# Patient Record
Sex: Male | Born: 1950
Health system: Southern US, Community
[De-identification: ages and names within clinical notes are randomized; demographics above are authoritative.]

## PROBLEM LIST (undated history)

## (undated) DIAGNOSIS — K5903 Drug induced constipation: Secondary | ICD-10-CM

## (undated) DIAGNOSIS — T402X5A Adverse effect of other opioids, initial encounter: Secondary | ICD-10-CM

## (undated) DIAGNOSIS — G4733 Obstructive sleep apnea (adult) (pediatric): Secondary | ICD-10-CM

## (undated) DIAGNOSIS — K219 Gastro-esophageal reflux disease without esophagitis: Secondary | ICD-10-CM

## (undated) DIAGNOSIS — I10 Essential (primary) hypertension: Secondary | ICD-10-CM

## (undated) DIAGNOSIS — M71129 Other infective bursitis, unspecified elbow: Secondary | ICD-10-CM

## (undated) DIAGNOSIS — R269 Unspecified abnormalities of gait and mobility: Secondary | ICD-10-CM

## (undated) DIAGNOSIS — I4891 Unspecified atrial fibrillation: Secondary | ICD-10-CM

## (undated) DIAGNOSIS — M1711 Unilateral primary osteoarthritis, right knee: Secondary | ICD-10-CM

## (undated) DIAGNOSIS — E7800 Pure hypercholesterolemia, unspecified: Secondary | ICD-10-CM

## (undated) DIAGNOSIS — J439 Emphysema, unspecified: Secondary | ICD-10-CM

## (undated) HISTORY — PX: MULTIPLE TOOTH EXTRACTIONS: SHX2053

## (undated) HISTORY — PX: KNEE SURGERY: SHX244

## (undated) HISTORY — PX: EYE SURGERY: SHX253

## (undated) HISTORY — PX: SHOULDER ARTHROSCOPY: SHX128

## (undated) HISTORY — DX: Gastro-esophageal reflux disease without esophagitis: K21.9

## (undated) HISTORY — DX: Unspecified abnormalities of gait and mobility: R26.9

## (undated) HISTORY — DX: Emphysema, unspecified: J43.9

## (undated) HISTORY — PX: COLONOSCOPY W/ BIOPSIES AND POLYPECTOMY: SHX1376

## (undated) HISTORY — DX: Obstructive sleep apnea (adult) (pediatric): G47.33

## (undated) HISTORY — DX: Pure hypercholesterolemia, unspecified: E78.00

## (undated) HISTORY — DX: Essential (primary) hypertension: I10

---

## 1998-01-22 ENCOUNTER — Ambulatory Visit: Admission: RE | Admit: 1998-01-22 | Discharge: 1998-01-22 | Payer: Self-pay | Admitting: Family Medicine

## 1999-06-05 ENCOUNTER — Emergency Department (HOSPITAL_COMMUNITY): Admission: EM | Admit: 1999-06-05 | Discharge: 1999-06-05 | Payer: Self-pay | Admitting: Internal Medicine

## 2003-01-14 ENCOUNTER — Ambulatory Visit (HOSPITAL_COMMUNITY): Admission: RE | Admit: 2003-01-14 | Discharge: 2003-01-14 | Payer: Self-pay | Admitting: Orthopedic Surgery

## 2003-01-14 ENCOUNTER — Encounter (INDEPENDENT_AMBULATORY_CARE_PROVIDER_SITE_OTHER): Payer: Self-pay | Admitting: Specialist

## 2003-01-14 ENCOUNTER — Encounter: Payer: Self-pay | Admitting: Orthopedic Surgery

## 2003-09-07 ENCOUNTER — Ambulatory Visit (HOSPITAL_COMMUNITY): Admission: RE | Admit: 2003-09-07 | Discharge: 2003-09-07 | Payer: Self-pay | Admitting: Gastroenterology

## 2003-09-07 ENCOUNTER — Encounter (INDEPENDENT_AMBULATORY_CARE_PROVIDER_SITE_OTHER): Payer: Self-pay | Admitting: Specialist

## 2006-04-03 ENCOUNTER — Ambulatory Visit: Payer: Self-pay | Admitting: Internal Medicine

## 2006-04-05 ENCOUNTER — Ambulatory Visit: Payer: Self-pay

## 2006-04-05 ENCOUNTER — Encounter: Payer: Self-pay | Admitting: Cardiology

## 2006-05-11 ENCOUNTER — Ambulatory Visit: Payer: Self-pay | Admitting: Cardiology

## 2006-05-22 ENCOUNTER — Ambulatory Visit: Payer: Self-pay | Admitting: Internal Medicine

## 2006-05-24 ENCOUNTER — Ambulatory Visit (HOSPITAL_COMMUNITY): Admission: RE | Admit: 2006-05-24 | Discharge: 2006-05-24 | Payer: Self-pay | Admitting: Internal Medicine

## 2006-07-14 ENCOUNTER — Ambulatory Visit: Payer: Self-pay | Admitting: Internal Medicine

## 2006-07-14 ENCOUNTER — Observation Stay (HOSPITAL_COMMUNITY): Admission: EM | Admit: 2006-07-14 | Discharge: 2006-07-15 | Payer: Self-pay | Admitting: Emergency Medicine

## 2010-04-13 ENCOUNTER — Encounter: Admission: RE | Admit: 2010-04-13 | Discharge: 2010-04-13 | Payer: Self-pay | Admitting: Family Medicine

## 2010-06-03 ENCOUNTER — Encounter: Admission: RE | Admit: 2010-06-03 | Discharge: 2010-06-03 | Payer: Self-pay | Admitting: Orthopedic Surgery

## 2010-07-23 HISTORY — PX: HERNIA REPAIR: SHX51

## 2010-08-11 ENCOUNTER — Inpatient Hospital Stay (HOSPITAL_COMMUNITY): Admission: EM | Admit: 2010-08-11 | Discharge: 2010-08-12 | Payer: Self-pay | Admitting: Emergency Medicine

## 2010-08-23 HISTORY — PX: TOTAL HIP ARTHROPLASTY: SHX124

## 2010-08-29 ENCOUNTER — Inpatient Hospital Stay (HOSPITAL_COMMUNITY): Admission: RE | Admit: 2010-08-29 | Discharge: 2010-08-31 | Payer: Self-pay | Admitting: Orthopedic Surgery

## 2011-01-03 LAB — COMPREHENSIVE METABOLIC PANEL
ALT: 34 U/L (ref 0–53)
AST: 21 U/L (ref 0–37)
Albumin: 3.7 g/dL (ref 3.5–5.2)
Alkaline Phosphatase: 142 U/L — ABNORMAL HIGH (ref 39–117)
BUN: 14 mg/dL (ref 6–23)
CO2: 29 mEq/L (ref 19–32)
Calcium: 9.2 mg/dL (ref 8.4–10.5)
Chloride: 102 mEq/L (ref 96–112)
Creatinine, Ser: 0.83 mg/dL (ref 0.4–1.5)
GFR calc Af Amer: >60 mL/min (ref >60)
GFR calc non Af Amer: >60 mL/min (ref >60)
Glucose, Bld: 112 mg/dL — ABNORMAL HIGH (ref 70–99)
Potassium: 4.2 mEq/L (ref 3.5–5.1)
Sodium: 138 mEq/L (ref 135–145)
Total Bilirubin: 0.7 mg/dL (ref 0.3–1.2)
Total Protein: 6.6 g/dL (ref 6.0–8.3)

## 2011-01-03 LAB — URINE MICROSCOPIC-ADD ON

## 2011-01-03 LAB — CBC
HCT: 32.2 % — ABNORMAL LOW (ref 39.0–52.0)
HCT: 36.1 % — ABNORMAL LOW (ref 39.0–52.0)
HCT: 39.6 % (ref 39.0–52.0)
Hemoglobin: 10.5 g/dL — ABNORMAL LOW (ref 13.0–17.0)
Hemoglobin: 11.7 g/dL — ABNORMAL LOW (ref 13.0–17.0)
Hemoglobin: 13.4 g/dL (ref 13.0–17.0)
MCH: 31 pg (ref 26.0–34.0)
MCH: 31 pg (ref 26.0–34.0)
MCH: 32.1 pg (ref 26.0–34.0)
MCHC: 32.4 g/dL (ref 30.0–36.0)
MCHC: 32.6 g/dL (ref 30.0–36.0)
MCHC: 33.8 g/dL (ref 30.0–36.0)
MCV: 94.7 fL (ref 78.0–100.0)
MCV: 95 fL (ref 78.0–100.0)
MCV: 95.8 fL (ref 78.0–100.0)
Platelets: 276 10*3/uL (ref 150–400)
Platelets: 299 10*3/uL (ref 150–400)
Platelets: 346 10*3/uL (ref 150–400)
RBC: 3.39 MIL/uL — ABNORMAL LOW (ref 4.22–5.81)
RBC: 3.77 MIL/uL — ABNORMAL LOW (ref 4.22–5.81)
RBC: 4.18 MIL/uL — ABNORMAL LOW (ref 4.22–5.81)
RDW: 13.1 % (ref 11.5–15.5)
RDW: 13.4 % (ref 11.5–15.5)
RDW: 13.5 % (ref 11.5–15.5)
WBC: 10.7 10*3/uL — ABNORMAL HIGH (ref 4.0–10.5)
WBC: 13.9 10*3/uL — ABNORMAL HIGH (ref 4.0–10.5)
WBC: 14.6 10*3/uL — ABNORMAL HIGH (ref 4.0–10.5)

## 2011-01-03 LAB — URINE CULTURE
Colony Count: NO GROWTH
Colony Count: NO GROWTH
Culture  Setup Time: 201111012156
Culture  Setup Time: 201111081650
Culture: NO GROWTH
Culture: NO GROWTH

## 2011-01-03 LAB — DIFFERENTIAL
Basophils Absolute: 0 10*3/uL (ref 0.0–0.1)
Basophils Absolute: 0.1 10*3/uL (ref 0.0–0.1)
Basophils Relative: 0 % (ref 0–1)
Basophils Relative: 1 % (ref 0–1)
Eosinophils Absolute: 0 10*3/uL (ref 0.0–0.7)
Eosinophils Absolute: 0.2 10*3/uL (ref 0.0–0.7)
Eosinophils Relative: 0 % (ref 0–5)
Eosinophils Relative: 2 % (ref 0–5)
Lymphocytes Relative: 11 % — ABNORMAL LOW (ref 12–46)
Lymphocytes Relative: 25 % (ref 12–46)
Lymphs Abs: 1.5 10*3/uL (ref 0.7–4.0)
Lymphs Abs: 2.7 10*3/uL (ref 0.7–4.0)
Monocytes Absolute: 0.7 10*3/uL (ref 0.1–1.0)
Monocytes Absolute: 1.7 10*3/uL — ABNORMAL HIGH (ref 0.1–1.0)
Monocytes Relative: 12 % (ref 3–12)
Monocytes Relative: 7 % (ref 3–12)
Neutro Abs: 10.7 10*3/uL — ABNORMAL HIGH (ref 1.7–7.7)
Neutro Abs: 7 10*3/uL (ref 1.7–7.7)
Neutrophils Relative %: 66 % (ref 43–77)
Neutrophils Relative %: 77 % (ref 43–77)

## 2011-01-03 LAB — BASIC METABOLIC PANEL
BUN: 10 mg/dL (ref 6–23)
BUN: 18 mg/dL (ref 6–23)
CO2: 27 mEq/L (ref 19–32)
CO2: 29 mEq/L (ref 19–32)
Calcium: 8.4 mg/dL (ref 8.4–10.5)
Calcium: 8.6 mg/dL (ref 8.4–10.5)
Chloride: 101 mEq/L (ref 96–112)
Chloride: 103 mEq/L (ref 96–112)
Creatinine, Ser: 0.77 mg/dL (ref 0.4–1.5)
Creatinine, Ser: 1.22 mg/dL (ref 0.4–1.5)
GFR calc Af Amer: >60 mL/min (ref >60)
GFR calc Af Amer: >60 mL/min (ref >60)
GFR calc non Af Amer: >60 mL/min (ref >60)
GFR calc non Af Amer: >60 mL/min (ref >60)
Glucose, Bld: 139 mg/dL — ABNORMAL HIGH (ref 70–99)
Glucose, Bld: 142 mg/dL — ABNORMAL HIGH (ref 70–99)
Potassium: 3.9 mEq/L (ref 3.5–5.1)
Potassium: 4 mEq/L (ref 3.5–5.1)
Sodium: 138 mEq/L (ref 135–145)
Sodium: 138 mEq/L (ref 135–145)

## 2011-01-03 LAB — URINALYSIS, ROUTINE W REFLEX MICROSCOPIC
Glucose, UA: NEGATIVE mg/dL
Glucose, UA: NEGATIVE mg/dL
Hgb urine dipstick: NEGATIVE
Hgb urine dipstick: NEGATIVE
Ketones, ur: NEGATIVE mg/dL
Ketones, ur: NEGATIVE mg/dL
Leukocytes, UA: NEGATIVE
Nitrite: NEGATIVE
Nitrite: NEGATIVE
Protein, ur: 30 mg/dL — AB
Protein, ur: NEGATIVE mg/dL
Specific Gravity, Urine: 1.026 (ref 1.005–1.030)
Specific Gravity, Urine: 1.027 (ref 1.005–1.030)
Urobilinogen, UA: 1 mg/dL (ref 0.0–1.0)
Urobilinogen, UA: 1 mg/dL (ref 0.0–1.0)
pH: 5.5 (ref 5.0–8.0)
pH: 6 (ref 5.0–8.0)

## 2011-01-03 LAB — SURGICAL PCR SCREEN
MRSA, PCR: NEGATIVE
Staphylococcus aureus: NEGATIVE

## 2011-01-03 LAB — TYPE AND SCREEN
ABO/RH(D): A POS
Antibody Screen: NEGATIVE

## 2011-01-03 LAB — PROTIME-INR
INR: 0.96 (ref 0.00–1.49)
Prothrombin Time: 13 seconds (ref 11.6–15.2)

## 2011-01-03 LAB — ABO/RH: ABO/RH(D): A POS

## 2011-01-03 LAB — APTT: aPTT: 34 seconds (ref 24–37)

## 2011-01-04 LAB — DIFFERENTIAL
Basophils Absolute: 0.1 10*3/uL (ref 0.0–0.1)
Basophils Relative: 1 % (ref 0–1)
Eosinophils Absolute: 0.1 10*3/uL (ref 0.0–0.7)
Eosinophils Relative: 1 % (ref 0–5)
Lymphocytes Relative: 19 % (ref 12–46)
Lymphs Abs: 1.8 10*3/uL (ref 0.7–4.0)
Monocytes Absolute: 0.6 10*3/uL (ref 0.1–1.0)
Monocytes Relative: 7 % (ref 3–12)
Neutro Abs: 6.8 10*3/uL (ref 1.7–7.7)
Neutrophils Relative %: 72 % (ref 43–77)

## 2011-01-04 LAB — COMPREHENSIVE METABOLIC PANEL
ALT: 51 U/L (ref 0–53)
AST: 22 U/L (ref 0–37)
Albumin: 3.6 g/dL (ref 3.5–5.2)
Alkaline Phosphatase: 110 U/L (ref 39–117)
BUN: 19 mg/dL (ref 6–23)
CO2: 28 mEq/L (ref 19–32)
Calcium: 9.2 mg/dL (ref 8.4–10.5)
Chloride: 106 mEq/L (ref 96–112)
Creatinine, Ser: 0.75 mg/dL (ref 0.4–1.5)
GFR calc Af Amer: >60 mL/min (ref >60)
GFR calc non Af Amer: >60 mL/min (ref >60)
Glucose, Bld: 102 mg/dL — ABNORMAL HIGH (ref 70–99)
Potassium: 3.9 mEq/L (ref 3.5–5.1)
Sodium: 141 mEq/L (ref 135–145)
Total Bilirubin: 0.8 mg/dL (ref 0.3–1.2)
Total Protein: 7 g/dL (ref 6.0–8.3)

## 2011-01-04 LAB — BASIC METABOLIC PANEL
BUN: 20 mg/dL (ref 6–23)
CO2: 29 mEq/L (ref 19–32)
Calcium: 8.4 mg/dL (ref 8.4–10.5)
Chloride: 105 mEq/L (ref 96–112)
Creatinine, Ser: 0.88 mg/dL (ref 0.4–1.5)
GFR calc Af Amer: >60 mL/min (ref >60)
GFR calc non Af Amer: >60 mL/min (ref >60)
Glucose, Bld: 98 mg/dL (ref 70–99)
Potassium: 3.7 mEq/L (ref 3.5–5.1)
Sodium: 138 mEq/L (ref 135–145)

## 2011-01-04 LAB — CBC
HCT: 34.8 % — ABNORMAL LOW (ref 39.0–52.0)
HCT: 39.5 % (ref 39.0–52.0)
Hemoglobin: 12 g/dL — ABNORMAL LOW (ref 13.0–17.0)
Hemoglobin: 13.9 g/dL (ref 13.0–17.0)
MCH: 32.5 pg (ref 26.0–34.0)
MCH: 32.7 pg (ref 26.0–34.0)
MCHC: 34.6 g/dL (ref 30.0–36.0)
MCHC: 35.2 g/dL (ref 30.0–36.0)
MCV: 92.9 fL (ref 78.0–100.0)
MCV: 94 fL (ref 78.0–100.0)
Platelets: 222 10*3/uL (ref 150–400)
Platelets: 260 10*3/uL (ref 150–400)
RBC: 3.7 MIL/uL — ABNORMAL LOW (ref 4.22–5.81)
RBC: 4.25 MIL/uL (ref 4.22–5.81)
RDW: 13 % (ref 11.5–15.5)
RDW: 13.1 % (ref 11.5–15.5)
WBC: 8.9 10*3/uL (ref 4.0–10.5)
WBC: 9.3 10*3/uL (ref 4.0–10.5)

## 2011-03-10 NOTE — Assessment & Plan Note (Signed)
[facility]                              CARDIOLOGY OFFICE NOTE   Richard, Lynch                      MRN:          [id number]  DATE:05/11/2006                            DOB:          01-13-1951    SUBJECTIVE:  [name] is a very pleasant 60 year old male patient  initially seen by Dr. Gala Lynch on April 03, 2006, with complaints of  palpitation.  The patient has had an extensive workup since he saw Dr.  Gala Lynch June 12.  He had an echocardiogram done June 14 that was normal.  He had an EF of 55%.  No significant valvular abnormalities.  He also had  stress Myoview study.  He walked for 11-1/2 minutes, and there was no  ischemia on his nuclear images.  He also had an event monitor that had no  significant arrhythmias noted.  He recently had another episode of  palpitations this past weekend, and he saw Dr. Foy Lynch on Monday.  Serum  catecholamines were drawn, and these were normal.   The patient describes episode of palpitations this past weekend that lasted  about an hour when he was pretty much at rest.  He felt that his heart was  going fast, but he did not check his pulse.  He denies any syncope.  He was  somewhat lightheaded.  His blood pressure was checked and it was elevated in  the 150's systolically.  He denies any chest pain or shortness of breath.  He noted another episode later that day after eating.  When he saw Dr.  Foy Lynch, he was placed on Toprol 25 mg daily.  He has not had any further  episodes of palpitations, but has noted several episodes of lightheadedness  that he describes as near syncope.  These are symptoms that last only  briefly, and he feels a tingling sensation across his upper chest and arms.  He notes that eating usually makes the symptoms worse.  He denies any  fevers, chills, melena, hematochezia, hematuria, nausea, vomiting, diarrhea,  rashes, myalgias, arthralgias.   MEDICATIONS:  1.  Aspirin 81 mg  daily.  2.  Lipitor 20 mg daily.  3.  Multivitamin daily.  4.  Vitamin E daily.  5.  Toprol XL 50 mg half tablet daily.   ALLERGIES:  No known drug allergies.   PHYSICAL EXAMINATION:  GENERAL APPEARANCE:  Well-nourished, well-developed  male in no acute distress.  VITAL SIGNS:  Blood pressure lying is 133/80, pulse 78 sitting, 134/80 with  pulse of 70 standing, 139/80 with pulse of 78 every 2 minutes; 132/81, pulse  79, every 5 minutes; 130/82, pulse 81.  HEENT:  Unremarkable.  NECK:  Without JVD.  CARDIAC:  Normal S1, S2.  Regular rate and rhythm without murmurs, clicks,  rubs or gallops.  LUNGS:  Clear to auscultation without wheezes, rales or rhonchi.  ABDOMEN:  Soft, nontender with normoactive bowel sounds.  No organomegaly.  No rebound or guarding.  EXTREMITIES:  Without edema.  Calves are soft, nontender, without palpable  cords.  NEUROLOGICAL:  Nonfocal.   STUDIES:  Electrocardiogram revealed  sinus rhythm with a heart rate of 78,  normal axis.  No acute changes.   IMPRESSION:  1.  Palpitation and presyncope.      1.  Normal LV function.      2.  Nonischemic stress Myoview June 2007.      3.  No significant arrhythmia by recent event monitor.  2.  Hypertension.  3.  Treated dyslipidemia.  4.  Tobacco abuse.   FAMILY HISTORY:  Coronary disease.   PLAN:  The patient presents to the office today again with episodes of  palpitations.  Some of these symptoms are somewhat vague.  He did have two  episodes of palpitations that seem to come on with eating this past weekend.  It does not sound as though he has hyperglycemic episodes as eating makes  his symptoms worse.  He does have a lot of belching with some of the  symptoms that he describes, but no indigestion, water brash symptoms or  belching throughout the rest of the day.  He had recent serum catecholamines  drawn and these were normal.  He also had a TSH done and this was normal.  To this point, his workup has been  fairly unremarkable.  His exam is benign.  I think, at this point in time, we should increase his Toprol to 50 mg daily  to see if he can tolerate that and have less symptoms.  If he continues to  have symptoms, we can certainly try to put him on a pro time pump inhibitor  as he does describe some belching.  We are going to hold off on the proton  pump inhibitor at this point in time.  The patient sees Dr. Gala Lynch in  about one week's time, and she should keep that appointment.  He knows to go  to the emergency room or come to our office directly if he should have  another episode of these palpitations that he has described.                                  Tereso Newcomer, PA-C    SW/MedQ  DD:  05/11/2006  DT:  05/11/2006  Job #:  [id number]   cc:   Richard Maduro L. Richard Guadalajara, MD

## 2011-03-10 NOTE — Op Note (Signed)
   NAME:  Richard Lynch, Richard Lynch                         ACCOUNT NO.:  [id number]   MEDICAL RECORD NO.:  [id number]                   PATIENT TYPE:  AMB   LOCATION:  DAY                                  FACILITY:  [facility]   PHYSICIAN:  Marlowe Kays, M.D.               DATE OF BIRTH:  [birth year]   DATE OF PROCEDURE:  01/14/2003  DATE OF DISCHARGE:                                 OPERATIVE REPORT   PREOPERATIVE DIAGNOSIS:  Chronic olecranon bursitis, right elbow.   POSTOPERATIVE DIAGNOSIS:  Chronic olecranon bursitis, right elbow.   PROCEDURE:  Excision of olecranon bursa, right elbow.   SURGEON:  Marlowe Kays, M.D.   ASSISTANT:  Nurse.   ANESTHESIA:  General.   PATHOLOGY AND JUSTIFICATION FOR PROCEDURE:  Roughly a year ago he struck the  right olecranon on a door jamb and since that time has had chronic pain and  swelling and has noted nodularity in the right olecranon bursa.  He has had  one aspiration and steroid injection with no improvement.  Accordingly, he  is here today for surgery.   DESCRIPTION OF PROCEDURE:  After satisfactory general anesthesia, a neck  tourniquet applied.  The right elbow was prepped with Duraprep from wrist to  tourniquet and draped in a sterile field.  The arm was placed across his  chest.  A curved incision was made around the olecranon on the radial side.  On opening the joint, a large amount of fluid was present plus a large  amount of ropy, fibrotic material, some of which was intimately attached to  the undersurface of the skin.  I excised all his olecranon bursa with a  combination of knife and scissors dissection, and the nodules which were  adherent to the underneath surface of the skin I dissected off with a 15  knife blade.  The specimen was sent to pathology.  I then released the  tourniquet and the bleeders were coagulated.  The wound was dry on closure.  I did not feel he needed a drain.  I irrigated with sterile saline, and the  skin  and subcutaneous tissue were only closed with interrupted 4-0 nylon  interrupted stitches.  Betadine and Adaptic compressive dressing were  applied.  He tolerated the procedure well and was taken to the recovery room  in satisfactory condition with no known complications.                                                Marlowe Kays, M.D.    JA/MEDQ  D:  01/14/2003  T:  01/15/2003  Job:  [id number]

## 2011-03-10 NOTE — H&P (Signed)
NAME:  Richard Lynch, Richard Lynch               ACCOUNT NO.:  [id number]   MEDICAL RECORD NO.:  [id number]          PATIENT TYPE:  OBV   LOCATION:  3733                         FACILITY:  [facility]   PHYSICIAN:  Doylene Canning. Ladona Ridgel, MD    DATE OF BIRTH:  20-Apr-1951   DATE OF ADMISSION:  07/14/2006  DATE OF DISCHARGE:  07/15/2006                                HISTORY & PHYSICAL   ADMITTING DIAGNOSES:  1. Palpitations.  2. Chest pain.   HISTORY OF PRESENT ILLNESS:  The patient is a 60 year old man who has been  followed by Dr. Nicholes Lynch.  He has had palpitations which have been  present since June or July and has undergone extensive workup.  He wore a 30-  day cardiac monitor, by his report, which was unremarkable.  He has had  extensive test including stress testing, MRI scanning and most recently,  ultrasound of the abdominal and by his report, all of these have been  negative.  The patient was in his usual state of health until this morning,  when he was awakened from sleep by the sensation of rapid heart racing  associated with chest pressure and shortness of breath.  He felt warm and  hot throughout his chest..  He took a beta blocker and came to the emergency  room.  In the ambulance, he was in sinus rhythm, based on an EKG with no  significant ST-T abnormalities.  His subsequent EKGs have been all  unremarkable.  He is subsequently referred for additional evaluation and  treatment.  The patient has never had syncope.  He denies cough or  hemoptysis.  He does feel generalized fatigue, particularly after one of his  episodes of palpitations.  His chest pain does not radiate to the arm or  neck.   SOCIAL HISTORY:  The patient has history as heavy tobacco use, but now  smokes less than a half a pack of cigarettes daily, prior to this, 1 to 1-  1/2 packs per day.  He drinks 2-4 alcoholic beverage daily.  He is married.   FAMILY HISTORY:  Notable for both parents being alive, both with heart  problems in their mid 66s.   REVIEW OF SYSTEMS:  As noted in the HPI.  The rest of the review of systems  was negative.   MEDICATIONS:  Include Toprol and Lipitor.   PHYSICAL EXAMINATION:  GENERAL:  He is a pleasant, well-appearing, middle-  aged man in no acute distress.  VITAL SIGNS:  The blood pressure today was 117/80.  The pulse was 78 and  regular.  The respirations were 18.  HEENT:  Normocephalic and atraumatic.  Pupils are equal and round.  The oropharynx is moist.  Sclerae were  anicteric.  NECK:  No jugular distention.  There was no thyromegaly.  Trachea was  midline.  The carotids were 2+ symmetric.  LUNGS:  Clear bilaterally to  auscultation.  There were no wheezes, rales or rhonchi.  There was no  increased work of breathing.  CARDIOVASCULAR:  Exam reveals a regular rate and rhythm with normal S1 and  S2.  There were no murmurs, rubs or gallops.  The PMI was not enlarged, nor  was it laterally displaced.  ABDOMEN:  Soft and nontender.  There was no organomegaly.  The bowel sounds  were present.  There was no rebound or guarding.  EXTREMITIES:  Demonstrated no cyanosis, clubbing or edema.  The pulses were  2+ and symmetric.Marland Kitchen  SKIN:  Exam was normal.  JOINTS:  Exam was normal.  NEUROLOGIC:  Alert and oriented x3 with nerves intact.  His strength was 5/5  and symmetric.   LABORATORY AND ACCESSORY CLINICAL DATA:  His EKG demonstrates sinus rhythm  with normal axis and left atrial enlargement.   IMPRESSION:  1. Palpitations.  2. Atypical chest pain.  3. Dyslipidemia.   DISCUSSION:  The etiology of Richard Lynch symptoms are still unclear to me.  I plan to admit him to the hospital and obtain serial cardiac enzymes  secondary to his chest pain, though this is atypical.  He has some evidence  of anxiety; however, I think that his symptoms that have awakened him from  sleep goes against this purely being anxiety.  Most likely he either has SVT  or atrial fibrillation as  a mechanism of his underlying palpitations.  The  patient has worn a 30-day cardiac monitor in the past, but his symptoms have  increased in frequency and severity since then and we may have him repeat  this or have him alternatively wear a 48-hour Holter if nothing shows up on  telemetry while he is in the hospital.           ______________________________  Doylene Canning. Ladona Ridgel, MD     GWT/MEDQ  D:  07/14/2006  T:  07/17/2006  Job:  [id number]   cc:   Bevelyn Buckles. Bensimhon, MD

## 2011-03-10 NOTE — Op Note (Signed)
   NAME:  Richard Lynch, Richard Lynch                         ACCOUNT NO.:  [id number]   MEDICAL RECORD NO.:  [id number]                   PATIENT TYPE:  AMB   LOCATION:  ENDO                                 FACILITY:  [facility]   PHYSICIAN:  Petra Kuba, M.D.                 DATE OF BIRTH:  02/27/1951   DATE OF PROCEDURE:  09/07/2003  DATE OF DISCHARGE:                                 OPERATIVE REPORT   PROCEDURE PERFORMED:  Colonoscopy with polypectomy.   ENDOSCOPIST:  Petra Kuba, M.D.   INDICATIONS FOR PROCEDURE:  Patient with family history of colon cancer due  for screening.  Consent was signed after the risks, benefits, methods and  options were thoroughly discussed in the office.   MEDICINES USED:  Demerol 50 mg, Versed 5 mg.   DESCRIPTION OF PROCEDURE:  Rectal inspection was pertinent for external  hemorrhoids, small.  Digital exam was negative.  A video pediatric  adjustable colonoscope was inserted and easily advanced around the colon to  the cecum.  This did not require any abdominal pressure or any position  changes.  No abnormality seen on insertion.  The cecum was identified by the  appendiceal orifice and the ileocecal valve.  The prep was adequate.  There  was some liquid stool that required washing and suctioning.  On slow  withdrawal through the colon, two tiny polyps were seen, one in the more  proximal transverse, one in the distal sigmoid, both were hot biopsied and  put in the same container.  No other abnormalities were seen as we slowly  withdrew back to the rectum.  Anorectal pullthrough and retroflexion  confirmed some small hemorrhoids.  Scope was straightened and readvanced a  short ways up the left side of the colon.  Air was suctioned, scope removed.  The patient tolerated the procedure well.  There was no immediate obvious  complication.   ENDOSCOPIC DIAGNOSIS:  1. Internal and external hemorrhoids.  2. Two tiny distal sigmoid and midtransverse polyps hot  biopsied.  3. Otherwise within normal limits to the cecum.   PLAN:  Await pathology to determine future colonic screening.  Happy to see  back p.r.n.  Otherwise return care to Dr. Foy Guadalajara for the customary health  care maintenance to include yearly rectals and guaiacs.  Probably recheck  colon screening in five years.                                               Petra Kuba, M.D.   MEM/MEDQ  D:  09/07/2003  T:  09/07/2003  Job:  [id number]   cc:   Molly Maduro L. Foy Guadalajara, M.D.  [address] [place]  [place] [address]  Fax: [phone]

## 2011-03-10 NOTE — Assessment & Plan Note (Signed)
[facility]                              CARDIOLOGY OFFICE NOTE   SHAHIR, KAREN                      MRN:          [id number]  DATE:05/22/2006                            DOB:          1951/06/28    PRIMARY CARE PHYSICIAN:  Molly Maduro L. Foy Guadalajara, M.D., [facility].   PATIENT IDENTIFICATION:  Richard Lynch is a very pleasant 60 year old male, who  returns for routine followup.   PROBLEM LIST:  1.  Palpitations and presyncope.      1.  Echocardiogram, stress test and loop monitor are all negative.      2.  Urine catecholamines negative.  2.  Tobacco use, ongoing.  3.  Heavy caffeine use.  4.  Hyperlipidemia.  5.  Hypertension.   CURRENT MEDICATIONS:  1.  Aspirin 81 mg a day.  2.  Lipitor 20 a day.  3.  Multivitamin, vitamin E and Toprol XL 50 mg a day.   INTERVAL HISTORY:  Richard Lynch returns for routine followup.  He was seen by  Tereso Newcomer in our clinic several weeks ago for recurrent severe  palpitations.  His Toprol was increased to 50 mg a day.  Since that time, he  has gone on a family vacation in [place].  He says the palpitations have been  much decreased.  He has only had two very minor episodes of light-headedness  over the past few weeks.  He continues to drink caffeine.  He is somewhat  worried that possibly the symptoms could be coming from blockages in his  neck.   PHYSICAL EXAMINATION:  GENERAL:  He is well-appearing, in no acute distress,  ambulates around the clinic without any difficulty.  Respirations are  unlabored.  VITAL SIGNS:  Blood pressure is 124/92, heart rate 76, his weight is 187.  HEENT:  Sclerae anicteric. EOMI.  There are no xanthelasmas.  Mucous  membranes are moist.  NECK:  The neck is supple, no JVD.  Carotids are 2+ bilaterally without any  bruits.  There is no lymphadenopathy or thyromegaly.  CARDIAC:  Regular rate and rhythm, no murmurs, rubs or gallops.  LUNGS:  Clear.  ABDOMEN:  Soft,  nontender, nondistended, no hepatosplenomegaly, no bruits,  no masses.  EXTREMITIES:  Warm with no clubbing, cyanosis or edema.  NEUROLOGIC:  He is alert and oriented times three.  Cranial nerves II  through XII are intact.  He moves all four extremities without difficulty.   The EKG shows normal sinus rhythm at a rate of 76.  No STT-wave changes,  normal axis, no evidence of pre-excitation.   ASSESSMENT AND PLAN:  1.  Palpitations and presyncope.  Symptoms have much improved with increase      in his beta blocker.  I suspect he has an SVT and probably an AVNRT,      although we have not been able to capture an episode on that monitor.  I      have instructed him to continue with his beta blocker and he can even      increase to 100 mg a  day as needed.  I also advised him to avoid all      caffeine.  Should he continue to have significant symptoms, we can      either follow up with another event monitor or consider a referral for      EP for an implantable loop recorder.  Given his concern about possible      intracranial stenosis, we will go ahead and order MRI/MRA to clearly      evaluate.  2.  Hyperlipidemia, as per Dr. Foy Guadalajara.  3.  Hypertension.  Now blood pressure is well-controlled.   DISPOSITION:  Return to clinic in six months or sooner, as needed.                                Bevelyn Buckles. Bensimhon, MD    DRB/MedQ  DD:  05/22/2006  DT:  05/22/2006  Job #:  [id number]

## 2011-03-10 NOTE — Discharge Summary (Signed)
NAME:  Richard Lynch, Richard Lynch               ACCOUNT NO.:  [id number]   MEDICAL RECORD NO.:  [id number]          PATIENT TYPE:  OBV   LOCATION:  3733                         FACILITY:  [facility]   PHYSICIAN:  Doylene Canning. Ladona Ridgel, MD    DATE OF BIRTH:  1950-10-27   DATE OF ADMISSION:  07/14/2006  DATE OF DISCHARGE:  07/15/2006                                 DISCHARGE SUMMARY   PRIMARY CARDIOLOGIST:  Dr. Arvilla Meres.   PRIMARY CARE PHYSICIAN:  Dr. Marinda Elk.   PRINCIPAL DIAGNOSIS:  Nonsustained SVT/atrial tachycardia.   SECONDARY DIAGNOSES:  1. History of palpitations and presyncope.  2. Hyperlipidemia.  3. Tobacco abuse.  4. History of atypical chest pain.  5. History of heavy caffeine use.  6. Hyperlipidemia.  7. Hypertension.   ALLERGIES:  NO KNOWN DRUG ALLERGIES.   PROCEDURES:  None.   HISTORY OF PRESENT ILLNESS:  A 60 year old male with prior history of  palpitations, presyncope and atypical chest pain who presented to the ED on  July 14, 2006, with complaints of being awakened from sleep with  palpitations and minor chest pain.  In the ED ,exam and EKG were normal and  cardiac markers were negative.  He is admitted for the rule out.   HOSPITAL COURSE:  [name] ruled out for MI by cardiac markers x4.  He was  noted on telemetry to have nonsustained atrial tachycardia, and he was seen  by Dr. Ladona Ridgel, and this was felt to be the cause of his palpitations.  He  has been advised to take his Toprol XL 100 mg daily as well as to avoid  caffeine and discontinue smoking.  He will be discharged home today in  satisfactory condition, and will follow with Dr. Gala Romney.  Dr. Ladona Ridgel will  have discussion with Dr. Gala Romney regarding use antiarrhythmic in this  patient in the future.   DISCHARGE LABORATORIES:  Hemoglobin 14.1, hematocrit 40.9, WBC 6.2,  platelets 266.  Sodium 140, potassium 3.8, chloride 105, CO2 28, BUN 12,  creatinine 0.8, glucose 100, total bilirubin 0.7,  alkaline phosphatase 50,  AST 18, ALT 26, total protein 6.0, albumin 3.5, calcium 8.8, CK 96, MB 1.1,  troponin I 0.01.   DISPOSITION:  The patient is being discharged home today in good condition.   FOLLOW-UP PLANS AND APPOINTMENTS:  we will arrange for follow-up with Dr.  Gala Romney in approximately 2 weeks.   DISCHARGE MEDICATIONS:  1. Aspirin 81 mg daily.  2. Lipitor 20 mg daily.  3. Toprol XL 100 mg daily  4. Vitamin D daily.   OUTSTANDING LABORATORIES TO DATE:  None.   DURATION DISCHARGE ENCOUNTER:  Thirty-five minutes including physician time.     ______________________________  Nicolasa Ducking, ANP    ______________________________  Doylene Canning. Ladona Ridgel, MD    CB/MEDQ  D:  07/15/2006  T:  07/17/2006  Job:  [id number]   cc:   Molly Maduro L. Foy Guadalajara, M.D.

## 2012-05-23 ENCOUNTER — Encounter: Payer: Self-pay | Admitting: Cardiology

## 2012-08-20 ENCOUNTER — Other Ambulatory Visit: Payer: Self-pay | Admitting: Family Medicine

## 2012-12-24 DIAGNOSIS — B0052 Herpesviral keratitis: Secondary | ICD-10-CM | POA: Insufficient documentation

## 2012-12-24 DIAGNOSIS — H25819 Combined forms of age-related cataract, unspecified eye: Secondary | ICD-10-CM | POA: Insufficient documentation

## 2012-12-24 DIAGNOSIS — L98499 Non-pressure chronic ulcer of skin of other sites with unspecified severity: Secondary | ICD-10-CM | POA: Insufficient documentation

## 2012-12-24 DIAGNOSIS — H5210 Myopia, unspecified eye: Secondary | ICD-10-CM | POA: Insufficient documentation

## 2014-06-30 DIAGNOSIS — R918 Other nonspecific abnormal finding of lung field: Secondary | ICD-10-CM | POA: Insufficient documentation

## 2014-08-14 ENCOUNTER — Other Ambulatory Visit (HOSPITAL_COMMUNITY): Payer: Self-pay

## 2014-08-24 ENCOUNTER — Encounter (HOSPITAL_COMMUNITY): Admission: RE | Payer: Self-pay | Source: Ambulatory Visit

## 2014-08-24 ENCOUNTER — Inpatient Hospital Stay (HOSPITAL_COMMUNITY): Admission: RE | Admit: 2014-08-24 | Payer: 59 | Source: Ambulatory Visit | Admitting: Orthopedic Surgery

## 2014-08-24 SURGERY — ARTHROPLASTY, KNEE, TOTAL
Anesthesia: General | Site: Knee | Laterality: Right

## 2014-10-27 ENCOUNTER — Other Ambulatory Visit: Payer: Self-pay | Admitting: Ophthalmology

## 2014-11-23 ENCOUNTER — Other Ambulatory Visit: Payer: Self-pay | Admitting: Ophthalmology

## 2014-11-23 DIAGNOSIS — H547 Unspecified visual loss: Secondary | ICD-10-CM

## 2014-12-09 ENCOUNTER — Ambulatory Visit
Admission: RE | Admit: 2014-12-09 | Discharge: 2014-12-09 | Disposition: A | Discharge status: Home / Self Care | Payer: 59 | Source: Ambulatory Visit | Attending: Ophthalmology | Admitting: Ophthalmology

## 2014-12-09 DIAGNOSIS — H547 Unspecified visual loss: Secondary | ICD-10-CM

## 2014-12-09 MED ORDER — GADOBENATE DIMEGLUMINE 529 MG/ML IV SOLN
20.0000 mL | Freq: Once | INTRAVENOUS | Status: AC | PRN
  Administered 2014-12-09: 20 mL via INTRAVENOUS

## 2014-12-29 DIAGNOSIS — D2310 Other benign neoplasm of skin of unspecified eyelid, including canthus: Secondary | ICD-10-CM | POA: Insufficient documentation

## 2014-12-29 DIAGNOSIS — H0230 Blepharochalasis unspecified eye, unspecified eyelid: Secondary | ICD-10-CM | POA: Insufficient documentation

## 2014-12-29 DIAGNOSIS — H02423 Myogenic ptosis of bilateral eyelids: Secondary | ICD-10-CM | POA: Insufficient documentation

## 2015-06-11 ENCOUNTER — Telehealth: Payer: Self-pay | Admitting: Acute Care

## 2015-06-11 ENCOUNTER — Other Ambulatory Visit: Payer: Self-pay | Admitting: Acute Care

## 2015-06-11 DIAGNOSIS — F17210 Nicotine dependence, cigarettes, uncomplicated: Principal | ICD-10-CM

## 2015-06-11 NOTE — Telephone Encounter (Signed)
I called and scheduled Richard Lynch for a lung cancer screening. He is scheduled for Sept. 8, 2016 at 10 am, and will have the CT at 11 am. He verbalized understanding of time and location of both appointments and has my contact information in the event he needs to get in touch with me with questions.

## 2015-06-24 DIAGNOSIS — M71129 Other infective bursitis, unspecified elbow: Secondary | ICD-10-CM

## 2015-06-24 HISTORY — DX: Other infective bursitis, unspecified elbow: M71.129

## 2015-07-01 ENCOUNTER — Ambulatory Visit (INDEPENDENT_AMBULATORY_CARE_PROVIDER_SITE_OTHER): Payer: 59 | Admitting: Acute Care

## 2015-07-01 ENCOUNTER — Encounter: Payer: Self-pay | Admitting: Acute Care

## 2015-07-01 ENCOUNTER — Ambulatory Visit (INDEPENDENT_AMBULATORY_CARE_PROVIDER_SITE_OTHER)
Admission: RE | Admit: 2015-07-01 | Discharge: 2015-07-01 | Disposition: A | Discharge status: Home / Self Care | Payer: 59 | Source: Ambulatory Visit | Attending: Acute Care | Admitting: Acute Care

## 2015-07-01 DIAGNOSIS — F17210 Nicotine dependence, cigarettes, uncomplicated: Secondary | ICD-10-CM | POA: Diagnosis not present

## 2015-07-01 NOTE — Progress Notes (Signed)
Shared Decision Making Visit Lung Cancer Screening Program [phone])   Eligibility:  Age 63 y.o.  Pack Years Smoking History Calculation 60 pack years (# packs/per year x # years smoked)  Recent History of coughing up blood  no  Unexplained weight loss? no ( >Than 15 pounds within the last 6 months )  Prior History Lung / other cancer no (Diagnosis within the last 5 years already requiring surveillance chest CT Scans).  Smoking Status Current Smoker  Former Smokers: Years since quit:NA  Quit Date: NA  Visit Components:  Discussion included one or more decision making aids. yes  Discussion included risk/benefits of screening. yes  Discussion included potential follow up diagnostic testing for abnormal scans. yes  Discussion included meaning and risk of over diagnosis. yes  Discussion included meaning and risk of False Positives. yes  Discussion included meaning of total radiation exposure. yes  Counseling Included:  Importance of adherence to annual lung cancer LDCT screening. yes  Impact of comorbidities on ability to participate in the program. yes  Ability and willingness to under diagnostic treatment. yes  Smoking Cessation Counseling:  Current Smokers:   Discussed importance of smoking cessation. yes  Information about tobacco cessation classes and interventions provided to patient. yes  Patient provided with "ticket" for LDCT Scan. yes  Symptomatic Patient. no  Counseling:NA  Diagnosis Code: Tobacco Use Z72.0  Asymptomatic Patient yes  Counseling (Intermediate counseling: > three minutes counseling) W4315  Former Smokers:   Discussed the importance of maintaining cigarette abstinence. NA  Diagnosis Code: Personal History of Nicotine Dependence. Q00.867  Information about tobacco cessation classes and interventions provided to patient.NA  Patient provided with "ticket" for LDCT Scan. NA  Written Order for Lung Cancer Screening with LDCT  placed in Epic. Yes (CT Chest Lung Cancer Screening Low Dose W/O CM) [id number] Z12.2-Screening of respiratory organs Z87.891-Personal history of nicotine dependence  I spent 15 minutes of face to face time with Mr. Neita Garnet discussing the risks and benefits of the Lung Cancer Screening Program. We viewed a power point together that detailed the above noted topics. We paused at intervals to allow for questions to be asked and answered to ensure understanding of all elements. We discussed that the single most powerful action that he could take to decrease his risk of lung cancer was to quit smoking. He verbalized understanding, but is not ready to quit. I have told him to call me when he is ready, and that I will help to make sure he has the tools to successfully reach his goal. I gave him the " Be stronger than your excuses" card with resources in the community and free nicotine replacement through [employer]. Additionally, I gave him a copy of the power point we viewed together and my card and contact information to refer to as needed in the future. He confirmed time and location of the scan appointment, and had no further questions upon leaving the office. He verbalized understanding of all of the above.  Magdalen Spatz, NP

## 2015-07-02 ENCOUNTER — Telehealth: Payer: Self-pay | Admitting: Acute Care

## 2015-07-02 NOTE — Telephone Encounter (Signed)
I called [name] to give him the results of his screening scan. I explained that the scan was read as a Lung RADS 3, probably benign findings, nodules with a low likelihood of becoming a clinically active cancer, short term follow up recommended. The recommendation was for a follow up scan in 6 months. I also explained that the radiologist noted that this nodule appeared to be post infectious or inflammatory, and that we would repeat the scan in 6 months to make sure that it is either unchanged or has shrunk. He verbalized understanding. I told him I would call him in 6 months to schedule the follow up exam. He verbalized understanding and had no further questions.He has my contact information in the event that he has any questions in the future.

## 2015-08-06 ENCOUNTER — Other Ambulatory Visit: Payer: Self-pay | Admitting: Gastroenterology

## 2015-08-24 HISTORY — PX: ELBOW SURGERY: SHX618

## 2015-11-02 ENCOUNTER — Ambulatory Visit (INDEPENDENT_AMBULATORY_CARE_PROVIDER_SITE_OTHER): Payer: 59 | Admitting: Pulmonary Disease

## 2015-11-02 ENCOUNTER — Other Ambulatory Visit: Payer: 59

## 2015-11-02 ENCOUNTER — Encounter: Payer: Self-pay | Admitting: Pulmonary Disease

## 2015-11-02 VITALS — BP 122/64 | HR 87 | Ht 71.0 in | Wt 211.2 lb

## 2015-11-02 DIAGNOSIS — G4733 Obstructive sleep apnea (adult) (pediatric): Secondary | ICD-10-CM

## 2015-11-02 DIAGNOSIS — J439 Emphysema, unspecified: Secondary | ICD-10-CM

## 2015-11-02 DIAGNOSIS — Z720 Tobacco use: Secondary | ICD-10-CM | POA: Diagnosis not present

## 2015-11-02 DIAGNOSIS — J438 Other emphysema: Secondary | ICD-10-CM

## 2015-11-02 DIAGNOSIS — E785 Hyperlipidemia, unspecified: Secondary | ICD-10-CM

## 2015-11-02 DIAGNOSIS — R918 Other nonspecific abnormal finding of lung field: Secondary | ICD-10-CM

## 2015-11-02 DIAGNOSIS — M1990 Unspecified osteoarthritis, unspecified site: Secondary | ICD-10-CM | POA: Insufficient documentation

## 2015-11-02 DIAGNOSIS — Z9989 Dependence on other enabling machines and devices: Secondary | ICD-10-CM

## 2015-11-02 DIAGNOSIS — I10 Essential (primary) hypertension: Secondary | ICD-10-CM | POA: Insufficient documentation

## 2015-11-02 DIAGNOSIS — K219 Gastro-esophageal reflux disease without esophagitis: Secondary | ICD-10-CM | POA: Insufficient documentation

## 2015-11-02 NOTE — Progress Notes (Signed)
Subjective:    Patient ID: Richard Lynch, male    DOB: Mar 20, 1951, 65 y.o.   MRN: [id number]  HPI The patient is planned for knee replacement in February.  He reports he has undergone general anesthesia multiple times with his most recent surgery was in November 2016 without any complications.  He denies any childhood history of asthma or bronchitis. He has smoked since 65 y.o. He denies any dyspnea. He does cough on a daily and it is productive of clear mucus but can be brown-green at times. He reports he gets bronchitis a couple of times a year and uses a Combivent inhaler intermittently. He is treated with antibiotics & steroids. Had pneumonia as a child. Denies any hemoptysis. Denies any wheezing. No chest pain or pressure. No fever, chills, or sweats. No emesis or diarrhea. Has nausea every morning at 10am that he attributes to taking medications on an empty stomach. No rashes but does bruise easily. No sinus congestion or pressure. Reports he is using his CPAP nightly religiously. He reports using it 6 hours a night. He feels his quality of sleep is better than off the machine but reports it is not as good as it previously was. He reports his machine is 74.63 years old. He reports he has gained 10-12 pounds in that time.    Review of Systems No adenopathy in his neck, groin, or axilla. No dysuria or hematuria. A pertinent 14 point review of systems is negative except as per the history of presenting illness.  No Known Allergies  Outpatient Encounter Prescriptions as of 11/02/2015  Medication Sig Note  . amphetamine-dextroamphetamine (ADDERALL) 20 MG tablet Once daily 11/02/2015: Received from: External Pharmacy Received Sig:   . atenolol (TENORMIN) 25 MG tablet Once daily 11/02/2015: Received from: External Pharmacy Received Sig:   . candesartan (ATACAND) 32 MG tablet Once daily 11/02/2015: Received from: External Pharmacy Received Sig:   . chlorthalidone (HYGROTON) 25 MG tablet Once daily  11/02/2015: Received from: External Pharmacy Received Sig:   . COMBIVENT RESPIMAT 20-100 MCG/ACT AERS respimat As needed 11/02/2015: Received from: External Pharmacy Received Sig:   . DEXILANT 60 MG capsule Once daily 11/02/2015: Received from: External Pharmacy Received Sig:   . fenofibrate 160 MG tablet Once daily 11/02/2015: Received from: External Pharmacy Received Sig:   . LIVALO 2 MG TABS Once daily 11/02/2015: Received from: External Pharmacy Received Sig:   . meloxicam (MOBIC) 15 MG tablet Once daily 11/02/2015: Received from: External Pharmacy Received Sig:   . valACYclovir (VALTREX) 500 MG tablet Once daily 11/02/2015: Received from: External Pharmacy Received Sig:    No facility-administered encounter medications on file as of 11/02/2015.    Past Medical History  Diagnosis Date  . Hypertension   . High cholesterol   . Emphysema lung ([place])   . OSA (obstructive sleep apnea)     on CPAP  . GERD (gastroesophageal reflux disease)     Past Surgical History  Procedure Laterality Date  . Elbow surgery  08/2015    bilateral  . Knee surgery      3 times on both knee's each  . Total hip arthroplasty  08/2010  . Hernia repair  07/2010    Family History  Problem Relation Age of Onset  . Emphysema Father   . Heart disease Father   . Heart disease Mother   . Colon cancer Mother   . Breast cancer Mother     Social History   Social History  . Marital  Status: Married    Spouse Name: N/A  . Number of Children: Y  . Years of Education: N/A   Occupational History  . owner of floor store    Social History Main Topics  . Smoking status: Current Every Day Smoker -- 1.50 packs/day    Types: Cigarettes    Start date: 01/24/1973  . Smokeless tobacco: None     Comment: Peak rate of 3ppd  . Alcohol Use: 0.0 oz/week    0 Standard drinks or equivalent per week     Comment: 6 drinks per week  . Drug Use: No  . Sexual Activity: Not Asked   Other Topics Concern  . None   Social  History Narrative   Originally from [place]. He has lived in [place] as well. He moved to [facility] in [place]. 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 He has bachelors degree in Business. Has a dog currently. No bird or mold exposure.       Objective:   Physical Exam BP 122/64 mmHg  Pulse 87  Ht 5\' 11"  (1.803 m)  Wt 211 lb 3.2 oz (95.8 kg)  BMI 29.47 kg/m2  SpO2 94% General:  Awake. Alert. No acute distress.   Integument:  Warm & dry. No rash on exposed skin. No bruising. Lymphatics:  No appreciated cervical or supraclavicular lymphadenoapthy. HEENT:  Moist mucus membranes. No oral ulcers. No scleral injection or icterus. PERRL. Cardiovascular:  Regular rate. No edema. No appreciable JVD.  Pulmonary:  Good aeration & clear to auscultation bilaterally. Symmetric chest wall expansion. No accessory muscle use. Abdomen: Soft. Normal bowel sounds. Protuberant. Grossly nontender. Musculoskeletal:  Normal bulk and tone. Hand grip strength 5/5 bilaterally. No joint deformity or effusion appreciated. Neurological:  CN 2-12 grossly in tact. No meningismus. Moving all 4 extremities equally. Symmetric brachioradialis deep tendon reflexes. Psychiatric:  Mood and affect congruent. Speech normal rhythm, rate & tone.   IMAGING LD CT CHEST W/O 07/01/15 (personally reviewed by me): Apical predominant emphysematous changes with subpleural blebs. 7 mm nodule with associated linear opacity consistent with scar in left lower lobe. Subcentimeter nodules noted otherwise. No pleural effusion or thickening. No pathologic mediastinal adenopathy. No pericardial effusion.    Assessment & Plan:  65 year old male with bullous emphysema as well as bilateral lung nodules noted on screening low-dose chest CT scan. Suspect patient's yearly episodes of bronchitis and ongoing cough are indicators of probable underlying COPD given his chronic tobacco use. Spent a significant  amount of time today discussing the pathophysiology of COPD and the potential negative consequences of continued tobacco use. The patient's perioperative risk of pulmonary complications is low to moderate given his ongoing tobacco use and underlying sleep apnea with the proposed surgery. This should not preclude his ability to undergo the operation. I instructed the patient to contact my office if he had any further questions or concerns.  1. Proposed knee replacement surgery: Patient is at low to moderate risk of perioperative pulmonary complications given his underlying OSA, emphysema, and probable COPD. I would recommend cautious use of sedating medications around the time of extubation as this could worsen sleep apnea and CO2 retention as well as altered mentation. I would also recommend incentive spirometry postoperatively and Duonebs every 6 hours as needed for any wheezing. The patient should resume his CPAP therapy immediately postoperatively for treatment of his underlying OSA. 2. Bullous emphysema: Suspect patient has underlying COPD as well. Checking  full pulmonary function testing at next appointment. Also screening for alpha-1 antitrypsin deficiency. 3. Bilateral lung nodules: Noted on low-dose chest CT imaging. Patient is planned for follow-up CT imaging 6 months from his initial CT scan in September 2016. Likely due to prior exposure while living in [place] or [place]. Certainly malignancy is a risk given his ongoing tobacco use and family history. 4. OSA: Currently on CPAP therapy but reporting changes and his sleep quality. Currently being addressed by his outside physician. 5. Follow-up: Patient to return to clinic in 4-6 weeks or sooner if needed.

## 2015-11-02 NOTE — Patient Instructions (Signed)
1. Try to stop using cigarettes using nicotine patches and nicotine gum. Try to step down to a lower dose of the patch every month and use the gum intermittently when you have cravings. 2. I will contact you if your blood test for the genetic form of COPD & emphysema is positive. 3. We will perform lung tests such her next appointment to screen you for COPD. 4. I will see you back in 4-6 weeks but please call my office if you have any further questions or concerns.  Tests: 1. Full PFT at next appointment 2. Alpha-1 Antitrypsin Phenotype

## 2015-11-06 LAB — ALPHA-1 ANTITRYPSIN PHENOTYPE: A-1 Antitrypsin: 193 mg/dL (ref 83–199)

## 2015-12-07 ENCOUNTER — Encounter: Payer: Self-pay | Admitting: Pulmonary Disease

## 2015-12-07 ENCOUNTER — Ambulatory Visit (INDEPENDENT_AMBULATORY_CARE_PROVIDER_SITE_OTHER): Payer: 59 | Admitting: Pulmonary Disease

## 2015-12-07 VITALS — BP 128/68 | HR 88 | Ht 71.0 in | Wt 215.0 lb

## 2015-12-07 DIAGNOSIS — Z23 Encounter for immunization: Secondary | ICD-10-CM | POA: Diagnosis not present

## 2015-12-07 DIAGNOSIS — J439 Emphysema, unspecified: Secondary | ICD-10-CM

## 2015-12-07 DIAGNOSIS — R918 Other nonspecific abnormal finding of lung field: Secondary | ICD-10-CM | POA: Diagnosis not present

## 2015-12-07 DIAGNOSIS — G4733 Obstructive sleep apnea (adult) (pediatric): Secondary | ICD-10-CM

## 2015-12-07 DIAGNOSIS — F17200 Nicotine dependence, unspecified, uncomplicated: Secondary | ICD-10-CM | POA: Diagnosis not present

## 2015-12-07 DIAGNOSIS — Z9989 Dependence on other enabling machines and devices: Secondary | ICD-10-CM

## 2015-12-07 LAB — PULMONARY FUNCTION TEST
DL/VA % pred: 70 %
DL/VA: 3.28 ml/min/mmHg/L
DLCO cor % pred: 66 %
DLCO cor: 22.57 ml/min/mmHg
DLCO unc % pred: 66 %
DLCO unc: 22.31 ml/min/mmHg
FEF 25-75 Post: 2.08 L/sec
FEF 25-75 Pre: 2.2 L/sec
FEF2575-%Change-Post: -5 %
FEF2575-%Pred-Post: 70 %
FEF2575-%Pred-Pre: 75 %
FEV1-%Change-Post: 0 %
FEV1-%Pred-Post: 91 %
FEV1-%Pred-Pre: 91 %
FEV1-Post: 3.34 L
FEV1-Pre: 3.33 L
FEV1FVC-%Change-Post: 7 %
FEV1FVC-%Pred-Pre: 95 %
FEV6-%Change-Post: -5 %
FEV6-%Pred-Post: 94 %
FEV6-%Pred-Pre: 99 %
FEV6-Post: 4.34 L
FEV6-Pre: 4.6 L
FEV6FVC-%Change-Post: 0 %
FEV6FVC-%Pred-Post: 104 %
FEV6FVC-%Pred-Pre: 103 %
FVC-%Change-Post: -6 %
FVC-%Pred-Post: 89 %
FVC-%Pred-Pre: 96 %
FVC-Post: 4.36 L
FVC-Pre: 4.67 L
Post FEV1/FVC ratio: 77 %
Post FEV6/FVC ratio: 99 %
Pre FEV1/FVC ratio: 71 %
Pre FEV6/FVC Ratio: 99 %
RV % pred: 99 %
RV: 2.35 L
TLC % pred: 96 %
TLC: 6.99 L

## 2015-12-07 NOTE — Patient Instructions (Signed)
1. Continue using your Combivent on as-needed basis. 2. Call me if you have any new breathing problems before your next appointment. 3. Continue to work on trying to quit smoking totally. 4. Our office will call you with the results of your CT scan. 5. I will see you back in 6 months with a breathing test at that time to monitor your lung function.  TESTS ORDERED: 1. Spirometry with DLCO at next appointment. 2. CT chest without contrast March 2017

## 2015-12-07 NOTE — Progress Notes (Signed)
PFT done today. 

## 2015-12-07 NOTE — Progress Notes (Signed)
Subjective:    Patient ID: Richard Lynch, male    DOB: September 07, 1951, 65 y.o.   MRN: [id number]  C.C.:  Follow-up for Bullous Emphysema, Bilateral Lung Nodules, OSA, & Ongoing Tobacco Use.  HPI Bullous emphysema:  No evidence for alpha-1 antitrypsin deficiency or obstructive lung disease. He reports his cough has improved. Productive clear sputum mostly, occasionally brown tinged. Denies dyspnea or wheezing. He is exercising using a stationary bike with minimal dyspnea.   Bilateral lung nodules: Initially noted on low dose chest CT imaging September 2016. Quite probably secondary to exposure while living in [place] and [place]. Plan for repeat CT imaging March 2017. Denies hemoptysis.  OSA: Currently on CPAP therapy. Followed by outside physician. He is still getting poor quality sleep.  Ongoing Tobacco Use:  He has cut back to 12 cigarettes daily and has been using a patch.   Review of Systems No chest pain or pressure. No fever or chills but does have occasional night sweats.  No Known Allergies  Outpatient Encounter Prescriptions as of 12/07/2015  Medication Sig Note  . amphetamine-dextroamphetamine (ADDERALL) 20 MG tablet Take 20 mg by mouth daily as needed.  12/07/2015: For drowsy days  . Ascorbic Acid (VITAMIN C) 100 MG tablet Take 100 mg by mouth daily.   Marland Kitchen aspirin EC 81 MG tablet Take 81 mg by mouth daily.   Marland Kitchen atenolol (TENORMIN) 25 MG tablet Take 25 mg by mouth daily.  11/02/2015: Received from: External Pharmacy Received Sig:   . Calcium Carbonate (CALCIUM 600 PO) Take 1 tablet by mouth daily.   . candesartan (ATACAND) 32 MG tablet Take 32 mg by mouth daily.  11/02/2015: Received from: External Pharmacy Received Sig:   . chlorthalidone (HYGROTON) 25 MG tablet Take 25 mg by mouth daily.  11/02/2015: Received from: External Pharmacy Received Sig:   . Cholecalciferol (VITAMIN D) 2000 units tablet Take 4,000 Units by mouth daily.   . COMBIVENT RESPIMAT 20-100 MCG/ACT AERS respimat  Inhale 1 puff into the lungs every 6 (six) hours as needed for wheezing. As needed 12/07/2015: Rarely uses  . cyanocobalamin 1000 MCG tablet Take 100 mcg by mouth daily.   Marland Kitchen DEXILANT 60 MG capsule Take 60 mg by mouth daily. Once daily 11/02/2015: Received from: External Pharmacy Received Sig:   . fenofibrate 160 MG tablet Take 160 mg by mouth daily.  11/02/2015: Received from: External Pharmacy Received Sig:   . LIVALO 2 MG TABS Take 2 mg by mouth daily.  11/02/2015: Received from: External Pharmacy Received Sig:   . meloxicam (MOBIC) 15 MG tablet Take 15 mg by mouth daily.  11/02/2015: Received from: External Pharmacy Received Sig:   . nicotine (NICODERM CQ - DOSED IN MG/24 HOURS) 21 mg/24hr patch Place 21 mg onto the skin daily. 12/07/2015: Start date:12/06/15  . valACYclovir (VALTREX) 500 MG tablet Take 500 mg by mouth daily.  11/02/2015: Received from: External Pharmacy Received Sig:   . vitamin E 400 UNIT capsule Take 400 Units by mouth daily.    No facility-administered encounter medications on file as of 12/07/2015.    Past Medical History  Diagnosis Date  . Hypertension   . High cholesterol   . Emphysema lung ([place])   . OSA (obstructive sleep apnea)     on CPAP  . GERD (gastroesophageal reflux disease)     Past Surgical History  Procedure Laterality Date  . Elbow surgery  08/2015    bilateral  . Knee surgery      3  times on both knee's each  . Total hip arthroplasty  08/2010  . Hernia repair  07/2010    Family History  Problem Relation Age of Onset  . Emphysema Father   . Heart disease Father   . Heart disease Mother   . Colon cancer Mother   . Breast cancer Mother     Social History   Social History  . Marital Status: Married    Spouse Name: N/A  . Number of Children: Y  . Years of Education: N/A   Occupational History  . owner of floor store    Social History Main Topics  . Smoking status: Current Some Day Smoker -- 0.25 packs/day    Types: Cigarettes    Start  date: 01/24/1973  . Smokeless tobacco: None     Comment: Peak rate of 3ppd  . Alcohol Use: 0.0 oz/week    0 Standard drinks or equivalent per week     Comment: 6 drinks per week  . Drug Use: No  . Sexual Activity: Not Asked   Other Topics Concern  . None   Social History Narrative   Originally from [place]. He has lived in [place] as well. He moved to [facility] in 1964. 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 He has bachelors degree in Business. Has a dog currently. No bird or mold exposure.       Objective:   Physical Exam BP 128/68 mmHg  Pulse 88  Ht 5\' 11"  (1.803 m)  Wt 215 lb (97.523 kg)  BMI 30.00 kg/m2  SpO2 92% General:  Awake. Alert. Well-dressed. Integument:  Warm & dry. No rash on exposed skin.  Lymphatics:  No appreciated cervical or supraclavicular lymphadenoapthy. HEENT:  Moist mucus membranes. No oral ulcers. No nasal turbinate swelling. Cardiovascular:  Regular rate. No edema. No appreciable JVD.  Pulmonary:  Clear bilaterally to auscultation. Symmetric chest wall expansion. No accessory muscle use. Abdomen: Soft. Normal bowel sounds. Protuberant. Grossly nontender.  PFT 12/07/15:  FVC 4.67 L (96%) FEV1 3.33 L (91%) FEV1/FVC 0.71 FEF 25-75 2.20 L (75%) no bronchodilator response TLC 6.99 L (96%) RV 99% ERV 52% DLCO uncorrected 66%  IMAGING LD CT CHEST W/O 07/01/15 (previously reviewed by me): Apical predominant emphysematous changes with subpleural blebs. 7 mm nodule with associated linear opacity consistent with scar in left lower lobe. Subcentimeter nodules noted otherwise. No pleural effusion or thickening. No pathologic mediastinal adenopathy. No pericardial effusion.  LABS 11/02/15 Alpha-1 Antitrypsin:  MM (193)    Assessment & Plan:  65 year old male with bullous emphysema as well as bilateral lung nodules noted on screening low-dose chest CT scan. Pulmonary function testing today shows no evidence  for airways obstruction with normal lung volumes and a decreased carbon dioxide diffusion capacity. I reviewed his serum lab tests from last appointment with him as well. We are planning to repeat his CT scan next month to follow his lung nodules given his ongoing tobacco use. I instructed the patient contacted my office for any new breathing problems before his next appointment.  1. Bullous emphysema: Likely secondary to tobacco use. No evidence for alpha-1 antitrypsin deficiency or COPD. Repeat spirometry with DLCO at next appointment to ensure stability of lung function. 2. OSA: Currently on CPAP therapy. Outside physician managing this and believes patient may have restless leg syndrome. 3. Bilateral lung nodules: Noted on low-dose chest CT imaging September 2016. Laying for repeat CT scan chest  without contrast March 2017. 4. Tobacco use: Spent over 3 minutes again today counseling the patient on the need to complete the quit smoking. He is continuing to taper off cigarette use utilizing nicotine patches. 5. Health Maintenance: Administering Prevnar vaccine today. Received influenza vaccine September 2016 & Pneumovax January 2014. 6. Follow-up: Return to clinic in 6 months or sooner if needed.

## 2015-12-08 ENCOUNTER — Encounter (HOSPITAL_COMMUNITY): Payer: Self-pay | Admitting: Physician Assistant

## 2015-12-08 DIAGNOSIS — M1711 Unilateral primary osteoarthritis, right knee: Secondary | ICD-10-CM

## 2015-12-08 HISTORY — DX: Unilateral primary osteoarthritis, right knee: M17.11

## 2015-12-08 NOTE — H&P (Signed)
TOTAL KNEE ADMISSION H&P  Patient is being admitted for right total knee arthroplasty.  Subjective:  Chief Complaint:right knee pain.  HPI: Richard Lynch, 65 y.o. male, has a history of pain and functional disability in the right knee due to arthritis and has failed non-surgical conservative treatments for greater than 12 weeks to includeNSAID's and/or analgesics, corticosteriod injections, viscosupplementation injections, flexibility and strengthening excercises, supervised PT with diminished ADL's post treatment, use of assistive devices and activity modification.  Onset of symptoms was gradual, starting 10 years ago with gradually worsening course since that time. The patient noted prior procedures on the knee to include  arthroscopy and menisectomy on the right knee(s).  Patient currently rates pain in the right knee(s) at 10 out of 10 with activity. Patient has night pain, worsening of pain with activity and weight bearing, pain that interferes with activities of daily living, crepitus and joint swelling.  Patient has evidence of subchondral sclerosis, periarticular osteophytes and joint space narrowing by imaging studies. There is no active infection.  Patient Active Problem List   Diagnosis Date Noted  . Primary localized osteoarthritis of right knee 12/08/2015  . Bullous emphysema ([place]) 11/02/2015  . Essential hypertension 11/02/2015  . Arthritis 11/02/2015  . Hyperlipidemia 11/02/2015  . OSA on CPAP 11/02/2015  . GERD (gastroesophageal reflux disease) 11/02/2015  . Septic olecranon bursitis 06/24/2015  . Multiple lung nodules on CT 06/30/2014   Past Medical History  Diagnosis Date  . Hypertension   . High cholesterol   . Emphysema lung ([place])   . OSA (obstructive sleep apnea)     on CPAP  . GERD (gastroesophageal reflux disease)   . Primary localized osteoarthritis of right knee 12/08/2015  . Septic olecranon bursitis 06/2015    MSSA    Past Surgical History  Procedure  Laterality Date  . Elbow surgery  08/2015    bilateral  . Knee surgery      3 times on both knee's each  . Total hip arthroplasty  08/2010  . Hernia repair  07/2010    No current facility-administered medications for this encounter.  Current outpatient prescriptions:  .  amphetamine-dextroamphetamine (ADDERALL) 20 MG tablet, Take 20 mg by mouth daily as needed. , Disp: , Rfl:  .  Ascorbic Acid (VITAMIN C) 100 MG tablet, Take 100 mg by mouth daily., Disp: , Rfl:  .  aspirin EC 81 MG tablet, Take 81 mg by mouth daily., Disp: , Rfl:  .  atenolol (TENORMIN) 25 MG tablet, Take 25 mg by mouth daily. , Disp: , Rfl:  .  Calcium Carbonate (CALCIUM 600 PO), Take 1 tablet by mouth daily., Disp: , Rfl:  .  candesartan (ATACAND) 32 MG tablet, Take 32 mg by mouth daily. , Disp: , Rfl:  .  chlorthalidone (HYGROTON) 25 MG tablet, Take 25 mg by mouth daily. , Disp: , Rfl:  .  Cholecalciferol (VITAMIN D) 2000 units tablet, Take 4,000 Units by mouth daily., Disp: , Rfl:  .  COMBIVENT RESPIMAT 20-100 MCG/ACT AERS respimat, Inhale 1 puff into the lungs every 6 (six) hours as needed for wheezing. As needed, Disp: , Rfl:  .  cyanocobalamin 1000 MCG tablet, Take 100 mcg by mouth daily., Disp: , Rfl:  .  DEXILANT 60 MG capsule, Take 60 mg by mouth daily. Once daily, Disp: , Rfl:  .  fenofibrate 160 MG tablet, Take 160 mg by mouth daily. , Disp: , Rfl:  .  LIVALO 2 MG TABS, Take 2 mg  by mouth daily. , Disp: , Rfl:  .  meloxicam (MOBIC) 15 MG tablet, Take 15 mg by mouth daily. , Disp: , Rfl:  .  nicotine (NICODERM CQ - DOSED IN MG/24 HOURS) 21 mg/24hr patch, Place 21 mg onto the skin daily., Disp: , Rfl:  .  valACYclovir (VALTREX) 500 MG tablet, Take 500 mg by mouth daily. , Disp: , Rfl:  .  vitamin E 400 UNIT capsule, Take 400 Units by mouth daily., Disp: , Rfl:      No Known Allergies     Social History  Substance Use Topics  . Smoking status: Current Some Day Smoker -- 0.25 packs/day    Types:  Cigarettes    Start date: 01/24/1973  . Smokeless tobacco: Not on file     Comment: Peak rate of 3ppd  . Alcohol Use: 0.0 oz/week    0 Standard drinks or equivalent per week     Comment: 6 drinks per week    Family History  Problem Relation Age of Onset  . Emphysema Father   . Heart disease Father   . Heart disease Mother   . Colon cancer Mother   . Breast cancer Mother      Review of Systems  Constitutional: Negative.   HENT: Negative.   Eyes: Negative.   Respiratory: Positive for shortness of breath. Negative for cough, hemoptysis, sputum production and wheezing.   Cardiovascular: Positive for leg swelling.  Gastrointestinal: Negative.   Genitourinary: Negative.   Musculoskeletal: Positive for joint pain.       Bilateral knee right worse than left  Skin: Negative.   Neurological: Negative.   Endo/Heme/Allergies: Negative.   Psychiatric/Behavioral: Negative.     Objective:  Physical Exam  Constitutional: He is oriented to person, place, and time. He appears well-developed and well-nourished.  HENT:  Head: Normocephalic and atraumatic.  Mouth/Throat: Oropharynx is clear and moist.  Eyes: Conjunctivae and EOM are normal. Pupils are equal, round, and reactive to light.  Neck: Neck supple.  Cardiovascular: Normal rate and regular rhythm.   Respiratory: Effort normal and breath sounds normal.  GI: Soft. Bowel sounds are normal.  Genitourinary:  Not pertinent to current symptomatology therefore not examined.  Musculoskeletal:  Examination of both knees reveal significant pain bilaterally, right worse than left.  Range of motion -5 to 125 degrees.  2+ crepitus.  2+ synovitis.  Knees are stable with diffuse pain and normal patella tracking.  Vascular exam: Pulses are 2+ and symmetric.     Neurological: He is alert and oriented to person, place, and time.  Skin: Skin is warm and dry.  Psychiatric: He has a normal mood and affect. His behavior is normal.    Vital signs in  last 24 hours: Pulse Rate:  [88] 88 (02/14 1445) BP: (128)/(68) 128/68 mmHg (02/14 1445) SpO2:  [92 %] 92 % (02/14 1445) Weight:  [97.523 kg (215 lb)] 97.523 kg (215 lb) (02/14 1445)  Labs:   Estimated body mass index is 29.47 kg/(m^2) as calculated from the following:   Height as of 11/02/15: 5\' 11"  (1.803 m).   Weight as of 11/02/15: 95.8 kg (211 lb 3.2 oz).   Imaging Review Plain radiographs demonstrate severe degenerative joint disease of the right knee(s). The overall alignment issignificant varus. The bone quality appears to be good for age and reported activity level.  Assessment/Plan:  End stage arthritis, right knee  Principal Problem:   Primary localized osteoarthritis of right knee Active Problems:   Bullous emphysema (  [place])   Multiple lung nodules on CT   Essential hypertension   OSA on CPAP   GERD (gastroesophageal reflux disease)   Septic olecranon bursitis   The patient history, physical examination, clinical judgment of the provider and imaging studies are consistent with end stage degenerative joint disease of the right knee(s) and total knee arthroplasty is deemed medically necessary. The treatment options including medical management, injection therapy arthroscopy and arthroplasty were discussed at length. The risks and benefits of total knee arthroplasty were presented and reviewed. The risks due to aseptic loosening, infection, stiffness, patella tracking problems, thromboembolic complications and other imponderables were discussed. The patient acknowledged the explanation, agreed to proceed with the plan and consent was signed. Patient is being admitted for inpatient treatment for surgery, pain control, PT, OT, prophylactic antibiotics, VTE prophylaxis, progressive ambulation and ADL's and discharge planning. The patient is planning to be discharged home with home health services

## 2015-12-09 ENCOUNTER — Encounter (HOSPITAL_COMMUNITY): Payer: Self-pay

## 2015-12-09 ENCOUNTER — Encounter (HOSPITAL_COMMUNITY)
Admission: RE | Admit: 2015-12-09 | Discharge: 2015-12-09 | Disposition: A | Discharge status: Home / Self Care | Payer: 59 | Source: Ambulatory Visit | Attending: Orthopedic Surgery | Admitting: Orthopedic Surgery

## 2015-12-09 DIAGNOSIS — K219 Gastro-esophageal reflux disease without esophagitis: Secondary | ICD-10-CM | POA: Diagnosis not present

## 2015-12-09 DIAGNOSIS — Z01818 Encounter for other preprocedural examination: Secondary | ICD-10-CM | POA: Diagnosis not present

## 2015-12-09 DIAGNOSIS — E7800 Pure hypercholesterolemia, unspecified: Secondary | ICD-10-CM | POA: Insufficient documentation

## 2015-12-09 DIAGNOSIS — Z96642 Presence of left artificial hip joint: Secondary | ICD-10-CM | POA: Diagnosis not present

## 2015-12-09 DIAGNOSIS — Z79899 Other long term (current) drug therapy: Secondary | ICD-10-CM | POA: Insufficient documentation

## 2015-12-09 DIAGNOSIS — M1711 Unilateral primary osteoarthritis, right knee: Secondary | ICD-10-CM | POA: Diagnosis not present

## 2015-12-09 DIAGNOSIS — G4733 Obstructive sleep apnea (adult) (pediatric): Secondary | ICD-10-CM | POA: Diagnosis not present

## 2015-12-09 DIAGNOSIS — Z0183 Encounter for blood typing: Secondary | ICD-10-CM | POA: Diagnosis not present

## 2015-12-09 DIAGNOSIS — Z7982 Long term (current) use of aspirin: Secondary | ICD-10-CM | POA: Diagnosis not present

## 2015-12-09 DIAGNOSIS — J439 Emphysema, unspecified: Secondary | ICD-10-CM | POA: Diagnosis not present

## 2015-12-09 DIAGNOSIS — Z01812 Encounter for preprocedural laboratory examination: Secondary | ICD-10-CM | POA: Insufficient documentation

## 2015-12-09 DIAGNOSIS — I10 Essential (primary) hypertension: Secondary | ICD-10-CM | POA: Diagnosis not present

## 2015-12-09 LAB — CBC WITH DIFFERENTIAL/PLATELET
Basophils Absolute: 0.1 10*3/uL (ref 0.0–0.1)
Basophils Relative: 1 %
Eosinophils Absolute: 0.3 10*3/uL (ref 0.0–0.7)
Eosinophils Relative: 3 %
HCT: 41.2 % (ref 39.0–52.0)
Hemoglobin: 14 g/dL (ref 13.0–17.0)
Lymphocytes Relative: 26 %
Lymphs Abs: 2.5 10*3/uL (ref 0.7–4.0)
MCH: 32.4 pg (ref 26.0–34.0)
MCHC: 34 g/dL (ref 30.0–36.0)
MCV: 95.4 fL (ref 78.0–100.0)
Monocytes Absolute: 0.7 10*3/uL (ref 0.1–1.0)
Monocytes Relative: 7 %
Neutro Abs: 5.9 10*3/uL (ref 1.7–7.7)
Neutrophils Relative %: 63 %
Platelets: 315 10*3/uL (ref 150–400)
RBC: 4.32 MIL/uL (ref 4.22–5.81)
RDW: 12.5 % (ref 11.5–15.5)
WBC: 9.4 10*3/uL (ref 4.0–10.5)

## 2015-12-09 LAB — COMPREHENSIVE METABOLIC PANEL
ALT: 30 U/L (ref 17–63)
AST: 23 U/L (ref 15–41)
Albumin: 4.2 g/dL (ref 3.5–5.0)
Alkaline Phosphatase: 46 U/L (ref 38–126)
Anion gap: 13 (ref 5–15)
BUN: 26 mg/dL — ABNORMAL HIGH (ref 6–20)
CO2: 25 mmol/L (ref 22–32)
Calcium: 9.7 mg/dL (ref 8.9–10.3)
Chloride: 101 mmol/L (ref 101–111)
Creatinine, Ser: 1.33 mg/dL — ABNORMAL HIGH (ref 0.61–1.24)
GFR calc Af Amer: >60 mL/min (ref >60)
GFR calc non Af Amer: 56 mL/min — ABNORMAL LOW (ref >60)
Glucose, Bld: 77 mg/dL (ref 65–99)
Potassium: 3.7 mmol/L (ref 3.5–5.1)
Sodium: 139 mmol/L (ref 135–145)
Total Bilirubin: 0.5 mg/dL (ref 0.3–1.2)
Total Protein: 7.1 g/dL (ref 6.5–8.1)

## 2015-12-09 LAB — TYPE AND SCREEN
ABO/RH(D): A POS
Antibody Screen: NEGATIVE

## 2015-12-09 LAB — SURGICAL PCR SCREEN
MRSA, PCR: NEGATIVE
Staphylococcus aureus: NEGATIVE

## 2015-12-09 LAB — PROTIME-INR
INR: 1 (ref 0.00–1.49)
Prothrombin Time: 13.4 seconds (ref 11.6–15.2)

## 2015-12-09 LAB — APTT: aPTT: 36 seconds (ref 24–37)

## 2015-12-09 NOTE — Progress Notes (Signed)
Patient saw cardiology around 2007 for 'flushing' and 'high blood pressure'.  States he had some cardiac testing done but not very clear what he had done.  Myra Gianotti PA aware.  States testing was normal and he has not had to follow up with cardiology since then.    Sees Dr. Ashok Cordia for pulmonology and Sadie [facility]er for PCP (Dr. Maceo Pro who has retired).   He is also positive for sleep apnea and wears a Bipap at night.

## 2015-12-09 NOTE — Progress Notes (Addendum)
Anesthesia Chart Review: Patient is a 65 year old male scheduled for right TKA on 12/20/15 by Dr. Noemi Chapel.   History includes smoking, bullous emphysema ("No evidence for alpha-1 antitrypsin deficiency or obstructive lung disease"), OSA (on CPAP), GERD, HTN, hypercholesterolemia, left THA '11, left IHR '11.   PCP is Dr. Maceo Pro, who reportedly recently retired. Pulmonologist is Dr. Blanchard Mane, last visit 12/07/15.  Meds include Adderall, ASA, atenolol, candesartan, chlorthalidone, Combivent Respimat, Dexilant, Livalo, Nicoderm CQ, Valtrex.  12/09/15 EKG: NSR, probable LAE, non-specific T wave abnormality.  He reported a normal cardiac work-up in 2007 including a cardiac cath. He thought the cath was done "across the street" from [facility], possible by Dr. Rollene Fare who is now retired. There is no copy of a cardiac cath report in Epic, with records dating back to at least 2004. There are 04/2006 cardiology notes in Epic from Dr. Haroldine Laws that state:  "1. Palpitations and presyncope.  1. Echocardiogram, stress test and loop monitor are all negative.  2. Urine catecholamines negative." Symptoms improved with b-blocker therapy.   04/15/06 Echo: SUMMARY - Overall left ventricular systolic function was normal. Left    ventricular ejection fraction was estimated to be 55 %. There    were no left ventricular regional wall motion abnormalities. - The left atrium was mildly dilated.  07/01/15 Chest CT (lung cancer screen): IMPRESSION: 1. Lung-Rads category 3, probably benign findings. Short-term follow-up in 6 months is recommended with repeat low-dose chest CT without contrast (please use the following order, "CT CHEST LUNG CA SCREEN LOW DOSE W/O CM"). 2. The 7.7 mm nodule in the left upper lobe is likely post inflammatory or infectious. 3. Emphysema 4. Aortic atherosclerosis.  12/07/15 PFTs: FVC 4.67 L (96%) FEV1 3.33 L (91%) FEV1/FVC 0.71 FEF 25-75 2.20 L (75%)  no bronchodilator response TLC 6.99 L (96%) RV 99% ERV 52% DLCO uncorrected 66%.  Preoperative labs noted. Urine culture is pending.  Patient reported clearances obtained for surgery. I've asked Dr. Archie Endo office to fax. Chart will be left for follow-up.  [facility] Short Stay [facility]/Anesthesiology Phone [phone] 12/09/2015 4:12 PM  Addendum: Pulmonary clearance note received from Dr. Milinda Hirschfeld, "low to moderate" perioperative pulmonary risk. Urine culture showed insignificant growth. Previous normal cardiac testing. EKG stable since 2011. If no acute changes then I anticipate that he can proceed as planned.  [facility] Short Stay Center/Anesthesiology Phone [phone] 12/10/2015 5:03 PM

## 2015-12-10 LAB — URINE CULTURE: Culture: 4000

## 2015-12-19 MED ORDER — CEFAZOLIN SODIUM-DEXTROSE 2-3 GM-% IV SOLR
2.0000 g | INTRAVENOUS | Status: AC
  Administered 2015-12-20: 2 g via INTRAVENOUS
  Filled 2015-12-19: qty 50

## 2015-12-20 ENCOUNTER — Encounter (HOSPITAL_COMMUNITY): Payer: Self-pay | Admitting: *Deleted

## 2015-12-20 ENCOUNTER — Inpatient Hospital Stay (HOSPITAL_COMMUNITY): Payer: 59 | Admitting: Vascular Surgery

## 2015-12-20 ENCOUNTER — Encounter (HOSPITAL_COMMUNITY)
Admission: AD | Disposition: A | Discharge status: Home Health | Payer: Self-pay | Source: Ambulatory Visit | Attending: Orthopedic Surgery

## 2015-12-20 ENCOUNTER — Inpatient Hospital Stay (HOSPITAL_COMMUNITY): Payer: 59 | Admitting: Anesthesiology

## 2015-12-20 ENCOUNTER — Inpatient Hospital Stay (HOSPITAL_COMMUNITY)
Admission: AD | Admit: 2015-12-20 | Discharge: 2015-12-23 | DRG: 470 | Disposition: A | Discharge status: Home Health | Payer: 59 | Source: Ambulatory Visit | Attending: Orthopedic Surgery | Admitting: Orthopedic Surgery

## 2015-12-20 DIAGNOSIS — F17210 Nicotine dependence, cigarettes, uncomplicated: Secondary | ICD-10-CM | POA: Diagnosis present

## 2015-12-20 DIAGNOSIS — J438 Other emphysema: Secondary | ICD-10-CM | POA: Diagnosis present

## 2015-12-20 DIAGNOSIS — R918 Other nonspecific abnormal finding of lung field: Secondary | ICD-10-CM | POA: Diagnosis present

## 2015-12-20 DIAGNOSIS — G4733 Obstructive sleep apnea (adult) (pediatric): Secondary | ICD-10-CM | POA: Diagnosis present

## 2015-12-20 DIAGNOSIS — I10 Essential (primary) hypertension: Secondary | ICD-10-CM | POA: Diagnosis present

## 2015-12-20 DIAGNOSIS — J439 Emphysema, unspecified: Secondary | ICD-10-CM | POA: Diagnosis present

## 2015-12-20 DIAGNOSIS — Z7982 Long term (current) use of aspirin: Secondary | ICD-10-CM

## 2015-12-20 DIAGNOSIS — Z79899 Other long term (current) drug therapy: Secondary | ICD-10-CM

## 2015-12-20 DIAGNOSIS — Z96649 Presence of unspecified artificial hip joint: Secondary | ICD-10-CM | POA: Diagnosis present

## 2015-12-20 DIAGNOSIS — T402X5A Adverse effect of other opioids, initial encounter: Secondary | ICD-10-CM | POA: Diagnosis not present

## 2015-12-20 DIAGNOSIS — Z791 Long term (current) use of non-steroidal anti-inflammatories (NSAID): Secondary | ICD-10-CM | POA: Diagnosis not present

## 2015-12-20 DIAGNOSIS — K5903 Drug induced constipation: Secondary | ICD-10-CM | POA: Diagnosis not present

## 2015-12-20 DIAGNOSIS — M1711 Unilateral primary osteoarthritis, right knee: Secondary | ICD-10-CM | POA: Diagnosis present

## 2015-12-20 DIAGNOSIS — Z9989 Dependence on other enabling machines and devices: Secondary | ICD-10-CM

## 2015-12-20 DIAGNOSIS — M71129 Other infective bursitis, unspecified elbow: Secondary | ICD-10-CM | POA: Diagnosis present

## 2015-12-20 DIAGNOSIS — M25561 Pain in right knee: Secondary | ICD-10-CM | POA: Diagnosis present

## 2015-12-20 DIAGNOSIS — K219 Gastro-esophageal reflux disease without esophagitis: Secondary | ICD-10-CM | POA: Diagnosis present

## 2015-12-20 DIAGNOSIS — E785 Hyperlipidemia, unspecified: Secondary | ICD-10-CM | POA: Diagnosis present

## 2015-12-20 HISTORY — DX: Drug induced constipation: K59.03

## 2015-12-20 HISTORY — DX: Other infective bursitis, unspecified elbow: M71.129

## 2015-12-20 HISTORY — PX: TOTAL KNEE ARTHROPLASTY: SHX125

## 2015-12-20 HISTORY — DX: Unilateral primary osteoarthritis, right knee: M17.11

## 2015-12-20 HISTORY — DX: Adverse effect of other opioids, initial encounter: T40.2X5A

## 2015-12-20 SURGERY — ARTHROPLASTY, KNEE, TOTAL
Anesthesia: Regional | Site: Knee | Laterality: Right

## 2015-12-20 MED ORDER — HYDROMORPHONE HCL 1 MG/ML IJ SOLN
1.0000 mg | INTRAMUSCULAR | Status: DC | PRN
  Administered 2015-12-20 – 2015-12-21 (×3): 1 mg via INTRAVENOUS
  Filled 2015-12-20 (×4): qty 1

## 2015-12-20 MED ORDER — CHLORHEXIDINE GLUCONATE 4 % EX LIQD
60.0000 mL | Freq: Once | CUTANEOUS | Status: DC
Start: 2015-12-20 — End: 2015-12-20

## 2015-12-20 MED ORDER — METOCLOPRAMIDE HCL 5 MG/ML IJ SOLN: 5.0000 mg | Freq: Three times a day (TID) | INTRAMUSCULAR | Status: DC | PRN

## 2015-12-20 MED ORDER — PRAVASTATIN SODIUM 40 MG PO TABS
40.0000 mg | ORAL_TABLET | Freq: Every day | ORAL | Status: DC
  Administered 2015-12-20 – 2015-12-22 (×3): 40 mg via ORAL
  Filled 2015-12-20 (×3): qty 1

## 2015-12-20 MED ORDER — 0.9 % SODIUM CHLORIDE (POUR BTL) OPTIME
TOPICAL | Status: DC | PRN
  Administered 2015-12-20: 1000 mL

## 2015-12-20 MED ORDER — MENTHOL 3 MG MT LOZG: 1.0000 | LOZENGE | OROMUCOSAL | Status: DC | PRN

## 2015-12-20 MED ORDER — EPHEDRINE SULFATE 50 MG/ML IJ SOLN
INTRAMUSCULAR | Status: AC
  Filled 2015-12-20: qty 1

## 2015-12-20 MED ORDER — VITAMIN D 1000 UNITS PO TABS
1000.0000 [IU] | ORAL_TABLET | Freq: Every day | ORAL | Status: DC
  Administered 2015-12-20 – 2015-12-23 (×4): 1000 [IU] via ORAL
  Filled 2015-12-20 (×4): qty 1

## 2015-12-20 MED ORDER — DOCUSATE SODIUM 100 MG PO CAPS
100.0000 mg | ORAL_CAPSULE | Freq: Two times a day (BID) | ORAL | Status: DC
  Administered 2015-12-20 – 2015-12-23 (×7): 100 mg via ORAL
  Filled 2015-12-20 (×7): qty 1

## 2015-12-20 MED ORDER — BUPIVACAINE-EPINEPHRINE 0.25% -1:200000 IJ SOLN
INTRAMUSCULAR | Status: DC | PRN
  Administered 2015-12-20: 30 mL

## 2015-12-20 MED ORDER — POVIDONE-IODINE 7.5 % EX SOLN
Freq: Once | CUTANEOUS | Status: DC
  Filled 2015-12-20: qty 118

## 2015-12-20 MED ORDER — SODIUM CHLORIDE 0.9 % IJ SOLN
INTRAMUSCULAR | Status: AC
  Filled 2015-12-20: qty 10

## 2015-12-20 MED ORDER — ONDANSETRON HCL 4 MG/2ML IJ SOLN: 4.0000 mg | Freq: Four times a day (QID) | INTRAMUSCULAR | Status: DC | PRN

## 2015-12-20 MED ORDER — ONDANSETRON HCL 4 MG/2ML IJ SOLN
INTRAMUSCULAR | Status: AC
  Filled 2015-12-20: qty 2

## 2015-12-20 MED ORDER — CELECOXIB 200 MG PO CAPS
200.0000 mg | ORAL_CAPSULE | Freq: Two times a day (BID) | ORAL | Status: DC
  Administered 2015-12-20 – 2015-12-23 (×7): 200 mg via ORAL
  Filled 2015-12-20 (×7): qty 1

## 2015-12-20 MED ORDER — POLYETHYLENE GLYCOL 3350 17 G PO PACK
17.0000 g | PACK | Freq: Two times a day (BID) | ORAL | Status: DC
  Administered 2015-12-22 – 2015-12-23 (×3): 17 g via ORAL
  Filled 2015-12-20 (×6): qty 1

## 2015-12-20 MED ORDER — BUPIVACAINE-EPINEPHRINE (PF) 0.5% -1:200000 IJ SOLN
INTRAMUSCULAR | Status: DC | PRN
  Administered 2015-12-20: 30 mL via PERINEURAL

## 2015-12-20 MED ORDER — SODIUM CHLORIDE 0.9 % IR SOLN
Status: DC | PRN
Start: 2015-12-20 — End: 2015-12-20
  Administered 2015-12-20 (×3): 1000 mL

## 2015-12-20 MED ORDER — LACTATED RINGERS IV SOLN
INTRAVENOUS | Status: DC | PRN
  Administered 2015-12-20 (×2): via INTRAVENOUS

## 2015-12-20 MED ORDER — BUPIVACAINE IN DEXTROSE 0.75-8.25 % IT SOLN
INTRATHECAL | Status: DC | PRN
  Administered 2015-12-20: 15 mg via INTRATHECAL

## 2015-12-20 MED ORDER — LIDOCAINE HCL (CARDIAC) 20 MG/ML IV SOLN
INTRAVENOUS | Status: AC
  Filled 2015-12-20: qty 5

## 2015-12-20 MED ORDER — ATENOLOL 50 MG PO TABS
25.0000 mg | ORAL_TABLET | Freq: Every day | ORAL | Status: DC
  Administered 2015-12-21 – 2015-12-23 (×3): 25 mg via ORAL
  Filled 2015-12-20 (×3): qty 1

## 2015-12-20 MED ORDER — CALCIUM CARBONATE 1500 (600 CA) MG PO TABS
1500.0000 mg | ORAL_TABLET | Freq: Every day | ORAL | Status: DC
  Administered 2015-12-21 – 2015-12-23 (×3): 1500 mg via ORAL
  Filled 2015-12-20 (×4): qty 1

## 2015-12-20 MED ORDER — OXYCODONE HCL 5 MG PO TABS
5.0000 mg | ORAL_TABLET | ORAL | Status: DC | PRN
  Administered 2015-12-20 (×2): 10 mg via ORAL
  Filled 2015-12-20 (×3): qty 2

## 2015-12-20 MED ORDER — IRBESARTAN 150 MG PO TABS
150.0000 mg | ORAL_TABLET | Freq: Every day | ORAL | Status: DC
  Administered 2015-12-22 – 2015-12-23 (×2): 150 mg via ORAL
  Filled 2015-12-20 (×2): qty 1

## 2015-12-20 MED ORDER — DEXAMETHASONE SODIUM PHOSPHATE 10 MG/ML IJ SOLN
10.0000 mg | Freq: Three times a day (TID) | INTRAMUSCULAR | Status: AC
Start: 2015-12-20 — End: 2015-12-21
  Administered 2015-12-20 – 2015-12-21 (×4): 10 mg via INTRAVENOUS
  Filled 2015-12-20 (×4): qty 1

## 2015-12-20 MED ORDER — POTASSIUM CHLORIDE IN NACL 20-0.9 MEQ/L-% IV SOLN
INTRAVENOUS | Status: DC
  Administered 2015-12-20 – 2015-12-21 (×2): via INTRAVENOUS
  Filled 2015-12-20 (×2): qty 1000

## 2015-12-20 MED ORDER — DIPHENHYDRAMINE HCL 12.5 MG/5ML PO ELIX: 12.5000 mg | ORAL_SOLUTION | ORAL | Status: DC | PRN

## 2015-12-20 MED ORDER — IPRATROPIUM-ALBUTEROL 0.5-2.5 (3) MG/3ML IN SOLN: 3.0000 mL | Freq: Four times a day (QID) | RESPIRATORY_TRACT | Status: DC

## 2015-12-20 MED ORDER — VITAMIN C 500 MG PO TABS
1000.0000 mg | ORAL_TABLET | Freq: Every day | ORAL | Status: DC
  Administered 2015-12-20 – 2015-12-23 (×4): 1000 mg via ORAL
  Filled 2015-12-20 (×4): qty 2

## 2015-12-20 MED ORDER — EPHEDRINE SULFATE 50 MG/ML IJ SOLN
INTRAMUSCULAR | Status: DC | PRN
  Administered 2015-12-20 (×2): 5 mg via INTRAVENOUS

## 2015-12-20 MED ORDER — PHENYLEPHRINE 40 MCG/ML (10ML) SYRINGE FOR IV PUSH (FOR BLOOD PRESSURE SUPPORT)
PREFILLED_SYRINGE | INTRAVENOUS | Status: AC
  Filled 2015-12-20: qty 10

## 2015-12-20 MED ORDER — ONDANSETRON HCL 4 MG PO TABS: 4.0000 mg | ORAL_TABLET | Freq: Four times a day (QID) | ORAL | Status: DC | PRN

## 2015-12-20 MED ORDER — METOCLOPRAMIDE HCL 5 MG PO TABS: 5.0000 mg | ORAL_TABLET | Freq: Three times a day (TID) | ORAL | Status: DC | PRN

## 2015-12-20 MED ORDER — HYDROMORPHONE HCL 1 MG/ML IJ SOLN: 0.2500 mg | INTRAMUSCULAR | Status: DC | PRN

## 2015-12-20 MED ORDER — PROPOFOL 500 MG/50ML IV EMUL
INTRAVENOUS | Status: DC | PRN
Start: 2015-12-20 — End: 2015-12-20
  Administered 2015-12-20: 100 ug/kg/min via INTRAVENOUS

## 2015-12-20 MED ORDER — APIXABAN 2.5 MG PO TABS
2.5000 mg | ORAL_TABLET | Freq: Two times a day (BID) | ORAL | Status: DC
  Administered 2015-12-21 – 2015-12-23 (×5): 2.5 mg via ORAL
  Filled 2015-12-20 (×5): qty 1

## 2015-12-20 MED ORDER — LORAZEPAM 1 MG PO TABS
1.0000 mg | ORAL_TABLET | Freq: Every day | ORAL | Status: DC
  Administered 2015-12-21 – 2015-12-22 (×2): 1 mg via ORAL
  Filled 2015-12-20 (×2): qty 1

## 2015-12-20 MED ORDER — CEFAZOLIN SODIUM-DEXTROSE 2-3 GM-% IV SOLR
2.0000 g | Freq: Four times a day (QID) | INTRAVENOUS | Status: AC
  Administered 2015-12-20 (×2): 2 g via INTRAVENOUS
  Filled 2015-12-20 (×2): qty 50

## 2015-12-20 MED ORDER — PHENOL 1.4 % MT LIQD: 1.0000 | OROMUCOSAL | Status: DC | PRN

## 2015-12-20 MED ORDER — VITAMIN D 50 MCG (2000 UT) PO TABS: 4000.0000 [IU] | ORAL_TABLET | Freq: Every day | ORAL | Status: DC

## 2015-12-20 MED ORDER — IPRATROPIUM-ALBUTEROL 20-100 MCG/ACT IN AERS: 1.0000 | INHALATION_SPRAY | Freq: Four times a day (QID) | RESPIRATORY_TRACT | Status: DC

## 2015-12-20 MED ORDER — VALACYCLOVIR HCL 500 MG PO TABS
500.0000 mg | ORAL_TABLET | Freq: Every day | ORAL | Status: DC
  Administered 2015-12-21 – 2015-12-23 (×3): 500 mg via ORAL
  Filled 2015-12-20 (×3): qty 1

## 2015-12-20 MED ORDER — BUPIVACAINE-EPINEPHRINE (PF) 0.25% -1:200000 IJ SOLN
INTRAMUSCULAR | Status: AC
  Filled 2015-12-20: qty 30

## 2015-12-20 MED ORDER — FENTANYL CITRATE (PF) 250 MCG/5ML IJ SOLN
INTRAMUSCULAR | Status: AC
  Filled 2015-12-20: qty 5

## 2015-12-20 MED ORDER — LACTATED RINGERS IV SOLN
INTRAVENOUS | Status: DC
Start: 2015-12-20 — End: 2015-12-20

## 2015-12-20 MED ORDER — MIDAZOLAM HCL 5 MG/5ML IJ SOLN
INTRAMUSCULAR | Status: DC | PRN
  Administered 2015-12-20: 2 mg via INTRAVENOUS

## 2015-12-20 MED ORDER — ALUM & MAG HYDROXIDE-SIMETH 200-200-20 MG/5ML PO SUSP: 30.0000 mL | ORAL | Status: DC | PRN

## 2015-12-20 MED ORDER — SUCCINYLCHOLINE CHLORIDE 20 MG/ML IJ SOLN
INTRAMUSCULAR | Status: AC
  Filled 2015-12-20: qty 1

## 2015-12-20 MED ORDER — IPRATROPIUM-ALBUTEROL 0.5-2.5 (3) MG/3ML IN SOLN: 3.0000 mL | RESPIRATORY_TRACT | Status: DC | PRN

## 2015-12-20 MED ORDER — ACETAMINOPHEN 325 MG PO TABS
650.0000 mg | ORAL_TABLET | Freq: Four times a day (QID) | ORAL | Status: DC | PRN
Start: 2015-12-20 — End: 2015-12-23
  Administered 2015-12-20 (×2): 650 mg via ORAL
  Filled 2015-12-20 (×2): qty 2

## 2015-12-20 MED ORDER — VITAMIN C 500 MG PO TABS: 1000.0000 mg | ORAL_TABLET | Freq: Every day | ORAL | Status: DC

## 2015-12-20 MED ORDER — ACETAMINOPHEN 650 MG RE SUPP: 650.0000 mg | Freq: Four times a day (QID) | RECTAL | Status: DC | PRN

## 2015-12-20 MED ORDER — DEXAMETHASONE SODIUM PHOSPHATE 10 MG/ML IJ SOLN
INTRAMUSCULAR | Status: DC | PRN
Start: 2015-12-20 — End: 2015-12-20
  Administered 2015-12-20: 10 mg via INTRAVENOUS

## 2015-12-20 MED ORDER — ROCURONIUM BROMIDE 50 MG/5ML IV SOLN
INTRAVENOUS | Status: AC
  Filled 2015-12-20: qty 1

## 2015-12-20 MED ORDER — PHENYLEPHRINE HCL 10 MG/ML IJ SOLN
INTRAMUSCULAR | Status: DC | PRN
  Administered 2015-12-20: 80 ug via INTRAVENOUS
  Administered 2015-12-20: 40 ug via INTRAVENOUS

## 2015-12-20 MED ORDER — PANTOPRAZOLE SODIUM 40 MG PO TBEC
40.0000 mg | DELAYED_RELEASE_TABLET | Freq: Every day | ORAL | Status: DC
  Administered 2015-12-21 – 2015-12-23 (×3): 40 mg via ORAL
  Filled 2015-12-20 (×3): qty 1

## 2015-12-20 MED ORDER — CHLORTHALIDONE 25 MG PO TABS
25.0000 mg | ORAL_TABLET | Freq: Every day | ORAL | Status: DC
  Administered 2015-12-20 – 2015-12-23 (×4): 25 mg via ORAL
  Filled 2015-12-20 (×5): qty 1

## 2015-12-20 MED ORDER — FENTANYL CITRATE (PF) 100 MCG/2ML IJ SOLN
INTRAMUSCULAR | Status: DC | PRN
  Administered 2015-12-20: 50 ug via INTRAVENOUS
  Administered 2015-12-20: 25 ug via INTRAVENOUS

## 2015-12-20 MED ORDER — MIDAZOLAM HCL 2 MG/2ML IJ SOLN
INTRAMUSCULAR | Status: AC
  Filled 2015-12-20: qty 2

## 2015-12-20 MED ORDER — PROPOFOL 10 MG/ML IV BOLUS
INTRAVENOUS | Status: AC
  Filled 2015-12-20: qty 20

## 2015-12-20 MED ORDER — BUPIVACAINE-EPINEPHRINE (PF) 0.5% -1:200000 IJ SOLN: INTRAMUSCULAR | Status: DC | PRN

## 2015-12-20 MED ORDER — FENOFIBRATE 160 MG PO TABS
160.0000 mg | ORAL_TABLET | Freq: Every day | ORAL | Status: DC
  Administered 2015-12-20 – 2015-12-23 (×4): 160 mg via ORAL
  Filled 2015-12-20 (×4): qty 1

## 2015-12-20 SURGICAL SUPPLY — 66 items
BANDAGE ESMARK 6X9 LF (GAUZE/BANDAGES/DRESSINGS) ×1 IMPLANT
BENZOIN TINCTURE PRP APPL 2/3 (GAUZE/BANDAGES/DRESSINGS) ×2 IMPLANT
BLADE SAGITTAL 25.0X1.19X90 (BLADE) ×2 IMPLANT
BLADE SAW RECIP 87.9 MT (BLADE) ×2 IMPLANT
BLADE SAW SAG 90X13X1.27 (BLADE) ×2 IMPLANT
BLADE SURG 10 STRL SS (BLADE) ×6 IMPLANT
BNDG ELASTIC 6X15 VLCR STRL LF (GAUZE/BANDAGES/DRESSINGS) ×2 IMPLANT
BNDG ESMARK 6X9 LF (GAUZE/BANDAGES/DRESSINGS) ×2
BOWL SMART MIX CTS (DISPOSABLE) ×2 IMPLANT
CAPT KNEE TOTAL 3 ATTUNE ×2 IMPLANT
CEMENT HV SMART SET (Cement) ×4 IMPLANT
CLSR STERI-STRIP ANTIMIC 1/2X4 (GAUZE/BANDAGES/DRESSINGS) ×2 IMPLANT
COVER SURGICAL LIGHT HANDLE (MISCELLANEOUS) ×2 IMPLANT
CUFF TOURNIQUET SINGLE 34IN LL (TOURNIQUET CUFF) ×2 IMPLANT
CUFF TOURNIQUET SINGLE 44IN (TOURNIQUET CUFF) IMPLANT
DECANTER SPIKE VIAL GLASS SM (MISCELLANEOUS) ×2 IMPLANT
DRAPE EXTREMITY T 121X128X90 (DRAPE) ×2 IMPLANT
DRAPE INCISE IOBAN 66X45 STRL (DRAPES) ×2 IMPLANT
DRAPE PROXIMA HALF (DRAPES) ×2 IMPLANT
DRAPE U-SHAPE 47X51 STRL (DRAPES) ×2 IMPLANT
DRSG AQUACEL AG ADV 3.5X10 (GAUZE/BANDAGES/DRESSINGS) IMPLANT
DRSG AQUACEL AG ADV 3.5X14 (GAUZE/BANDAGES/DRESSINGS) ×2 IMPLANT
DURAPREP 26ML APPLICATOR (WOUND CARE) ×4 IMPLANT
ELECT CAUTERY BLADE 6.4 (BLADE) ×2 IMPLANT
ELECT REM PT RETURN 9FT ADLT (ELECTROSURGICAL) ×2
ELECTRODE REM PT RTRN 9FT ADLT (ELECTROSURGICAL) ×1 IMPLANT
FACESHIELD WRAPAROUND (MASK) ×2 IMPLANT
GLOVE BIO SURGEON STRL SZ7 (GLOVE) ×2 IMPLANT
GLOVE BIOGEL PI IND STRL 7.0 (GLOVE) ×1 IMPLANT
GLOVE BIOGEL PI IND STRL 7.5 (GLOVE) ×1 IMPLANT
GLOVE BIOGEL PI INDICATOR 7.0 (GLOVE) ×1
GLOVE BIOGEL PI INDICATOR 7.5 (GLOVE) ×1
GLOVE SS BIOGEL STRL SZ 7.5 (GLOVE) ×2 IMPLANT
GLOVE SUPERSENSE BIOGEL SZ 7.5 (GLOVE) ×2
GOWN STRL REUS W/ TWL LRG LVL3 (GOWN DISPOSABLE) ×1 IMPLANT
GOWN STRL REUS W/ TWL XL LVL3 (GOWN DISPOSABLE) ×2 IMPLANT
GOWN STRL REUS W/TWL LRG LVL3 (GOWN DISPOSABLE) ×1
GOWN STRL REUS W/TWL XL LVL3 (GOWN DISPOSABLE) ×2
HANDPIECE INTERPULSE COAX TIP (DISPOSABLE) ×1
HOOD PEEL AWAY FACE SHEILD DIS (HOOD) ×4 IMPLANT
IMMOBILIZER KNEE 22 UNIV (SOFTGOODS) ×2 IMPLANT
KIT BASIN OR (CUSTOM PROCEDURE TRAY) ×2 IMPLANT
KIT ROOM TURNOVER OR (KITS) ×2 IMPLANT
MANIFOLD NEPTUNE II (INSTRUMENTS) ×2 IMPLANT
MARKER SKIN DUAL TIP RULER LAB (MISCELLANEOUS) ×2 IMPLANT
NEEDLE 18GX1X1/2 (RX/OR ONLY) (NEEDLE) ×2 IMPLANT
NS IRRIG 1000ML POUR BTL (IV SOLUTION) ×2 IMPLANT
PACK TOTAL JOINT (CUSTOM PROCEDURE TRAY) ×2 IMPLANT
PAD ARMBOARD 7.5X6 YLW CONV (MISCELLANEOUS) ×4 IMPLANT
SET HNDPC FAN SPRY TIP SCT (DISPOSABLE) ×1 IMPLANT
STRIP CLOSURE SKIN 1/2X4 (GAUZE/BANDAGES/DRESSINGS) IMPLANT
SUCTION FRAZIER HANDLE 10FR (MISCELLANEOUS) ×1
SUCTION TUBE FRAZIER 10FR DISP (MISCELLANEOUS) ×1 IMPLANT
SUT MNCRL AB 3-0 PS2 18 (SUTURE) ×2 IMPLANT
SUT VIC AB 0 CT1 27 (SUTURE) ×3
SUT VIC AB 0 CT1 27XBRD ANBCTR (SUTURE) ×3 IMPLANT
SUT VIC AB 1 CT1 27 (SUTURE) ×1
SUT VIC AB 1 CT1 27XBRD ANBCTR (SUTURE) ×1 IMPLANT
SUT VIC AB 2-0 CT1 27 (SUTURE) ×2
SUT VIC AB 2-0 CT1 TAPERPNT 27 (SUTURE) ×2 IMPLANT
SYR 30ML LL (SYRINGE) ×2 IMPLANT
TOWEL OR 17X24 6PK STRL BLUE (TOWEL DISPOSABLE) ×2 IMPLANT
TOWEL OR 17X26 10 PK STRL BLUE (TOWEL DISPOSABLE) ×2 IMPLANT
TRAY FOLEY CATH 16FR SILVER (SET/KITS/TRAYS/PACK) ×2 IMPLANT
TUBE CONNECTING 12X1/4 (SUCTIONS) ×2 IMPLANT
YANKAUER SUCT BULB TIP NO VENT (SUCTIONS) ×2 IMPLANT

## 2015-12-20 NOTE — Op Note (Signed)
MRN:     [id number] DOB/AGE:    [birth year] / 65 y.o.       OPERATIVE REPORT    DATE OF PROCEDURE:  12/20/2015       PREOPERATIVE DIAGNOSIS:   Primary localized OA right knee      Estimated body mass index is 29.58 kg/(m^2) as calculated from the following:   Height as of this encounter: 5\' 11"  (1.803 m).   Weight as of this encounter: 96.163 kg (212 lb).                                                        POSTOPERATIVE DIAGNOSIS:   same                                                                     PROCEDURE:  Procedure(s): TOTAL KNEE ARTHROPLASTY Using Depuy Attune RP implants #8 Femur, #9Tibia, 84mm  RP bearing, 38 Patella     SURGEON: Cobin Cadavid A    ASSISTANT:  Kirstin Shepperson PA-C   (Present and scrubbed throughout the case, critical for assistance with exposure, retraction, instrumentation, and closure.)         ANESTHESIA: Spinal with Adductor Nerve Block     TOURNIQUET TIME: [id number]   COMPLICATIONS:  None     SPECIMENS: None   INDICATIONS FOR PROCEDURE: The patient has  djd right knee, varus deformities, XR shows bone on bone arthritis. Patient has failed all conservative measures including anti-inflammatory medicines, narcotics, attempts at  exercise and weight loss, cortisone injections and viscosupplementation.  Risks and benefits of surgery have been discussed, questions answered.   DESCRIPTION OF PROCEDURE: The patient identified by armband, received  right femoral nerve block and IV antibiotics, in the holding area at [facility]. Patient taken to the operating room, appropriate anesthetic  monitors were attached General endotracheal anesthesia induced with  the patient in supine position, Foley catheter was inserted. Tourniquet  applied high to the operative thigh. Lateral post and foot positioner  applied to the table, the lower extremity was then prepped and draped  in usual sterile fashion from the ankle to the tourniquet. Time-out procedure was  performed. The limb was wrapped with an Esmarch bandage and the tourniquet inflated to 365 mmHg. We began the operation by making the anterior midline incision starting at handbreadth above the patella going over the patella 1 cm medial to and  4 cm distal to the tibial tubercle. Small bleeders in the skin and the  subcutaneous tissue identified and cauterized. Transverse retinaculum was incised and reflected medially and a medial parapatellar arthrotomy was accomplished. the patella was everted and theprepatellar fat pad resected. The superficial medial collateral  ligament was then elevated from anterior to posterior along the proximal  flare of the tibia and anterior half of the menisci resected. The knee was hyperflexed exposing bone on bone arthritis. Peripheral and notch osteophytes as well as the cruciate ligaments were then resected. We continued to  work our way around posteriorly along the proximal tibia, and externally  rotated the tibia subluxing it out  from underneath the femur. A McHale  retractor was placed through the notch and a lateral Hohmann retractor  placed, and we then drilled through the proximal tibia in line with the  axis of the tibia followed by an intramedullary guide rod and 2-degree  posterior slope cutting guide. The tibial cutting guide was pinned into place  allowing resection of 4 mm of bone medially and about 6 mm of bone  laterally because of her varus deformity. Satisfied with the tibial resection, we then  entered the distal femur 2 mm anterior to the PCL origin with the  intramedullary guide rod and applied the distal femoral cutting guide  set at 80mm, with 5 degrees of valgus. This was pinned along the  epicondylar axis. At this point, the distal femoral cut was accomplished without difficulty. We then sized for a #8 femoral component and pinned the guide in 3 degrees of external rotation.The chamfer cutting guide was pinned into place. The anterior,  posterior, and chamfer cuts were accomplished without difficulty followed by  the  RP box cutting guide and the box cut. We also removed posterior osteophytes from the posterior femoral condyles. At this  time, the knee was brought into full extension. We checked our  extension and flexion gaps and found them symmetric at 20mm.  The patella thickness measured at 25 mm. We set the cutting guide at 15 and removed the posterior 9.5-10 mm  of the patella sized for 38 button and drilled the lollipop. The knee  was then once again hyperflexed exposing the proximal tibia. We sized for a #9 tibial base plate, applied the smokestack and the conical reamer followed by the the Delta fin keel punch. We then hammered into place the  RP trial femoral component, inserted a 1 trial bearing, trial patellar button, and took the knee through range of motion from 0-130 degrees. No thumb pressure was required for patellar  tracking. At this point, all trial components were removed, a double batch of DePuy HV cement  was mixed and applied to all bony metallic mating surfaces except for the posterior condyles of the femur itself. In order, we  hammered into place the tibial tray and removed excess cement, the femoral component and removed excess cement, a 51mm  RP bearing  was inserted, and the knee brought to full extension with compression.  The patellar button was clamped into place, and excess cement  removed. While the cement cured the wound was irrigated out with normal saline solution pulse lavage.. Ligament stability and patellar tracking were checked and found to be excellent.. The parapatellar arthrotomy was closed with  #1 Vicryl suture. The subcutaneous tissue with 0 and 2-0 undyed  Vicryl suture, and 4-0 Monocryl.. A dressing of Aquaseal,  4 x 4, dressing sponges, Webril, and Ace wrap applied. Needle and sponge count were correct times 2.The patient awakened, extubated, and taken to recovery room without  difficulty. Vascular status was normal, pulses 2+ and symmetric.   Reina Wilton A 12/20/2015, 9:18 AM

## 2015-12-20 NOTE — Progress Notes (Addendum)
Pt arrived from pacu to unit via bed with family at side. Pt RLE compression dsg remains clean, dry and intact. No stain or active bleeding noted. RLE remains in CPM; neuro intact except slight numbness to pt right foot. Pt oriented to the unit and room; IV intact and transfusing. Foley intact and unclamped; Pt in bed with CPM on with call light within reach and family. Will closely monitor. Delia Heady RN

## 2015-12-20 NOTE — Progress Notes (Signed)
Orthopedic Tech Progress Note Patient Details:  Richard Lynch Oct 23, 1951 [id number]  CPM Right Knee CPM Right Knee: On Right Knee Flexion (Degrees): 90 Right Knee Extension (Degrees): 0 Additional Comments: trapeze bar patient helper Viewed order from doctor's order list  Hildred Priest 12/20/2015, 10:36 AM

## 2015-12-20 NOTE — Transfer of Care (Signed)
Immediate Anesthesia Transfer of Care Note  Patient: Richard Lynch  Procedure(s) Performed: Procedure(s): TOTAL KNEE ARTHROPLASTY (Right)  Patient Location: PACU  Anesthesia Type:MAC and Spinal  Level of Consciousness: awake, alert , oriented and patient cooperative  Airway & Oxygen Therapy: Patient Spontanous Breathing and Patient connected to nasal cannula oxygen  Post-op Assessment: Report given to RN, Post -op Vital signs reviewed and stable and Patient moving all extremities  Post vital signs: Reviewed and stable  Last Vitals:  Filed Vitals:   12/20/15 0600  BP: 119/51  Pulse: 76  Temp: 36.5 C  Resp: 20    Complications: No apparent anesthesia complications

## 2015-12-20 NOTE — Progress Notes (Signed)
Utilization review completed.  

## 2015-12-20 NOTE — Anesthesia Postprocedure Evaluation (Signed)
Anesthesia Post Note  Patient: Richard Lynch  Procedure(s) Performed: Procedure(s) (LRB): TOTAL KNEE ARTHROPLASTY (Right)  Patient location during evaluation: PACU Anesthesia Type: Spinal, MAC and Regional Level of consciousness: awake and alert Pain management: pain level controlled Vital Signs Assessment: post-procedure vital signs reviewed and stable Respiratory status: spontaneous breathing, respiratory function stable and patient connected to nasal cannula oxygen Cardiovascular status: blood pressure returned to baseline and stable Postop Assessment: spinal receding Anesthetic complications: no    Last Vitals:  Filed Vitals:   12/20/15 1120 12/20/15 1150  BP: 118/71   Pulse: 69   Temp:  36.6 C  Resp: 9     Last Pain:  Filed Vitals:   12/20/15 1159  PainSc: 1                  Tarhonda Hollenberg,W. EDMOND

## 2015-12-20 NOTE — Anesthesia Procedure Notes (Addendum)
Anesthesia Regional Block:  Adductor canal block  Pre-Anesthetic Checklist: ,, timeout performed, Correct Patient, Correct Site, Correct Laterality, Correct Procedure, Correct Position, site marked, Risks and benefits discussed, pre-op evaluation,  At surgeon's request and post-op pain management  Laterality: Right  Prep: Maximum Sterile Barrier Precautions used and chloraprep       Needles:  Injection technique: Single-shot  Needle Type: Echogenic Stimulator Needle     Needle Length: 10cm 10 cm Needle Gauge: 21 and 21 G    Additional Needles:  Procedures: ultrasound guided (picture in chart) Adductor canal block Narrative:  Start time: 12/20/2015 6:50 AM End time: 12/20/2015 7:00 AM Injection made incrementally with aspirations every 5 mL. Anesthesiologist: Roderic Palau  Additional Notes: 2% Lidocaine skin wheel.    Spinal Patient location during procedure: OR Start time: 12/20/2015 7:17 AM End time: 12/20/2015 7:22 AM Staffing Anesthesiologist: Roderic Palau Performed by: anesthesiologist  Preanesthetic Checklist Completed: patient identified, surgical consent, pre-op evaluation, timeout performed, IV checked, risks and benefits discussed and monitors and equipment checked Spinal Block Patient position: sitting Prep: DuraPrep Patient monitoring: cardiac monitor, continuous pulse ox and blood pressure Approach: midline Location: L3-4 Injection technique: single-shot Needle Needle type: Pencan  Needle gauge: 24 G Needle length: 9 cm Assessment Sensory level: T6 Additional Notes Functioning IV was confirmed and monitors were applied. Sterile prep and drape, including hand hygiene and sterile gloves were used. The patient was positioned and the spine was prepped. The skin was anesthetized with lidocaine.  Free flow of clear CSF was obtained prior to injecting local anesthetic into the CSF.  The spinal needle aspirated freely following injection.  The  needle was carefully withdrawn.  The patient tolerated the procedure well.

## 2015-12-20 NOTE — Progress Notes (Signed)
Orthopedic Tech Progress Note Patient Details:  Richard Lynch [birth year] [id number] Ortho visit on cpm at 1950 Patient ID: [name], male   DOB: Apr 24, 1951, 65 y.o.   MRN: [id number]   Braulio Bosch 12/20/2015, 7:47 PM

## 2015-12-20 NOTE — Evaluation (Signed)
Physical Therapy Evaluation Patient Details Name: Richard Lynch MRN: [id number] DOB: 1951-06-04 Today's Date: 12/20/2015   History of Present Illness  Patient is a 65 y/o male with hx of HTN, OSA,Emphysema lung and high cholesterol s/p Rt TKA.   Clinical Impression  Patient presents with decreased sensation and weakness secondary to nerve block s/p Rt TKA impacting safe mobility. Pt tolerated ambulation with min guard assist for safety due to right knee instability. Pt does not have any support at home and lives alone. Independent and working PTA. Educated pt on exercises and precautions. Would benefit from [facility] to return to functional independence. Will follow acutely.    Follow Up Recommendations SNF;Supervision - Intermittent    Equipment Recommendations  None recommended by PT    Recommendations for Other Services OT consult     Precautions / Restrictions Precautions Precautions: Knee Precaution Booklet Issued: No Precaution Comments: Reviewed no pillow under knee and precautions. Required Braces or Orthoses: Knee Immobilizer - Right Knee Immobilizer - Right: Discontinue once straight leg raise with < 10 degree lag Restrictions Weight Bearing Restrictions: Yes RLE Weight Bearing: Weight bearing as tolerated      Mobility  Bed Mobility Overal bed mobility: Needs Assistance Bed Mobility: Supine to Sit     Supine to sit: HOB elevated;Supervision     General bed mobility comments: No assist needed. USe of rail for support.   Transfers Overall transfer level: Needs assistance Equipment used: Rolling walker (2 wheeled) Transfers: Sit to/from [facility] Sit to Stand: Min assist Stand pivot transfers: Min guard       General transfer comment: Min A for steadying in standing. SPT bed to chair with min guard assist for safety due to impaired sensation RLE.  Ambulation/Gait Ambulation/Gait assistance: Min guard Ambulation Distance (Feet): 70  Feet Assistive device: Rolling walker (2 wheeled) Gait Pattern/deviations: Step-to pattern;Step-through pattern;Decreased stance time - right;Decreased step length - left;Trunk flexed Gait velocity: decreased Gait velocity interpretation: Below normal speed for age/gender General Gait Details: Slow, mildly unsteady gait with right knee instability but no buckling.   Stairs            Wheelchair Mobility    Modified Rankin (Stroke Patients Only)       Balance Overall balance assessment: Needs assistance Sitting-balance support: Feet supported;No upper extremity supported Sitting balance-Leahy Scale: Good     Standing balance support: During functional activity;Bilateral upper extremity supported Standing balance-Leahy Scale: Poor Standing balance comment: Reliant on BUEs for support esp due to right knee instability.                              Pertinent Vitals/Pain Pain Assessment: No/denies pain    Home Living Family/patient expects to be discharged to:: Skilled nursing facility Living Arrangements: Alone   Type of Home: House Home Access: Level entry     Home Layout: One level Home Equipment: Walker - 2 wheels;Bedside commode      Prior Function Level of Independence: Independent         Comments: Drives, works.     Hand Dominance        Extremity/Trunk Assessment   Upper Extremity Assessment: Defer to OT evaluation           Lower Extremity Assessment: RLE deficits/detail RLE Deficits / Details: Able to perform SLR without knee extension lag.        Communication   Communication: No difficulties  Cognition  Arousal/Alertness: Awake/alert Behavior During Therapy: WFL for tasks assessed/performed Overall Cognitive Status: Within Functional Limits for tasks assessed                      General Comments General comments (skin integrity, edema, etc.): Pt plans to go to [facility] at discharge as he does not have  any support at home. Already met with people at [facility] place prior to surgery.    Exercises Total Joint Exercises Ankle Circles/Pumps: Both;15 reps;Supine Quad Sets: Both;10 reps;Seated      Assessment/Plan    PT Assessment Patient needs continued PT services  PT Diagnosis Difficulty walking   PT Problem List Decreased range of motion;Impaired sensation;Decreased activity tolerance;Decreased balance;Decreased mobility;Decreased strength  PT Treatment Interventions Balance training;Gait training;Functional mobility training;Therapeutic activities;Therapeutic exercise;Patient/family education   PT Goals (Current goals can be found in the Care Plan section) Acute Rehab PT Goals Patient Stated Goal: to go to rehab before home so he can gain independence PT Goal Formulation: With patient Time For Goal Achievement: 01/03/16 Potential to Achieve Goals: Good    Frequency 7X/week   Barriers to discharge Decreased caregiver support Pt lives alone    Co-evaluation               End of Session Equipment Utilized During Treatment: Gait belt Activity Tolerance: Patient tolerated treatment well Patient left: in chair;with call bell/phone within reach Nurse Communication: Mobility status         Time: 7639-4320 PT Time Calculation (min) (ACUTE ONLY): 29 min   Charges:   PT Evaluation $PT Eval Moderate Complexity: 1 Procedure PT Treatments $Gait Training: 8-22 mins   PT G Codes:        Claudette Wermuth A Dionne Rossa 12/20/2015, 2:36 PM  Wray Kearns, [place], DPT [phone]

## 2015-12-20 NOTE — Interval H&P Note (Signed)
History and Physical Interval Note:  12/20/2015 7:02 AM  Richard Lynch  has presented today for surgery, with the diagnosis of Primary localized OA right knee  The various methods of treatment have been discussed with the patient and family. After consideration of risks, benefits and other options for treatment, the patient has consented to  Procedure(s): TOTAL KNEE ARTHROPLASTY (Right) as a surgical intervention .  The patient's history has been reviewed, patient examined, no change in status, stable for surgery.  I have reviewed the patient's chart and labs.  Questions were answered to the patient's satisfaction.     [name] A

## 2015-12-20 NOTE — Anesthesia Preprocedure Evaluation (Addendum)
Anesthesia Evaluation  Patient identified by MRN, date of birth, ID band Patient awake    Reviewed: Allergy & Precautions, H&P , NPO status , Patient's Chart, lab work & pertinent test results, reviewed documented beta blocker date and time   Airway Mallampati: II  TM Distance: >3 FB Neck ROM: Full    Dental no notable dental hx. (+) Dental Advisory Given, Teeth Intact   Pulmonary sleep apnea , COPD,  COPD inhaler, Current Smoker,    Pulmonary exam normal breath sounds clear to auscultation       Cardiovascular hypertension, Pt. on medications and Pt. on home beta blockers negative cardio ROS   Rhythm:Regular Rate:Normal     Neuro/Psych negative neurological ROS  negative psych ROS   GI/Hepatic Neg liver ROS, GERD  Medicated and Controlled,  Endo/Other  negative endocrine ROS  Renal/GU negative Renal ROS  negative genitourinary   Musculoskeletal  (+) Arthritis , Osteoarthritis,    Abdominal   Peds  Hematology negative hematology ROS (+)   Anesthesia Other Findings   Reproductive/Obstetrics negative OB ROS                          Anesthesia Physical Anesthesia Plan  ASA: III  Anesthesia Plan: Spinal and Regional   Post-op Pain Management: MAC Combined w/ Regional for Post-op pain   Induction: Intravenous  Airway Management Planned: Simple Face Mask  Additional Equipment:   Intra-op Plan:   Post-operative Plan:   Informed Consent: I have reviewed the patients History and Physical, chart, labs and discussed the procedure including the risks, benefits and alternatives for the proposed anesthesia with the patient or authorized representative who has indicated his/her understanding and acceptance.   Dental advisory given  Plan Discussed with: CRNA, Surgeon and Anesthesiologist  Anesthesia Plan Comments:       Anesthesia Quick Evaluation

## 2015-12-21 ENCOUNTER — Encounter (HOSPITAL_COMMUNITY): Payer: Self-pay | Admitting: Orthopedic Surgery

## 2015-12-21 LAB — CBC
HCT: 31.2 % — ABNORMAL LOW (ref 39.0–52.0)
Hemoglobin: 10.7 g/dL — ABNORMAL LOW (ref 13.0–17.0)
MCH: 32.9 pg (ref 26.0–34.0)
MCHC: 34.3 g/dL (ref 30.0–36.0)
MCV: 96 fL (ref 78.0–100.0)
Platelets: 350 10*3/uL (ref 150–400)
RBC: 3.25 MIL/uL — ABNORMAL LOW (ref 4.22–5.81)
RDW: 12.6 % (ref 11.5–15.5)
WBC: 21.2 10*3/uL — ABNORMAL HIGH (ref 4.0–10.5)

## 2015-12-21 LAB — BASIC METABOLIC PANEL
Anion gap: 9 (ref 5–15)
BUN: 22 mg/dL — ABNORMAL HIGH (ref 6–20)
CO2: 25 mmol/L (ref 22–32)
Calcium: 8.6 mg/dL — ABNORMAL LOW (ref 8.9–10.3)
Chloride: 106 mmol/L (ref 101–111)
Creatinine, Ser: 1.21 mg/dL (ref 0.61–1.24)
GFR calc Af Amer: >60 mL/min (ref >60)
GFR calc non Af Amer: >60 mL/min (ref >60)
Glucose, Bld: 179 mg/dL — ABNORMAL HIGH (ref 65–99)
Potassium: 3.9 mmol/L (ref 3.5–5.1)
Sodium: 140 mmol/L (ref 135–145)

## 2015-12-21 MED ORDER — MORPHINE SULFATE ER 15 MG PO TBCR
15.0000 mg | EXTENDED_RELEASE_TABLET | Freq: Two times a day (BID) | ORAL | Status: DC
  Administered 2015-12-21 – 2015-12-23 (×5): 15 mg via ORAL
  Filled 2015-12-21 (×5): qty 1

## 2015-12-21 MED ORDER — HYDROMORPHONE HCL 2 MG PO TABS
2.0000 mg | ORAL_TABLET | ORAL | Status: DC | PRN
  Administered 2015-12-21 – 2015-12-22 (×5): 4 mg via ORAL
  Administered 2015-12-22: 2 mg via ORAL
  Administered 2015-12-22 – 2015-12-23 (×5): 4 mg via ORAL
  Filled 2015-12-21 (×2): qty 2
  Filled 2015-12-21: qty 1
  Filled 2015-12-21 (×6): qty 2
  Filled 2015-12-21: qty 1
  Filled 2015-12-21 (×2): qty 2

## 2015-12-21 NOTE — Clinical Social Work Placement (Cosign Needed)
   CLINICAL SOCIAL WORK PLACEMENT  NOTE  Date:  12/21/2015  Patient Details  Name: Richard Lynch MRN: [id number] Date of Birth: January 04, 1951  Clinical Social Work is seeking post-discharge placement for this patient at the [place] level of care (*CSW will initial, date and re-position this form in  chart as items are completed):  Yes   Patient/family provided with [facility] Work Department's list of facilities offering this level of care within the geographic area requested by the patient (or if unable, by the patient's family).  Yes   Patient/family informed of their freedom to choose among providers that offer the needed level of care, that participate in Medicare, Medicaid or managed care program needed by the patient, have an available bed and are willing to accept the patient.  Yes   Patient/family informed of [facility]'s ownership interest in [facility] and [facility], as well as of the fact that they are under no obligation to receive care at these facilities.  PASRR submitted to EDS on       PASRR number received on       Existing PASRR number confirmed on       FL2 transmitted to all facilities in geographic area requested by pt/family on 12/21/15     FL2 transmitted to all facilities within larger geographic area on       Patient informed that his/her managed care company has contracts with or will negotiate with certain facilities, including the following:            Patient/family informed of bed offers received.  Patient chooses bed at       Physician recommends and patient chooses bed at      Patient to be transferred to   on  .  Patient to be transferred to facility by       Patient family notified on   of transfer.  Name of family member notified:        PHYSICIAN Please sign FL2     Additional Comment:    _______________________________________________ Leane Call, Student-SW 12/21/2015, 10:45 AM

## 2015-12-21 NOTE — Progress Notes (Signed)
Orthopedic Tech Progress Note Patient Details:  Richard Lynch 07-18-1951 [id number]  CPM Right Knee CPM Right Knee: On Right Knee Flexion (Degrees): 90 Right Knee Extension (Degrees): 0 Additional Comments: R foot elevated   [name] 12/21/2015, 1:42 PM

## 2015-12-21 NOTE — Discharge Instructions (Signed)
Information on my medicine - ELIQUIS (apixaban)  This medication education was reviewed with me or my healthcare representative as part of my discharge preparation.  The pharmacist that spoke with me during my hospital stay was:  Arty Baumgartner, [facility]  Why was Eliquis prescribed for you? Eliquis was prescribed for you to reduce the risk of blood clots forming after orthopedic surgery.    What do You need to know about Eliquis? Take your Eliquis TWICE DAILY - one tablet in the morning and one tablet in the evening with or without food.  It would be best to take the dose about the same time each day.  If you have difficulty swallowing the tablet whole please discuss with your pharmacist how to take the medication safely.  Take Eliquis exactly as prescribed by your doctor and DO NOT stop taking Eliquis without talking to the doctor who prescribed the medication.  Stopping without other medication to take the place of Eliquis may increase your risk of developing a clot.  After discharge, you should have regular check-up appointments with your healthcare provider that is prescribing your Eliquis.  What do you do if you miss a dose? If a dose of ELIQUIS is not taken at the scheduled time, take it as soon as possible on the same day and twice-daily administration should be resumed.  The dose should not be doubled to make up for a missed dose.  Do not take more than one tablet of ELIQUIS at the same time.  Important Safety Information A possible side effect of Eliquis is bleeding. You should call your healthcare provider right away if you experience any of the following: ? Bleeding from an injury or your nose that does not stop. ? Unusual colored urine (red or dark brown) or unusual colored stools (red or black). ? Unusual bruising for unknown reasons. ? A serious fall or if you hit your head (even if there is no bleeding).  Some medicines may interact with Eliquis and might  increase your risk of bleeding or clotting while on Eliquis. To help avoid this, consult your healthcare provider or pharmacist prior to using any new prescription or non-prescription medications, including herbals, vitamins, non-steroidal anti-inflammatory drugs (NSAIDs) and supplements.  This website has more information on Eliquis (apixaban): http://www.eliquis.com/eliquis/home

## 2015-12-21 NOTE — Progress Notes (Signed)
Physical Therapy Treatment Patient Details Name: Richard Lynch MRN: [id number] DOB: October 13, 1951 Today's Date: 12/21/2015    History of Present Illness Patient is a 65 y/o male with hx of HTN, OSA,Emphysema lung and high cholesterol s/p Rt TKA.     PT Comments    Patient is progressing well toward mobility goals with minimal c/o pain. Pt tolerated exercises well. Continue to progress as tolerated.   Follow Up Recommendations  SNF;Supervision - Intermittent     Equipment Recommendations  None recommended by PT    Recommendations for Other Services OT consult     Precautions / Restrictions Precautions Precautions: Knee Precaution Booklet Issued: No Precaution Comments: Reviewed no pillow under knee and precautions. Knee Immobilizer - Right: Other (comment) (discontinued; SLR with <10 degree lag) Restrictions Weight Bearing Restrictions: Yes RLE Weight Bearing: Weight bearing as tolerated    Mobility  Bed Mobility               General bed mobility comments: OOB in chair upon arrival  Transfers Overall transfer level: Needs assistance Equipment used: Rolling walker (2 wheeled) Transfers: Sit to/from Stand Sit to Stand: Min guard         General transfer comment: min guard for safety; carry over of safe hand placement and technique  Ambulation/Gait Ambulation/Gait assistance: Supervision Ambulation Distance (Feet): 150 Feet Assistive device: Rolling walker (2 wheeled) Gait Pattern/deviations: Step-through pattern;Decreased stance time - right;Decreased weight shift to right;Decreased stride length;Trunk flexed Gait velocity: decreased   General Gait Details: no knee buckling; slow and steady gait; cues for posture, position of RW, and encouragement to increase R LE weight bearing; pt with improved WB and symmetrical step lengths after initial ~4ft    Stairs            Wheelchair Mobility    Modified Rankin (Stroke Patients Only)       Balance  Overall balance assessment: Needs assistance Sitting-balance support: No upper extremity supported;Feet supported Sitting balance-Leahy Scale: Good     Standing balance support: During functional activity Standing balance-Leahy Scale: Fair                      Cognition Arousal/Alertness: Awake/alert Behavior During Therapy: WFL for tasks assessed/performed Overall Cognitive Status: Within Functional Limits for tasks assessed                      Exercises Total Joint Exercises Quad Sets: AROM;Both;10 reps;Seated Short Arc Quad: AROM;Right;10 reps;Seated Heel Slides: AROM;AAROM;Right;10 reps;Seated Hip ABduction/ADduction: AROM;Right;10 reps;Seated Straight Leg Raises: AROM;Right;5 reps;Seated Long Arc Quad: AROM;Right;10 reps;Seated Goniometric ROM: 3-75    General Comments        Pertinent Vitals/Pain Pain Assessment: Faces Faces Pain Scale: Hurts a little bit Pain Location: R knee Pain Descriptors / Indicators: Discomfort;Sore Pain Intervention(s): Monitored during session;Premedicated before session;Repositioned;Ice applied    Home Living                      Prior Function            PT Goals (current goals can now be found in the care plan section) Acute Rehab PT Goals Patient Stated Goal: none stated Progress towards PT goals: Progressing toward goals    Frequency  7X/week    PT Plan Current plan remains appropriate    Co-evaluation             End of Session Equipment Utilized During Treatment: Gait belt Activity  Tolerance: Patient tolerated treatment well Patient left: in chair;with call bell/phone within reach     Time: 1026-1050 PT Time Calculation (min) (ACUTE ONLY): 24 min  Charges:  $Gait Training: 8-22 mins $Therapeutic Exercise: 8-22 mins                    G Codes:      [place] April, PTA Pager: [phone]   12/21/2015, 11:52 AM

## 2015-12-21 NOTE — Clinical Social Work Note (Signed)
CSW received referral for SNF.  Case discussed with case manager, and plan is to discharge home with home health.  CSW to sign off please re-consult if social work needs arise.  Cyncere Sontag R. Enola Siebers, MSW, LCSWA [phone]  

## 2015-12-21 NOTE — Care Management Note (Signed)
Case Management Note  Patient Details  Name: Richard Lynch MRN: [id number] Date of Birth: May 07, 1951  Subjective/Objective:         S/p right total knee replacement           Action/Plan: Patient set up with Ardeen Fillers for HHPT by MD office. Initially PT recommended SNF but due to patient's progress, PT now recommending HHPT. Patient states that he will be going home with his sister [name] and she will be able to assist him after discharge, cell [phone], home [phone], [address] [place], [place] [address]. Contacted Trase Bunda at [facility] and gave her new address and phone numbers. [employer] will delivered CPM to new address. Patient stated that he has rolling walker and 3N1 from previous surgery.    Expected Discharge Date:                  Expected Discharge Plan:  [name]  In-House Referral:  Clinical Social Work  Discharge planning Services  CM Consult  Post Acute Care Choice:  Durable Medical Equipment, Home Health Choice offered to:  Patient  DME Arranged:  CPM DME Agency:  [employer]  HH Arranged:  PT HH Agency:  [facility]  Status of Service:  Completed, signed off  Medicare Important Message Given:    Date Medicare IM Given:    Medicare IM give by:    Date Additional Medicare IM Given:    Additional Medicare Important Message give by:     If discussed at [place] of Stay Meetings, dates discussed:    Additional Comments:  Nila Nephew, RN 12/21/2015, 2:08 PM

## 2015-12-21 NOTE — Progress Notes (Signed)
Subjective: 1 Day Post-Op Procedure(s) (LRB): TOTAL KNEE ARTHROPLASTY (Right) Patient reports pain as 8 on 0-10 scale.    Objective: Vital signs in last 24 hours: Temp:  [97.4 F (36.3 C)-98.3 F (36.8 C)] 98.1 F (36.7 C) (02/28 0640) Pulse Rate:  [69-87] 82 (02/28 0640) Resp:  [9-20] 18 (02/28 0640) BP: (104-132)/(55-76) 123/55 mmHg (02/28 0640) SpO2:  [95 %-98 %] 96 % (02/28 0640)  Intake/Output from previous day: 02/27 0701 - 02/28 0700 In: 3100 [I.V.:3000; IV Piggyback:100] Out: [id number] [Urine:3375; Blood:50] Intake/Output this shift:    No results for input(s): HGB in the last 72 hours. No results for input(s): WBC, RBC, HCT, PLT in the last 72 hours. No results for input(s): NA, K, CL, CO2, BUN, CREATININE, GLUCOSE, CALCIUM in the last 72 hours. No results for input(s): LABPT, INR in the last 72 hours.  ABD soft Neurovascular intact Sensation intact distally Dorsiflexion/Plantar flexion intact Incision: scant drainage  Assessment/Plan: 1 Day Post-Op Procedure(s) (LRB): TOTAL KNEE ARTHROPLASTY (Right)  Principal Problem:   Primary localized osteoarthritis of right knee Active Problems:   Bullous emphysema (HCC)   Multiple lung nodules on CT   Essential hypertension   OSA on CPAP   GERD (gastroesophageal reflux disease)   Septic olecranon bursitis Still requiring IV pain meds    Patient requests trying PO Dilaudid.  Will D/C oxycodone and Oxycontin.  Will add MSContin and Dilaudid Advance diet Up with therapy D/C IV fluids  Rikita Grabert J 12/21/2015, 9:24 AM

## 2015-12-21 NOTE — Progress Notes (Signed)
Occupational Therapy Evaluation/Discharge Patient Details Name: Richard Lynch MRN: [id number] DOB: [birth year] Today's Date: 12/21/2015    History of Present Illness Patient is a 65 y/o male with hx of HTN, OSA,Emphysema lung and high cholesterol s/p Rt TKA.    Clinical Impression   PTA, pt was independent with ADLs and mobility. Pt completed all ADLs and functional transfers at supervision level with min verbal cues for safety. Educated pt on compensatory strategies for ADLs, pain/edema management, energy conservation, gradually increasing activity level, and fall prevention strategies. Pt is currently planning on discharging to his sister's house who can provide 24/7 assistance. No OT follow up or DME recommendations at this time. All education has been completed and pt has no further questions. Pt is adequate for discharge from occupational therapy standpoint. OT signing off.    Follow Up Recommendations  No OT follow up;Supervision/Assistance - 24 hour    Equipment Recommendations  None recommended by OT    Recommendations for Other Services       Precautions / Restrictions Precautions Precautions: Knee Precaution Booklet Issued: No Precaution Comments: Reviewed not placing pillow, ice pack or other object underneath R knee Knee Immobilizer - Right: Other (comment) (discontinued; SLR with <10 degree lag) Restrictions Weight Bearing Restrictions: Yes RLE Weight Bearing: Weight bearing as tolerated      Mobility Bed Mobility               General bed mobility comments: Pt up in chair on OT arrival  Transfers Overall transfer level: Needs assistance Equipment used: Rolling walker (2 wheeled) Transfers: Sit to/from Stand Sit to Stand: Supervision         General transfer comment: Supervision for safety. Verbal cues for safe hand placement on seated surfaces    Balance Overall balance assessment: Needs assistance Sitting-balance support: No upper extremity  supported;Feet supported Sitting balance-Leahy Scale: Good     Standing balance support: Bilateral upper extremity supported;During functional activity Standing balance-Leahy Scale: Fair                              ADL Overall ADL's : Needs assistance/impaired     Grooming: Wash/dry hands;Supervision/safety;Standing           Upper Body Dressing : Supervision/safety;Sitting   Lower Body Dressing: Supervision/safety;Sit to/from stand;Cueing for compensatory techniques Lower Body Dressing Details (indicate cue type and reason): Cues to dress RLE first and undress it last Toilet Transfer: Supervision/safety;Cueing for safety;Ambulation;BSC;RW Toilet Transfer Details (indicate cue type and reason): BSC over toilet, cues to feel BSC on back of legs before reaching back to sit Toileting- Clothing Manipulation and Hygiene: Supervision/safety;Sit to/from stand   Tub/ Shower Transfer: Walk-in shower;Supervision/safety;Cueing for sequencing;Ambulation;Rolling walker Tub/Shower Transfer Details (indicate cue type and reason): cues for proper step sequence with RW Functional mobility during ADLs: Supervision/safety;Rolling walker General ADL Comments: Educated pt on compensatory strategies for ADLs, pain/edemea management, home safety, fall prevention, and gradually increasing activity level to decrease fall risk.      Vision Vision Assessment?: No apparent visual deficits   Perception     Praxis      Pertinent Vitals/Pain Pain Assessment: 0-10 Pain Score: 3  Faces Pain Scale: Hurts a little bit Pain Location: R knee Pain Descriptors / Indicators: Aching Pain Intervention(s): Limited activity within patient's tolerance;Monitored during session;Repositioned;Premedicated before session;Ice applied     Hand Dominance Left   Extremity/Trunk Assessment Upper Extremity Assessment Upper Extremity Assessment: Overall WFL for  tasks assessed   Lower Extremity  Assessment Lower Extremity Assessment: RLE deficits/detail RLE Deficits / Details: decreased ROM and strength as expected post op   Cervical / Trunk Assessment Cervical / Trunk Assessment: Normal   Communication Communication Communication: No difficulties   Cognition Arousal/Alertness: Awake/alert Behavior During Therapy: WFL for tasks assessed/performed Overall Cognitive Status: Within Functional Limits for tasks assessed                     General Comments       Exercises Exercises: Total Joint     Shoulder Instructions      Home Living Family/patient expects to be discharged to:: Private residence (sister's house) Living Arrangements: Other relatives (will stay with sister) Available Help at Discharge: Family;Available 24 hours/day Type of Home: House Home Access: Ramped entrance     Home Layout: One level     Bathroom Shower/Tub: Walk-in shower;Door   [employer] Toilet: Handicapped height Bathroom Accessibility: Yes How Accessible: Accessible via walker Home Equipment: 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 drives    OT Diagnosis: Acute pain   OT Problem List: Decreased strength;Decreased range of motion;Decreased activity tolerance;Impaired balance (sitting and/or standing);Decreased coordination;Decreased safety awareness;Decreased knowledge of use of DME or AE;Decreased knowledge of precautions;Pain   OT Treatment/Interventions:      OT Goals(Current goals can be found in the care plan section) Acute Rehab OT Goals Patient Stated Goal: to be independent OT Goal Formulation: With patient Time For Goal Achievement: 01/04/16 Potential to Achieve Goals: Good  OT Frequency:     Barriers  to D/C:            Co-evaluation              End of Session Equipment Utilized During Treatment: Gait belt;Rolling walker CPM Right Knee CPM Right Knee: Off Additional Comments: R foot elevated Nurse Communication: Mobility status  Activity Tolerance: Patient tolerated treatment well Patient left: in chair;with call bell/phone within reach   Time: 1152-1210 OT Time Calculation (min): 18 min Charges:  OT General Charges $OT Visit: 1 Procedure OT Evaluation $OT Eval Moderate Complexity: 1 Procedure G-Codes:    Redmond Baseman, OTR/L [phone] 12/21/2015, 12:44 PM

## 2015-12-21 NOTE — Progress Notes (Signed)
Physical Therapy Treatment Patient Details Name: Richard Lynch MRN: [id number] DOB: [birth year] Today's Date: 12/21/2015    History of Present Illness Patient is a 65 y/o male with hx of HTN, OSA,Emphysema lung and high cholesterol s/p Rt TKA.     PT Comments    Patient continues to do well with PT and demonstrated improved gait mechanics this session. Given pt's progress, pt plans to d/c to sister's home where she will be available to provide assistance 24 hours a day and works from home. Recommending HHPT for further skilled PT services to maximize independence and safety with mobility.   Follow Up Recommendations  Home health PT;Supervision - Intermittent     Equipment Recommendations  Rolling walker with 5" wheels;3in1 (PT)    Recommendations for Other Services OT consult     Precautions / Restrictions Precautions Precautions: Knee Precaution Booklet Issued: No Precaution Comments: Reviewed no pillow under knee and precautions. Knee Immobilizer - Right: Other (comment) (discontinued; SLR with <10 degree lag) Restrictions Weight Bearing Restrictions: Yes RLE Weight Bearing: Weight bearing as tolerated    Mobility  Bed Mobility Overal bed mobility: Needs Assistance Bed Mobility: Supine to Sit     Supine to sit: Supervision     General bed mobility comments: supervision for safety; cues for technique; HOB flat and no use of bedrails  Transfers Overall transfer level: Needs assistance Equipment used: Rolling walker (2 wheeled) Transfers: Sit to/from Stand Sit to Stand: Supervision         General transfer comment: supervision for safety; carry over of safe hand placement and technique  Ambulation/Gait Ambulation/Gait assistance: Supervision Ambulation Distance (Feet): 250 Feet Assistive device: Rolling walker (2 wheeled) Gait Pattern/deviations: Step-through pattern;Decreased stride length;Decreased stance time - right Gait velocity: decreased   General  Gait Details: pt with improved posture and carry over of cues from previous session; pt with increased cadence and ability to weight bear on R LE; cues for position of RW, knee flexion during initial swing phase, and R heel strike   Stairs            Wheelchair Mobility    Modified Rankin (Stroke Patients Only)       Balance     Sitting balance-Leahy Scale: Good       Standing balance-Leahy Scale: Fair                      Cognition Arousal/Alertness: Awake/alert Behavior During Therapy: WFL for tasks assessed/performed Overall Cognitive Status: Within Functional Limits for tasks assessed                      Exercises      General Comments General comments (skin integrity, edema, etc.): pt given HEP handout and R LE in zero degree foam; pt's plan is to d/c to sister's home where she is avialable for assistance 24 hr a day and works from home      Pertinent Vitals/Pain Pain Assessment: 0-10 Pain Score: 5  Pain Location: R knee Pain Descriptors / Indicators: Sore Pain Intervention(s): Monitored during session;Premedicated before session;Repositioned    Home Living                      Prior Function            PT Goals (current goals can now be found in the care plan section) Acute Rehab PT Goals Patient Stated Goal: none stated Progress towards PT goals: Progressing  toward goals    Frequency  7X/week    PT Plan Discharge plan needs to be updated    Co-evaluation             End of Session Equipment Utilized During Treatment: Gait belt Activity Tolerance: Patient tolerated treatment well Patient left: in chair;with call bell/phone within reach     Time: 1539-1559 PT Time Calculation (min) (ACUTE ONLY): 20 min  Charges:  $Gait Training: 8-22 mins                    G Codes:      Salina April, PTA Pager: [phone]   12/21/2015, 4:07 PM

## 2015-12-21 NOTE — NC FL2 (Signed)
[place] LEVEL OF CARE SCREENING TOOL     IDENTIFICATION  Patient Name: Richard Lynch Birthdate: [birth year] Sex: male Admission Date (Current Location): 12/20/2015  [place] and [place] Number:  Kathleen Argue  ([place] [id number]) Facility and Address:  [facility], [place] [address] [place], [place] [address]      Provider Number: [id number]  Attending Physician Name and Address:  [name], MD  Relative Name and Phone Number:   [name]- sister, [phone])    Current Level of Care: Hospital Recommended Level of Care: [place] Prior Approval Number:    Date Approved/Denied:   PASRR Number:    Discharge Plan: SNF    Current Diagnoses: Patient Active Problem List   Diagnosis Date Noted  . Primary localized osteoarthritis of right knee 12/08/2015  . Bullous emphysema ([place]) 11/02/2015  . Essential hypertension 11/02/2015  . Arthritis 11/02/2015  . Hyperlipidemia 11/02/2015  . OSA on CPAP 11/02/2015  . GERD (gastroesophageal reflux disease) 11/02/2015  . Septic olecranon bursitis 06/24/2015  . Multiple lung nodules on CT 06/30/2014    Orientation RESPIRATION BLADDER Height & Weight     Self, Time, Situation, Place  Normal Continent Weight: 212 lb (96.163 kg) Height:  5\' 11"  (180.3 cm)  BEHAVIORAL SYMPTOMS/MOOD NEUROLOGICAL BOWEL NUTRITION STATUS      Continent Diet (REGULAR)  AMBULATORY STATUS COMMUNICATION OF NEEDS Skin   Limited Assist   Surgical wounds (incision location: right leg)                       Personal Care Assistance Level of Assistance              Functional Limitations Info             SPECIAL CARE FACTORS FREQUENCY  PT (By licensed PT)     PT Frequency:  (7x/week)              Contractures      Additional Factors Info  Code Status, Allergies Code Status Info:  (NOT ON FILE) Allergies Info:  (NO KNOWN ALLERGIES)            Current Medications (12/21/2015):  This is the current hospital active medication list Current Facility-Administered Medications  Medication Dose Route Frequency Provider Last Rate Last Dose  . 0.9 % NaCl with KCl 20 mEq/ L  infusion   Intravenous Continuous Kirstin Shepperson, PA-C 100 mL/hr at 12/21/15 0349    . acetaminophen (TYLENOL) tablet 650 mg  650 mg Oral Q6H PRN Kirstin Shepperson, PA-C   650 mg at 12/20/15 2113   Or  . acetaminophen (TYLENOL) suppository 650 mg  650 mg Rectal Q6H PRN Kirstin Shepperson, PA-C      . alum & mag hydroxide-simeth (MAALOX/MYLANTA) 200-200-20 MG/5ML suspension 30 mL  30 mL Oral Q4H PRN Kirstin Shepperson, PA-C      . apixaban (ELIQUIS) tablet 2.5 mg  2.5 mg Oral Q12H Kirstin Shepperson, PA-C   2.5 mg at 12/21/15 [id number]  . atenolol (TENORMIN) tablet 25 mg  25 mg Oral Daily Kirstin Shepperson, PA-C   25 mg at 12/21/15 0941  . calcium carbonate (OSCAL) tablet 1,500 mg  1,500 mg Oral Q breakfast Kirstin Shepperson, PA-C   1,500 mg at 12/21/15 1003  . celecoxib (CELEBREX) capsule 200 mg  200 mg Oral Q12H Kirstin Shepperson, PA-C   200 mg at 12/21/15 0941  . chlorthalidone (HYGROTON) tablet 25 mg  25  mg Oral Daily Kirstin Shepperson, PA-C   25 mg at 12/21/15 0941  . cholecalciferol (VITAMIN D) tablet 1,000 Units  1,000 Units Oral Daily [name], MD   1,000 Units at 12/21/15 0940  . dexamethasone (DECADRON) injection 10 mg  10 mg Intravenous Q8H Kirstin Shepperson, PA-C   10 mg at 12/21/15 0942  . diphenhydrAMINE (BENADRYL) 12.5 MG/5ML elixir 12.5-25 mg  12.5-25 mg Oral Q4H PRN Kirstin Shepperson, PA-C      . docusate sodium (COLACE) capsule 100 mg  100 mg Oral BID Kirstin Shepperson, PA-C   100 mg at 12/21/15 0940  . fenofibrate tablet 160 mg  160 mg Oral Daily Kirstin Shepperson, PA-C   160 mg at 12/21/15 0941  . HYDROmorphone (DILAUDID) injection 1 mg  1 mg Intravenous Q2H PRN Kirstin Shepperson, PA-C   1 mg at 12/21/15 [id number]  . HYDROmorphone (DILAUDID)  tablet 2-4 mg  2-4 mg Oral Q3H PRN Kirstin Shepperson, PA-C   4 mg at 12/21/15 1001  . ipratropium-albuterol (DUONEB) 0.5-2.5 (3) MG/3ML nebulizer solution 3 mL  3 mL Nebulization Q4H PRN [name], MD      . Derrill Memo ON 12/22/2015] irbesartan (AVAPRO) tablet 150 mg  150 mg Oral Daily Kirstin Shepperson, PA-C      . LORazepam (ATIVAN) tablet 1 mg  1 mg Oral QHS Kirstin Shepperson, PA-C   1 mg at 12/20/15 2200  . menthol-cetylpyridinium (CEPACOL) lozenge 3 mg  1 lozenge Oral PRN Kirstin Shepperson, PA-C       Or  . phenol (CHLORASEPTIC) mouth spray 1 spray  1 spray Mouth/Throat PRN Kirstin Shepperson, PA-C      . metoCLOPramide (REGLAN) tablet 5-10 mg  5-10 mg Oral Q8H PRN Kirstin Shepperson, PA-C       Or  . metoCLOPramide (REGLAN) injection 5-10 mg  5-10 mg Intravenous Q8H PRN Kirstin Shepperson, PA-C      . morphine (MS CONTIN) 12 hr tablet 15 mg  15 mg Oral Q12H Kirstin Shepperson, PA-C   15 mg at 12/21/15 1000  . ondansetron (ZOFRAN) tablet 4 mg  4 mg Oral Q6H PRN Kirstin Shepperson, PA-C       Or  . ondansetron (ZOFRAN) injection 4 mg  4 mg Intravenous Q6H PRN Kirstin Shepperson, PA-C      . pantoprazole (PROTONIX) EC tablet 40 mg  40 mg Oral Daily Kirstin Shepperson, PA-C   40 mg at 12/21/15 0940  . polyethylene glycol (MIRALAX / GLYCOLAX) packet 17 g  17 g Oral BID Kirstin Shepperson, PA-C   17 g at 12/20/15 1500  . pravastatin (PRAVACHOL) tablet 40 mg  40 mg Oral q1800 Kirstin Shepperson, PA-C   40 mg at 12/20/15 1715  . valACYclovir (VALTREX) tablet 500 mg  500 mg Oral Daily Kirstin Shepperson, PA-C   500 mg at 12/21/15 0941  . vitamin C (ASCORBIC ACID) tablet 1,000 mg  1,000 mg Oral Daily [name], MD   1,000 mg at 12/21/15 [id number]     Discharge Medications: Please see discharge summary for a list of discharge medications.  Relevant Imaging Results:  Relevant Lab Results:   Additional Information  (SSN: [id number])  Leane Call, Student-SW [phone]

## 2015-12-22 ENCOUNTER — Encounter (HOSPITAL_COMMUNITY): Payer: Self-pay | Admitting: Physician Assistant

## 2015-12-22 DIAGNOSIS — T402X5A Adverse effect of other opioids, initial encounter: Secondary | ICD-10-CM | POA: Diagnosis not present

## 2015-12-22 DIAGNOSIS — K5903 Drug induced constipation: Secondary | ICD-10-CM

## 2015-12-22 HISTORY — DX: Adverse effect of other opioids, initial encounter: T40.2X5A

## 2015-12-22 HISTORY — DX: Drug induced constipation: K59.03

## 2015-12-22 LAB — BASIC METABOLIC PANEL
Anion gap: 9 (ref 5–15)
BUN: 20 mg/dL (ref 6–20)
CO2: 28 mmol/L (ref 22–32)
Calcium: 8.7 mg/dL — ABNORMAL LOW (ref 8.9–10.3)
Chloride: 102 mmol/L (ref 101–111)
Creatinine, Ser: 1.11 mg/dL (ref 0.61–1.24)
GFR calc Af Amer: >60 mL/min (ref >60)
GFR calc non Af Amer: >60 mL/min (ref >60)
Glucose, Bld: 180 mg/dL — ABNORMAL HIGH (ref 65–99)
Potassium: 3.9 mmol/L (ref 3.5–5.1)
Sodium: 139 mmol/L (ref 135–145)

## 2015-12-22 LAB — CBC
HCT: 28.2 % — ABNORMAL LOW (ref 39.0–52.0)
Hemoglobin: 9.2 g/dL — ABNORMAL LOW (ref 13.0–17.0)
MCH: 31.5 pg (ref 26.0–34.0)
MCHC: 32.6 g/dL (ref 30.0–36.0)
MCV: 96.6 fL (ref 78.0–100.0)
Platelets: 319 10*3/uL (ref 150–400)
RBC: 2.92 MIL/uL — ABNORMAL LOW (ref 4.22–5.81)
RDW: 12.9 % (ref 11.5–15.5)
WBC: 17.5 10*3/uL — ABNORMAL HIGH (ref 4.0–10.5)

## 2015-12-22 MED ORDER — IPRATROPIUM-ALBUTEROL 0.5-2.5 (3) MG/3ML IN SOLN
3.0000 mL | Freq: Four times a day (QID) | RESPIRATORY_TRACT | Status: DC
  Administered 2015-12-22 (×3): 3 mL via RESPIRATORY_TRACT
  Filled 2015-12-22 (×3): qty 3

## 2015-12-22 MED ORDER — IPRATROPIUM-ALBUTEROL 0.5-2.5 (3) MG/3ML IN SOLN
3.0000 mL | Freq: Four times a day (QID) | RESPIRATORY_TRACT | Status: DC | PRN
  Administered 2015-12-23: 3 mL via RESPIRATORY_TRACT
  Filled 2015-12-22: qty 3

## 2015-12-22 MED ORDER — NALOXEGOL OXALATE 25 MG PO TABS
25.0000 mg | ORAL_TABLET | Freq: Every day | ORAL | Status: DC
  Administered 2015-12-22 – 2015-12-23 (×2): 25 mg via ORAL
  Filled 2015-12-22 (×3): qty 1

## 2015-12-22 NOTE — Progress Notes (Signed)
Physical Therapy Treatment Patient Details Name: Richard Lynch MRN: [id number] DOB: 08-07-1951 Today's Date: 12/22/2015    History of Present Illness Patient is a 65 y/o male with hx of HTN, OSA,Emphysema lung and high cholesterol s/p Rt TKA.     PT Comments    Pt performed gait with improved gait pattern.  Education provided for correct technique with therapeutic exercise.  Pt with CP applied to right knee to reduce pain.    Follow Up Recommendations  Home health PT;Supervision - Intermittent     Equipment Recommendations  Rolling walker with 5" wheels;3in1 (PT)    Recommendations for Other Services       Precautions / Restrictions Precautions Precautions: Knee Precaution Comments: Reviewed no pillow under knee and precautions. Knee Immobilizer - Right: Other (comment) (Knee Immobilizer: discontinued.) Restrictions Weight Bearing Restrictions: Yes RLE Weight Bearing: Weight bearing as tolerated    Mobility  Bed Mobility Overal bed mobility:  (Pt received in recliner on arrival.  )             General bed mobility comments: OOB in chair upon arrival  Transfers Overall transfer level: Needs assistance Equipment used: Rolling walker (2 wheeled) Transfers: Sit to/from Stand Sit to Stand: Supervision Stand pivot transfers: Supervision       General transfer comment: Cues for hand and foot placement.    Ambulation/Gait Ambulation/Gait assistance: Supervision Ambulation Distance (Feet): 400 Feet Assistive device: Rolling walker (2 wheeled) Gait Pattern/deviations: Step-through pattern;Decreased stance time - right;Decreased stride length;Antalgic Gait velocity: decreased   General Gait Details: Cues for weight shifting and step length to improve gait symmtery with step through pattern.     Stairs            Wheelchair Mobility    Modified Rankin (Stroke Patients Only)       Balance Overall balance assessment: Needs assistance Sitting-balance  support: No upper extremity supported;Feet supported Sitting balance-Leahy Scale: Good       Standing balance-Leahy Scale: Good                      Cognition Arousal/Alertness: Awake/alert Behavior During Therapy: WFL for tasks assessed/performed Overall Cognitive Status: Within Functional Limits for tasks assessed                      Exercises Total Joint Exercises Ankle Circles/Pumps: Both;Supine;20 reps (reclined) Quad Sets: AROM;Right;10 reps;Supine (reclined) Gluteal Sets: AROM;Both;10 reps;Supine (reclined) Heel Slides: AAROM;Right;10 reps;Supine (reclined with gait belt to improve ROM and complete ROM. ) Hip ABduction/ADduction: AAROM;Right;10 reps;Supine (reclined with gait belt) Straight Leg Raises: AROM;Right;10 reps;Supine (reclined) Long Arc Quad: AROM;Right;10 reps;Seated    General Comments        Pertinent Vitals/Pain Pain Assessment: 0-10 Pain Score: 4  Pain Location: R knee Pain Descriptors / Indicators: Sore Pain Intervention(s): Monitored during session;Premedicated before session;Repositioned;Ice applied    Home Living                      Prior Function            PT Goals (current goals can now be found in the care plan section) Acute Rehab PT Goals Patient Stated Goal: none stated Potential to Achieve Goals: Good Progress towards PT goals: Progressing toward goals    Frequency  7X/week    PT Plan Discharge plan needs to be updated    Co-evaluation  End of Session Equipment Utilized During Treatment: Gait belt   Patient left: in chair;with call bell/phone within reach     Time: 1437-1508 PT Time Calculation (min) (ACUTE ONLY): 31 min  Charges:  $Gait Training: 8-22 mins $Therapeutic Exercise: 8-22 mins                    G Codes:      Cristela Blue 12-27-15, 3:25 PM Governor Rooks, PTA pager [phone]

## 2015-12-22 NOTE — Progress Notes (Signed)
Physical Therapy Treatment Patient Details Name: Richard Lynch MRN: [id number] DOB: 1951/03/12 Today's Date: 12/22/2015    History of Present Illness Patient is a 65 y/o male with hx of HTN, OSA,Emphysema lung and high cholesterol s/p Rt TKA.     PT Comments    Patient continues to progress toward mobility goals. Pt tolerated increase repetitions of therex and improved ROM this session. Current plan remains appropriate.   Follow Up Recommendations  Home health PT;Supervision - Intermittent     Equipment Recommendations  Rolling walker with 5" wheels;3in1 (PT)    Recommendations for Other Services OT consult     Precautions / Restrictions Precautions Precautions: Knee Precaution Booklet Issued: No Precaution Comments: Reviewed no pillow under knee and precautions. Knee Immobilizer - Right: Other (comment) (discontinued; SLR with <10 degree lag) Restrictions Weight Bearing Restrictions: Yes RLE Weight Bearing: Weight bearing as tolerated    Mobility  Bed Mobility               General bed mobility comments: OOB in chair upon arrival  Transfers Overall transfer level: Needs assistance Equipment used: Rolling walker (2 wheeled) Transfers: Sit to/from Stand Sit to Stand: Supervision         General transfer comment: safe hand placement and technique; supervision for safety  Ambulation/Gait Ambulation/Gait assistance: Supervision Ambulation Distance (Feet): 350 Feet Assistive device: Rolling walker (2 wheeled) Gait Pattern/deviations: Step-through pattern;Decreased dorsiflexion - right;Antalgic;Trunk flexed Gait velocity: decreased   General Gait Details: cues for posture, increased R knee flexion dueing swing phase, and R heel strike   Stairs            Wheelchair Mobility    Modified Rankin (Stroke Patients Only)       Balance     Sitting balance-Leahy Scale: Good       Standing balance-Leahy Scale: Fair                       Cognition Arousal/Alertness: Awake/alert Behavior During Therapy: WFL for tasks assessed/performed Overall Cognitive Status: Within Functional Limits for tasks assessed                      Exercises Total Joint Exercises Quad Sets: AROM;Both;15 reps;Seated Short Arc Quad: AROM;Right;15 reps;Seated Heel Slides: AROM;AAROM;Right;15 reps;Seated Hip ABduction/ADduction: AROM;Right;15 reps;Seated Straight Leg Raises: AROM;Right;15 reps;Seated Goniometric ROM: 0-85    General Comments        Pertinent Vitals/Pain Pain Assessment: Faces Faces Pain Scale: Hurts little more Pain Location: R knee with flexion Pain Descriptors / Indicators: Sore Pain Intervention(s): Monitored during session;Premedicated before session;Repositioned;Ice applied    Home Living                      Prior Function            PT Goals (current goals can now be found in the care plan section) Acute Rehab PT Goals Patient Stated Goal: none stated Progress towards PT goals: Progressing toward goals    Frequency  7X/week    PT Plan Discharge plan needs to be updated    Co-evaluation             End of Session Equipment Utilized During Treatment: Gait belt Activity Tolerance: Patient tolerated treatment well Patient left: in chair;with call bell/phone within reach     Time: 1013-1044 PT Time Calculation (min) (ACUTE ONLY): 31 min  Charges:  $Gait Training: 8-22 mins $Therapeutic Exercise: 8-22 mins  G Codes:      [place] April, PTA Pager: [phone]   12/22/2015, 10:57 AM

## 2015-12-22 NOTE — Progress Notes (Signed)
Subjective: 2 Days Post-Op Procedure(s) (LRB): TOTAL KNEE ARTHROPLASTY (Right) Patient reports pain as 7 on 0-10 scale.    Objective: Vital signs in last 24 hours: Temp:  [97.9 F (36.6 C)-98.1 F (36.7 C)] 97.9 F (36.6 C) (03/01 0500) Pulse Rate:  [86-90] 88 (03/01 0500) Resp:  [18] 18 (03/01 0500) BP: (120-136)/(57-80) 122/57 mmHg (03/01 0500) SpO2:  [95 %-97 %] 95 % (03/01 0500)  Intake/Output from previous day: 02/28 0701 - 03/01 0700 In: 555 [P.O.:555] Out: 1300 [Urine:1300] Intake/Output this shift:     Recent Labs  12/21/15 0755 12/22/15 0440  HGB 10.7* 9.2*    Recent Labs  12/21/15 0755 12/22/15 0440  WBC 21.2* 17.5*  RBC 3.25* 2.92*  HCT 31.2* 28.2*  PLT 350 319    Recent Labs  12/21/15 0755 12/22/15 0440  NA 140 139  K 3.9 3.9  CL 106 102  CO2 25 28  BUN 22* 20  CREATININE 1.21 1.11  GLUCOSE 179* 180*  CALCIUM 8.6* 8.7*   No results for input(s): LABPT, INR in the last 72 hours.  ABD soft Neurovascular intact Dorsiflexion/Plantar flexion intact Incision: scant drainage  Assessment/Plan: 2 Days Post-Op Procedure(s) (LRB): TOTAL KNEE ARTHROPLASTY (Right)  Principal Problem:   Primary localized osteoarthritis of right knee Active Problems:   Bullous emphysema (HCC)   Multiple lung nodules on CT   Essential hypertension   OSA on CPAP   GERD (gastroesophageal reflux disease)   Septic olecranon bursitis   Constipation due to opioid therapy   Will schedule nebulizer due to increased WBC count to prevent post operative pneumonia in this paint with chronic emphysema Will add Movantic due to constipation Advance diet Up with therapy Plan for discharge tomorrow  Linda Hedges 12/22/2015, 7:31 AM

## 2015-12-23 LAB — CBC
HCT: 27.3 % — ABNORMAL LOW (ref 39.0–52.0)
Hemoglobin: 9.1 g/dL — ABNORMAL LOW (ref 13.0–17.0)
MCH: 32.3 pg (ref 26.0–34.0)
MCHC: 33.3 g/dL (ref 30.0–36.0)
MCV: 96.8 fL (ref 78.0–100.0)
Platelets: 306 10*3/uL (ref 150–400)
RBC: 2.82 MIL/uL — ABNORMAL LOW (ref 4.22–5.81)
RDW: 12.8 % (ref 11.5–15.5)
WBC: 12.1 10*3/uL — ABNORMAL HIGH (ref 4.0–10.5)

## 2015-12-23 LAB — BASIC METABOLIC PANEL
Anion gap: 10 (ref 5–15)
BUN: 23 mg/dL — ABNORMAL HIGH (ref 6–20)
CO2: 29 mmol/L (ref 22–32)
Calcium: 8.8 mg/dL — ABNORMAL LOW (ref 8.9–10.3)
Chloride: 101 mmol/L (ref 101–111)
Creatinine, Ser: 1.19 mg/dL (ref 0.61–1.24)
GFR calc Af Amer: >60 mL/min (ref >60)
GFR calc non Af Amer: >60 mL/min (ref >60)
Glucose, Bld: 123 mg/dL — ABNORMAL HIGH (ref 65–99)
Potassium: 3.5 mmol/L (ref 3.5–5.1)
Sodium: 140 mmol/L (ref 135–145)

## 2015-12-23 MED ORDER — CELECOXIB 200 MG PO CAPS: ORAL_CAPSULE | ORAL | Status: DC

## 2015-12-23 MED ORDER — HYDROMORPHONE HCL 2 MG PO TABS: ORAL_TABLET | ORAL | Status: DC

## 2015-12-23 MED ORDER — DOCUSATE SODIUM 100 MG PO CAPS: ORAL_CAPSULE | ORAL | Status: DC

## 2015-12-23 MED ORDER — NALOXEGOL OXALATE 25 MG PO TABS: 25.0000 mg | ORAL_TABLET | Freq: Every day | ORAL | Status: DC

## 2015-12-23 MED ORDER — POLYETHYLENE GLYCOL 3350 17 G PO PACK: PACK | ORAL | Status: DC

## 2015-12-23 MED ORDER — MORPHINE SULFATE ER 15 MG PO TBCR: EXTENDED_RELEASE_TABLET | ORAL | Status: DC

## 2015-12-23 MED ORDER — APIXABAN 2.5 MG PO TABS: 2.5000 mg | ORAL_TABLET | Freq: Two times a day (BID) | ORAL | Status: DC

## 2015-12-23 MED ORDER — LORAZEPAM 1 MG PO TABS: ORAL_TABLET | ORAL | Status: DC

## 2015-12-23 NOTE — Progress Notes (Signed)
Pt ready for discharge. Education/instructions reviewed with pt and all questions/concerns addressed. IV removed and belongings gathered. Pt will be transported out via wheelchair to sister's car. Will continue to monitor.  

## 2015-12-23 NOTE — Progress Notes (Signed)
Orthopedic Tech Progress Note Patient Details:  Richard Lynch 01/22/1951 [id number]  CPM Right Knee CPM Right Knee: On Right Knee Flexion (Degrees): 90 Right Knee Extension (Degrees): 0 Additional Comments: trapeze bar helper   [name] 12/23/2015, 2:32 PM

## 2015-12-23 NOTE — Progress Notes (Signed)
Physical Therapy Treatment Patient Details Name: Richard Lynch MRN: [id number] DOB: June 29, 1951 Today's Date: 12/23/2015    History of Present Illness Patient is a 65 y/o male with hx of HTN, OSA,Emphysema lung and high cholesterol s/p Rt TKA.     PT Comments    Patient is making good progress with PT.  From a mobility standpoint anticipate patient will be ready for DC home when medically ready.     Follow Up Recommendations  Home health PT;Supervision - Intermittent     Equipment Recommendations  Rolling walker with 5" wheels;3in1 (PT)    Recommendations for Other Services OT consult     Precautions / Restrictions Precautions Precautions: Knee Precaution Booklet Issued: No Precaution Comments: Reviewed no pillow under knee and precautions. Knee Immobilizer - Right: Other (comment) (discontinued; SLR with <10 degree lag) Restrictions Weight Bearing Restrictions: Yes RLE Weight Bearing: Weight bearing as tolerated    Mobility  Bed Mobility               General bed mobility comments: OOB in chair upon arrival  Transfers Overall transfer level: Needs assistance Equipment used: Rolling walker (2 wheeled) Transfers: Sit to/from Stand Sit to Stand: Supervision         General transfer comment: carry over of safe hand placement and technique  Ambulation/Gait Ambulation/Gait assistance: Supervision Ambulation Distance (Feet): 400 Feet Assistive device: Rolling walker (2 wheeled) Gait Pattern/deviations: Step-through pattern;Decreased stride length;Decreased stance time - right;Trunk flexed Gait velocity: decreased   General Gait Details: cues for posture and position in RW; pt with improved gait mechanics this session specifically improved R knee flexion and foot clearance   Stairs            Wheelchair Mobility    Modified Rankin (Stroke Patients Only)       Balance     Sitting balance-Leahy Scale: Good       Standing balance-Leahy Scale:  Good                      Cognition Arousal/Alertness: Awake/alert Behavior During Therapy: WFL for tasks assessed/performed Overall Cognitive Status: Within Functional Limits for tasks assessed                      Exercises Total Joint Exercises Quad Sets: AROM;Both;15 reps;Seated Short Arc Quad: AROM;Right;15 reps;Seated Heel Slides: AROM;Right;15 reps;Seated Hip ABduction/ADduction: AROM;Right;15 reps;Seated Straight Leg Raises: AROM;Right;15 reps;Seated Long Arc Quad: AROM;Right;15 reps;Seated Knee Flexion: AROM;Right;15 reps;Seated Goniometric ROM: 0-90    General Comments        Pertinent Vitals/Pain Pain Assessment: 0-10 Pain Score: 2  Pain Location: R knee Pain Descriptors / Indicators: Sore Pain Intervention(s): Limited activity within patient's tolerance;Monitored during session;Premedicated before session;Repositioned;Patient requesting pain meds-RN notified;Ice applied    Home Living                      Prior Function            PT Goals (current goals can now be found in the care plan section) Acute Rehab PT Goals Patient Stated Goal: none stated Progress towards PT goals: Progressing toward goals    Frequency  7X/week    PT Plan Current plan remains appropriate    Co-evaluation             End of Session Equipment Utilized During Treatment: Gait belt Activity Tolerance: Patient tolerated treatment well Patient left: in chair;with call bell/phone within reach  Time: 0835-0900 PT Time Calculation (min) (ACUTE ONLY): 25 min  Charges:  $Gait Training: 8-22 mins $Therapeutic Exercise: 8-22 mins                    G Codes:      [place] April, PTA Pager: [phone]   12/23/2015, 9:06 AM

## 2015-12-23 NOTE — Discharge Summary (Signed)
Patient ID: Richard Lynch MRN: [id number] DOB/AGE: 63-Nov-1952 63 y.o.  Admit date: 12/20/2015 Discharge date: 12/23/2015  Admission Diagnoses:  Principal Problem:   Primary localized osteoarthritis of right knee Active Problems:   Bullous emphysema (HCC)   Multiple lung nodules on CT   Essential hypertension   OSA on CPAP   GERD (gastroesophageal reflux disease)   Septic olecranon bursitis   Constipation due to opioid therapy   Discharge Diagnoses:  Same  Past Medical History  Diagnosis Date  . Hypertension   . High cholesterol   . Emphysema lung ([place])   . OSA (obstructive sleep apnea)     on CPAP  . GERD (gastroesophageal reflux disease)   . Primary localized osteoarthritis of right knee 12/08/2015  . Septic olecranon bursitis 06/2015    MSSA  . Constipation due to opioid therapy 12/22/2015    Surgeries: Procedure(s): TOTAL KNEE ARTHROPLASTY on 12/20/2015   Consultants:    Discharged Condition: Improved  Hospital Course: Seanpaul C Lynch is an 65 y.o. male who was admitted 12/20/2015 for operative treatment ofPrimary localized osteoarthritis of right knee. Patient has severe unremitting pain that affects sleep, daily activities, and work/hobbies. After pre-op clearance the patient was taken to the operating room on 12/20/2015 and underwent  Procedure(s): TOTAL KNEE ARTHROPLASTY.    Patient was given perioperative antibiotics: Anti-infectives    Start     Dose/Rate Route Frequency Ordered Stop   12/20/15 1300  valACYclovir (VALTREX) tablet 500 mg     500 mg Oral Daily 12/20/15 1248     12/20/15 1300  ceFAZolin (ANCEF) IVPB 2 g/50 mL premix     2 g 100 mL/hr over 30 Minutes Intravenous Every 6 hours 12/20/15 1248 12/20/15 2043   12/20/15 0600  ceFAZolin (ANCEF) IVPB 2 g/50 mL premix     2 g 100 mL/hr over 30 Minutes Intravenous On call to O.R. 12/19/15 1441 12/20/15 0730       Patient was given sequential compression devices, early ambulation, and chemoprophylaxis  to prevent DVT.  This patient was kept an extra day in the hospital due to his leukocytosis.  Once he received scheduled nebulizers of his albuterol then his white cell count declined.  He is doing very well with independent mobilization.  Patient benefited maximally from hospital stay and there were no complications.    Recent vital signs: Patient Vitals for the past 24 hrs:  BP Temp Temp src Pulse Resp SpO2  12/23/15 0510 122/62 mmHg 97.4 F (36.3 C) Oral 81 18 93 %  12/22/15 2027 (!) 119/59 mmHg 98.4 F (36.9 C) Oral 84 - 94 %  12/22/15 1531 - - - - - 92 %  12/22/15 1426 (!) 109/53 mmHg 98.2 F (36.8 C) - 77 18 95 %  12/22/15 0919 - - - - - 94 %     Recent laboratory studies:  Recent Labs  12/22/15 0440 12/23/15 0400  WBC 17.5* 12.1*  HGB 9.2* 9.1*  HCT 28.2* 27.3*  PLT 319 306  NA 139 140  K 3.9 3.5  CL 102 101  CO2 28 29  BUN 20 23*  CREATININE 1.11 1.19  GLUCOSE 180* 123*  CALCIUM 8.7* 8.8*     Discharge Medications:     Medication List    STOP taking these medications        amphetamine-dextroamphetamine 20 MG tablet  Commonly known as:  ADDERALL     aspirin EC 81 MG tablet     cyanocobalamin 1000 MCG  tablet     meloxicam 15 MG tablet  Commonly known as:  MOBIC     nicotine 21 mg/24hr patch  Commonly known as:  NICODERM CQ - dosed in mg/24 hours     vitamin E 400 UNIT capsule      TAKE these medications        apixaban 2.5 MG Tabs tablet  Commonly known as:  ELIQUIS  Take 1 tablet (2.5 mg total) by mouth every 12 (twelve) hours.     atenolol 25 MG tablet  Commonly known as:  TENORMIN  Take 25 mg by mouth daily.     CALCIUM 600 PO  Take 1 tablet by mouth daily.     candesartan 32 MG tablet  Commonly known as:  ATACAND  Take 32 mg by mouth daily.     celecoxib 200 MG capsule  Commonly known as:  CELEBREX  1 tab po q day with food for pain and  swelling     chlorthalidone 25 MG tablet  Commonly known as:  HYGROTON  Take 25 mg by  mouth daily.     COMBIVENT RESPIMAT 20-100 MCG/ACT Aers respimat  Generic drug:  Ipratropium-Albuterol  Inhale 1 puff into the lungs every 6 (six) hours as needed for wheezing. As needed     DEXILANT 60 MG capsule  Generic drug:  dexlansoprazole  Take 60 mg by mouth daily. Once daily     docusate sodium 100 MG capsule  Commonly known as:  COLACE  1 tab 2 times a day while on narcotics.  STOOL SOFTENER     fenofibrate 160 MG tablet  Take 160 mg by mouth daily.     HYDROmorphone 2 MG tablet  Commonly known as:  DILAUDID  1-2 tablets every 3-4 hrs as needed for  Breakthrough pain.  SHORT ACTING PAIN MEDICATION.  DO NOT GIVE WITHIN 3 HRS OF LONG ACTING PAIN MEDICATION     LIVALO 2 MG Tabs  Generic drug:  Pitavastatin Calcium  Take 2 mg by mouth daily.     LORazepam 1 MG tablet  Commonly known as:  ATIVAN  1 PO QHS AT NIGHT FOR SLEEP AND MUSCLE SPASM     morphine 15 MG 12 hr tablet  Commonly known as:  MS CONTIN  1 TABLET EVERY 12 HRS.  LONG ACTING PAIN MEDICATION.   DO NOT TAKE AT THE SAME TIME AS THE SHORT ACTING PAIN MEDICATION     naloxegol oxalate 25 MG Tabs tablet  Commonly known as:  MOVANTIK  Take 1 tablet (25 mg total) by mouth daily. FOR OPIOID CONSTIPATION     polyethylene glycol packet  Commonly known as:  MIRALAX / GLYCOLAX  17grams in 4 oz of water twice a day until bowel movement.  LAXITIVE.  Restart if two days since last bowel movement     valACYclovir 500 MG tablet  Commonly known as:  VALTREX  Take 500 mg by mouth daily.     vitamin C 100 MG tablet  Take 100 mg by mouth daily.     Vitamin D 2000 units tablet  Take 4,000 Units by mouth daily.        Diagnostic Studies: No results found.  Disposition:       Discharge Instructions    CPM    Complete by:  As directed   Continuous passive motion machine (CPM):      Use the CPM from 0 to 90 for 6 hours per day.  You may break it up into 2 or 3 sessions per day.      Use CPM for 2 weeks or  until you are told to stop.     Call MD / Call 911    Complete by:  As directed   If you experience chest pain or shortness of breath, CALL 911 and be transported to the hospital emergency room.  If you develope a fever above 101 F, pus (white drainage) or increased drainage or redness at the wound, or calf pain, call your surgeon's office.     Change dressing    Complete by:  As directed   Change the gauze dressing daily with sterile 4 x 4 inch gauze and apply TED hose.  DO NOT REMOVE BANDAGE OVER SURGICAL INCISION.  [place] WHOLE LEG INCLUDING OVER THE WATERPROOF BANDAGE WITH SOAP AND WATER EVERY DAY.     Constipation Prevention    Complete by:  As directed   Drink plenty of fluids.  Prune juice may be helpful.  You may use a stool softener, such as Colace (over the counter) 100 mg twice a day.  Use MiraLax (over the counter) for constipation as needed.     Diet - low sodium heart healthy    Complete by:  As directed      Discharge instructions    Complete by:  As directed   INSTRUCTIONS AFTER JOINT REPLACEMENT   Remove items at home which could result in a fall. This includes throw rugs or furniture in walking pathways ICE to the affected joint every three hours while awake for 30 minutes at a time, for at least the first 3-5 days, and then as needed for pain and swelling.  Continue to use ice for pain and swelling. You may notice swelling that will progress down to the foot and ankle.  This is normal after surgery.  Elevate your leg when you are not up walking on it.   Continue to use the breathing machine you got in the hospital (incentive spirometer) which will help keep your temperature down.  It is common for your temperature to cycle up and down following surgery, especially at night when you are not up moving around and exerting yourself.  The breathing machine keeps your lungs expanded and your temperature down.   DIET:  As you were doing prior to hospitalization, we recommend a  well-balanced diet.  DRESSING / WOUND CARE / SHOWERING  Keep the surgical dressing until follow up.  The dressing is water proof, so you can shower without any extra covering.  IF THE DRESSING FALLS OFF or the wound gets wet inside, change the dressing with sterile gauze.  Please use good hand washing techniques before changing the dressing.  Do not use any lotions or creams on the incision until instructed by your surgeon.    ACTIVITY  Increase activity slowly as tolerated, but follow the weight bearing instructions below.   No driving for 6 weeks or until further direction given by your physician.  You cannot drive while taking narcotics.  No lifting or carrying greater than 10 lbs. until further directed by your surgeon. Avoid periods of inactivity such as sitting longer than an hour when not asleep. This helps prevent blood clots.  You may return to work once you are authorized by your doctor.     WEIGHT BEARING   Weight bearing as tolerated with assist device (walker, cane, etc) as directed, use it as long as suggested by  your surgeon or therapist, typically at least 2-3 weeks.   EXERCISES  Results after joint replacement surgery are often greatly improved when you follow the exercise, range of motion and muscle strengthening exercises prescribed by your doctor. Safety measures are also important to protect the joint from further injury. Any time any of these exercises cause you to have increased pain or swelling, decrease what you are doing until you are comfortable again and then slowly increase them. If you have problems or questions, call your caregiver or physical therapist for advice.   Rehabilitation is important following a joint replacement. After just a few days of immobilization, the muscles of the leg can become weakened and shrink (atrophy).  These exercises are designed to build up the tone and strength of the thigh and leg muscles and to improve motion. Often times heat  used for twenty to thirty minutes before working out will loosen up your tissues and help with improving the range of motion but do not use heat for the first two weeks following surgery (sometimes heat can increase post-operative swelling).   These exercises can be done on a training (exercise) mat, on the floor, on a table or on a bed. Use whatever works the best and is most comfortable for you.    Use music or television while you are exercising so that the exercises are a pleasant break in your day. This will make your life better with the exercises acting as a break in your routine that you can look forward to.   Perform all exercises about fifteen times, three times per day or as directed.  You should exercise both the operative leg and the other leg as well.   Exercises include:  Quad Sets - Tighten up the muscle on the front of the thigh (Quad) and hold for 5-10 seconds.   Straight Leg Raises - With your knee straight (if you were given a brace, keep it on), lift the leg to 60 degrees, hold for 3 seconds, and slowly lower the leg.  Perform this exercise against resistance later as your leg gets stronger.  Leg Slides: Lying on your back, slowly slide your foot toward your buttocks, bending your knee up off the floor (only go as far as is comfortable). Then slowly slide your foot back down until your leg is flat on the floor again.  Angel Wings: Lying on your back spread your legs to the side as far apart as you can without causing discomfort.  Hamstring Strength:  Lying on your back, push your heel against the floor with your leg straight by tightening up the muscles of your buttocks.  Repeat, but this time bend your knee to a comfortable angle, and push your heel against the floor.  You may put a pillow under the heel to make it more comfortable if necessary.   A rehabilitation program following joint replacement surgery can speed recovery and prevent re-injury in the future due to weakened  muscles. Contact your doctor or a physical therapist for more information on knee rehabilitation.    CONSTIPATION  Constipation is defined medically as fewer than three stools per week and severe constipation as less than one stool per week.  Even if you have a regular bowel pattern at home, your normal regimen is likely to be disrupted due to multiple reasons following surgery.  Combination of anesthesia, postoperative narcotics, change in appetite and fluid intake all can affect your bowels.   YOU MUST use at least  one of the following options; they are listed in order of increasing strength to get the job done.  They are all available over the counter, and you may need to use some, POSSIBLY even all of these options:    Drink plenty of fluids (prune juice may be helpful) and high fiber foods Colace 100 mg by mouth twice a day  Senokot for constipation as directed and as needed Dulcolax (bisacodyl), take with full glass of water  Miralax (polyethylene glycol) once or twice a day as needed.  If you have tried all these things and are unable to have a bowel movement in the first 3-4 days after surgery call either your surgeon or your primary doctor.    If you experience loose stools or diarrhea, hold the medications until you stool forms back up.  If your symptoms do not get better within 1 week or if they get worse, check with your doctor.  If you experience "the worst abdominal pain ever" or develop nausea or vomiting, please contact the office immediately for further recommendations for treatment.   ITCHING:  If you experience itching with your medications, try taking only a single pain pill, or even half a pain pill at a time.  You can also use Benadryl over the counter for itching or also to help with sleep.   TED HOSE STOCKINGS:  Use stockings on both legs until for at least 2 weeks or as directed by physician office. They may be removed at night for sleeping.  MEDICATIONS:  See your  medication summary on the "After Visit Summary" that nursing will review with you.  You may have some home medications which will be placed on hold until you complete the course of blood thinner medication.  It is important for you to complete the blood thinner medication as prescribed.  PRECAUTIONS:  If you experience chest pain or shortness of breath - call 911 immediately for transfer to the hospital emergency department.   If you develop a fever greater that 101 F, purulent drainage from wound, increased redness or drainage from wound, foul odor from the wound/dressing, or calf pain - CONTACT YOUR SURGEON.                                                   FOLLOW-UP APPOINTMENTS:  If you do not already have a post-op appointment, please call the office for an appointment to be seen by your surgeon.  Guidelines for how soon to be seen are listed in your "After Visit Summary", but are typically between 1-4 weeks after surgery.  OTHER INSTRUCTIONS:   Knee Replacement:  Do not place pillow under knee, focus on keeping the knee straight while resting. CPM instructions: 0-90 degrees, 2 hours in the morning, 2 hours in the afternoon, and 2 hours in the evening. Place foam block, curve side up under heel at all times except when in CPM or when walking.  DO NOT modify, tear, cut, or change the foam block in any way.  MAKE SURE YOU:  Understand these instructions.  Get help right away if you are not doing well or get worse.    Thank you for letting us be a part of your medical care team.  It is a privilege we respect greatly.  We hope these instructions will help you stay on track  for a fast and full recovery!     Do not put a pillow under the knee. Place it under the heel.    Complete by:  As directed   Place gray foam block, curve side up under heel at all times except when in CPM or when walking.  DO NOT modify, tear, cut, or change in any way the gray foam block.     Increase activity slowly as  tolerated    Complete by:  As directed      Patient may shower    Complete by:  As directed   Aquacel dressing is water proof    Wash over it and the whole leg with soap and water at the end of your shower     TED hose    Complete by:  As directed   Use stockings (TED hose) for 2 weeks on both leg(s).  You may remove them at night for sleeping.           Follow-up Information    Follow up with [facility].   Why:  They will contact you to schedule home therapy visits.    Contact information:   [address] [place] [address] [phone]       Follow up with Lorn Junes, MD On 01/03/2016.   Specialty:  Orthopedic Surgery   Why:  APPT TIME 3 PM   Contact information:   [address] [place] [address] [phone]        Signed: Linda Hedges 12/23/2015, 8:14 AM

## 2015-12-23 NOTE — Progress Notes (Signed)
Orthopedic Tech Progress Note Patient Details:  Richard Lynch August 17, 1951 [id number]  Patient ID: [name], male   DOB: 1951/07/21, 65 y.o.   MRN: [id number] Pt. On cpm 0-90  Karolee Stamps 12/23/2015, 5:44 AM

## 2015-12-28 ENCOUNTER — Other Ambulatory Visit: Payer: Self-pay | Admitting: Acute Care

## 2015-12-28 DIAGNOSIS — F17210 Nicotine dependence, cigarettes, uncomplicated: Principal | ICD-10-CM

## 2016-01-18 ENCOUNTER — Inpatient Hospital Stay: Admission: RE | Admit: 2016-01-18 | Payer: 59 | Source: Ambulatory Visit

## 2016-01-25 ENCOUNTER — Ambulatory Visit (INDEPENDENT_AMBULATORY_CARE_PROVIDER_SITE_OTHER)
Admission: RE | Admit: 2016-01-25 | Discharge: 2016-01-25 | Disposition: A | Discharge status: Home / Self Care | Payer: 59 | Source: Ambulatory Visit | Attending: Pulmonary Disease | Admitting: Pulmonary Disease

## 2016-01-25 DIAGNOSIS — R918 Other nonspecific abnormal finding of lung field: Secondary | ICD-10-CM | POA: Diagnosis not present

## 2016-01-28 ENCOUNTER — Telehealth: Payer: Self-pay | Admitting: Pulmonary Disease

## 2016-01-28 DIAGNOSIS — R918 Other nonspecific abnormal finding of lung field: Secondary | ICD-10-CM

## 2016-01-28 NOTE — Progress Notes (Signed)
Quick Note:  [email or web address] for pt ______ 

## 2016-01-28 NOTE — Progress Notes (Signed)
Quick Note:  Pt aware - see phone note from 01/28/16 for additional information. ______

## 2016-01-28 NOTE — Telephone Encounter (Signed)
Notes Recorded by Javier Glazier, MD on 01/27/2016 at 6:26 PM Please call the patient and let him know that I reviewed his CT scan. The nodules in his lungs have not changed in size. We should repeat his CT scan in September to continue to follow them as long as he is in agreement. Please order a CT scan of the chest without contrast in September 2017. Thank you.  --------  Spoke with pt.  Discussed above results and recs per Dr. Ashok Cordia.  Pt verbalized understanding and would like to proceed with CT in Sept.  Order placed.  Pt aware he will receive call closer to Sept regarding CT appt date and time.  He verbalized understanding, is in agreement with plan, and voiced no further questions or concerns at this time.  Pt to call office if anything needed prior.

## 2016-01-28 NOTE — Telephone Encounter (Signed)
[phone], pt cb

## 2016-05-08 ENCOUNTER — Other Ambulatory Visit: Payer: Self-pay | Admitting: Family Medicine

## 2016-05-08 DIAGNOSIS — R2689 Other abnormalities of gait and mobility: Secondary | ICD-10-CM

## 2016-05-16 ENCOUNTER — Ambulatory Visit
Admission: RE | Admit: 2016-05-16 | Discharge: 2016-05-16 | Disposition: A | Discharge status: Home / Self Care | Payer: 59 | Source: Ambulatory Visit | Attending: Family Medicine | Admitting: Family Medicine

## 2016-05-16 DIAGNOSIS — R2689 Other abnormalities of gait and mobility: Secondary | ICD-10-CM

## 2016-06-27 ENCOUNTER — Inpatient Hospital Stay: Admission: RE | Admit: 2016-06-27 | Payer: 59 | Source: Ambulatory Visit

## 2016-07-04 ENCOUNTER — Ambulatory Visit (INDEPENDENT_AMBULATORY_CARE_PROVIDER_SITE_OTHER)
Admission: RE | Admit: 2016-07-04 | Discharge: 2016-07-04 | Disposition: A | Discharge status: Home / Self Care | Payer: 59 | Source: Ambulatory Visit | Attending: Pulmonary Disease | Admitting: Pulmonary Disease

## 2016-07-04 DIAGNOSIS — R918 Other nonspecific abnormal finding of lung field: Secondary | ICD-10-CM

## 2016-07-05 ENCOUNTER — Ambulatory Visit (INDEPENDENT_AMBULATORY_CARE_PROVIDER_SITE_OTHER): Payer: 59 | Admitting: Pulmonary Disease

## 2016-07-05 ENCOUNTER — Encounter: Payer: Self-pay | Admitting: Pulmonary Disease

## 2016-07-05 VITALS — BP 118/62 | HR 71 | Ht 71.0 in | Wt 204.0 lb

## 2016-07-05 DIAGNOSIS — R918 Other nonspecific abnormal finding of lung field: Secondary | ICD-10-CM

## 2016-07-05 DIAGNOSIS — J439 Emphysema, unspecified: Secondary | ICD-10-CM

## 2016-07-05 DIAGNOSIS — Z9989 Dependence on other enabling machines and devices: Secondary | ICD-10-CM

## 2016-07-05 DIAGNOSIS — F17200 Nicotine dependence, unspecified, uncomplicated: Secondary | ICD-10-CM

## 2016-07-05 DIAGNOSIS — G2581 Restless legs syndrome: Secondary | ICD-10-CM | POA: Insufficient documentation

## 2016-07-05 DIAGNOSIS — G4733 Obstructive sleep apnea (adult) (pediatric): Secondary | ICD-10-CM

## 2016-07-05 LAB — PULMONARY FUNCTION TEST
DL/VA % pred: 67 %
DL/VA: 3.18 ml/min/mmHg/L
DLCO cor % pred: 68 %
DLCO cor: 23.04 ml/min/mmHg
DLCO unc % pred: 71 %
DLCO unc: 24.13 ml/min/mmHg
FEF 25-75 Pre: 2.24 L/sec
FEF2575-%Pred-Pre: 77 %
FEV1-%Pred-Pre: 95 %
FEV1-Pre: 3.47 L
FEV1FVC-%Pred-Pre: 96 %
FEV6-%Pred-Pre: 103 %
FEV6-Pre: 4.73 L
FEV6FVC-%Pred-Pre: 103 %
FVC-%Pred-Pre: 99 %
FVC-Pre: 4.81 L
Pre FEV1/FVC ratio: 72 %
Pre FEV6/FVC Ratio: 99 %

## 2016-07-05 NOTE — Progress Notes (Signed)
Test reviewed.  

## 2016-07-05 NOTE — Progress Notes (Addendum)
Subjective:    Patient ID: Richard Lynch, male    DOB: [birth year], 65 y.o.   MRN: [id number]  C.C.:  Follow-up for Bullous Emphysema, Bilateral Lung Nodules, OSA, & Tobacco Use Disorder.  HPI Bullous Emphysema:  Denies any new dyspnea. No coughing or wheezing. Still no evidence of COPD on spirometry today.  Bilateral Lung Nodules: Initially noted on low dose chest CT imaging September 2016. No change in CT scan this month.  OSA: Currently on CPAP therapy. Followed by outside physician. Reports he is compliant but his sleep quality is still poor. Now believed to have restless leg.  Tobacco Use Disorder:  He is back up to 1ppd. He has had multiple stressors that have prevented him from quitting. He has quit using patches for now.  Review of Systems Denies chest tightness, pain, or pressure. No fever, chills, or sweats. Does have significant back pain in his left hip radiating down to his foot.   No Known Allergies  Outpatient Encounter Prescriptions as of 07/05/2016  Medication Sig Note  . Ascorbic Acid (VITAMIN C) 100 MG tablet Take 100 mg by mouth daily.   Marland Kitchen atenolol (TENORMIN) 25 MG tablet Take 25 mg by mouth daily.  11/02/2015: Received from: External Pharmacy Received Sig:   . Calcium Carbonate (CALCIUM 600 PO) Take 1 tablet by mouth daily.   . candesartan (ATACAND) 32 MG tablet Take 32 mg by mouth daily.  11/02/2015: Received from: External Pharmacy Received Sig:   . celecoxib (CELEBREX) 200 MG capsule 1 tab po q day with food for pain and  swelling   . chlorthalidone (HYGROTON) 25 MG tablet Take 25 mg by mouth daily.  11/02/2015: Received from: External Pharmacy Received Sig:   . Cholecalciferol (VITAMIN D) 2000 units tablet Take 4,000 Units by mouth daily.   . COMBIVENT RESPIMAT 20-100 MCG/ACT AERS respimat Inhale 1 puff into the lungs every 6 (six) hours as needed for wheezing. As needed 12/07/2015: Rarely uses  . DEXILANT 60 MG capsule Take 60 mg by mouth daily. Once daily  11/02/2015: Received from: External Pharmacy Received Sig:   . fenofibrate 160 MG tablet Take 160 mg by mouth daily.  11/02/2015: Received from: External Pharmacy Received Sig:   . LIVALO 2 MG TABS Take 2 mg by mouth daily.  11/02/2015: Received from: External Pharmacy Received Sig:   . LORazepam (ATIVAN) 1 MG tablet 1 PO QHS AT NIGHT FOR SLEEP AND MUSCLE SPASM   . polyethylene glycol (MIRALAX / GLYCOLAX) packet 17grams in 4 oz of water twice a day until bowel movement.  LAXITIVE.  Restart if two days since last bowel movement   . valACYclovir (VALTREX) 500 MG tablet Take 500 mg by mouth daily.  11/02/2015: Received from: External Pharmacy Received Sig:   . [DISCONTINUED] apixaban (ELIQUIS) 2.5 MG TABS tablet Take 1 tablet (2.5 mg total) by mouth every 12 (twelve) hours. (Patient not taking: Reported on 07/05/2016)   . [DISCONTINUED] docusate sodium (COLACE) 100 MG capsule 1 tab 2 times a day while on narcotics.  STOOL SOFTENER (Patient not taking: Reported on 07/05/2016)   . [DISCONTINUED] HYDROmorphone (DILAUDID) 2 MG tablet 1-2 tablets every 3-4 hrs as needed for  Breakthrough pain.  SHORT ACTING PAIN MEDICATION.  DO NOT GIVE WITHIN 3 HRS OF LONG ACTING PAIN MEDICATION (Patient not taking: Reported on 07/05/2016)   . [DISCONTINUED] morphine (MS CONTIN) 15 MG 12 hr tablet 1 TABLET EVERY 12 HRS.  LONG ACTING PAIN MEDICATION.   DO NOT TAKE  AT THE SAME TIME AS THE SHORT ACTING PAIN MEDICATION (Patient not taking: Reported on 07/05/2016)   . [DISCONTINUED] naloxegol oxalate (MOVANTIK) 25 MG TABS tablet Take 1 tablet (25 mg total) by mouth daily. FOR OPIOID CONSTIPATION (Patient not taking: Reported on 07/05/2016)    No facility-administered encounter medications on file as of 07/05/2016.     Past Medical History:  Diagnosis Date  . Constipation due to opioid therapy 12/22/2015  . Emphysema lung ([place])   . GERD (gastroesophageal reflux disease)   . High cholesterol   . Hypertension   . OSA (obstructive sleep  apnea)    on CPAP  . Primary localized osteoarthritis of right knee 12/08/2015  . Septic olecranon bursitis 06/2015   MSSA    Past Surgical History:  Procedure Laterality Date  . COLONOSCOPY W/ BIOPSIES AND POLYPECTOMY    . ELBOW SURGERY  08/2015   bilateral  . EYE SURGERY     'repair of tear to left eye from pliers'  . HERNIA REPAIR  07/2010  . KNEE SURGERY     3 times on both knee's each  . MULTIPLE TOOTH EXTRACTIONS    . SHOULDER ARTHROSCOPY    . TOTAL HIP ARTHROPLASTY  08/2010  . TOTAL KNEE ARTHROPLASTY Right 12/20/2015  . TOTAL KNEE ARTHROPLASTY Right 12/20/2015   Procedure: TOTAL KNEE ARTHROPLASTY;  Surgeon: [name], MD;  Location: [facility];  Service: Orthopedics;  Laterality: Right;    Family History  Problem Relation Age of Onset  . Emphysema Father   . Heart disease Father   . Heart disease Mother   . Colon cancer Mother   . Breast cancer Mother     Social History   Social History  . Marital status: Divorced    Spouse name: N/A  . Number of children: Y  . Years of education: N/A   Occupational History  . owner of floor store    Social History Main Topics  . Smoking status: Current Some Day Smoker    Packs/day: 0.25    Types: Cigarettes    Start date: 01/24/1973  . Smokeless tobacco: Never Used     Comment: Peak rate of 3ppd  . Alcohol use 0.0 oz/week     Comment: 6 drinks per week  . Drug use: No  . Sexual activity: Not Asked   Other Topics Concern  . None   Social History Narrative   Originally from [place]. He has lived in [place] as well. He moved to [facility] in [place]. 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 He has bachelors degree in Business. Has a dog currently. No bird or mold exposure.       Objective:   Physical Exam BP 118/62 (BP Location: Left Arm, Cuff Size: Normal)   Pulse 71   Ht 5\' 11"  (1.803 m)   Wt 204 lb (92.5 kg)   SpO2 95%   BMI 28.45 kg/m  General:  Awake.  Alert. Comfortable. Integument:  Warm & dry. No rash on exposed skin.  Lymphatics:  No appreciated cervical or supraclavicular lymphadenoapthy. HEENT:  Moist mucus membranes. No oral ulcers. No scleral icterus. Cardiovascular:  Regular rate. No edema. Normal S1 & S2. Pulmonary:  Speaking in complete sentences. Normal work of breathing on room air. Clear to auscultation. Abdomen: Soft. Normal bowel sounds. Protuberant. Nontender.  PFT 07/05/16: FVC 4.81 L (99%) FEV1 3.47 L (95%) FEV1/FVC 0.72 FEF 25-72.24 L (77%)  DLCO corrected 68% (Hgb 16.4) 12/07/15: FVC 4.67 L (96%) FEV1 3.33 L (91%) FEV1/FVC 0.71 FEF 25-75 2.20 L (75%) no bronchodilator response TLC 6.99 L (96%) RV 99% ERV 52% DLCO uncorrected 66%  IMAGING CT CHEST W/O 07/04/16 (personally reviewed by me): No pathologic mediastinal adenopathy. Upper lobe predominant paraseptal emphysematous changes noted. Scattered bilateral subcentimeter nodules again noted with largest nodule measuring 7.5 mm left upper lobe and is unchanged. No new nodule is obvious were appreciated. No pleural effusion or thickening. No pericardial effusion.  LD CT CHEST W/O 07/01/15 (previously reviewed by me): Apical predominant emphysematous changes with subpleural blebs. 7 mm nodule with associated linear opacity consistent with scar in left lower lobe. Subcentimeter nodules noted otherwise. No pleural effusion or thickening. No pathologic mediastinal adenopathy. No pericardial effusion.  LABS 11/02/15 Alpha-1 Antitrypsin:  MM (193)    Assessment & Plan:  65 y.o. male with bullous emphysema as well as bilateral lung nodules noted on screening low-dose chest CT scan in September 2016. Symptomatically the patient has no problems from his underlying bullous emphysema. I did spend over 3 minutes counseling him on the need for complete tobacco cessation to  prevent worsening of his underlying emphysema. I reviewed his spirometry today which shows no evidence of airway obstruction. As such, I do not feel that inhaled medication therapy is necessary at this time. I also reviewed his chest CT scan which shows no change in his bilateral subcentimeter nodules and particularly his left upper lobe nodule. I instructed the patient to notify my office if he had any new breathing problems before his next appointment as I would be happy to see him sooner.  1. Bullous Emphysema: Repeat full pulmonary function testing and 6 minute walk test at next appointment. Holding on inhaler medication therapy at this time. 2. Bilateral Lung Nodules: Continuing yearly low-dose chest CT imaging for lung cancer screening. 3. Tobacco Use Disorder: Spent over 3 minutes again today counseling the patient on the need to complete the quit smoking. 4. OSA: Currently on CPAP therapy. Managed by outside physician. 5. Health Maintenance: S/P Pneumovax 14 November 2012 & Prevnar February 2017. Received Influenza Vaccine in August 2017. 6. Follow-up: Return to clinic in 1 year or sooner if needed.  Sonia Baller Ashok Cordia, M.D. [facility] Pager:  [phone] After 3pm or if no response, call [phone] 2:42 PM 07/05/16

## 2016-07-05 NOTE — Patient Instructions (Signed)
   Call me if you have any breathing problems because I would be happy to see you sooner if needed.  I will see you back in 1 year with a breathing test.  TESTS ORDERED: 1. Full PFTs at followup 2. 6MWT on RA at followup

## 2016-07-12 ENCOUNTER — Encounter (HOSPITAL_BASED_OUTPATIENT_CLINIC_OR_DEPARTMENT_OTHER): Payer: Self-pay | Admitting: *Deleted

## 2016-07-12 ENCOUNTER — Emergency Department (HOSPITAL_BASED_OUTPATIENT_CLINIC_OR_DEPARTMENT_OTHER)
Admission: EM | Admit: 2016-07-12 | Discharge: 2016-07-12 | Disposition: A | Discharge status: Home / Self Care | Payer: 59 | Attending: Emergency Medicine | Admitting: Emergency Medicine

## 2016-07-12 DIAGNOSIS — M5416 Radiculopathy, lumbar region: Secondary | ICD-10-CM | POA: Insufficient documentation

## 2016-07-12 DIAGNOSIS — Z79891 Long term (current) use of opiate analgesic: Secondary | ICD-10-CM | POA: Diagnosis not present

## 2016-07-12 DIAGNOSIS — R200 Anesthesia of skin: Secondary | ICD-10-CM | POA: Diagnosis not present

## 2016-07-12 DIAGNOSIS — I10 Essential (primary) hypertension: Secondary | ICD-10-CM | POA: Insufficient documentation

## 2016-07-12 DIAGNOSIS — Z79899 Other long term (current) drug therapy: Secondary | ICD-10-CM | POA: Diagnosis not present

## 2016-07-12 DIAGNOSIS — F17210 Nicotine dependence, cigarettes, uncomplicated: Secondary | ICD-10-CM | POA: Diagnosis not present

## 2016-07-12 DIAGNOSIS — M545 Low back pain: Secondary | ICD-10-CM | POA: Diagnosis present

## 2016-07-12 MED ORDER — KETOROLAC TROMETHAMINE 60 MG/2ML IM SOLN
60.0000 mg | Freq: Once | INTRAMUSCULAR | Status: AC
  Administered 2016-07-12: 60 mg via INTRAMUSCULAR
  Filled 2016-07-12: qty 2

## 2016-07-12 MED ORDER — METHOCARBAMOL 500 MG PO TABS: 500.0000 mg | ORAL_TABLET | Freq: Three times a day (TID) | ORAL | 0 refills | Status: DC | PRN

## 2016-07-12 MED ORDER — METHOCARBAMOL 500 MG PO TABS
500.0000 mg | ORAL_TABLET | Freq: Once | ORAL | Status: AC
  Administered 2016-07-12: 500 mg via ORAL
  Filled 2016-07-12: qty 1

## 2016-07-12 MED FILL — METHOCARBAMOL 500 MG TABLET: 500 | 10 days supply | Qty: 30 | Fill #0

## 2016-07-12 NOTE — ED Triage Notes (Signed)
C/o back pain since 6 months ago. Pt has known cyst between L4 and L5. Had steroid shot to area last Thursday. States pain is worse now.

## 2016-07-12 NOTE — ED Provider Notes (Signed)
[facility] DEPT [facility] Provider Note   CSN: [id number] Arrival date & time: 07/12/16  [id number]     History   Chief Complaint Chief Complaint  Patient presents with  . Back Pain    HPI Richard Lynch is a 65 y.o. male.  HPI Patient with history of lumbar radiculopathy presents with ongoing pain in the low back radiating to the left foot. States he saw his orthopedist on Thursday and had an MRI performed. Showed cystic lesion that L4/L5 level. Received a steroid injection that time. States he's had ongoing pain despite injection. Fever or chills. No new weakness. Ongoing left foot numbness. Denies bladder or bowel incontinence. He states he is taking Percocet and Neurontin at home with little relief of symptoms. Past Medical History:  Diagnosis Date  . Constipation due to opioid therapy 12/22/2015  . Emphysema lung ([place])   . GERD (gastroesophageal reflux disease)   . High cholesterol   . Hypertension   . OSA (obstructive sleep apnea)    on CPAP  . Primary localized osteoarthritis of right knee 12/08/2015  . Septic olecranon bursitis 06/2015   MSSA    Patient Active Problem List   Diagnosis Date Noted  . Restless leg syndrome 07/05/2016  . Constipation due to opioid therapy 12/22/2015  . Primary localized osteoarthritis of right knee 12/08/2015  . Bullous emphysema ([place]) 11/02/2015  . Essential hypertension 11/02/2015  . Arthritis 11/02/2015  . Hyperlipidemia 11/02/2015  . OSA on CPAP 11/02/2015  . GERD (gastroesophageal reflux disease) 11/02/2015  . Septic olecranon bursitis 06/24/2015  . Multiple lung nodules on CT 06/30/2014    Past Surgical History:  Procedure Laterality Date  . COLONOSCOPY W/ BIOPSIES AND POLYPECTOMY    . ELBOW SURGERY  08/2015   bilateral  . EYE SURGERY     'repair of tear to left eye from pliers'  . HERNIA REPAIR  07/2010  . KNEE SURGERY     3 times on both knee's each  . MULTIPLE TOOTH EXTRACTIONS    . SHOULDER ARTHROSCOPY    . TOTAL HIP  ARTHROPLASTY  08/2010  . TOTAL KNEE ARTHROPLASTY Right 12/20/2015  . TOTAL KNEE ARTHROPLASTY Right 12/20/2015   Procedure: TOTAL KNEE ARTHROPLASTY;  Surgeon: [name], MD;  Location: [facility];  Service: Orthopedics;  Laterality: Right;       Home Medications    Prior to Admission medications   Medication Sig Start Date End Date Taking? Authorizing Provider  amphetamine-dextroamphetamine (ADDERALL) 20 MG tablet Take 25 mg by mouth daily.   Yes Historical Provider, MD  Ascorbic Acid (VITAMIN C) 100 MG tablet Take 100 mg by mouth daily.   Yes Historical Provider, MD  atenolol (TENORMIN) 25 MG tablet Take 25 mg by mouth daily.  10/16/15  Yes Historical Provider, MD  Calcium Carbonate (CALCIUM 600 PO) Take 1 tablet by mouth daily.   Yes Historical Provider, MD  candesartan (ATACAND) 32 MG tablet Take 32 mg by mouth daily.  10/16/15  Yes Historical Provider, MD  celecoxib (CELEBREX) 200 MG capsule 1 tab po q day with food for pain and  swelling 12/23/15  Yes Kirstin Shepperson, PA-C  chlorthalidone (HYGROTON) 25 MG tablet Take 25 mg by mouth daily.  10/30/15  Yes Historical Provider, MD  Cholecalciferol (VITAMIN D) 2000 units tablet Take 4,000 Units by mouth daily.   Yes Historical Provider, MD  COMBIVENT RESPIMAT 20-100 MCG/ACT AERS respimat Inhale 1 puff into the lungs every 6 (six) hours as needed for wheezing. As needed  09/02/15  Yes Historical Provider, MD  DEXILANT 60 MG capsule Take 60 mg by mouth daily. Once daily 11/01/15  Yes Historical Provider, MD  fenofibrate 160 MG tablet Take 160 mg by mouth daily.  10/16/15  Yes Historical Provider, MD  LIVALO 2 MG TABS Take 2 mg by mouth daily.  09/17/15  Yes Historical Provider, MD  oxyCODONE-acetaminophen (PERCOCET) 10-325 MG tablet Take 1 tablet by mouth every 4 (four) hours as needed for pain.   Yes Historical Provider, MD  valACYclovir (VALTREX) 500 MG tablet Take 500 mg by mouth daily.  10/16/15  Yes Historical Provider, MD  methocarbamol  (ROBAXIN) 500 MG tablet Take 1 tablet (500 mg total) by mouth every 8 (eight) hours as needed for muscle spasms. 07/12/16   Julianne Rice, MD    Family History Family History  Problem Relation Age of Onset  . Emphysema Father   . Heart disease Father   . Heart disease Mother   . Colon cancer Mother   . Breast cancer Mother     Social History Social History  Substance Use Topics  . Smoking status: Current Some Day Smoker    Packs/day: 2.00    Types: Cigarettes    Start date: 01/24/1973  . Smokeless tobacco: Never Used     Comment: Peak rate of 3ppd  . Alcohol use 0.0 oz/week     Comment: 6 drinks per week     Allergies   Review of patient's allergies indicates no known allergies.   Review of Systems Review of Systems  Constitutional: Negative for chills and fever.  Genitourinary: Negative for difficulty urinating.  Musculoskeletal: Positive for back pain. Negative for joint swelling and neck pain.  Neurological: Positive for numbness. Negative for weakness.  All other systems reviewed and are negative.    Physical Exam Updated Vital Signs BP 109/70 (BP Location: Left Arm)   Pulse 85   Temp 98.6 F (37 C) (Oral)   Resp 16   Ht 5\' 11"  (1.803 m)   Wt 202 lb (91.6 kg)   SpO2 95%   BMI 28.17 kg/m   Physical Exam  Constitutional: He is oriented to person, place, and time. He appears well-developed and well-nourished.  HENT:  Head: Normocephalic and atraumatic.  Mouth/Throat: Oropharynx is clear and moist.  Eyes: EOM are normal. Pupils are equal, round, and reactive to light.  Neck: Normal range of motion. Neck supple.  Cardiovascular: Normal rate and regular rhythm.   Pulmonary/Chest: Effort normal and breath sounds normal.  Abdominal: Soft. Bowel sounds are normal. There is no tenderness. There is no rebound and no guarding.  Musculoskeletal: Normal range of motion. He exhibits no edema or tenderness.  Mild midline lumbar tenderness to palpation and left  paraspinal lumbar tenderness. Positive straight leg raise. No lower extremity swelling, asymmetry or tenderness to palpation. 2+ dorsalis pedis and posterior tibial pulses.  Neurological: He is alert and oriented to person, place, and time.  Patient is alert and oriented x3 with clear, goal oriented speech. Patient has 5/5 motor in all extremities. Decreased sensation to bilateral feet. Patient has a normal gait and walks without assistance.  Skin: Skin is warm and dry. No rash noted. No erythema.  Psychiatric: He has a normal mood and affect. His behavior is normal.  Nursing note and vitals reviewed.    ED Treatments / Results  Labs (all labs ordered are listed, but only abnormal results are displayed) Labs Reviewed - No data to display  EKG  EKG  Interpretation None       Radiology No results found.  Procedures Procedures (including critical care time)  Medications Ordered in ED Medications  ketorolac (TORADOL) injection 60 mg (60 mg Intramuscular Given 07/12/16 0932)  methocarbamol (ROBAXIN) tablet 500 mg (500 mg Oral Given 07/12/16 0931)     Initial Impression / Assessment and Plan / ED Course  I have reviewed the triage vital signs and the nursing notes.  Pertinent labs & imaging results that were available during my care of the patient were reviewed by me and considered in my medical decision making (see chart for details).  Clinical Course    No red flag signs or symptoms. Has follow-up with orthopedist. Return precautions given.  Final Clinical Impressions(s) / ED Diagnoses   Final diagnoses:  Lumbar radiculopathy    New Prescriptions Current Discharge Medication List    START taking these medications   Details  methocarbamol (ROBAXIN) 500 MG tablet Take 1 tablet (500 mg total) by mouth every 8 (eight) hours as needed for muscle spasms. Qty: 30 tablet, Refills: 0         Julianne Rice, MD 07/12/16 [phone]

## 2017-02-20 ENCOUNTER — Ambulatory Visit (INDEPENDENT_AMBULATORY_CARE_PROVIDER_SITE_OTHER): Payer: 59 | Admitting: Neurology

## 2017-02-20 ENCOUNTER — Encounter (INDEPENDENT_AMBULATORY_CARE_PROVIDER_SITE_OTHER): Payer: Self-pay

## 2017-02-20 ENCOUNTER — Encounter: Payer: Self-pay | Admitting: Neurology

## 2017-02-20 VITALS — BP 141/80 | HR 82 | Ht 71.0 in | Wt 205.4 lb

## 2017-02-20 DIAGNOSIS — E538 Deficiency of other specified B group vitamins: Secondary | ICD-10-CM | POA: Diagnosis not present

## 2017-02-20 DIAGNOSIS — R5382 Chronic fatigue, unspecified: Secondary | ICD-10-CM

## 2017-02-20 DIAGNOSIS — R269 Unspecified abnormalities of gait and mobility: Secondary | ICD-10-CM | POA: Diagnosis not present

## 2017-02-20 HISTORY — DX: Unspecified abnormalities of gait and mobility: R26.9

## 2017-02-20 NOTE — Progress Notes (Signed)
Reason for visit: Gait disturbance  Referring physician: Dr. Hepler  Richard Lynch is a 66 y.o. male  History of present illness:  Richard Lynch is a 66 year old left-handed white male with a history of a sensation of gait instability that dates back one year. He has had no significant progression in the gait instability issue, but it has not improved either. He recalls starting on Celebrex around the time of his symptoms, but coming off the medication has not helped. He was also on gabapentin, cessation of this medication has not improved his balance. The patient has not had any falls, he may occasionally have a "stutter step" when he walks. He has noted that if he is particularly active during the day, the balance seems to be worse. He reports a concurrent problem with significant fatigue and lack of energy, at times he feels as if his legs are heavy. The patient also has occasional episodes of brief dizziness that may last only a second or 2, this is unassociated with the episodes of feeling imbalanced. The patient does report he has some numbness of the feet bilaterally and he has numbness in the hands that wake him up at night. He has a history of a cyst at L4-5 level in the back, his back pain over time has improved, he denies any sciatica type pain down either leg. He does report some mild neck discomfort but he denies any discomfort down the arms. The patient reports no difficulty controlling the bowels or the bladder. The has undergone MRI of the brain in July 2017 that was unremarkable. He is sent to this office for further evaluation.  Past Medical History:  Diagnosis Date  . Constipation due to opioid therapy 12/22/2015  . Emphysema lung ([place])   . GERD (gastroesophageal reflux disease)   . High cholesterol   . Hypertension   . OSA (obstructive sleep apnea)    on CPAP  . Primary localized osteoarthritis of right knee 12/08/2015  . Septic olecranon bursitis 06/2015   MSSA    Past  Surgical History:  Procedure Laterality Date  . COLONOSCOPY W/ BIOPSIES AND POLYPECTOMY    . ELBOW SURGERY  08/2015   bilateral  . EYE SURGERY     'repair of tear to left eye from pliers'  . HERNIA REPAIR  07/2010  . KNEE SURGERY     3 times on both knee's each  . MULTIPLE TOOTH EXTRACTIONS    . SHOULDER ARTHROSCOPY    . TOTAL HIP ARTHROPLASTY  08/2010  . TOTAL KNEE ARTHROPLASTY Right 12/20/2015  . TOTAL KNEE ARTHROPLASTY Right 12/20/2015   Procedure: TOTAL KNEE ARTHROPLASTY;  Surgeon: [name], MD;  Location: [facility];  Service: Orthopedics;  Laterality: Right;    Family History  Problem Relation Age of Onset  . Emphysema Father   . Heart disease Father   . Heart disease Mother   . Colon cancer Mother   . Breast cancer Mother     Social history:  reports that he has been smoking Cigarettes.  He started smoking about 44 years ago. He has been smoking about 2.00 packs per day. He has never used smokeless tobacco. He reports that he drinks alcohol. He reports that he does not use drugs.  Medications:  Prior to Admission medications   Medication Sig Start Date End Date Taking? Authorizing Provider  atenolol (TENORMIN) 25 MG tablet Take 25 mg by mouth daily.  10/16/15  Yes Historical Provider, MD  candesartan (ATACAND)  32 MG tablet Take 32 mg by mouth daily.  10/16/15  Yes Historical Provider, MD  celecoxib (CELEBREX) 200 MG capsule 1 tab po q day with food for pain and  swelling 12/23/15  Yes Kirstin Shepperson, PA-C  chlorthalidone (HYGROTON) 25 MG tablet Take 25 mg by mouth daily.  10/30/15  Yes Historical Provider, MD  DEXILANT 60 MG capsule Take 60 mg by mouth daily. Once daily 11/01/15  Yes Historical Provider, MD  valACYclovir (VALTREX) 500 MG tablet Take 500 mg by mouth daily.  10/16/15  Yes Historical Provider, MD  amphetamine-dextroamphetamine (ADDERALL) 20 MG tablet Take 25 mg by mouth daily.    Historical Provider, MD  Ascorbic Acid (VITAMIN C) 100 MG tablet Take 100 mg by  mouth daily.    Historical Provider, MD  Calcium Carbonate (CALCIUM 600 PO) Take 1 tablet by mouth daily.    Historical Provider, MD  Cholecalciferol (VITAMIN D) 2000 units tablet Take 4,000 Units by mouth daily.    Historical Provider, MD  COMBIVENT RESPIMAT 20-100 MCG/ACT AERS respimat Inhale 1 puff into the lungs every 6 (six) hours as needed for wheezing. As needed 09/02/15   Historical Provider, MD  fenofibrate 160 MG tablet Take 160 mg by mouth daily.  10/16/15   Historical Provider, MD  LIVALO 2 MG TABS Take 2 mg by mouth daily.  09/17/15   Historical Provider, MD  methocarbamol (ROBAXIN) 500 MG tablet Take 1 tablet (500 mg total) by mouth every 8 (eight) hours as needed for muscle spasms. 07/12/16   Julianne Rice, MD  oxyCODONE-acetaminophen (PERCOCET) 10-325 MG tablet Take 1 tablet by mouth every 4 (four) hours as needed for pain.    Historical Provider, MD     No Known Allergies  ROS:  Out of a complete 14 system review of symptoms, the patient complains only of the following symptoms, and all other reviewed systems are negative.  Fatigue Blurred vision Easy bruising, easy bleeding  Blood pressure (!) 141/80, pulse 82, height 5\' 11"  (1.803 m), weight 205 lb 6.4 oz (93.2 kg).  Physical Exam  General: The patient is alert and cooperative at the time of the examination.  Eyes: Pupils are equal, round, and reactive to light. Discs are flat bilaterally.  Neck: The neck is supple, no carotid bruits are noted.  Respiratory: The respiratory examination is clear.  Cardiovascular: The cardiovascular examination reveals a regular rate and rhythm, no obvious murmurs or rubs are noted.  Skin: Extremities are with trace edema at the ankles bilaterally.  Neurologic Exam  Mental status: The patient is alert and oriented x 3 at the time of the examination. The patient has apparent normal recent and remote memory, with an apparently normal attention span and concentration  ability.  Cranial nerves: Facial symmetry is present. There is good sensation of the face to pinprick and soft touch bilaterally. The strength of the facial muscles and the muscles to head turning and shoulder shrug are normal bilaterally. Speech is well enunciated, no aphasia or dysarthria is noted. Extraocular movements are full. Visual fields are full. The tongue is midline, and the patient has symmetric elevation of the soft palate. No obvious hearing deficits are noted.  Motor: The motor testing reveals 5 over 5 strength of all 4 extremities. Good symmetric motor tone is noted throughout.  Sensory: Sensory testing is intact to pinprick, soft touch, vibration sensation, and position sense on all 4 extremities, with exception that there is some decrease in vibration and position sense in the  right foot. No evidence of extinction is noted.  Coordination: Cerebellar testing reveals good finger-nose-finger and heel-to-shin bilaterally. Tinel's sign at the wrists are positive bilaterally.  Gait and station: Gait is normal. Tandem gait is minimally unsteady. Romberg is negative. No drift is seen.  Reflexes: Deep tendon reflexes are symmetric and normal bilaterally. Toes are downgoing bilaterally.   MRI brain 05/16/16:  IMPRESSION: No acute abnormality in the brain and no change from the prior MRI Mild mucosal edema in the paranasal sinuses with improvement from the prior study.  * MRI scan images were reviewed online. I agree with the written report.    Assessment/Plan:  1. Mild gait instability  2. Brief episodic dizziness  The etiology of the above symptomatology is not clear. The patient has already had MRI evaluation of the brain has not shown any etiology of his symptoms. The patient will be sent for blood work today, he will have nerve conduction studies on both legs and one arm, also look at both median nerves as the patient likely has bilateral carpal tunnel syndrome. The patient  will have EMG evaluation on the right leg. He will follow-up for the above study. The patient could benefit from gait training through physical therapy in the future. He drinks 1 or 2 out of beverages at night, he indicates that the does not have a long-standing history of daily alcohol use.  Jill Alexanders MD 02/20/2017 10:25 AM  [facility] [address] [place], [place] [address]  Phone [phone] Fax [phone]

## 2017-02-20 NOTE — Patient Instructions (Signed)
   We will get blood work today and get EMG and NCV evaluation.

## 2017-02-21 ENCOUNTER — Telehealth: Payer: Self-pay | Admitting: *Deleted

## 2017-02-21 LAB — TSH: TSH: 0.893 u[IU]/mL (ref 0.450–4.500)

## 2017-02-21 LAB — RPR: RPR Ser Ql: NONREACTIVE

## 2017-02-21 LAB — SEDIMENTATION RATE: Sed Rate: 7 mm/hr (ref 0–30)

## 2017-02-21 LAB — VITAMIN B12: Vitamin B-12: 801 pg/mL (ref 232–1245)

## 2017-02-21 NOTE — Telephone Encounter (Signed)
-----   Message from Kathrynn Ducking, MD sent at 02/21/2017  7:58 AM EDT -----   The blood work results are unremarkable. Please call the patient.  ----- Message ----- From: Lavone Neri Lab Results In Sent: 02/21/2017   5:41 AM To: Kathrynn Ducking, MD

## 2017-02-21 NOTE — Telephone Encounter (Signed)
Called and spoke with patient about unremarkable labs per CW,MD. Reminded him of appt made for EMG/NCS on 5/15. He verbalized understanding.

## 2017-03-06 ENCOUNTER — Ambulatory Visit (INDEPENDENT_AMBULATORY_CARE_PROVIDER_SITE_OTHER): Payer: 59 | Admitting: Neurology

## 2017-03-06 ENCOUNTER — Ambulatory Visit (INDEPENDENT_AMBULATORY_CARE_PROVIDER_SITE_OTHER): Payer: Self-pay | Admitting: Neurology

## 2017-03-06 ENCOUNTER — Encounter: Payer: Self-pay | Admitting: Neurology

## 2017-03-06 DIAGNOSIS — R269 Unspecified abnormalities of gait and mobility: Secondary | ICD-10-CM

## 2017-03-06 DIAGNOSIS — G609 Hereditary and idiopathic neuropathy, unspecified: Secondary | ICD-10-CM

## 2017-03-06 DIAGNOSIS — H8110 Benign paroxysmal vertigo, unspecified ear: Secondary | ICD-10-CM

## 2017-03-06 NOTE — Progress Notes (Signed)
The patient comes in for EMG nerve conduction study procedure today. The study shows right carpal tunnel syndrome that is mild, a borderline left carpal tunnel syndrome and evidence of a mild to moderate primarily axonal peripheral neuropathy.  The patient is reporting some dizziness sensations that occur with rolling in bed, and was sitting up, worse in the morning. The patient may have an inner ear issue, he will be referred to vestibular rehabilitation.

## 2017-03-06 NOTE — Progress Notes (Signed)
Please refer to EMG and nerve conduction study procedure note. 

## 2017-03-06 NOTE — Procedures (Signed)
     HISTORY:  Richard Lynch is a 66 year old gentleman with a history of numbness of the hands and evidence of mild gait instability. The patient reports numbness of both feet and toes. He is being evaluated for possible neuropathy or a radiculopathy.  NERVE CONDUCTION STUDIES:  Nerve conduction studies were performed on both upper extremities. The distal motor latencies for the median nerves bilaterally were prolonged, more so on the right than the left. Motor amplitudes for these nerves were normal bilaterally. There was very slight slowing seen for the median nerves bilaterally. The study of the left ulnar nerve reveals a normal distal motor latency with a slightly low motor amplitude, slowing was seen above and below the elbow for this nerve. The distal motor latency for the right ulnar nerve was normal with a normal motor amplitude. Slight slowing was seen above and below the elbow. The sensory latency for the right radial nerve was normal, the sensory latencies for the median nerves were prolonged bilaterally and normal for the ulnar nerves bilaterally. The F wave latencies for the median and ulnar nerves bilaterally were prolonged.  Nerve conduction studies were performed on the lower extremities. The study of the left peroneal nerve was unobtainable. The right peroneal nerve reveals a normal distal motor latency with a low motor amplitude and slowing seen above and below the fibular head. The distal motor latencies for the posterior tibial nerves were normal, there was a low motor amplitude on the left posterior tibial nerve, normal on the right. Slowing was seen for the posterior tibial nerves bilaterally. The sensory latencies for the sural nerves were prolonged on the left, normal on the right, and prolonged for the left peroneal nerve, normal on the right. The F wave latencies for the right posterior tibial nerve was prolonged. The H reflex latencies were prolonged bilaterally.  EMG  STUDIES:  EMG study was performed on the right lower extremity:  The tibialis anterior muscle reveals 2 to 5K motor units with decreased recruitment. No fibrillations or positive waves were seen. The peroneus tertius muscle reveals 2 to 4K motor units with slightly decreased recruitment. No fibrillations or positive waves were seen. The medial gastrocnemius muscle reveals up to 6K motor units with decreased recruitment. No fibrillations or positive waves were seen. The vastus lateralis muscle reveals 2 to 4K motor units with full recruitment. No fibrillations or positive waves were seen. The iliopsoas muscle reveals 2 to 4K motor units with full recruitment. No fibrillations or positive waves were seen. The biceps femoris muscle (long head) reveals 2 to 4K motor units with full recruitment. No fibrillations or positive waves were seen. The lumbosacral paraspinal muscles were tested at 3 levels, and revealed no abnormalities of insertional activity at all 3 levels tested. There was good relaxation.   IMPRESSION:  Nerve conduction studies done on both upper and lower extremities shows evidence of a generalized primarily axonal peripheral neuropathy of mild to moderate severity. There appears to be evidence of mild bilateral carpal tunnel syndrome, more prominent on the right than the left. EMG evaluation of the right lower extremity shows mild chronic stable distal denervation consistent with the diagnosis of peripheral neuropathy. There is no clear evidence of an overlying lumbosacral radiculopathy.  Jill Alexanders MD 03/06/2017 4:56 PM  [facility] [address] [place], [place] [address]  Phone [phone] Fax [phone]

## 2017-03-10 LAB — MULTIPLE MYELOMA PANEL, SERUM
Albumin SerPl Elph-Mcnc: 3.9 g/dL (ref 2.9–4.4)
Albumin/Glob SerPl: 1.4 (ref 0.7–1.7)
Alpha 1: 0.3 g/dL (ref 0.0–0.4)
Alpha2 Glob SerPl Elph-Mcnc: 0.8 g/dL (ref 0.4–1.0)
B-Globulin SerPl Elph-Mcnc: 1.1 g/dL (ref 0.7–1.3)
Gamma Glob SerPl Elph-Mcnc: 0.8 g/dL (ref 0.4–1.8)
Globulin, Total: 3 g/dL (ref 2.2–3.9)
IgA/Immunoglobulin A, Serum: 198 mg/dL (ref 61–437)
IgG (Immunoglobin G), Serum: 691 mg/dL — ABNORMAL LOW (ref 700–1600)
IgM (Immunoglobulin M), Srm: 168 mg/dL (ref 20–172)
Total Protein: 6.9 g/dL (ref 6.0–8.5)

## 2017-03-10 LAB — ANGIOTENSIN CONVERTING ENZYME: Angio Convert Enzyme: 29 U/L (ref 14–82)

## 2017-03-10 LAB — B. BURGDORFI ANTIBODIES: Lyme IgG/IgM Ab: <0.91 {ISR} (ref 0.00–0.90)

## 2017-03-10 LAB — ANA W/REFLEX: Anti Nuclear Antibody(ANA): NEGATIVE

## 2017-03-10 LAB — RHEUMATOID FACTOR: Rhuematoid fact SerPl-aCnc: <10 IU/mL (ref 0.0–13.9)

## 2017-03-12 ENCOUNTER — Telehealth: Payer: Self-pay | Admitting: *Deleted

## 2017-03-12 NOTE — Telephone Encounter (Signed)
Spoke with patient and informed him that his blood work results are unremarkable per Dr Jannifer Franklin. Patient questioned "What is next?" Reviewed Dr Tobey Grim plan per office note. Patient has completed EMG/NCS and knows results. This RN offered to make follow up, next available in Sept or to request physical therapy for him. Patient stated he is going to talk to his orthopedist first. He will then call back. This RN advised Terrence Dupont, Dr Jannifer Franklin 's RN will be glad to assit him at that time. He verbalized understanding, appreciation.

## 2017-05-16 ENCOUNTER — Ambulatory Visit (INDEPENDENT_AMBULATORY_CARE_PROVIDER_SITE_OTHER): Payer: 59 | Admitting: Pulmonary Disease

## 2017-05-16 ENCOUNTER — Encounter: Payer: Self-pay | Admitting: Pulmonary Disease

## 2017-05-16 VITALS — BP 132/76 | HR 77 | Ht 71.0 in | Wt 196.8 lb

## 2017-05-16 DIAGNOSIS — R918 Other nonspecific abnormal finding of lung field: Secondary | ICD-10-CM | POA: Diagnosis not present

## 2017-05-16 DIAGNOSIS — F17200 Nicotine dependence, unspecified, uncomplicated: Secondary | ICD-10-CM

## 2017-05-16 DIAGNOSIS — J439 Emphysema, unspecified: Secondary | ICD-10-CM

## 2017-05-16 NOTE — Progress Notes (Signed)
Subjective:    Patient ID: Richard Lynch, male    DOB: November 14, 1950, 66 y.o.   MRN: [id number]  C.C.:  Follow-up for Bullous Emphysema, Bilateral Lung Nodules, OSA, & Tobacco Use Disorder.  HPI Bullous emphysema: Not currently on inhaler therapy. He denies any dyspnea, coughing, or wheezing.   Bilateral lung nodules: Seen initially on low dose chest CT imaging September 2016. No evidence of evolution. Patient planned for continued yearly imaging with low-dose CT scan. No chest pain or pressure.   OSA: Prescribed CPAP. Followed by outside physician. He is still having some restless leg syndrome. He reports he is still waking up at night on CPAP therapy.   Tobacco use disorder: At last appointment patient was continuing to smoke 1 pack per day. Previously had quit using nicotine patches. Previously identified multiple psychosocial stressors preventing him from quitting. He is still smoking 1ppd. He tried Chantix but reports it didn't seem to help.   Review of Systems He reports he has had some fatigue and lack of clarity of mind. He reports low energy. He reports he hasn't been sleeping poorly. He does feel like his balance is poor. He does have some numbness in his hands and toes bilaterally. He has been following with Neurology for early neuropathy. No fever or chills. He has lost some weight and had some night sweats.   No Known Allergies  Outpatient Encounter Prescriptions as of 05/16/2017  Medication Sig Note  . amphetamine-dextroamphetamine (ADDERALL) 20 MG tablet Take 25 mg by mouth daily.   . Ascorbic Acid (VITAMIN C) 100 MG tablet Take 100 mg by mouth daily.   Marland Kitchen aspirin 81 MG tablet Take 81 mg by mouth daily.   Marland Kitchen atenolol (TENORMIN) 25 MG tablet Take 25 mg by mouth daily.  11/02/2015: Received from: External Pharmacy Received Sig:   . Calcium Carbonate (CALCIUM 600 PO) Take 1 tablet by mouth daily.   . candesartan (ATACAND) 32 MG tablet Take 32 mg by mouth daily.  11/02/2015: Received  from: External Pharmacy Received Sig:   . celecoxib (CELEBREX) 200 MG capsule 1 tab po q day with food for pain and  swelling   . chlorthalidone (HYGROTON) 25 MG tablet Take 25 mg by mouth daily.  11/02/2015: Received from: External Pharmacy Received Sig:   . Cholecalciferol (VITAMIN D) 2000 units tablet Take 4,000 Units by mouth daily.   Marland Kitchen DEXILANT 60 MG capsule Take 60 mg by mouth daily. Once daily 11/02/2015: Received from: External Pharmacy Received Sig:   . fenofibrate 160 MG tablet Take 160 mg by mouth daily.  11/02/2015: Received from: External Pharmacy Received Sig:   . IRON PO Take 65 mg by mouth daily.   Marland Kitchen LIVALO 2 MG TABS Take 2 mg by mouth daily.  11/02/2015: Received from: External Pharmacy Received Sig:   . valACYclovir (VALTREX) 500 MG tablet Take 500 mg by mouth daily.  11/02/2015: Received from: External Pharmacy Received Sig:   . vitamin B-12 (CYANOCOBALAMIN) 1000 MCG tablet Take 1,000 mcg by mouth daily.   . vitamin E 400 UNIT capsule Take 400 Units by mouth daily.    No facility-administered encounter medications on file as of 05/16/2017.     Past Medical History:  Diagnosis Date  . Constipation due to opioid therapy 12/22/2015  . Emphysema lung ([place])   . Gait abnormality 02/20/2017  . GERD (gastroesophageal reflux disease)   . High cholesterol   . Hypertension   . OSA (obstructive sleep apnea)  on CPAP  . Primary localized osteoarthritis of right knee 12/08/2015  . Septic olecranon bursitis 06/2015   MSSA    Past Surgical History:  Procedure Laterality Date  . COLONOSCOPY W/ BIOPSIES AND POLYPECTOMY    . ELBOW SURGERY  08/2015   bilateral  . EYE SURGERY     'repair of tear to left eye from pliers'  . HERNIA REPAIR  07/2010  . KNEE SURGERY     3 times on both knee's each  . MULTIPLE TOOTH EXTRACTIONS    . SHOULDER ARTHROSCOPY    . TOTAL HIP ARTHROPLASTY  08/2010  . TOTAL KNEE ARTHROPLASTY Right 12/20/2015  . TOTAL KNEE ARTHROPLASTY Right 12/20/2015   Procedure:  TOTAL KNEE ARTHROPLASTY;  Surgeon: [name], MD;  Location: [facility];  Service: Orthopedics;  Laterality: Right;    Family History  Problem Relation Age of Onset  . Emphysema Father   . Heart disease Father   . Heart disease Mother   . Colon cancer Mother   . Breast cancer Mother     Social History   Social History  . Marital status: Divorced    Spouse name: N/A  . Number of children: 3  . Years of education: College   Occupational History  . Owner of floor store    Social History Main Topics  . Smoking status: Current Some Day Smoker    Packs/day: 1.00    Types: Cigarettes    Start date: 01/24/1973  . Smokeless tobacco: Never Used     Comment: Peak rate of 3ppd  . Alcohol use 0.0 oz/week     Comment: 1-2 drinks per night  . Drug use: No  . Sexual activity: Not Asked   Other Topics Concern  . None   Social History Narrative   Originally from [place]. He has lived in [place] as well. He moved to [facility] in [place].    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    He has bachelors degree in Business.    Has a dog currently. No bird or mold exposure.    Caffeine FYB:OFBPZW- daily   Soda- sometimes   Left-handed         Objective:   Physical Exam BP 132/76 (BP Location: Left Arm, Cuff Size: Normal)   Pulse 77   Ht 5\' 11"  (1.803 m)   Wt 196 lb 12.8 oz (89.3 kg)   SpO2 97%   BMI 27.45 kg/m   General:  Awake. Alert. No distress. Caucasian male. Integument:  Warm & dry. No rash on exposed skin. No bruising on exposed skin. Extremities:  No cyanosis or clubbing.  HEENT:  Moist mucus membranes. No oral ulcers. No nasal turbinate swelling. Cardiovascular:  Regular rate. No edema. Normal S1 & S2.  Pulmonary:  Clear with auscultation bilaterally. Normal work of breathing on room air. Abdomen: Soft. Normal bowel sounds. Nondistended.  Musculoskeletal:  Normal bulk and tone. No joint deformity or effusion  appreciated.  PFT 07/05/16: FVC 4.81 L (99%) FEV1 3.47 L (95%) FEV1/FVC 0.72 FEF 25-72.24 L (77%)  DLCO corrected 68% (Hgb 16.4) 12/07/15: FVC 4.67 L (96%) FEV1 3.33 L (91%) FEV1/FVC 0.71 FEF 25-75 2.20 L (75%) no bronchodilator response TLC 6.99 L (96%) RV 99% ERV 52% DLCO uncorrected 66%  IMAGING CT CHEST W/O 07/04/16 (reviewed with the patient again today): No pathologic mediastinal adenopathy. Upper lobe predominant paraseptal emphysematous changes noted. Scattered bilateral subcentimeter nodules again noted with largest nodule measuring 7.5 mm left upper lobe and is unchanged. No new nodule is obvious were appreciated. No pleural effusion or thickening. No pericardial effusion.  LD CT CHEST W/O 07/01/15 (previously reviewed by me): Apical predominant emphysematous changes with subpleural blebs. 7 mm nodule with associated linear opacity consistent with scar in left lower lobe. Subcentimeter nodules noted otherwise. No pleural effusion or thickening. No pathologic mediastinal adenopathy. No pericardial effusion.  LABS 11/02/15 Alpha-1 Antitrypsin:  MM (193)    Assessment & Plan:  66 y.o. male with bullous emphysema, bilateral lung nodules, OSA, and tobacco use disorder. I reviewed the patient's chest CT scan which shows subcentimeter nodules with recommendation for repeat CT imaging at 18-24 months. Given the patient's ongoing tobacco use he will continue his yearly low-dose chest CT imaging for lung cancer screening in September. We discussed his ongoing fatigue and despite his bullous emphysema I do not feel this is contributing. He does have multiple psychosocial stressors admits that depression may be a factor in his feeling of unwell. He is continuing to use his CPAP therapy. We did discuss his ongoing tobacco use at length as well as lack of response to Chantix. I again  recommended trying nicotine replacement. I instructed the patient to contact my office if he had any new breathing problems or questions before his next appointment.  1. Bullous emphysema: Checking full pulmonary function testing at follow-up appointment. 2. Bilateral lung nodules: Continuing to monitor with yearly low-dose chest CT imaging for lung cancer screening. 3. OSA: Continuing on CPAP therapy as managed by outside physician. 4. Tobacco use disorder: Patient counseled for over 3 minutes the need for complete tobacco cessation. Recommended nicotine replacement. 5. Health maintenance: Status post Pneumovax 14 November 2012 & Prevnar February 2017.  6. Follow-up: Return to clinic in 3 months or sooner if needed.  Sonia Baller Ashok Cordia, M.D. [facility] Pager:  [phone] After 3pm or if no response, call [phone] 3:37 PM 05/16/17

## 2017-05-16 NOTE — Patient Instructions (Signed)
   Call me if you have any new breathing problems or questions before your next appointment.  We will make sure you are set up for a CT scan of your chest in September to continue your lung cancer screening and continue to monitor the spot in your left upper lung.  We will do a breathing test when I see you back in a couple of months as well.  Try using the nicotine patches along with nicotine gum or lozenges for intermittent cravings.   TESTS ORDERED: 1. Full PFTs on or before next appointment 2. LD Chest CT w/o in September (Sent message to Eric Form)

## 2017-06-07 ENCOUNTER — Other Ambulatory Visit: Payer: Self-pay | Admitting: Family Medicine

## 2017-06-07 DIAGNOSIS — I739 Peripheral vascular disease, unspecified: Secondary | ICD-10-CM

## 2017-06-07 DIAGNOSIS — M5412 Radiculopathy, cervical region: Secondary | ICD-10-CM

## 2017-06-13 ENCOUNTER — Ambulatory Visit
Admission: RE | Admit: 2017-06-13 | Discharge: 2017-06-13 | Disposition: A | Discharge status: Home / Self Care | Payer: 59 | Source: Ambulatory Visit | Attending: Family Medicine | Admitting: Family Medicine

## 2017-06-13 DIAGNOSIS — I739 Peripheral vascular disease, unspecified: Secondary | ICD-10-CM

## 2017-06-14 ENCOUNTER — Ambulatory Visit
Admission: RE | Admit: 2017-06-14 | Discharge: 2017-06-14 | Disposition: A | Discharge status: Home / Self Care | Payer: 59 | Source: Ambulatory Visit | Attending: Family Medicine | Admitting: Family Medicine

## 2017-06-14 DIAGNOSIS — M5412 Radiculopathy, cervical region: Secondary | ICD-10-CM

## 2017-06-18 ENCOUNTER — Other Ambulatory Visit: Payer: 59

## 2017-07-04 ENCOUNTER — Telehealth: Payer: Self-pay | Admitting: Acute Care

## 2017-07-04 DIAGNOSIS — R911 Solitary pulmonary nodule: Secondary | ICD-10-CM

## 2017-07-04 NOTE — Telephone Encounter (Signed)
Received word from [facility] w/ CT Previous order was 'linked' by another office and needs to be ordered again This has been done Will sign off

## 2017-07-09 ENCOUNTER — Ambulatory Visit (INDEPENDENT_AMBULATORY_CARE_PROVIDER_SITE_OTHER)
Admission: RE | Admit: 2017-07-09 | Discharge: 2017-07-09 | Disposition: A | Discharge status: Home / Self Care | Payer: 59 | Source: Ambulatory Visit | Attending: Pulmonary Disease | Admitting: Pulmonary Disease

## 2017-07-09 ENCOUNTER — Encounter (INDEPENDENT_AMBULATORY_CARE_PROVIDER_SITE_OTHER): Payer: Self-pay

## 2017-07-09 ENCOUNTER — Inpatient Hospital Stay: Admission: RE | Admit: 2017-07-09 | Payer: 59 | Source: Ambulatory Visit

## 2017-07-09 DIAGNOSIS — Z87891 Personal history of nicotine dependence: Secondary | ICD-10-CM

## 2017-07-09 DIAGNOSIS — R911 Solitary pulmonary nodule: Secondary | ICD-10-CM

## 2017-07-24 ENCOUNTER — Other Ambulatory Visit: Payer: Self-pay | Admitting: Acute Care

## 2017-07-24 DIAGNOSIS — Z122 Encounter for screening for malignant neoplasm of respiratory organs: Secondary | ICD-10-CM

## 2017-07-24 DIAGNOSIS — F17210 Nicotine dependence, cigarettes, uncomplicated: Principal | ICD-10-CM

## 2017-07-27 ENCOUNTER — Other Ambulatory Visit: Payer: Self-pay | Admitting: Neurological Surgery

## 2017-07-27 DIAGNOSIS — M4722 Other spondylosis with radiculopathy, cervical region: Secondary | ICD-10-CM

## 2017-07-30 ENCOUNTER — Ambulatory Visit
Admission: RE | Admit: 2017-07-30 | Discharge: 2017-07-30 | Disposition: A | Discharge status: Home / Self Care | Payer: 59 | Source: Ambulatory Visit | Attending: Neurological Surgery | Admitting: Neurological Surgery

## 2017-07-30 DIAGNOSIS — M4722 Other spondylosis with radiculopathy, cervical region: Secondary | ICD-10-CM

## 2017-08-16 ENCOUNTER — Ambulatory Visit: Payer: 59 | Admitting: Pulmonary Disease

## 2017-08-16 ENCOUNTER — Ambulatory Visit (INDEPENDENT_AMBULATORY_CARE_PROVIDER_SITE_OTHER): Payer: 59 | Admitting: Pulmonary Disease

## 2017-08-16 DIAGNOSIS — J439 Emphysema, unspecified: Secondary | ICD-10-CM | POA: Diagnosis not present

## 2017-08-16 LAB — PULMONARY FUNCTION TEST
DL/VA % pred: 69 %
DL/VA: 3.23 ml/min/mmHg/L
DLCO cor % pred: 70 %
DLCO cor: 23.65 ml/min/mmHg
DLCO unc % pred: 71 %
DLCO unc: 24.04 ml/min/mmHg
FEF 25-75 Post: 1.99 L/sec
FEF 25-75 Pre: 2.35 L/sec
FEF2575-%Change-Post: -15 %
FEF2575-%Pred-Post: 70 %
FEF2575-%Pred-Pre: 82 %
FEV1-%Change-Post: -3 %
FEV1-%Pred-Post: 94 %
FEV1-%Pred-Pre: 98 %
FEV1-Post: 3.39 L
FEV1-Pre: 3.52 L
FEV1FVC-%Change-Post: 1 %
FEV1FVC-%Pred-Pre: 95 %
FEV6-%Change-Post: -5 %
FEV6-%Pred-Post: 101 %
FEV6-%Pred-Pre: 106 %
FEV6-Post: 4.6 L
FEV6-Pre: 4.86 L
FEV6FVC-%Change-Post: 0 %
FEV6FVC-%Pred-Post: 103 %
FEV6FVC-%Pred-Pre: 103 %
FVC-%Change-Post: -4 %
FVC-%Pred-Post: 97 %
FVC-%Pred-Pre: 102 %
FVC-Post: 4.69 L
FVC-Pre: 4.93 L
Post FEV1/FVC ratio: 72 %
Post FEV6/FVC ratio: 98 %
Pre FEV1/FVC ratio: 72 %
Pre FEV6/FVC Ratio: 99 %
RV % pred: 104 %
RV: 2.49 L
TLC % pred: 103 %
TLC: 7.48 L

## 2017-08-16 NOTE — Patient Instructions (Signed)
PFT done today. 

## 2017-08-21 ENCOUNTER — Ambulatory Visit: Payer: Self-pay | Admitting: Pulmonary Disease

## 2017-09-20 ENCOUNTER — Encounter: Payer: Self-pay | Admitting: Surgery

## 2017-09-20 ENCOUNTER — Other Ambulatory Visit: Payer: Self-pay

## 2017-09-20 ENCOUNTER — Institutional Professional Consult (permissible substitution): Payer: 59 | Admitting: Surgery

## 2017-09-20 VITALS — BP 140/78 | HR 72 | Ht 71.0 in | Wt 210.0 lb

## 2017-09-20 DIAGNOSIS — M4723 Other spondylosis with radiculopathy, cervicothoracic region: Secondary | ICD-10-CM | POA: Diagnosis not present

## 2017-09-30 ENCOUNTER — Encounter: Payer: Self-pay | Admitting: Surgery

## 2017-09-30 NOTE — Progress Notes (Signed)
Cardiothoracic Surgery Consultation  PCP is Vernie Shanks, MD Referring Provider is Ditty, Kevan Ny, *  Chief Complaint  Patient presents with  . New Patient (Initial Visit)    eval exposure for anterior fusion, CT Spine 10/9, Ct chest 9/17    HPI:  The patient is a 66 year old gentleman with a history of hypertension, hypercholesterolemia, emphysema, OSA on CPAP, osteoarthritis s/p right TKR and left THR, and severe degenerative spine disease who has been seen by Dr. Cyndy Freeze for severe weakness in his grip wit strength in his hand intrinsics bilaterally. A CT of the spine shows severe disease in the lower cervical and upper thoracic spine and Dr. Cyndy Freeze has recommended decompression at C6-7, C7-T1, and T1-T2. He feels that anterior exposure via a sternotomy would be needed and has referred the patient to me for evaluation.   Past Medical History:  Diagnosis Date  . Constipation due to opioid therapy 12/22/2015  . Emphysema lung ([place])   . Gait abnormality 02/20/2017  . GERD (gastroesophageal reflux disease)   . High cholesterol   . Hypertension   . OSA (obstructive sleep apnea)    on CPAP  . Primary localized osteoarthritis of right knee 12/08/2015  . Septic olecranon bursitis 06/2015   MSSA    Past Surgical History:  Procedure Laterality Date  . COLONOSCOPY W/ BIOPSIES AND POLYPECTOMY    . ELBOW SURGERY  08/2015   bilateral  . EYE SURGERY     'repair of tear to left eye from pliers'  . HERNIA REPAIR  07/2010  . KNEE SURGERY     3 times on both knee's each  . MULTIPLE TOOTH EXTRACTIONS    . SHOULDER ARTHROSCOPY    . TOTAL HIP ARTHROPLASTY  08/2010  . TOTAL KNEE ARTHROPLASTY Right 12/20/2015  . TOTAL KNEE ARTHROPLASTY Right 12/20/2015   Procedure: TOTAL KNEE ARTHROPLASTY;  Surgeon: [name], MD;  Location: [facility];  Service: Orthopedics;  Laterality: Right;    Family History  Problem Relation Age of Onset  . Emphysema Father   . Heart disease Father   . Heart  disease Mother   . Colon cancer Mother   . Breast cancer Mother     Social History Social History   Tobacco Use  . Smoking status: Current Some Day Smoker    Packs/day: 1.00    Types: Cigarettes    Start date: 01/24/1973  . Smokeless tobacco: Never Used  . Tobacco comment: Peak rate of 3ppd  Substance Use Topics  . Alcohol use: Yes    Alcohol/week: 0.0 oz    Comment: 1-2 drinks per night  . Drug use: No    Current Outpatient Medications  Medication Sig Dispense Refill  . amphetamine-dextroamphetamine (ADDERALL) 20 MG tablet Take 25 mg by mouth daily.    . Ascorbic Acid (VITAMIN C) 100 MG tablet Take 100 mg by mouth daily.    Marland Kitchen aspirin 81 MG tablet Take 81 mg by mouth daily.    Marland Kitchen atenolol (TENORMIN) 25 MG tablet Take 25 mg by mouth daily.     Marland Kitchen buPROPion (ZYBAN) 150 MG 12 hr tablet Take 150 mg by mouth 2 (two) times daily.    . Calcium Carbonate (CALCIUM 600 PO) Take 1 tablet by mouth daily.    . candesartan (ATACAND) 32 MG tablet Take 32 mg by mouth daily.     . celecoxib (CELEBREX) 200 MG capsule 1 tab po q day with food for pain and  swelling 30  capsule 0  . chlorthalidone (HYGROTON) 25 MG tablet Take 25 mg by mouth daily.     . Cholecalciferol (VITAMIN D3 PO) Take 2,000 Units by mouth 2 (two) times daily.    . Coenzyme Q10 (COQ-10 PO) Take 300 mg by mouth daily.    . fenofibrate 160 MG tablet Take 160 mg by mouth daily.     . IRON PO Take 65 mg by mouth daily.    Marland Kitchen LIVALO 2 MG TABS Take 2 mg by mouth daily.     . Omega-3 Fatty Acids (FISH OIL PO) Take 2,400 mg by mouth daily.    . valACYclovir (VALTREX) 500 MG tablet Take 500 mg by mouth daily.     . vitamin B-12 (CYANOCOBALAMIN) 1000 MCG tablet Take 1,000 mcg by mouth daily.    . vitamin E 400 UNIT capsule Take 400 Units by mouth daily.     No current facility-administered medications for this visit.     No Known Allergies  Review of Systems  Constitutional: Positive for fatigue.  HENT: Negative.   Eyes:        Floaters  Respiratory: Positive for apnea.        CPAP at night  Cardiovascular: Negative for chest pain.  Gastrointestinal: Negative.   Endocrine: Negative.   Genitourinary: Negative.   Neurological: Positive for weakness and numbness.  Hematological: Bruises/bleeds easily.  Psychiatric/Behavioral: Negative.     BP 140/78   Pulse 72   Ht 5\' 11"  (1.803 m)   Wt 210 lb (95.3 kg)   SpO2 97% Comment: on RA  BMI 29.29 kg/m  Physical Exam  Constitutional: He is oriented to person, place, and time. He appears well-developed and well-nourished. No distress.  HENT:  Head: Normocephalic and atraumatic.  Mouth/Throat: Oropharynx is clear and moist.  Eyes: Conjunctivae and EOM are normal. Pupils are equal, round, and reactive to light.  Neck: No JVD present. No thyromegaly present.  Cardiovascular: Normal rate, regular rhythm and normal heart sounds.  No murmur heard. Pulmonary/Chest: Effort normal and breath sounds normal. No respiratory distress.  Abdominal: Soft. Bowel sounds are normal. He exhibits no distension. There is no tenderness.  Musculoskeletal: He exhibits no edema.  Lymphadenopathy:    He has no cervical adenopathy.  Neurological: He is alert and oriented to person, place, and time.  Bilateral weak grip  Skin: Skin is warm and dry.  Psychiatric: He has a normal mood and affect.     Diagnostic Tests:  CLINICAL DATA:  Current smoker, 62 pack-year history, lung cancer screening.  EXAM: CT CHEST WITHOUT CONTRAST LOW-DOSE FOR LUNG CANCER SCREENING  TECHNIQUE: Multidetector CT imaging of the chest was performed following the standard protocol without IV contrast.  COMPARISON:  07/04/2016.  FINDINGS: Cardiovascular: Atherosclerotic calcification of the arterial vasculature, including coronary arteries. Heart size normal. No pericardial effusion.  Mediastinum/Nodes: Mediastinal lymph nodes are not enlarged by CT size criteria. Hilar regions are difficult to  definitively evaluate without IV contrast but appear grossly unremarkable. No axillary adenopathy. Esophagus is grossly unremarkable.  Lungs/Pleura: Centrilobular and paraseptal emphysema. Scattered pulmonary parenchymal scarring. Scattered pulmonary nodules measure 7.5 mm or less in size, as before. No new pulmonary nodules. No pleural fluid. Airway is unremarkable.  Upper Abdomen: Visualized portions of the liver, gallbladder, adrenal glands, kidneys, spleen, pancreas, stomach and bowel are grossly unremarkable. No upper abdominal adenopathy.  Musculoskeletal: Degenerative changes in the spine. No worrisome lytic or sclerotic lesions.  IMPRESSION: 1. Lung-RADS 2, benign appearance or behavior. Continue  annual screening with low-dose chest CT without contrast in 12 months. 2. Aortic atherosclerosis (ICD10-170.0). Coronary artery calcification. 3.  Emphysema (ICD10-J43.9).   Electronically Signed   By: Lorin Picket M.D.   On: 07/09/2017 11:51   CLINICAL DATA:  66 year old male with progressed chronic cervical spine pain. Bilateral hand and arm weakness. Six months of left hand and left third finger weakness.  EXAM: CT CERVICAL SPINE WITHOUT CONTRAST  TECHNIQUE: Multidetector CT imaging of the cervical spine was performed without intravenous contrast. Multiplanar CT image reconstructions were also generated.  COMPARISON:  Cervical spine radiographs 07/11/2017. Cervical spine MRI 06/14/2017.  FINDINGS: Alignment: Straightening of cervical lordosis with multilevel spondylolisthesis. Subtle anterolisthesis of C3 on C4. Anterolisthesis of up to 5 mm of C7 on T1. Mild retrolisthesis of C6 on C7. There is also 2-3 mm of anterolisthesis of T1 on T2. These are stable since August.  Skull base and vertebrae: Severe degenerative appearing sclerosis mixed with subchondral endplate lucency with gas phenomena at the C7 and T1 vertebral bodies. Confluent marrow  edema here on the August comparison. Similar advanced but not as severe C6 inferior endplate degeneration. Degenerative sclerosis associated with widespread cervical facet arthropathy, detailed below. Visible skull is intact. No acute osseous abnormality identified.  Soft tissues and spinal canal: Negative visualized noncontrast brain parenchyma. Negative noncontrast neck soft tissues.  Visualized paranasal sinuses and mastoids are stable and well pneumatized.  Disc levels:  C2-C3: Severe left and mild right facet hypertrophy. Circumferential disc bulge and endplate spurring. Thecal sac patency appears stable since August. Severe left C3 foraminal stenosis.  C3-C4: Mild anterolisthesis. Moderate to severe bilateral facet hypertrophy, greater on the left with subchondral cysts and vacuum phenomena. Broad-based disc and endplate spurring. Thecal sac patency appears stable since August with spinal stenosis. Severe bilateral C4 foraminal stenosis.  C4-C5: Severe left and mild-to-moderate right facet hypertrophy. Broad-based disc bulging and endplate spurring. Stable thecal sac patency. Moderate to severe left and borderline to mild right C5 foraminal stenosis.  C5-C6: Disc space loss with circumferential disc osteophyte complex eccentric to the right. Mild to moderate facet hypertrophy. Thecal sac patency appears stable. Mild to moderate left and severe right C5 foraminal stenosis.  C6-C7: Severe disc space loss with vacuum phenomena. Bulky circumferential disc osteophyte complex, especially far lateral. Moderate to severe facet hypertrophy. Thecal sac patency appears stable with spinal stenosis. Moderate to severe left greater than right C7 foraminal stenosis.  C7-T1: Complete disc space loss and up to 5 mm of anterolisthesis. Severe facet hypertrophy with bilateral facet sclerosis. Possible developing facet ankylosis (on the left sagittal image 58). Bulky broad-based  posterior disc osteophyte complex. Suspected stable thecal sac patency with spinal stenosis. Moderate to severe bilateral C8 foraminal stenosis.  T1-T2: Anterolisthesis with moderate to severe disc space loss and bilateral facet hypertrophy. Possible developing left side facet ankylosis also at this level (sagittal image 55). Stable thecal sac patency with spinal stenosis. Moderate severe right greater than left T1 foraminal stenosis.  T2-T3: Subtle anterolisthesis with moderate to severe facet hypertrophy. Probable mild spinal stenosis. Moderate to severe T2 foraminal stenosis greater on the left.  Upper chest: Paraseptal emphysema. Negative visible noncontrast superior mediastinum.  IMPRESSION: 1. Widespread severe cervical facet arthropathy with multilevel spondylolisthesis, worst at C7-T1. 2. Complete loss of the disc space at C7-T1 with vertebral body sclerosis corresponding to marrow edema on the August MRI. Severe facet degeneration at that level, possibly with developing left side facet ankylosis here and also at  T1-T2. 3. Advanced disc and endplate degeneration D1-S9 through C7-T1. 4. Multilevel cervical and upper thoracic spinal stenosis appears stable since August, along with widespread moderate and severe cervical foraminal stenosis. 5. Apical pulmonary emphysema.   Electronically Signed   By: Genevie Ann M.D.   On: 07/31/2017 08:17  Impression:  He has severe degenerative spine disease with progressive numbness and weakness in the upper extremities and requires exposure of C6-T2 for surgery. This area is accessible via a partial upper sternotomy extended up into the neck although no one in our practice has done that before because this is not a common area for spine surgery. I discussed that with the patient but told him that I would be willing to provide that exposure as well as I could but it is a difficult area due to the trachea being anterior to the spine and  requiring mobilization as well as the major vascular structures, recurrent laryngeal nerve, and thoracic duct that run through this area. He said that he is still thinking about whether he wants to proceed with surgery or get another opinion. I discussed the approach with him including the risks of bleeding, blood transfusion, infection, and injury to the major structures as noted above running through this area.   Plan:  He will decide if he wants to proceed with surgery and will schedule that with Dr. Cyndy Freeze. I will be available to provide the exposure.  I spent 45 minutes performing this consultation and > 50% of this time was spent face to face counseling and coordinating the care of this patient's spinal exposure.  Gaye Pollack, MD [facility] [phone]

## 2017-11-02 ENCOUNTER — Other Ambulatory Visit: Payer: Self-pay | Admitting: Orthopedic Surgery

## 2017-11-02 DIAGNOSIS — M79601 Pain in right arm: Secondary | ICD-10-CM

## 2017-11-02 DIAGNOSIS — M79602 Pain in left arm: Secondary | ICD-10-CM

## 2017-11-06 ENCOUNTER — Other Ambulatory Visit: Payer: Self-pay | Admitting: Orthopedic Surgery

## 2017-11-06 DIAGNOSIS — M79602 Pain in left arm: Secondary | ICD-10-CM

## 2017-11-06 DIAGNOSIS — R202 Paresthesia of skin: Secondary | ICD-10-CM

## 2017-11-06 DIAGNOSIS — M79601 Pain in right arm: Secondary | ICD-10-CM

## 2017-11-06 DIAGNOSIS — M545 Low back pain, unspecified: Secondary | ICD-10-CM

## 2017-11-06 DIAGNOSIS — M79661 Pain in right lower leg: Secondary | ICD-10-CM

## 2017-11-06 DIAGNOSIS — M542 Cervicalgia: Secondary | ICD-10-CM

## 2017-11-06 DIAGNOSIS — M79662 Pain in left lower leg: Secondary | ICD-10-CM

## 2017-11-06 DIAGNOSIS — M4802 Spinal stenosis, cervical region: Secondary | ICD-10-CM

## 2017-11-13 ENCOUNTER — Ambulatory Visit
Admission: RE | Admit: 2017-11-13 | Discharge: 2017-11-13 | Disposition: A | Discharge status: Home / Self Care | Payer: 59 | Source: Ambulatory Visit | Attending: Orthopedic Surgery | Admitting: Orthopedic Surgery

## 2017-11-13 DIAGNOSIS — M79601 Pain in right arm: Secondary | ICD-10-CM

## 2017-11-13 DIAGNOSIS — M545 Low back pain, unspecified: Secondary | ICD-10-CM

## 2017-11-13 DIAGNOSIS — M79661 Pain in right lower leg: Secondary | ICD-10-CM

## 2017-11-13 DIAGNOSIS — R202 Paresthesia of skin: Secondary | ICD-10-CM

## 2017-11-13 DIAGNOSIS — M4802 Spinal stenosis, cervical region: Secondary | ICD-10-CM

## 2017-11-13 DIAGNOSIS — M79602 Pain in left arm: Secondary | ICD-10-CM

## 2017-11-13 DIAGNOSIS — M542 Cervicalgia: Secondary | ICD-10-CM

## 2017-11-13 DIAGNOSIS — M79662 Pain in left lower leg: Secondary | ICD-10-CM

## 2017-11-27 DIAGNOSIS — G5602 Carpal tunnel syndrome, left upper limb: Secondary | ICD-10-CM | POA: Insufficient documentation

## 2017-11-27 DIAGNOSIS — G5632 Lesion of radial nerve, left upper limb: Secondary | ICD-10-CM | POA: Insufficient documentation

## 2017-11-27 DIAGNOSIS — M65342 Trigger finger, left ring finger: Secondary | ICD-10-CM | POA: Insufficient documentation

## 2017-12-04 ENCOUNTER — Other Ambulatory Visit: Payer: Self-pay | Admitting: Orthopedic Surgery

## 2017-12-04 DIAGNOSIS — M899 Disorder of bone, unspecified: Secondary | ICD-10-CM

## 2017-12-04 DIAGNOSIS — Z13820 Encounter for screening for osteoporosis: Secondary | ICD-10-CM

## 2017-12-06 ENCOUNTER — Other Ambulatory Visit: Payer: Self-pay | Admitting: Orthopedic Surgery

## 2017-12-06 DIAGNOSIS — M899 Disorder of bone, unspecified: Secondary | ICD-10-CM

## 2017-12-10 ENCOUNTER — Ambulatory Visit
Admission: RE | Admit: 2017-12-10 | Discharge: 2017-12-10 | Disposition: A | Discharge status: Home / Self Care | Payer: 59 | Source: Ambulatory Visit | Attending: Orthopedic Surgery | Admitting: Orthopedic Surgery

## 2017-12-10 DIAGNOSIS — M899 Disorder of bone, unspecified: Secondary | ICD-10-CM

## 2017-12-17 DIAGNOSIS — M4802 Spinal stenosis, cervical region: Secondary | ICD-10-CM | POA: Insufficient documentation

## 2018-01-28 ENCOUNTER — Other Ambulatory Visit: Payer: Self-pay | Admitting: Family Medicine

## 2018-01-28 ENCOUNTER — Ambulatory Visit
Admission: RE | Admit: 2018-01-28 | Discharge: 2018-01-28 | Disposition: A | Discharge status: Home / Self Care | Payer: Medicare Other | Source: Ambulatory Visit | Attending: Family Medicine | Admitting: Family Medicine

## 2018-01-28 DIAGNOSIS — Z87891 Personal history of nicotine dependence: Secondary | ICD-10-CM

## 2018-01-28 DIAGNOSIS — R0989 Other specified symptoms and signs involving the circulatory and respiratory systems: Secondary | ICD-10-CM | POA: Diagnosis not present

## 2018-01-28 DIAGNOSIS — R05 Cough: Secondary | ICD-10-CM | POA: Diagnosis not present

## 2018-01-28 DIAGNOSIS — L0291 Cutaneous abscess, unspecified: Secondary | ICD-10-CM

## 2018-01-28 DIAGNOSIS — R509 Fever, unspecified: Secondary | ICD-10-CM | POA: Diagnosis not present

## 2018-01-28 DIAGNOSIS — H66001 Acute suppurative otitis media without spontaneous rupture of ear drum, right ear: Secondary | ICD-10-CM | POA: Diagnosis not present

## 2018-01-28 DIAGNOSIS — G4710 Hypersomnia, unspecified: Secondary | ICD-10-CM | POA: Diagnosis not present

## 2018-01-31 DIAGNOSIS — M4323 Fusion of spine, cervicothoracic region: Secondary | ICD-10-CM | POA: Diagnosis not present

## 2018-01-31 DIAGNOSIS — R201 Hypoesthesia of skin: Secondary | ICD-10-CM | POA: Diagnosis not present

## 2018-01-31 DIAGNOSIS — Z4789 Encounter for other orthopedic aftercare: Secondary | ICD-10-CM | POA: Diagnosis not present

## 2018-02-12 ENCOUNTER — Encounter: Payer: Self-pay | Admitting: Occupational Therapy

## 2018-02-12 ENCOUNTER — Ambulatory Visit: Payer: Medicare Other | Attending: Family Medicine | Admitting: Occupational Therapy

## 2018-02-12 DIAGNOSIS — R2681 Unsteadiness on feet: Secondary | ICD-10-CM | POA: Diagnosis not present

## 2018-02-12 DIAGNOSIS — M6281 Muscle weakness (generalized): Secondary | ICD-10-CM

## 2018-02-12 DIAGNOSIS — M25642 Stiffness of left hand, not elsewhere classified: Secondary | ICD-10-CM

## 2018-02-12 DIAGNOSIS — R208 Other disturbances of skin sensation: Secondary | ICD-10-CM | POA: Diagnosis not present

## 2018-02-12 NOTE — Therapy (Signed)
[facility] [address] [place], [name], [address] Phone: [phone]   Fax:  [phone]  Occupational Therapy Evaluation  Patient Details  Name: Richard Lynch MRN: [id number] Date of Birth: 1958/02/22 Referring Provider: Dr. Marykay Lex   Encounter Date: 02/12/2018  OT End of Session - 02/12/18 1256    Visit Number  1    Number of Visits  24    Date for OT Re-Evaluation  05/13/18    Authorization Type  Medicare    Authorization Time Period  90 days    OT Start Time  1140    OT Stop Time  1230    OT Time Calculation (min)  50 min    Activity Tolerance  Patient tolerated treatment well    Behavior During Therapy  [facility] for tasks assessed/performed       Past Medical History:  Diagnosis Date  . Constipation due to opioid therapy 12/22/2015  . Emphysema lung ([place])   . Gait abnormality 02/20/2017  . GERD (gastroesophageal reflux disease)   . High cholesterol   . Hypertension   . OSA (obstructive sleep apnea)    on CPAP  . Primary localized osteoarthritis of right knee 12/08/2015  . Septic olecranon bursitis 06/2015   MSSA    Past Surgical History:  Procedure Laterality Date  . COLONOSCOPY W/ BIOPSIES AND POLYPECTOMY    . ELBOW SURGERY  08/2015   bilateral  . EYE SURGERY     'repair of tear to left eye from pliers'  . HERNIA REPAIR  07/2010  . KNEE SURGERY     3 times on both knee's each  . MULTIPLE TOOTH EXTRACTIONS    . SHOULDER ARTHROSCOPY    . TOTAL HIP ARTHROPLASTY  08/2010  . TOTAL KNEE ARTHROPLASTY Right 12/20/2015  . TOTAL KNEE ARTHROPLASTY Right 12/20/2015   Procedure: TOTAL KNEE ARTHROPLASTY;  Surgeon: [name], MD;  Location: [facility];  Service: Orthopedics;  Laterality: Right;    There were no vitals filed for this visit.  Subjective Assessment - 02/12/18 1151    Subjective   Pt with hx of trigger finger, left radial n. palsy, s/p C5-T2 fusion, foraminotomies at C5,C6, 7, T11, and laminectomies  C5, C6, C7, and T1 on 12/18/17    Pertinent History  Pt with hx of trigger finger, left radial n. palsy, s/p C5-T2 fusion, foraminotomies at C5,C6, 7, T11, and laminectomies C5, C6, C7, and T1 on 12/18/17    Limitations  no bending, arching, and twisting, cervical collar at all times except for night time, showering and pt wears soift collar at night    Patient Stated Goals  to be able to play golf, to be able to write, and feed himself with LUE    Currently in Pain?  No/denies        [facility] OT Assessment - 02/12/18 1200      Assessment   Medical Diagnosis  trigger finger, radial n. palsy, s/p C5-T1 fusion, and laminectomies C5, C6, C7, T1     Referring Provider  Dr. Marykay Lex    Onset Date/Surgical Date  12/18/17    Hand Dominance  Left      Precautions   Precautions  Cervical;Back    Precaution Comments  no bending arching twisting, cervical collar    Required Braces or Orthoses  Cervical Brace      Restrictions   Other Position/Activity Restrictions  back prec      Balance Screen   Has the  patient fallen in the past 6 months  No    Has the patient had a decrease in activity level because of a fear of falling?   No    Is the patient reluctant to leave their home because of a fear of falling?   No      Home  Environment   Family/patient expects to be discharged to:  Private residence    Lives With  Significant other      Prior Function   Level of [place]  Retired    Office manager, boating      ADL   Eating/Feeding  Needs assist with cutting food uses primarily [place],     [place] independent    [place] independent stand in shower    Upper Body Dressing  Needs assist for fasteners;Independent    Lower Body Dressing  Modified independent;Needs assist for fasteners    Toilet Transfer  Modified independent    Tub/Shower Transfer  Supervision/safety      IADL   Shopping  Needs to  be accompanied on any shopping trip    Light Housekeeping  Needs help with all home maintenance tasks    Meal Prep  Able to complete simple cold meal and snack prep    Financial Management  Requires assistance      Mobility   Mobility Status  -- modified independent but pt reports balance issues      Written Expression   Dominant Hand  Left    Handwriting  Not legible      Observation/Other Assessments   Other Surveys   Select    Outcome Measures  PPT#2 feeding 22.72 secs      Sensation   Light Touch  Impaired by gross assessment all fingertips right hand, ulnar side of LUE and fingertips      Coordination   Gross Motor Movements are Fluid and Coordinated  No    Fine Motor Movements are Fluid and Coordinated  No    9 Hole Peg Test  Right;Left    Right 9 Hole Peg Test  31.41 secs    Left 9 Hole Peg Test  2 mins 3 secs      ROM / Strength   AROM / PROM / Strength  AROM;Strength      AROM   Overall AROM   Deficits;Due to precautions    Overall AROM Comments  bilateral shoulder flexion is limited to grossly 90 due to precautions, Pt also demonstrates limitations in finger extension for diigits 3, 4, 5 of left hand, -10 flexion contracture at PIP joint of middle finger, Pt demonstrates grossly 50% A/ROM wrist ext for LUE  triggering at L middle and ring fingers with gripping      Strength   Overall Strength  Deficits    Overall Strength Comments  proximal strength not tested due to precautions, elbow flexion / extension grossly 4+/5      Hand Function   Right Hand Grip (lbs)  55 lbs    Left Hand Grip (lbs)  -- Not tested due to triggering                      OT Education - 02/12/18 1713    Education provided  Yes    Education Details  Pt was provided with foam grip for utensils and for pen for  writing and educated in use, therapist reinforced back/ neck precautions and therapist recommended pt does not drive as he is in a cervical collar.    Person(s) Educated   Patient    Methods  Explanation;Demonstration    Comprehension  Verbalized understanding;Returned demonstration       OT Short Term Goals - 02/12/18 1258      OT SHORT TERM GOAL #1   Title  I with LUE positioning including splinting prn and precautions in order to minimize triggering and to improve functional use.    Time  4    Period  Weeks    Status  New    Target Date  03/14/18      OT SHORT TERM GOAL #2   Title  I with intial HEP.    Time  4    Period  Weeks    Status  New      OT SHORT TERM GOAL #3   Title  Pt will verbalize understanding of adapted strategies to maximize safety and I with ADLs/IADLS.(fasten buttons, writing)    Time  4    Period  Weeks    Status  New      OT SHORT TERM GOAL #4   Title  Pt will demonstrate ability to write his name with 100% legibility using AE prn.    Time  4    Period  Weeks    Status  New      OT SHORT TERM GOAL #5   Title  Pt will demonstrate improved LUE fine motor coordination for ADLs  as evidenced by decreasing 9 hole peg test score to 90 secs or less.    Time  4    Period  Weeks    Status  New        OT Long Term Goals - 02/12/18 1308      OT LONG TERM GOAL #1   Title  Pt will resume use of LUE as his dominant hand at least 75% of the time for ADLS/ IADLs.    Time  12    Period  Weeks    Status  New    Target Date  05/13/18      OT LONG TERM GOAL #2   Title  Pt will demonstrate ability to retrieve a lightweight object from overhead shelf at 120 shoulder flexion with right and left arms indivdually.    Time  12    Period  Weeks    Status  New      OT LONG TERM GOAL #3   Title  Pt will demonstrate improved fine motor coordination in LUE as evidenced by perfroming 9 hole peg test in 75 secs or less.    Time  12    Period  Weeks    Status  New      OT LONG TERM GOAL #4   Title  pt will perfrom light home managment/ cooking at a modified independent level.    Time  12    Period  Weeks    Status  New       OT LONG TERM GOAL #5   Title  Pt will demonstrate ability to write a short paragraph with 100% legibility using AE prn.    Time  12    Period  Weeks    Status  New            Plan - 02/12/18 1705    Clinical Impression Statement  Pt is  67 y.o  with hx of left trigger fingers, left radial n. palsy, s/p C5-T2 fusion, foraminotomies at C5,C6, 7, T11, and laminectomies C5, C6, C7, and T1 on 12/18/17 by Dr. Marykay Lex. Pt presents with the following deficits: decreased strength, decreased coordination, decreased UE functional use, sensory impairments, decreased balance, decreased A/ROM which impedes performance of ADLS/ IADLS. Pt. can benefit from skilled occupational therapy to maximize pt's safety and indpdnence with daily activities.    Occupational Profile and client history currently impacting functional performance  GBT:DVVO trigger fingers, left radial n. palsy, s/p C5-T2 fusion, foraminotomies at C5,C6, 7, T11, and laminectomies C5, C6, C7, and T1 on 12/18/17, lung nodules, emphysema, hyperlipidemia, arthritis Pt was completely I prior to his surgery, he enjoyed playing golf  Pt currently requires assistance for home management/ cooking and he is unable to play golf due to weakness and precautions.    Occupational performance deficits (Please refer to evaluation for details):  IADL's;ADL's;Leisure;Play;Social Participation    Rehab Potential  Good    Current Impairments/barriers affecting progress:  sensory impairment, trigger fingers,  back/ neck precautions    OT Frequency  2x / week    OT Duration  12 weeks    OT Treatment/Interventions  Self-care/ADL training;Therapeutic exercise;Visual/perceptual remediation/compensation;Patient/family education;Splinting;Neuromuscular education;Paraffin;Moist Heat;Fluidtherapy;Energy conservation;Functional Mobility Training;Balance training;Therapeutic activities;Cognitive remediation/compensation;Passive range of motion;Manual Therapy;DME and/or AE  instruction;Contrast Bath;Ultrasound;Cryotherapy    Plan  consider resting hand splint for night time wear    Clinical Decision Making  Limited treatment options, no task modification necessary    Consulted and Agree with Plan of Care  Patient       Patient will benefit from skilled therapeutic intervention in order to improve the following deficits and impairments:  Abnormal gait, Decreased knowledge of use of DME, Impaired flexibility, Pain, Decreased mobility, Decreased coordination, Decreased activity tolerance, Decreased endurance, Decreased range of motion, Decreased strength, Impaired UE functional use, Impaired perceived functional ability, Difficulty walking, Decreased safety awareness, Decreased knowledge of precautions, Decreased balance  Visit Diagnosis: Muscle weakness (generalized)  Stiffness of left hand, not elsewhere classified  Other disturbances of skin sensation  Unsteadiness on feet    Problem List Patient Active Problem List   Diagnosis Date Noted  . Gait abnormality 02/20/2017  . Restless leg syndrome 07/05/2016  . Constipation due to opioid therapy 12/22/2015  . Primary localized osteoarthritis of right knee 12/08/2015  . Bullous emphysema ([place]) 11/02/2015  . Essential hypertension 11/02/2015  . Arthritis 11/02/2015  . Hyperlipidemia 11/02/2015  . OSA on CPAP 11/02/2015  . GERD (gastroesophageal reflux disease) 11/02/2015  . Septic olecranon bursitis 06/24/2015  . Multiple lung nodules on CT 06/30/2014    Twanda Stakes 02/12/2018, 5:22 PM Theone Murdoch, OTR/L [phone] Phone: [phone] 5:23 PM 02/12/18 [facility] [address] [place], [name], [address] Phone: [phone]   Fax:  [phone]  Name: Jule C Lynch MRN: [id number] Date of Birth: [birth year]

## 2018-02-14 ENCOUNTER — Ambulatory Visit: Payer: Medicare Other | Admitting: Occupational Therapy

## 2018-02-14 DIAGNOSIS — R208 Other disturbances of skin sensation: Secondary | ICD-10-CM

## 2018-02-14 DIAGNOSIS — M25642 Stiffness of left hand, not elsewhere classified: Secondary | ICD-10-CM

## 2018-02-14 DIAGNOSIS — M6281 Muscle weakness (generalized): Secondary | ICD-10-CM

## 2018-02-14 DIAGNOSIS — R2681 Unsteadiness on feet: Secondary | ICD-10-CM | POA: Diagnosis not present

## 2018-02-14 NOTE — Patient Instructions (Signed)
Wear daytime finger splint while awake, remove if pain, swelling or changes to circulation    Your Splint- hard resting hand splint This splint should initially be fitted by a healthcare practitioner.  The healthcare practitioner is responsible for providing wearing instructions and precautions to the patient, other healthcare practitioners and care provider involved in the patient's care.  This splint was custom made for you. Please read the following instructions to learn about wearing and caring for your splint.  Precautions Should your splint cause any of the following problems, remove the splint immediately and contact your therapist/physician.  Swelling  Severe Pain  Pressure Areas  Stiffness  Numbness  Do not wear your splint while operating machinery unless it has been fabricated for that purpose.  When To Wear Your Splint Where your splint according to your therapist/physician instructions. Daytime for 1-2 hours then check skin, try to wear for 3-4 hours while awake if no problems transition to nightime wear,  Care and Cleaning of Your Splint 1. Keep your splint away from open flames. 2. Your splint will lose its shape in temperatures over 135 degrees Farenheit, ( in car windows, near radiators, ovens or in hot water).  Never make any adjustments to your splint, if the splint needs adjusting remove it and make an appointment to see your therapist. 3. Your splint, including the cushion liner may be cleaned with soap and lukewarm water.  Do not immerse in hot water over 135 degrees Farenheit. 4. Straps may be washed with soap and water, but do not moisten the self-adhesive portion.

## 2018-02-14 NOTE — Therapy (Signed)
[facility] [address] [place], [name], [address] Phone: [phone]   Fax:  [phone]  Occupational Therapy Treatment  Patient Details  Name: Richard Lynch MRN: [id number] Date of Birth: May 28, 1951 Referring Provider: Dr. Marykay Lex   Encounter Date: 02/14/2018  OT End of Session - 02/14/18 1626    Visit Number  2    Number of Visits  24    Date for OT Re-Evaluation  05/13/18    Authorization Type  Medicare    OT Start Time  1318    OT Stop Time  1402    OT Time Calculation (min)  44 min    Activity Tolerance  Patient tolerated treatment well    Behavior During Therapy  [facility] for tasks assessed/performed       Past Medical History:  Diagnosis Date  . Constipation due to opioid therapy 12/22/2015  . Emphysema lung ([place])   . Gait abnormality 02/20/2017  . GERD (gastroesophageal reflux disease)   . High cholesterol   . Hypertension   . OSA (obstructive sleep apnea)    on CPAP  . Primary localized osteoarthritis of right knee 12/08/2015  . Septic olecranon bursitis 06/2015   MSSA    Past Surgical History:  Procedure Laterality Date  . COLONOSCOPY W/ BIOPSIES AND POLYPECTOMY    . ELBOW SURGERY  08/2015   bilateral  . EYE SURGERY     'repair of tear to left eye from pliers'  . HERNIA REPAIR  07/2010  . KNEE SURGERY     3 times on both knee's each  . MULTIPLE TOOTH EXTRACTIONS    . SHOULDER ARTHROSCOPY    . TOTAL HIP ARTHROPLASTY  08/2010  . TOTAL KNEE ARTHROPLASTY Right 12/20/2015  . TOTAL KNEE ARTHROPLASTY Right 12/20/2015   Procedure: TOTAL KNEE ARTHROPLASTY;  Surgeon: [name], MD;  Location: [facility];  Service: Orthopedics;  Laterality: Right;    There were no vitals filed for this visit.  Subjective Assessment - 02/14/18 1625    Pertinent History  Pt with hx of trigger finger, left radial n. palsy, s/p C5-T2 fusion, foraminotomies at C5,C6, 7, T11, and laminectomies C5, C6, C7, and T1 on 12/18/17    Limitations  no bending, arching, and twisting, cervical collar at all times except for night time, showering and pt wears soift collar at night    Patient Stated Goals  to be able to play golf, to be able to write, and feed himself with LUE    Currently in Pain?  No/denies                Treatment: Therapist fabricated a custom resting hand splint and issued trigger finger splints for middle and ring fingers left hand. Pt performed A/ROM wrist extension and finger extension, buddy strp issued for pt to wear on middle and index finger to promote AA/ROM finger extension.             OT Education - 02/14/18 1625    Education provided  Yes    Education Details  resting hand splint wear care and precautions, trigger finger splints for middle and ring finger- wear, care, prec    Person(s) Educated  Patient    Methods  Explanation;Demonstration;Verbal cues;Handout    Comprehension  Returned demonstration;Verbalized understanding       OT Short Term Goals - 02/12/18 1258      OT SHORT TERM GOAL #1   Title  I with LUE positioning including splinting  prn and precautions in order to minimize triggering and to improve functional use.    Time  4    Period  Weeks    Status  New    Target Date  03/14/18      OT SHORT TERM GOAL #2   Title  I with intial HEP.    Time  4    Period  Weeks    Status  New      OT SHORT TERM GOAL #3   Title  Pt will verbalize understanding of adapted strategies to maximize safety and I with ADLs/IADLS.(fasten buttons, writing)    Time  4    Period  Weeks    Status  New      OT SHORT TERM GOAL #4   Title  Pt will demonstrate ability to write his name with 100% legibility using AE prn.    Time  4    Period  Weeks    Status  New      OT SHORT TERM GOAL #5   Title  Pt will demonstrate improved LUE fine motor coordination for ADLs  as evidenced by decreasing 9 hole peg test score to 90 secs or less.    Time  4    Period  Weeks    Status  New         OT Long Term Goals - 02/12/18 1308      OT LONG TERM GOAL #1   Title  Pt will resume use of LUE as his dominant hand at least 75% of the time for ADLS/ IADLs.    Time  12    Period  Weeks    Status  New    Target Date  05/13/18      OT LONG TERM GOAL #2   Title  Pt will demonstrate ability to retrieve a lightweight object from overhead shelf at 120 shoulder flexion with right and left arms indivdually.    Time  12    Period  Weeks    Status  New      OT LONG TERM GOAL #3   Title  Pt will demonstrate improved fine motor coordination in LUE as evidenced by perfroming 9 hole peg test in 75 secs or less.    Time  12    Period  Weeks    Status  New      OT LONG TERM GOAL #4   Title  pt will perfrom light home managment/ cooking at a modified independent level.    Time  12    Period  Weeks    Status  New      OT LONG TERM GOAL #5   Title  Pt will demonstrate ability to write a short paragraph with 100% legibility using AE prn.    Time  12    Period  Weeks    Status  New            Plan - 02/14/18 1627    Clinical Impression Statement  Pt was fitted with a custom resting hand splint and pre fab trigger finger splints for middle and ring fingers of left hand. Pt was educated in wear, care and precautions.     Rehab Potential  Good    Current Impairments/barriers affecting progress:  sensory impairment, trigger fingers,  back/ neck precautions    OT Frequency  2x / week    OT Duration  12 weeks    OT Treatment/Interventions  Self-care/ADL training;Therapeutic exercise;Visual/perceptual  remediation/compensation;Patient/family education;Splinting;Neuromuscular education;Paraffin;Moist Heat;Fluidtherapy;Energy conservation;Functional Mobility Training;Balance training;Therapeutic activities;Cognitive remediation/compensation;Passive range of motion;Manual Therapy;DME and/or AE instruction;Contrast Bath;Ultrasound;Cryotherapy    Plan  splint check and modification, issue  formal HEP    Consulted and Agree with Plan of Care  Patient       Patient will benefit from skilled therapeutic intervention in order to improve the following deficits and impairments:  Abnormal gait, Decreased knowledge of use of DME, Impaired flexibility, Pain, Decreased mobility, Decreased coordination, Decreased activity tolerance, Decreased endurance, Decreased range of motion, Decreased strength, Impaired UE functional use, Impaired perceived functional ability, Difficulty walking, Decreased safety awareness, Decreased knowledge of precautions, Decreased balance  Visit Diagnosis: Muscle weakness (generalized)  Stiffness of left hand, not elsewhere classified  Other disturbances of skin sensation    Problem List Patient Active Problem List   Diagnosis Date Noted  . Gait abnormality 02/20/2017  . Restless leg syndrome 07/05/2016  . Constipation due to opioid therapy 12/22/2015  . Primary localized osteoarthritis of right knee 12/08/2015  . Bullous emphysema ([place]) 11/02/2015  . Essential hypertension 11/02/2015  . Arthritis 11/02/2015  . Hyperlipidemia 11/02/2015  . OSA on CPAP 11/02/2015  . GERD (gastroesophageal reflux disease) 11/02/2015  . Septic olecranon bursitis 06/24/2015  . Multiple lung nodules on CT 06/30/2014    RINE,KATHRYN 02/14/2018, 4:28 PM  [facility] [address] [place], [name], [address] Phone: [phone]   Fax:  [phone]  Name: Richard Lynch MRN: [id number] Date of Birth: 10/11/1951

## 2018-02-20 ENCOUNTER — Ambulatory Visit: Payer: Medicare Other | Attending: Family Medicine | Admitting: *Deleted

## 2018-02-20 ENCOUNTER — Encounter: Payer: Self-pay | Admitting: *Deleted

## 2018-02-20 DIAGNOSIS — R2689 Other abnormalities of gait and mobility: Secondary | ICD-10-CM | POA: Insufficient documentation

## 2018-02-20 DIAGNOSIS — R2681 Unsteadiness on feet: Secondary | ICD-10-CM | POA: Diagnosis not present

## 2018-02-20 DIAGNOSIS — H8111 Benign paroxysmal vertigo, right ear: Secondary | ICD-10-CM | POA: Diagnosis not present

## 2018-02-20 DIAGNOSIS — M25642 Stiffness of left hand, not elsewhere classified: Secondary | ICD-10-CM | POA: Insufficient documentation

## 2018-02-20 DIAGNOSIS — R208 Other disturbances of skin sensation: Secondary | ICD-10-CM | POA: Insufficient documentation

## 2018-02-20 DIAGNOSIS — M6281 Muscle weakness (generalized): Secondary | ICD-10-CM | POA: Insufficient documentation

## 2018-02-20 NOTE — Therapy (Signed)
[facility] [address] [place], [name], [address] Phone: [phone]   Fax:  [phone]  Occupational Therapy Treatment  Patient Details  Name: Richard Lynch MRN: [id number] Date of Birth: 1951/01/16 Referring Provider: Dr. Marykay Lex   Encounter Date: 02/20/2018  OT End of Session - 02/20/18 1151    Visit Number  3    Number of Visits  24    Date for OT Re-Evaluation  05/13/18    Authorization Type  Medicare    Authorization Time Period  90 days    OT Start Time  1100    OT Stop Time  1138    OT Time Calculation (min)  38 min    Activity Tolerance  Patient tolerated treatment well    Behavior During Therapy  [facility] for tasks assessed/performed       Past Medical History:  Diagnosis Date  . Constipation due to opioid therapy 12/22/2015  . Emphysema lung ([place])   . Gait abnormality 02/20/2017  . GERD (gastroesophageal reflux disease)   . High cholesterol   . Hypertension   . OSA (obstructive sleep apnea)    on CPAP  . Primary localized osteoarthritis of right knee 12/08/2015  . Septic olecranon bursitis 06/2015   MSSA    Past Surgical History:  Procedure Laterality Date  . COLONOSCOPY W/ BIOPSIES AND POLYPECTOMY    . ELBOW SURGERY  08/2015   bilateral  . EYE SURGERY     'repair of tear to left eye from pliers'  . HERNIA REPAIR  07/2010  . KNEE SURGERY     3 times on both knee's each  . MULTIPLE TOOTH EXTRACTIONS    . SHOULDER ARTHROSCOPY    . TOTAL HIP ARTHROPLASTY  08/2010  . TOTAL KNEE ARTHROPLASTY Right 12/20/2015  . TOTAL KNEE ARTHROPLASTY Right 12/20/2015   Procedure: TOTAL KNEE ARTHROPLASTY;  Surgeon: [name], MD;  Location: [facility];  Service: Orthopedics;  Laterality: Right;    There were no vitals filed for this visit.  Subjective Assessment - 02/20/18 1104    Subjective   Pt reports that fingers on left hand are "still popping/locking" with use of trigger finger block prefab splints, "just not  as hard". He also states that wrist /hand is stiff after wearing noc resting hand.    Pertinent History  Pt with hx of trigger finger, left radial n. palsy, s/p C5-T2 fusion, foraminotomies at C5,C6, 7, T11, and laminectomies C5, C6, C7, and T1 on 12/18/17    Limitations  no bending, arching, and twisting, cervical collar at all times except for night time, showering and pt wears soift collar at night    Patient Stated Goals  to be able to play golf, to be able to write, and feed himself with LUE    Currently in Pain?  No/denies    Multiple Pain Sites  No       OT Treatment: Pt did not bring resting hand splint to this visit but reports that it is well fitting. C/o finger stiffness first thing in the morning that resolves fairly quickly per his report. He is Mod I trigger finger splints as observed in clinic today.   Pt performed all of the exercises as stated below L x38 min.    1) Extension (Active With Finger Extension)    With forearm on table and wrist over edge, lift hand with fingers straight. Hold __5__ seconds. Repeat __10__ times. Do __2_ sessions per day.  2) FINGERS: Extension    Use opposite hand to straighten fingers. Hold _5__ seconds. __10_ reps, _2x__  per day.    3) FINGER: Extension    Rest hand on surface, palm down. Open fingers until hand is flat. _5-10_ reps, _2x__ per day   4) Pencil exercise: place pencil on tabletop and support left forearm, flex fingers (not all the way - to avoid triggering) and extend fingers to "Push" pencil across table.  5) Card Exercise: Get a deck of cards and support left forearm on raised surface, flex fingers (not all the way - to avoid triggering) and extend fingers to "Push" cards off top of the deck, repeat. Goal is finger extension.    OT Education - 02/20/18 1149    Education provided  Yes    Education Details  Review of splint use, care and precautions (resting hand/triger finger for MF/RF); HEP Left hand to  encourage finger extension.    Person(s) Educated  Patient    Methods  Explanation;Demonstration;Verbal cues;Handout    Comprehension  Verbalized understanding;Returned demonstration       OT Short Term Goals - 02/12/18 1258      OT SHORT TERM GOAL #1   Title  I with LUE positioning including splinting prn and precautions in order to minimize triggering and to improve functional use.    Time  4    Period  Weeks    Status  New    Target Date  03/14/18      OT SHORT TERM GOAL #2   Title  I with intial HEP.    Time  4    Period  Weeks    Status  New      OT SHORT TERM GOAL #3   Title  Pt will verbalize understanding of adapted strategies to maximize safety and I with ADLs/IADLS.(fasten buttons, writing)    Time  4    Period  Weeks    Status  New      OT SHORT TERM GOAL #4   Title  Pt will demonstrate ability to write his name with 100% legibility using AE prn.    Time  4    Period  Weeks    Status  New      OT SHORT TERM GOAL #5   Title  Pt will demonstrate improved LUE fine motor coordination for ADLs  as evidenced by decreasing 9 hole peg test score to 90 secs or less.    Time  4    Period  Weeks    Status  New        OT Long Term Goals - 02/12/18 1308      OT LONG TERM GOAL #1   Title  Pt will resume use of LUE as his dominant hand at least 75% of the time for ADLS/ IADLs.    Time  12    Period  Weeks    Status  New    Target Date  05/13/18      OT LONG TERM GOAL #2   Title  Pt will demonstrate ability to retrieve a lightweight object from overhead shelf at 120 shoulder flexion with right and left arms indivdually.    Time  12    Period  Weeks    Status  New      OT LONG TERM GOAL #3   Title  Pt will demonstrate improved fine motor coordination in LUE as evidenced by perfroming 9 hole peg test in 75  secs or less.    Time  12    Period  Weeks    Status  New      OT LONG TERM GOAL #4   Title  pt will perfrom light home managment/ cooking at a modified  independent level.    Time  12    Period  Weeks    Status  New      OT LONG TERM GOAL #5   Title  Pt will demonstrate ability to write a short paragraph with 100% legibility using AE prn.    Time  12    Period  Weeks    Status  New            Plan - 02/20/18 1151    Clinical Impression Statement  Pt expresses frustration with lack of A/ROM, he should benefit from home program to assist with L finger extension. Pt will also bring splint to next visit for check and adjustments PRN.    Occupational Profile and client history currently impacting functional performance  OVF:IEPP trigger fingers, left radial n. palsy, s/p C5-T2 fusion, foraminotomies at C5,C6, 7, T11, and laminectomies C5, C6, C7, and T1 on 12/18/17, lung nodules, emphysema, hyperlipidemia, arthritis Pt was completely I prior to his surgery, he enjoyed playing golf  Pt currently requires assistance for home management/ cooking and he is unable to play golf due to weakness and precautions.    Rehab Potential  Good    Current Impairments/barriers affecting progress:  sensory impairment, trigger fingers,  back/ neck precautions    OT Frequency  2x / week    OT Duration  12 weeks    OT Treatment/Interventions  Self-care/ADL training;Therapeutic exercise;Visual/perceptual remediation/compensation;Patient/family education;Splinting;Neuromuscular education;Paraffin;Moist Heat;Fluidtherapy;Energy conservation;Functional Mobility Training;Balance training;Therapeutic activities;Cognitive remediation/compensation;Passive range of motion;Manual Therapy;DME and/or AE instruction;Contrast Bath;Ultrasound;Cryotherapy    Plan  Review, perform HEP as issued on 02/20/18, L resting hand splint check and adjustment PRN.    Clinical Decision Making  Limited treatment options, no task modification necessary    Consulted and Agree with Plan of Care  Patient       Patient will benefit from skilled therapeutic intervention in order to improve the  following deficits and impairments:  Abnormal gait, Decreased knowledge of use of DME, Impaired flexibility, Pain, Decreased mobility, Decreased coordination, Decreased activity tolerance, Decreased endurance, Decreased range of motion, Decreased strength, Impaired UE functional use, Impaired perceived functional ability, Difficulty walking, Decreased safety awareness, Decreased knowledge of precautions, Decreased balance  Visit Diagnosis: Muscle weakness (generalized)  Stiffness of left hand, not elsewhere classified  Other disturbances of skin sensation  Unsteadiness on feet    Problem List Patient Active Problem List   Diagnosis Date Noted  . Gait abnormality 02/20/2017  . Restless leg syndrome 07/05/2016  . Constipation due to opioid therapy 12/22/2015  . Primary localized osteoarthritis of right knee 12/08/2015  . Bullous emphysema ([place]) 11/02/2015  . Essential hypertension 11/02/2015  . Arthritis 11/02/2015  . Hyperlipidemia 11/02/2015  . OSA on CPAP 11/02/2015  . GERD (gastroesophageal reflux disease) 11/02/2015  . Septic olecranon bursitis 06/24/2015  . Multiple lung nodules on CT 06/30/2014    Percell Miller Ardath Sax, OTR/L 02/20/2018, 11:56 AM  [facility] [address] [place], [name], [address] Phone: [phone]   Fax:  [phone]  Name: Richard Lynch MRN: [id number] Date of Birth: 1951-09-01

## 2018-02-20 NOTE — Patient Instructions (Addendum)
1) Extension (Active With Finger Extension)    With forearm on table and wrist over edge, lift hand with fingers straight. Hold __5__ seconds. Repeat __10__ times. Do __2_ sessions per day.    2) FINGERS: Extension    Use opposite hand to straighten fingers. Hold _5__ seconds. __10_ reps, _2x__  per day.    3) FINGER: Extension    Rest hand on surface, palm down. Open fingers until hand is flat. _5-10_ reps, _2x__ per day   4) Pencil exercise: place pencil on tabletop and support left forearm, flex fingers (not all the way - to avoid triggering) and extend fingers to "Push" pencil across table.  5) Card Exercise: Get a deck of cards and support left forearm on raised surface, flex fingers (not all the way - to avoid triggering) and extend fingers to "Push" cards off top of the deck, repeat. Goal is finger extension.

## 2018-02-22 ENCOUNTER — Encounter: Payer: Self-pay | Admitting: Occupational Therapy

## 2018-02-22 ENCOUNTER — Ambulatory Visit: Payer: Medicare Other | Admitting: Occupational Therapy

## 2018-02-22 DIAGNOSIS — R208 Other disturbances of skin sensation: Secondary | ICD-10-CM

## 2018-02-22 DIAGNOSIS — R2681 Unsteadiness on feet: Secondary | ICD-10-CM | POA: Diagnosis not present

## 2018-02-22 DIAGNOSIS — M6281 Muscle weakness (generalized): Secondary | ICD-10-CM | POA: Diagnosis not present

## 2018-02-22 DIAGNOSIS — M25642 Stiffness of left hand, not elsewhere classified: Secondary | ICD-10-CM

## 2018-02-22 DIAGNOSIS — H8111 Benign paroxysmal vertigo, right ear: Secondary | ICD-10-CM | POA: Diagnosis not present

## 2018-02-22 DIAGNOSIS — R2689 Other abnormalities of gait and mobility: Secondary | ICD-10-CM | POA: Diagnosis not present

## 2018-02-22 NOTE — Patient Instructions (Signed)
Wear your middle finger oval -8 splint for 30 mins to 1 hour for extension(big part under finger, spin splint around if you need to grip). Stop wearing black finger splints

## 2018-02-22 NOTE — Therapy (Signed)
[facility] [address] [place], [name], [address] Phone: [phone]   Fax:  [phone]  Occupational Therapy Treatment  Patient Details  Name: Richard Lynch MRN: [id number] Date of Birth: 1950/11/21 Referring Provider: Dr. Marykay Lex   Encounter Date: 02/22/2018  OT End of Session - 02/22/18 1459    Visit Number  4    Number of Visits  24    Date for OT Re-Evaluation  05/13/18    Authorization Type  Medicare    Authorization Time Period  90 days    OT Start Time  1405    OT Stop Time  1455    OT Time Calculation (min)  50 min    Activity Tolerance  Patient tolerated treatment well    Behavior During Therapy  [facility] for tasks assessed/performed       Past Medical History:  Diagnosis Date  . Constipation due to opioid therapy 12/22/2015  . Emphysema lung ([place])   . Gait abnormality 02/20/2017  . GERD (gastroesophageal reflux disease)   . High cholesterol   . Hypertension   . OSA (obstructive sleep apnea)    on CPAP  . Primary localized osteoarthritis of right knee 12/08/2015  . Septic olecranon bursitis 06/2015   MSSA    Past Surgical History:  Procedure Laterality Date  . COLONOSCOPY W/ BIOPSIES AND POLYPECTOMY    . ELBOW SURGERY  08/2015   bilateral  . EYE SURGERY     'repair of tear to left eye from pliers'  . HERNIA REPAIR  07/2010  . KNEE SURGERY     3 times on both knee's each  . MULTIPLE TOOTH EXTRACTIONS    . SHOULDER ARTHROSCOPY    . TOTAL HIP ARTHROPLASTY  08/2010  . TOTAL KNEE ARTHROPLASTY Right 12/20/2015  . TOTAL KNEE ARTHROPLASTY Right 12/20/2015   Procedure: TOTAL KNEE ARTHROPLASTY;  Surgeon: [name], MD;  Location: [facility];  Service: Orthopedics;  Laterality: Right;    There were no vitals filed for this visit.  Subjective Assessment - 02/22/18 1458    Pertinent History  Pt with hx of trigger finger, left radial n. palsy, s/p C5-T2 fusion, foraminotomies at C5,C6, 7, T11, and  laminectomies C5, C6, C7, and T1 on 12/18/17    Patient Stated Goals  to be able to play golf, to be able to write, and feed himself with LUE    Currently in Pain?  No/denies         treatment: [place] to left palm and digits to minimize triggering,  3 mhz, 0.8w/cm2, 50% pulsed x 8 mins, no adverse reactions. NMES to wrist and finger extensors with buddy strap on middle and ring fingers 50 pps, 250 pw, 10 secs cycle, intensity 30, x 10 mins, pt demonstrates greater response at wrist than fingers. Pt performing A/ROM, AAROM during on cycle. Splint check for resting hand splint, pts splint is fitting well. Pre Reviewed HEP issued last visit, 10 reps each min v.c Pt was issued oval 8 for middle finger PIP to promote extension during the daytime, and instructed in wear, care. Pt was instructed to remove if any issues/ problems.                     OT Short Term Goals - 02/12/18 1258      OT SHORT TERM GOAL #1   Title  I with LUE positioning including splinting prn and precautions in order to minimize triggering and to improve  functional use.    Time  4    Period  Weeks    Status  New    Target Date  03/14/18      OT SHORT TERM GOAL #2   Title  I with intial HEP.    Time  4    Period  Weeks    Status  New      OT SHORT TERM GOAL #3   Title  Pt will verbalize understanding of adapted strategies to maximize safety and I with ADLs/IADLS.(fasten buttons, writing)    Time  4    Period  Weeks    Status  New      OT SHORT TERM GOAL #4   Title  Pt will demonstrate ability to write his name with 100% legibility using AE prn.    Time  4    Period  Weeks    Status  New      OT SHORT TERM GOAL #5   Title  Pt will demonstrate improved LUE fine motor coordination for ADLs  as evidenced by decreasing 9 hole peg test score to 90 secs or less.    Time  4    Period  Weeks    Status  New        OT Long Term Goals - 02/12/18 1308      OT LONG TERM GOAL #1   Title  Pt will  resume use of LUE as his dominant hand at least 75% of the time for ADLS/ IADLs.    Time  12    Period  Weeks    Status  New    Target Date  05/13/18      OT LONG TERM GOAL #2   Title  Pt will demonstrate ability to retrieve a lightweight object from overhead shelf at 120 shoulder flexion with right and left arms indivdually.    Time  12    Period  Weeks    Status  New      OT LONG TERM GOAL #3   Title  Pt will demonstrate improved fine motor coordination in LUE as evidenced by perfroming 9 hole peg test in 75 secs or less.    Time  12    Period  Weeks    Status  New      OT LONG TERM GOAL #4   Title  pt will perfrom light home managment/ cooking at a modified independent level.    Time  12    Period  Weeks    Status  New      OT LONG TERM GOAL #5   Title  Pt will demonstrate ability to write a short paragraph with 100% legibility using AE prn.    Time  12    Period  Weeks    Status  New            Plan - 02/22/18 1500    Clinical Impression Statement  Pt's nighttime splint is fitting well without problems. Therapist d/c pre-fab tigger finger splints as they appear too tight and pt has reddened raised bump/ blister on middle finger that appears related to splint.    Occupational Profile and client history currently impacting functional performance  RJJ:OACZ trigger fingers, left radial n. palsy, s/p C5-T2 fusion, foraminotomies at C5,C6, 7, T11, and laminectomies C5, C6, C7, and T1 on 12/18/17, lung nodules, emphysema, hyperlipidemia, arthritis Pt was completely I prior to his surgery, he enjoyed playing golf  Pt currently requires  assistance for home management/ cooking and he is unable to play golf due to weakness and precautions.    Occupational performance deficits (Please refer to evaluation for details):  IADL's;ADL's;Leisure;Play;Social Participation    Rehab Potential  Good    Current Impairments/barriers affecting progress:  sensory impairment, trigger fingers,   back/ neck precautions    OT Frequency  2x / week    OT Duration  12 weeks    OT Treatment/Interventions  Self-care/ADL training;Therapeutic exercise;Visual/perceptual remediation/compensation;Patient/family education;Splinting;Neuromuscular education;Paraffin;Moist Heat;Fluidtherapy;Energy conservation;Functional Mobility Training;Balance training;Therapeutic activities;Cognitive remediation/compensation;Passive range of motion;Manual Therapy;DME and/or AE instruction;Contrast Bath;Ultrasound;Cryotherapy    Plan  check on oval 8 splint, continue estim, [place] prn    Consulted and Agree with Plan of Care  Patient       Patient will benefit from skilled therapeutic intervention in order to improve the following deficits and impairments:  Abnormal gait, Decreased knowledge of use of DME, Impaired flexibility, Pain, Decreased mobility, Decreased coordination, Decreased activity tolerance, Decreased endurance, Decreased range of motion, Decreased strength, Impaired UE functional use, Impaired perceived functional ability, Difficulty walking, Decreased safety awareness, Decreased knowledge of precautions, Decreased balance  Visit Diagnosis: Muscle weakness (generalized)  Stiffness of left hand, not elsewhere classified  Other disturbances of skin sensation    Problem List Patient Active Problem List   Diagnosis Date Noted  . Gait abnormality 02/20/2017  . Restless leg syndrome 07/05/2016  . Constipation due to opioid therapy 12/22/2015  . Primary localized osteoarthritis of right knee 12/08/2015  . Bullous emphysema ([place]) 11/02/2015  . Essential hypertension 11/02/2015  . Arthritis 11/02/2015  . Hyperlipidemia 11/02/2015  . OSA on CPAP 11/02/2015  . GERD (gastroesophageal reflux disease) 11/02/2015  . Septic olecranon bursitis 06/24/2015  . Multiple lung nodules on CT 06/30/2014    RINE,KATHRYN 02/22/2018, 3:06 PM  [facility] [address] [place], [name], [address] Phone: [phone]   Fax:  [phone]  Name: Bruno C Lynch MRN: [id number] Date of Birth: [birth year]

## 2018-02-25 ENCOUNTER — Ambulatory Visit: Payer: Medicare Other | Admitting: Occupational Therapy

## 2018-02-25 ENCOUNTER — Ambulatory Visit: Payer: Medicare Other | Admitting: Rehabilitative and Restorative Service Providers"

## 2018-02-25 DIAGNOSIS — R2681 Unsteadiness on feet: Secondary | ICD-10-CM | POA: Diagnosis not present

## 2018-02-25 DIAGNOSIS — H8111 Benign paroxysmal vertigo, right ear: Secondary | ICD-10-CM | POA: Diagnosis not present

## 2018-02-25 DIAGNOSIS — M25642 Stiffness of left hand, not elsewhere classified: Secondary | ICD-10-CM

## 2018-02-25 DIAGNOSIS — M6281 Muscle weakness (generalized): Secondary | ICD-10-CM

## 2018-02-25 DIAGNOSIS — R208 Other disturbances of skin sensation: Secondary | ICD-10-CM

## 2018-02-25 DIAGNOSIS — R2689 Other abnormalities of gait and mobility: Secondary | ICD-10-CM

## 2018-02-25 NOTE — Patient Instructions (Signed)
Habituation - Tip Card  1.The goal of habituation training is to assist in decreasing symptoms of vertigo, dizziness, or nausea provoked by specific head and body motions. 2.These exercises may initially increase symptoms; however, be persistent and work through symptoms. With repetition and time, the exercises will assist in reducing or eliminating symptoms. 3.Exercises should be stopped and discussed with the therapist if you experience any of the following: - Sudden change or fluctuation in hearing - New onset of ringing in the ears, or increase in current intensity - Any fluid discharge from the ear - Severe pain in neck or back - Extreme nausea  Copyright  VHI. All rights reserved.   Habituation - Sit to Side-Lying  MODIFIED DUE TO NECK BRACE.  WEAR HARD COLLAR TO PROTECT YOUR NECK DURING THIS EXERCISE. Sit on edge of bed.  *turn your whole body 45 degrees to the left.  Lie down onto your back towards the right side.  Hold until dizziness stops + 20 seconds.  Return to sitting.  Hold until dizziness settles + 20 seconds.   Turn your whole body 45 degrees to the right.  Lie down onto your back towards the left side.  Hold until dizziness stop + 20 seconds.   Repeat sequence 5 times per session. Do 2 sessions per day.  Copyright  VHI. All rights reserved.

## 2018-02-25 NOTE — Therapy (Signed)
[facility] [address] [place], [name], [address] Phone: [phone]   Fax:  [phone]  Physical Therapy Evaluation  Patient Details  Name: Richard Lynch MRN: [id number] Date of Birth: April 21, 1951 Referring Provider: Dr. Marykay Lex   Encounter Date: 02/25/2018  PT End of Session - 02/25/18 1345    Visit Number  1    Number of Visits  17 eval + 2x/week x 8 weeks    Date for PT Re-Evaluation  04/26/18    Authorization Type  Medicare a/b    PT Start Time  1233    PT Stop Time  1320    PT Time Calculation (min)  47 min    Activity Tolerance  Patient tolerated treatment well    Behavior During Therapy  [facility] for tasks assessed/performed       Past Medical History:  Diagnosis Date  . Constipation due to opioid therapy 12/22/2015  . Emphysema lung ([place])   . Gait abnormality 02/20/2017  . GERD (gastroesophageal reflux disease)   . High cholesterol   . Hypertension   . OSA (obstructive sleep apnea)    on CPAP  . Primary localized osteoarthritis of right knee 12/08/2015  . Septic olecranon bursitis 06/2015   MSSA    Past Surgical History:  Procedure Laterality Date  . COLONOSCOPY W/ BIOPSIES AND POLYPECTOMY    . ELBOW SURGERY  08/2015   bilateral  . EYE SURGERY     'repair of tear to left eye from pliers'  . HERNIA REPAIR  07/2010  . KNEE SURGERY     3 times on both knee's each  . MULTIPLE TOOTH EXTRACTIONS    . SHOULDER ARTHROSCOPY    . TOTAL HIP ARTHROPLASTY  08/2010  . TOTAL KNEE ARTHROPLASTY Right 12/20/2015  . TOTAL KNEE ARTHROPLASTY Right 12/20/2015   Procedure: TOTAL KNEE ARTHROPLASTY;  Surgeon: [name], MD;  Location: [facility];  Service: Orthopedics;  Laterality: Right;    There were no vitals filed for this visit.   Subjective Assessment - 02/25/18 1240    Subjective  The patient notes worsening symptoms of imbalance, gait (tripped frequently) and hand/arm symptoms (pain/numbness) x approximately 1-2  years.  He underwent cervical decompression surgery for cervical myelopathy with C5-T2 fusion, foraminotomies at C5,C6, 7, T11, and laminectomies C5, C6, C7, and T1 on 12/18/17.  He notes a bout of vertigo that came on when he was asleep with the whole room spinning.   He notes it improved throughout the day, but "it was there".  He notes "as soon as I rolled over this morning it was there."   He reports no falls since surgery.      Pertinent History  s/p C5-T2 fusion, foraminotomies at C5,C6, 7, T11, and laminectomies C5, C6, C7, and T1 on 12/18/17, emphysema, sleep apnea; L4-L5 cyst per epic notes/chart review    Patient Stated Goals  Improve balance and improve dizziness "get rid of dizziness".    Currently in Pain?  No/denies         [facility] PT Assessment - 02/25/18 1248      Assessment   Medical Diagnosis  C5-T1 fusion and laminectomies C5-T1    Referring Provider  Dr. Marykay Lex    Onset Date/Surgical Date  12/18/17    Hand Dominance  Left    Prior Therapy  none      Precautions   Precautions  Cervical;Back    Precaution Comments  no bending arching twisting, cervical collar (except  when in the shower), soft collar at night, no lifting > 15 lbs    Required Braces or Orthoses  Cervical Brace    Cervical Brace  Hard collar sleeps in soft collar, no collar during shower      Restrictions   Other Position/Activity Restrictions  no lifting > 15 lbs, upper back precautions      Balance Screen   Has the patient fallen in the past 6 months  No    Has the patient had a decrease in activity level because of a fear of falling?   No    Is the patient reluctant to leave their home because of a fear of falling?   No      Home Film/video editor residence    Living Arrangements  -- significant other    Type of [place] to enter    Entrance Stairs-Number of Steps  5    Entrance Stairs-Rails  Left    Home Layout  Two level;Able to live on main level  with bedroom/bathroom      Prior Function   Level of Independence  Independent    Vocation  Retired    Leisure  golf, boating      Sensation   Additional Comments  Numbness in both feet, constant in nature.  Can come up to sock distribution and then down to just toes.      ROM / Strength   AROM / PROM / Strength  AROM;Strength      AROM   Overall AROM   Within functional limits for tasks performed    Overall AROM Comments  for LEs.       Strength   Overall Strength  Deficits    Overall Strength Comments  bilat hip flexion 5/5, knee flexion is 5/5 right and 4/5 left, knee extension is 5/5 bilateraly, ankle dorsiflexion is 5/5 right, 4+/5 left.      Ambulation/Gait   Ambulation/Gait  Yes    Ambulation/Gait Assistance  5: Supervision    Ambulation/Gait Assistance Details  Patient has to stand and wait 1 minute due to dizziness before walking.  He veers from midline frequently during gait and slows pace + reaches out for wall support intermittently.      Ambulation Distance (Feet)  100 Feet    Assistive device  None    Gait Pattern  Narrow base of support veers from midline    Ambulation Surface  Level;Indoor           Vestibular Assessment - 02/25/18 1254      Vestibular Assessment   General Observation  The patient notes h/o episodic vertigo.      Symptom Behavior   Type of Dizziness  Spinning imbalance is always there    Frequency of Dizziness  daily    Duration of Dizziness  spinning lasts seconds; imbalance anytime on his feet    Aggravating Factors  Activity in general;Turning head quickly;Turning body quickly    Relieving Factors  Head stationary      Occulomotor Exam   Occulomotor Alignment  Normal    Spontaneous  Absent    Gaze-induced  Absent    Smooth Pursuits  Intact    Saccades  Intact    Comment  "my vision is horrible, I can't see".  Blurry in the mornings, can be intermittent blurriness at home.      Vestibulo-Occular Reflex   VOR 1  Head Only (x 1  viewing)  *unable due to no cervical rotation      Visual Acuity   Static  deferred due to no cervical rotation      Positional Testing   Sidelying Test  Sidelying Right;Sidelying Left    Horizontal Canal Testing  Horizontal Canal Right;Horizontal Canal Left      Sidelying Right   Sidelying Right Duration  5 second latency with 20 second duration    Sidelying Right Symptoms  Upbeat, right rotatory nystagmus      Sidelying Left   Sidelying Left Duration  0    Sidelying Left Symptoms  No nystagmus      Horizontal Canal Right   Horizontal Canal Right Duration  0    Horizontal Canal Right Symptoms  Normal      Horizontal Canal Left   Horizontal Canal Left Duration  0    Horizontal Canal Left Symptoms  Normal          Objective measurements completed on examination: See above findings.       Vestibular Treatment/Exercise - 02/25/18 1311      Vestibular Treatment/Exercise   Vestibular Treatment Provided  Habituation    Habituation Exercises  Nestor Lewandowsky modified      Nestor Lewandowsky   Number of Reps   3    Symptom Description   Patient did not have further provocation of symptoms after initial sidelying test.  Also anticipate rolling may be appropriate, however did not provoke any symptoms today.            PT Education - 02/25/18 1338    Education provided  Yes    Education Details  HEP: modified brandt daroff habituation exercises  (discussed/ demo'd and tried moving body versus neck to align posterior canal for gravitational pull)    Person(s) Educated  Patient    Methods  Explanation;Demonstration;Handout    Comprehension  Verbalized understanding;Returned demonstration       PT Short Term Goals - 02/25/18 1348      PT SHORT TERM GOAL #1   Title  The patient will be indep with modified habituation program.    Time  4    Period  Weeks    Target Date  03/27/18      PT SHORT TERM GOAL #2   Title  The patient will subjectively report no dizziness with  bed mobility in the morning.    Time  4    Period  Weeks    Target Date  03/27/18      PT SHORT TERM GOAL #3   Title  Assess standing balance with Berg and FGA and write LTGs.    Time  4    Period  Weeks    Target Date  03/27/18      PT SHORT TERM GOAL #4   Title  Assess gait speed and write goal for LTGs.    Time  4    Period  Weeks    Target Date  03/27/18        PT Long Term Goals - 02/25/18 1349      PT LONG TERM GOAL #1   Title  The patient will be indep with HEP for LE strengthening, balance, and gait activities.    Time  8    Period  Weeks    Target Date  04/26/18      PT LONG TERM GOAL #2   Title  Write LTG for [employer].  Time  8    Period  Weeks    Target Date  04/26/18      PT LONG TERM GOAL #3   Title  Write LTG for gait speed.    Time  8    Period  Weeks    Target Date  04/26/18      PT LONG TERM GOAL #4   Title  Write LTG for FGA.    Time  8    Period  Weeks    Target Date  04/26/18             Plan - 02/25/18 1350    Clinical Impression Statement  The patient is a 67 yo male s/p cervical deccompression surgery 12/18/2017.  He presents with h/o worsening balance and gait x 1-2 years prior to surgical intervention for myelopathy.  At today's evaluation, impairments include peripheral neuropathy with dec'd sensation bilateral distal LEs, decreased ankle strength, imbalance during gait activities, dec'd multi-sensory balance use.  Patient also has positive R sidelying test (modified due to cervical precautions).  PT to address deficits to improve balance and stability for IADLs and recreational tasks.     History and Personal Factors relevant to plan of care:  Surgery 12/18/17,  peripheral neuropathy    Clinical Presentation  Stable    Clinical Decision Making  Low    Rehab Potential  Good    PT Frequency  2x / week + eval    PT Duration  8 weeks    PT Treatment/Interventions  ADLs/Self Care Home Management;Balance training;Neuromuscular  re-education;Canalith Repostioning;Vestibular;Manual techniques;Patient/family education;Gait training;Functional mobility training;Therapeutic activities;Therapeutic exercise;Moist Heat    PT Next Visit Plan  Establish balance HEP, assess baselines for gait speed, berg and appropriate components of FGA ( avoiding head motion components)    Consulted and Agree with Plan of Care  Patient       Patient will benefit from skilled therapeutic intervention in order to improve the following deficits and impairments:  Abnormal gait, Impaired sensation, Dizziness, Decreased activity tolerance, Decreased strength, Decreased balance, Postural dysfunction, Decreased range of motion  Visit Diagnosis: BPPV (benign paroxysmal positional vertigo), right  Muscle weakness (generalized)  Unsteadiness on feet  Other abnormalities of gait and mobility     Problem List Patient Active Problem List   Diagnosis Date Noted  . Gait abnormality 02/20/2017  . Restless leg syndrome 07/05/2016  . Constipation due to opioid therapy 12/22/2015  . Primary localized osteoarthritis of right knee 12/08/2015  . Bullous emphysema ([place]) 11/02/2015  . Essential hypertension 11/02/2015  . Arthritis 11/02/2015  . Hyperlipidemia 11/02/2015  . OSA on CPAP 11/02/2015  . GERD (gastroesophageal reflux disease) 11/02/2015  . Septic olecranon bursitis 06/24/2015  . Multiple lung nodules on CT 06/30/2014    Erich Kochan, PT 02/25/2018, 1:55 PM  [facility] [address] [place], [name], [address] Phone: [phone]   Fax:  [phone]  Name: Richard Lynch MRN: [id number] Date of Birth: [birth year]

## 2018-02-25 NOTE — Patient Instructions (Signed)
Wear your trigger finger splint or oval 8 most all the time, avoid  A firm composite grip, switch splints if you have pain, pressure areas or redness from splint, you can wear resting splint at night. Goal is to avoid heavy grip for 6 weeks

## 2018-02-25 NOTE — Therapy (Signed)
[facility] [address] [place], [name], [address] Phone: [phone]   Fax:  [phone]  Occupational Therapy Treatment  Patient Details  Name: Richard Lynch MRN: [id number] Date of Birth: August 02, 1951 Referring Provider: Dr. Marykay Lex   Encounter Date: 02/25/2018  OT End of Session - 02/25/18 2114    Visit Number  5    Number of Visits  24    Date for OT Re-Evaluation  05/13/18    Authorization Type  Medicare    Authorization Time Period  90 days    OT Start Time  1155 2 units only    OT Stop Time  1230    OT Time Calculation (min)  35 min    Activity Tolerance  Patient tolerated treatment well    Behavior During Therapy  [facility] for tasks assessed/performed       Past Medical History:  Diagnosis Date  . Constipation due to opioid therapy 12/22/2015  . Emphysema lung ([place])   . Gait abnormality 02/20/2017  . GERD (gastroesophageal reflux disease)   . High cholesterol   . Hypertension   . OSA (obstructive sleep apnea)    on CPAP  . Primary localized osteoarthritis of right knee 12/08/2015  . Septic olecranon bursitis 06/2015   MSSA    Past Surgical History:  Procedure Laterality Date  . COLONOSCOPY W/ BIOPSIES AND POLYPECTOMY    . ELBOW SURGERY  08/2015   bilateral  . EYE SURGERY     'repair of tear to left eye from pliers'  . HERNIA REPAIR  07/2010  . KNEE SURGERY     3 times on both knee's each  . MULTIPLE TOOTH EXTRACTIONS    . SHOULDER ARTHROSCOPY    . TOTAL HIP ARTHROPLASTY  08/2010  . TOTAL KNEE ARTHROPLASTY Right 12/20/2015  . TOTAL KNEE ARTHROPLASTY Right 12/20/2015   Procedure: TOTAL KNEE ARTHROPLASTY;  Surgeon: [name], MD;  Location: [facility];  Service: Orthopedics;  Laterality: Right;    There were no vitals filed for this visit.  Subjective Assessment - 02/25/18 2113    Subjective   Pt reports vertigo today    Pertinent History  Pt with hx of trigger finger, left radial n. palsy, s/p C5-T2  fusion, foraminotomies at C5,C6, 7, T11, and laminectomies C5, C6, C7, and T1 on 12/18/17    Limitations  no bending, arching, and twisting, cervical collar at all times except for night time, showering and pt wears soift collar at night    Patient Stated Goals  to be able to play golf, to be able to write, and feed himself with LUE    Currently in Pain?  No/denies              Treatment: Custom trigger finger, MP block splint fabricated and fitted to patient. [place] 49mhz, 0.8w/cm 2, 20 % to palm and middle finger volar surface x 8 mins, to minimize triggering, no adverse reactions.             OT Education - 02/25/18 2121    Education provided  Yes    Education Details  trigger finger splint wear , care and prec. , education regarding firm composite grip x 6 weeks to minimize triggering.    Person(s) Educated  Patient    Methods  Explanation;Demonstration;Verbal cues;Handout    Comprehension  Verbalized understanding;Returned demonstration       OT Short Term Goals - 02/12/18 1258      OT SHORT TERM  GOAL #1   Title  I with LUE positioning including splinting prn and precautions in order to minimize triggering and to improve functional use.    Time  4    Period  Weeks    Status  New    Target Date  03/14/18      OT SHORT TERM GOAL #2   Title  I with intial HEP.    Time  4    Period  Weeks    Status  New      OT SHORT TERM GOAL #3   Title  Pt will verbalize understanding of adapted strategies to maximize safety and I with ADLs/IADLS.(fasten buttons, writing)    Time  4    Period  Weeks    Status  New      OT SHORT TERM GOAL #4   Title  Pt will demonstrate ability to write his name with 100% legibility using AE prn.    Time  4    Period  Weeks    Status  New      OT SHORT TERM GOAL #5   Title  Pt will demonstrate improved LUE fine motor coordination for ADLs  as evidenced by decreasing 9 hole peg test score to 90 secs or less.    Time  4    Period  Weeks     Status  New        OT Long Term Goals - 02/12/18 1308      OT LONG TERM GOAL #1   Title  Pt will resume use of LUE as his dominant hand at least 75% of the time for ADLS/ IADLs.    Time  12    Period  Weeks    Status  New    Target Date  05/13/18      OT LONG TERM GOAL #2   Title  Pt will demonstrate ability to retrieve a lightweight object from overhead shelf at 120 shoulder flexion with right and left arms indivdually.    Time  12    Period  Weeks    Status  New      OT LONG TERM GOAL #3   Title  Pt will demonstrate improved fine motor coordination in LUE as evidenced by perfroming 9 hole peg test in 75 secs or less.    Time  12    Period  Weeks    Status  New      OT LONG TERM GOAL #4   Title  pt will perfrom light home managment/ cooking at a modified independent level.    Time  12    Period  Weeks    Status  New      OT LONG TERM GOAL #5   Title  Pt will demonstrate ability to write a short paragraph with 100% legibility using AE prn.    Time  12    Period  Weeks    Status  New            Plan - 02/25/18 2119    Clinical Impression Statement  Pt was fitted with a new custom trigger finger splint for middle finger. Pt verbalizes understanding of wear and care.    Occupational Profile and client history currently impacting functional performance  LMB:EMLJ trigger fingers, left radial n. palsy, s/p C5-T2 fusion, foraminotomies at C5,C6, 7, T11, and laminectomies C5, C6, C7, and T1 on 12/18/17, lung nodules, emphysema, hyperlipidemia, arthritis Pt was completely I prior to his  surgery, he enjoyed playing golf  Pt currently requires assistance for home management/ cooking and he is unable to play golf due to weakness and precautions.    Occupational performance deficits (Please refer to evaluation for details):  IADL's;ADL's;Leisure;Play;Social Participation    Rehab Potential  Good    Current Impairments/barriers affecting progress:  sensory impairment, trigger  fingers,  back/ neck precautions    OT Frequency  2x / week    OT Duration  12 weeks    OT Treatment/Interventions  Self-care/ADL training;Therapeutic exercise;Visual/perceptual remediation/compensation;Patient/family education;Splinting;Neuromuscular education;Paraffin;Moist Heat;Fluidtherapy;Energy conservation;Functional Mobility Training;Balance training;Therapeutic activities;Cognitive remediation/compensation;Passive range of motion;Manual Therapy;DME and/or AE instruction;Contrast Bath;Ultrasound;Cryotherapy    Plan  splint check, estim, [place]    Consulted and Agree with Plan of Care  Patient       Patient will benefit from skilled therapeutic intervention in order to improve the following deficits and impairments:  Abnormal gait, Decreased knowledge of use of DME, Impaired flexibility, Pain, Decreased mobility, Decreased coordination, Decreased activity tolerance, Decreased endurance, Decreased range of motion, Decreased strength, Impaired UE functional use, Impaired perceived functional ability, Difficulty walking, Decreased safety awareness, Decreased knowledge of precautions, Decreased balance  Visit Diagnosis: Muscle weakness (generalized)  Stiffness of left hand, not elsewhere classified  Other disturbances of skin sensation    Problem List Patient Active Problem List   Diagnosis Date Noted  . Gait abnormality 02/20/2017  . Restless leg syndrome 07/05/2016  . Constipation due to opioid therapy 12/22/2015  . Primary localized osteoarthritis of right knee 12/08/2015  . Bullous emphysema ([place]) 11/02/2015  . Essential hypertension 11/02/2015  . Arthritis 11/02/2015  . Hyperlipidemia 11/02/2015  . OSA on CPAP 11/02/2015  . GERD (gastroesophageal reflux disease) 11/02/2015  . Septic olecranon bursitis 06/24/2015  . Multiple lung nodules on CT 06/30/2014    RINE,KATHRYN 02/25/2018, 9:23 PM  [facility] [address]  [place], [name], [address] Phone: [phone]   Fax:  [phone]  Name: Richard Lynch MRN: [id number] Date of Birth: 20-Jun-1951

## 2018-02-27 ENCOUNTER — Ambulatory Visit: Payer: Medicare Other | Admitting: Occupational Therapy

## 2018-02-27 DIAGNOSIS — H8111 Benign paroxysmal vertigo, right ear: Secondary | ICD-10-CM | POA: Diagnosis not present

## 2018-02-27 DIAGNOSIS — M6281 Muscle weakness (generalized): Secondary | ICD-10-CM

## 2018-02-27 DIAGNOSIS — M25642 Stiffness of left hand, not elsewhere classified: Secondary | ICD-10-CM | POA: Diagnosis not present

## 2018-02-27 DIAGNOSIS — R208 Other disturbances of skin sensation: Secondary | ICD-10-CM | POA: Diagnosis not present

## 2018-02-27 DIAGNOSIS — R2689 Other abnormalities of gait and mobility: Secondary | ICD-10-CM | POA: Diagnosis not present

## 2018-02-27 DIAGNOSIS — R2681 Unsteadiness on feet: Secondary | ICD-10-CM | POA: Diagnosis not present

## 2018-02-27 NOTE — Therapy (Signed)
[facility] [address] [place], [name], [address] Phone: [phone]   Fax:  [phone]  Occupational Therapy Treatment  Patient Details  Name: Richard Lynch MRN: [id number] Date of Birth: 1951/09/04 Referring Provider: Dr. Marykay Lex   Encounter Date: 02/27/2018  OT End of Session - 02/27/18 1154    Visit Number  6    Number of Visits  24    Date for OT Re-Evaluation  05/13/18    Authorization Type  Medicare    Authorization Time Period  90 days    OT Start Time  1100    OT Stop Time  1153    OT Time Calculation (min)  53 min    Activity Tolerance  Patient tolerated treatment well       Past Medical History:  Diagnosis Date  . Constipation due to opioid therapy 12/22/2015  . Emphysema lung ([place])   . Gait abnormality 02/20/2017  . GERD (gastroesophageal reflux disease)   . High cholesterol   . Hypertension   . OSA (obstructive sleep apnea)    on CPAP  . Primary localized osteoarthritis of right knee 12/08/2015  . Septic olecranon bursitis 06/2015   MSSA    Past Surgical History:  Procedure Laterality Date  . COLONOSCOPY W/ BIOPSIES AND POLYPECTOMY    . ELBOW SURGERY  08/2015   bilateral  . EYE SURGERY     'repair of tear to left eye from pliers'  . HERNIA REPAIR  07/2010  . KNEE SURGERY     3 times on both knee's each  . MULTIPLE TOOTH EXTRACTIONS    . SHOULDER ARTHROSCOPY    . TOTAL HIP ARTHROPLASTY  08/2010  . TOTAL KNEE ARTHROPLASTY Right 12/20/2015  . TOTAL KNEE ARTHROPLASTY Right 12/20/2015   Procedure: TOTAL KNEE ARTHROPLASTY;  Surgeon: [name], MD;  Location: [facility];  Service: Orthopedics;  Laterality: Right;    There were no vitals filed for this visit.  Subjective Assessment - 02/27/18 1109    Subjective   The new splint is doing good - no problems (pt arrived in new splint)     Pertinent History  Pt with hx of trigger finger, left radial n. palsy, s/p C5-T2 fusion, foraminotomies at  C5,C6, 7, T11, and laminectomies C5, C6, C7, and T1 on 12/18/17    Limitations  no bending, arching, and twisting, cervical collar at all times except for night time, showering and pt wears soift collar at night    Patient Stated Goals  to be able to play golf, to be able to write, and feed himself with LUE    Currently in Pain?  No/denies                   OT Treatments/Exercises (OP) - 02/27/18 0001      ADLs   ADL Comments  Reviewed proper way to perform ice massage vs. ice pack (while pt simultaneously receiving ultrasound)      Exercises   Exercises  Hand      Hand Exercises   Other Hand Exercises  Pt issued additional HEP (passive composite finger flexion, followed by active ext and passive long finger PIP ext) - see pt instructions for details. Pt demo each as indicated      Modalities   Modalities  Proofreader Location  dorsal forearm    Electrical Stimulation Action  wrist and finger extension  Electrical Stimulation Parameters  50 pps, 250 pw, 10 sec. on/off cycle x 15 min    Electrical Stimulation Goals  Neuromuscular facilitation      Ultrasound   Ultrasound Location  volar palm at base of long finger    Ultrasound Parameters  3 Mhz, 0.8 wts/cm2, 20% pulsed x 8 min.     Ultrasound Goals  Pain to minimize triggering, decrease inflammation             OT Education - 02/27/18 1145    Education provided  Yes    Education Details  Additional HEP    Person(s) Educated  Patient    Methods  Explanation;Demonstration;Handout    Comprehension  Verbalized understanding;Returned demonstration       OT Short Term Goals - 02/12/18 1258      OT SHORT TERM GOAL #1   Title  I with LUE positioning including splinting prn and precautions in order to minimize triggering and to improve functional use.    Time  4    Period  Weeks    Status  New    Target Date  03/14/18      OT SHORT  TERM GOAL #2   Title  I with intial HEP.    Time  4    Period  Weeks    Status  New      OT SHORT TERM GOAL #3   Title  Pt will verbalize understanding of adapted strategies to maximize safety and I with ADLs/IADLS.(fasten buttons, writing)    Time  4    Period  Weeks    Status  New      OT SHORT TERM GOAL #4   Title  Pt will demonstrate ability to write his name with 100% legibility using AE prn.    Time  4    Period  Weeks    Status  New      OT SHORT TERM GOAL #5   Title  Pt will demonstrate improved LUE fine motor coordination for ADLs  as evidenced by decreasing 9 hole peg test score to 90 secs or less.    Time  4    Period  Weeks    Status  New        OT Long Term Goals - 02/12/18 1308      OT LONG TERM GOAL #1   Title  Pt will resume use of LUE as his dominant hand at least 75% of the time for ADLS/ IADLs.    Time  12    Period  Weeks    Status  New    Target Date  05/13/18      OT LONG TERM GOAL #2   Title  Pt will demonstrate ability to retrieve a lightweight object from overhead shelf at 120 shoulder flexion with right and left arms indivdually.    Time  12    Period  Weeks    Status  New      OT LONG TERM GOAL #3   Title  Pt will demonstrate improved fine motor coordination in LUE as evidenced by perfroming 9 hole peg test in 75 secs or less.    Time  12    Period  Weeks    Status  New      OT LONG TERM GOAL #4   Title  pt will perfrom light home managment/ cooking at a modified independent level.    Time  12    Period  Weeks  Status  New      OT LONG TERM GOAL #5   Title  Pt will demonstrate ability to write a short paragraph with 100% legibility using AE prn.    Time  12    Period  Weeks    Status  New            Plan - 02/27/18 1155    Clinical Impression Statement  Pt responding well to new splint and modalities.     Occupational Profile and client history currently impacting functional performance  IWL:NLGX trigger fingers, left  radial n. palsy, s/p C5-T2 fusion, foraminotomies at C5,C6, 7, T11, and laminectomies C5, C6, C7, and T1 on 12/18/17, lung nodules, emphysema, hyperlipidemia, arthritis Pt was completely I prior to his surgery, he enjoyed playing golf  Pt currently requires assistance for home management/ cooking and he is unable to play golf due to weakness and precautions.    Occupational performance deficits (Please refer to evaluation for details):  IADL's;ADL's;Leisure;Play;Social Participation    Rehab Potential  Good    Current Impairments/barriers affecting progress:  sensory impairment, trigger fingers,  back/ neck precautions    OT Frequency  2x / week    OT Duration  12 weeks    OT Treatment/Interventions  Self-care/ADL training;Therapeutic exercise;Visual/perceptual remediation/compensation;Patient/family education;Splinting;Neuromuscular education;Paraffin;Moist Heat;Fluidtherapy;Energy conservation;Functional Mobility Training;Balance training;Therapeutic activities;Cognitive remediation/compensation;Passive range of motion;Manual Therapy;DME and/or AE instruction;Contrast Bath;Ultrasound;Cryotherapy    Plan  continue [place], estim for radial n. palsy, task modifications and potential A/E for trigger finger prn       Patient will benefit from skilled therapeutic intervention in order to improve the following deficits and impairments:  Abnormal gait, Decreased knowledge of use of DME, Impaired flexibility, Pain, Decreased mobility, Decreased coordination, Decreased activity tolerance, Decreased endurance, Decreased range of motion, Decreased strength, Impaired UE functional use, Impaired perceived functional ability, Difficulty walking, Decreased safety awareness, Decreased knowledge of precautions, Decreased balance  Visit Diagnosis: Stiffness of left hand, not elsewhere classified  Muscle weakness (generalized)    Problem List Patient Active Problem List   Diagnosis Date Noted  . Gait abnormality  02/20/2017  . Restless leg syndrome 07/05/2016  . Constipation due to opioid therapy 12/22/2015  . Primary localized osteoarthritis of right knee 12/08/2015  . Bullous emphysema ([place]) 11/02/2015  . Essential hypertension 11/02/2015  . Arthritis 11/02/2015  . Hyperlipidemia 11/02/2015  . OSA on CPAP 11/02/2015  . GERD (gastroesophageal reflux disease) 11/02/2015  . Septic olecranon bursitis 06/24/2015  . Multiple lung nodules on CT 06/30/2014    Carey Bullocks, OTR/L 02/27/2018, 11:58 AM  [facility] [address] [place], [name], [address] Phone: [phone]   Fax:  [phone]  Name: Braylee C Lynch MRN: [id number] Date of Birth: 1950/11/11

## 2018-02-27 NOTE — Patient Instructions (Signed)
SELF ASSISTED: Finger Flexion    Use right hand to close fingers of Lt hand. _5__ reps per set, then open Lt hand without help from other hand. Do 3 sessions per day   PIP Extension (Passive    Use thumb of other hand on top of middle joint and two fingers under- neath on either side to straighten middle joint of __long____ finger. Hold _20-30___ seconds. Repeat __5__ times. Do __3__ sessions per day.  Copyright  VHI. All rights reserved.

## 2018-03-01 ENCOUNTER — Ambulatory Visit: Payer: Medicare Other | Admitting: Physical Therapy

## 2018-03-01 DIAGNOSIS — R2681 Unsteadiness on feet: Secondary | ICD-10-CM | POA: Diagnosis not present

## 2018-03-01 DIAGNOSIS — M25642 Stiffness of left hand, not elsewhere classified: Secondary | ICD-10-CM | POA: Diagnosis not present

## 2018-03-01 DIAGNOSIS — M6281 Muscle weakness (generalized): Secondary | ICD-10-CM | POA: Diagnosis not present

## 2018-03-01 DIAGNOSIS — R2689 Other abnormalities of gait and mobility: Secondary | ICD-10-CM

## 2018-03-01 DIAGNOSIS — R208 Other disturbances of skin sensation: Secondary | ICD-10-CM | POA: Diagnosis not present

## 2018-03-01 DIAGNOSIS — H8111 Benign paroxysmal vertigo, right ear: Secondary | ICD-10-CM | POA: Diagnosis not present

## 2018-03-01 NOTE — Therapy (Signed)
[facility] [address] [place], [name], [address] Phone: [phone]   Fax:  [phone]  Physical Therapy Treatment  Patient Details  Name: Richard Lynch MRN: [id number] Date of Birth: 07-Sep-1951 Referring Provider: Dr. Marykay Lex   Encounter Date: 03/01/2018  PT End of Session - 03/01/18 1547    Visit Number  2    Number of Visits  17 eval + 2x/week x 8 weeks    Date for PT Re-Evaluation  04/26/18    Authorization Type  Medicare a/b    PT Start Time  1447    PT Stop Time  1530    PT Time Calculation (min)  43 min    Activity Tolerance  Patient tolerated treatment well    Behavior During Therapy  [facility] for tasks assessed/performed       Past Medical History:  Diagnosis Date  . Constipation due to opioid therapy 12/22/2015  . Emphysema lung ([place])   . Gait abnormality 02/20/2017  . GERD (gastroesophageal reflux disease)   . High cholesterol   . Hypertension   . OSA (obstructive sleep apnea)    on CPAP  . Primary localized osteoarthritis of right knee 12/08/2015  . Septic olecranon bursitis 06/2015   MSSA    Past Surgical History:  Procedure Laterality Date  . COLONOSCOPY W/ BIOPSIES AND POLYPECTOMY    . ELBOW SURGERY  08/2015   bilateral  . EYE SURGERY     'repair of tear to left eye from pliers'  . HERNIA REPAIR  07/2010  . KNEE SURGERY     3 times on both knee's each  . MULTIPLE TOOTH EXTRACTIONS    . SHOULDER ARTHROSCOPY    . TOTAL HIP ARTHROPLASTY  08/2010  . TOTAL KNEE ARTHROPLASTY Right 12/20/2015  . TOTAL KNEE ARTHROPLASTY Right 12/20/2015   Procedure: TOTAL KNEE ARTHROPLASTY;  Surgeon: [name], MD;  Location: [facility];  Service: Orthopedics;  Laterality: Right;    There were no vitals filed for this visit.  Subjective Assessment - 03/01/18 1551    Subjective  Pt reports ongoing dizziness with supine <> sit and rolling to R.  Decreased intensity but still present.    Pertinent History  s/p C5-T2  fusion, foraminotomies at C5,C6, 7, T11, and laminectomies C5, C6, C7, and T1 on 12/18/17, emphysema, sleep apnea; L4-L5 cyst per epic notes/chart review    Patient Stated Goals  Improve balance and improve dizziness "get rid of dizziness".    Currently in Pain?  No/denies         [facility] PT Assessment - 03/01/18 1507      Standardized Balance Assessment   Standardized Balance Assessment  10 meter walk test    10 Meter Walk  9.09 seconds or 3.34f/sec      Functional Gait  Assessment   Gait assessed   Yes    Gait Level Surface  Walks 20 ft in less than 5.5 sec, no assistive devices, good speed, no evidence for imbalance, normal gait pattern, deviates no more than 6 in outside of the 12 in walkway width.    Change in Gait Speed  Able to smoothly change walking speed without loss of balance or gait deviation. Deviate no more than 6 in outside of the 12 in walkway width.    Gait with Horizontal Head Turns  Performs head turns smoothly with slight change in gait velocity (eg, minor disruption to smooth gait path), deviates 6-10 in outside 12 in walkway width,  or uses an assistive device. looking with eyes, no head turn    Gait with Vertical Head Turns  Performs task with slight change in gait velocity (eg, minor disruption to smooth gait path), deviates 6 - 10 in outside 12 in walkway width or uses assistive device looking with eyes, no head turn    Gait and Pivot Turn  Pivot turns safely within 3 sec and stops quickly with no loss of balance.    Step Over Obstacle  Is able to step over one shoe box (4.5 in total height) without changing gait speed. No evidence of imbalance.    Gait with Narrow Base of Support  Ambulates 4-7 steps.    Gait with Eyes Closed  Walks 20 ft, slow speed, abnormal gait pattern, evidence for imbalance, deviates 10-15 in outside 12 in walkway width. Requires more than 9 sec to ambulate 20 ft.    Ambulating Backwards  Walks 20 ft, no assistive devices, good speed, no evidence  for imbalance, normal gait    Steps  Alternating feet, no rail.    Total Score  23    FGA comment:  23/30                    Vestibular Treatment/Exercise - 03/01/18 1523      Vestibular Treatment/Exercise   Vestibular Treatment Provided  Habituation    Habituation Exercises  Horizontal Roll      Horizontal Roll   Number of Reps   3    Symptom Description   no symptoms to L or R; only symptoms from supine <> sit         Balance Exercises - 03/01/18 1523      Balance Exercises: Standing   Tandem Gait  Forward;Upper extremity support;4 reps    Marching Limitations  in corner with EC, 2 > 1 finger touching back of chair.  4 sets x 10 reps marching       Access Code: [email or web address]  URL: [email or web address]  Date: 03/01/2018  Prepared by: Misty Stanley   Exercises  March in Place - 10 reps - 3 sets - 1x daily - 5x weekly  Walking Tandem Stance - 4 reps - 1x daily - 5x weekly     PT Education - 03/01/18 1546    Education provided  Yes    Education Details  balance during gait/falls risk and balance exercises added to HEP    Person(s) Educated  Patient    Methods  Explanation;Demonstration;Handout    Comprehension  Verbalized understanding;Returned demonstration       PT Short Term Goals - 03/01/18 1555      PT SHORT TERM GOAL #1   Title  The patient will be indep with modified habituation program.    Time  4    Period  Weeks    Target Date  03/27/18      PT SHORT TERM GOAL #2   Title  The patient will subjectively report no dizziness with bed mobility in the morning.    Time  4    Period  Weeks    Target Date  03/27/18      PT SHORT TERM GOAL #3   Title  Assess standing balance with Berg and FGA and write LTGs.    Baseline  need to assess BERG    Time  4    Period  Weeks    Status  Partially Met    Target Date  03/27/18  PT SHORT TERM GOAL #4   Title  Assess gait speed and write goal for LTGs.    Time  4    Period  Weeks     Status  Achieved        PT Long Term Goals - 03/01/18 1555      PT LONG TERM GOAL #1   Title  The patient will be indep with HEP for LE strengthening, balance, and gait activities.    Time  8    Period  Weeks    Target Date  04/26/18      PT LONG TERM GOAL #2   Title  Write LTG for [employer].    Time  8    Period  Weeks      PT LONG TERM GOAL #3   Title  Pt will improve gait velocity to >/= 3.8 ft/sec    Baseline  3.6 ft/sec    Time  8    Period  Weeks    Status  Revised    Target Date  04/26/18      PT LONG TERM GOAL #4   Title  Pt will improve FGA by 4 points to indicate decreased falls risk    Baseline  23/30    Time  8    Period  Weeks    Status  Revised    Target Date  04/26/18            Plan - 03/01/18 1547    Clinical Impression Statement  Pt continues to present with R rotary, upbeating nystagmus during sit > supine indicating ongoing R posterior canal canalithiasis.  Attempted to perform habituation with repeated rolling due to pt reporting symptoms at home with rolling R but today during repeated rolling pt denied any vertigo.  Advised pt to continue modified Brandt-Daroff until seen and cleared by surgeon for modified CRM.  Continued assessment of gait velocity and balance with FGA.  Initiated standing balance HEP in corner and at counter.  Will continue to address balance deficits to progress towards LTG.    Rehab Potential  Good    PT Frequency  2x / week + eval    PT Duration  8 weeks    PT Treatment/Interventions  ADLs/Self Care Home Management;Balance training;Neuromuscular re-education;Canalith Repostioning;Vestibular;Manual techniques;Patient/family education;Gait training;Functional mobility training;Therapeutic activities;Therapeutic exercise;Moist Heat    PT Next Visit Plan  Complete BERG, reset goal.  Add to balance HEP    Consulted and Agree with Plan of Care  Patient       Patient will benefit from skilled therapeutic intervention in order to  improve the following deficits and impairments:  Abnormal gait, Impaired sensation, Dizziness, Decreased activity tolerance, Decreased strength, Decreased balance, Postural dysfunction, Decreased range of motion  Visit Diagnosis: BPPV (benign paroxysmal positional vertigo), right  Unsteadiness on feet  Other abnormalities of gait and mobility     Problem List Patient Active Problem List   Diagnosis Date Noted  . Gait abnormality 02/20/2017  . Restless leg syndrome 07/05/2016  . Constipation due to opioid therapy 12/22/2015  . Primary localized osteoarthritis of right knee 12/08/2015  . Bullous emphysema ([place]) 11/02/2015  . Essential hypertension 11/02/2015  . Arthritis 11/02/2015  . Hyperlipidemia 11/02/2015  . OSA on CPAP 11/02/2015  . GERD (gastroesophageal reflux disease) 11/02/2015  . Septic olecranon bursitis 06/24/2015  . Multiple lung nodules on CT 06/30/2014    Rico Junker, PT, DPT 03/01/18    3:58 PM     [facility] [address] [place], [name], [address] Phone: [phone]   Fax:  [phone]  Name: Richard Lynch MRN: [id number] Date of Birth: Dec 29, 1950

## 2018-03-01 NOTE — Patient Instructions (Signed)
Access Code: [email or web address]  URL: [email or web address]  Date: 03/01/2018  Prepared by: Misty Stanley   Exercises  March in Place - 10 reps - 3 sets - 1x daily - 5x weekly  Walking Tandem Stance - 4 reps - 1x daily - 5x weekly

## 2018-03-04 ENCOUNTER — Ambulatory Visit: Payer: Medicare Other | Admitting: Occupational Therapy

## 2018-03-06 ENCOUNTER — Ambulatory Visit: Payer: Medicare Other | Admitting: Occupational Therapy

## 2018-03-06 DIAGNOSIS — R208 Other disturbances of skin sensation: Secondary | ICD-10-CM

## 2018-03-06 DIAGNOSIS — R2689 Other abnormalities of gait and mobility: Secondary | ICD-10-CM | POA: Diagnosis not present

## 2018-03-06 DIAGNOSIS — M6281 Muscle weakness (generalized): Secondary | ICD-10-CM

## 2018-03-06 DIAGNOSIS — M25642 Stiffness of left hand, not elsewhere classified: Secondary | ICD-10-CM | POA: Diagnosis not present

## 2018-03-06 DIAGNOSIS — R2681 Unsteadiness on feet: Secondary | ICD-10-CM | POA: Diagnosis not present

## 2018-03-06 DIAGNOSIS — H8111 Benign paroxysmal vertigo, right ear: Secondary | ICD-10-CM | POA: Diagnosis not present

## 2018-03-06 NOTE — Therapy (Signed)
[facility] [address] [place], [name], [address] Phone: [phone]   Fax:  [phone]  Occupational Therapy Treatment  Patient Details  Name: Richard Lynch MRN: [id number] Date of Birth: Mar 26, 1951 Referring Provider: Dr. Marykay Lex   Encounter Date: 03/06/2018  OT End of Session - 03/06/18 1213    Visit Number  7    Number of Visits  24    Date for OT Re-Evaluation  05/13/18    Authorization Type  Medicare    Authorization Time Period  90 days    OT Start Time  1020    OT Stop Time  1103    OT Time Calculation (min)  43 min    Activity Tolerance  Patient tolerated treatment well    Behavior During Therapy  [facility] for tasks assessed/performed       Past Medical History:  Diagnosis Date  . Constipation due to opioid therapy 12/22/2015  . Emphysema lung ([place])   . Gait abnormality 02/20/2017  . GERD (gastroesophageal reflux disease)   . High cholesterol   . Hypertension   . OSA (obstructive sleep apnea)    on CPAP  . Primary localized osteoarthritis of right knee 12/08/2015  . Septic olecranon bursitis 06/2015   MSSA    Past Surgical History:  Procedure Laterality Date  . COLONOSCOPY W/ BIOPSIES AND POLYPECTOMY    . ELBOW SURGERY  08/2015   bilateral  . EYE SURGERY     'repair of tear to left eye from pliers'  . HERNIA REPAIR  07/2010  . KNEE SURGERY     3 times on both knee's each  . MULTIPLE TOOTH EXTRACTIONS    . SHOULDER ARTHROSCOPY    . TOTAL HIP ARTHROPLASTY  08/2010  . TOTAL KNEE ARTHROPLASTY Right 12/20/2015  . TOTAL KNEE ARTHROPLASTY Right 12/20/2015   Procedure: TOTAL KNEE ARTHROPLASTY;  Surgeon: [name], MD;  Location: [facility];  Service: Orthopedics;  Laterality: Right;    There were no vitals filed for this visit.  Subjective Assessment - 03/06/18 1021    Pertinent History  Pt with hx of trigger finger, left radial n. palsy, s/p C5-T2 fusion, foraminotomies at C5,C6, 7, T11, and  laminectomies C5, C6, C7, and T1 on 12/18/17    Limitations  no bending, arching, and twisting, cervical collar at all times except for night time, showering and pt wears soift collar at night    Patient Stated Goals  to be able to play golf, to be able to write, and feed himself with LUE    Currently in Pain?  No/denies              Treatment: Pt arrived today without his trigger finger splint, pt was instructed to wear splint on middle finger except for bathing and nighttime as he is wearing resting hand splint at night. [place] 64mhz, 0.8w/cm 2, 20% x 8 mins to left palm and digits, no adverse reactions. Reviewed PIP flexion with MP blocked in extension, A/ROM wrist flexion and extension.  Pt practiced writing and he was able to wrist several sentences legibly with foam grip.  NMES 50 pps, 250 pw, 10 secs cycle intensity 50 to wrist and finger extensors, x 15 mins AA/ROM wrist and finger ext. Performed during on cycle.No adverse reactions               OT Short Term Goals - 03/06/18 1214      OT SHORT TERM GOAL #1  Title  I with LUE positioning including splinting prn and precautions in order to minimize triggering and to improve functional use.    Time  4    Period  Weeks    Status  On-going      OT SHORT TERM GOAL #2   Title  I with intial HEP.    Time  4    Period  Weeks    Status  Achieved      OT SHORT TERM GOAL #3   Title  Pt will verbalize understanding of adapted strategies to maximize safety and I with ADLs/IADLS.(fasten buttons, writing)    Time  4    Period  Weeks    Status  On-going      OT SHORT TERM GOAL #4   Title  Pt will demonstrate ability to write his name with 100% legibility using AE prn.    Time  4    Period  Weeks    Status  Achieved      OT SHORT TERM GOAL #5   Title  Pt will demonstrate improved LUE fine motor coordination for ADLs  as evidenced by decreasing 9 hole peg test score to 90 secs or less.    Time  4    Period  Weeks     Status  On-going        OT Long Term Goals - 02/12/18 1308      OT LONG TERM GOAL #1   Title  Pt will resume use of LUE as his dominant hand at least 75% of the time for ADLS/ IADLs.    Time  12    Period  Weeks    Status  New    Target Date  05/13/18      OT LONG TERM GOAL #2   Title  Pt will demonstrate ability to retrieve a lightweight object from overhead shelf at 120 shoulder flexion with right and left arms indivdually.    Time  12    Period  Weeks    Status  New      OT LONG TERM GOAL #3   Title  Pt will demonstrate improved fine motor coordination in LUE as evidenced by perfroming 9 hole peg test in 75 secs or less.    Time  12    Period  Weeks    Status  New      OT LONG TERM GOAL #4   Title  pt will perfrom light home managment/ cooking at a modified independent level.    Time  12    Period  Weeks    Status  New      OT LONG TERM GOAL #5   Title  Pt will demonstrate ability to write a short paragraph with 100% legibility using AE prn.    Time  12    Period  Weeks    Status  New            Plan - 03/06/18 1213    Clinical Impression Statement  Pt is progressing slowly towards goals. Plan to drop frequency to 1x week due at this time. Pt in agreement.    Occupational Profile and client history currently impacting functional performance  ATF:TDDU trigger fingers, left radial n. palsy, s/p C5-T2 fusion, foraminotomies at C5,C6, 7, T11, and laminectomies C5, C6, C7, and T1 on 12/18/17, lung nodules, emphysema, hyperlipidemia, arthritis Pt was completely I prior to his surgery, he enjoyed playing golf  Pt currently requires assistance for  home management/ cooking and he is unable to play golf due to weakness and precautions.    Occupational performance deficits (Please refer to evaluation for details):  IADL's;ADL's;Leisure;Play;Social Participation    Rehab Potential  Good    Current Impairments/barriers affecting progress:  sensory impairment, trigger fingers,   back/ neck precautions    OT Frequency  1x / week    OT Duration  12 weeks    OT Treatment/Interventions  Self-care/ADL training;Therapeutic exercise;Visual/perceptual remediation/compensation;Patient/family education;Splinting;Neuromuscular education;Paraffin;Moist Heat;Fluidtherapy;Energy conservation;Functional Mobility Training;Balance training;Therapeutic activities;Cognitive remediation/compensation;Passive range of motion;Manual Therapy;DME and/or AE instruction;Contrast Bath;Ultrasound;Cryotherapy    Plan  check STG's, continue [place], estim for radial n. palsy, task modifications and potential A/E for trigger finger prn    Clinical Decision Making  Limited treatment options, no task modification necessary    Consulted and Agree with Plan of Care  Patient       Patient will benefit from skilled therapeutic intervention in order to improve the following deficits and impairments:  Abnormal gait, Decreased knowledge of use of DME, Impaired flexibility, Pain, Decreased mobility, Decreased coordination, Decreased activity tolerance, Decreased endurance, Decreased range of motion, Decreased strength, Impaired UE functional use, Impaired perceived functional ability, Difficulty walking, Decreased safety awareness, Decreased knowledge of precautions, Decreased balance  Visit Diagnosis: Stiffness of left hand, not elsewhere classified  Muscle weakness (generalized)  Other disturbances of skin sensation    Problem List Patient Active Problem List   Diagnosis Date Noted  . Gait abnormality 02/20/2017  . Restless leg syndrome 07/05/2016  . Constipation due to opioid therapy 12/22/2015  . Primary localized osteoarthritis of right knee 12/08/2015  . Bullous emphysema ([place]) 11/02/2015  . Essential hypertension 11/02/2015  . Arthritis 11/02/2015  . Hyperlipidemia 11/02/2015  . OSA on CPAP 11/02/2015  . GERD (gastroesophageal reflux disease) 11/02/2015  . Septic olecranon bursitis 06/24/2015  .  Multiple lung nodules on CT 06/30/2014    Irvin Bastin 03/06/2018, 12:15 PM  [facility] [address] [place], [name], [address] Phone: [phone]   Fax:  [phone]  Name: Vittorio C Lynch MRN: [id number] Date of Birth: 1951-03-02

## 2018-03-08 ENCOUNTER — Ambulatory Visit: Payer: Medicare Other | Admitting: Physical Therapy

## 2018-03-11 DIAGNOSIS — H2513 Age-related nuclear cataract, bilateral: Secondary | ICD-10-CM | POA: Diagnosis not present

## 2018-03-11 DIAGNOSIS — H02834 Dermatochalasis of left upper eyelid: Secondary | ICD-10-CM | POA: Diagnosis not present

## 2018-03-11 DIAGNOSIS — H02403 Unspecified ptosis of bilateral eyelids: Secondary | ICD-10-CM | POA: Diagnosis not present

## 2018-03-11 DIAGNOSIS — H01024 Squamous blepharitis left upper eyelid: Secondary | ICD-10-CM | POA: Diagnosis not present

## 2018-03-11 DIAGNOSIS — H01025 Squamous blepharitis left lower eyelid: Secondary | ICD-10-CM | POA: Diagnosis not present

## 2018-03-11 DIAGNOSIS — H01021 Squamous blepharitis right upper eyelid: Secondary | ICD-10-CM | POA: Diagnosis not present

## 2018-03-11 DIAGNOSIS — H01022 Squamous blepharitis right lower eyelid: Secondary | ICD-10-CM | POA: Diagnosis not present

## 2018-03-11 DIAGNOSIS — H02831 Dermatochalasis of right upper eyelid: Secondary | ICD-10-CM | POA: Diagnosis not present

## 2018-03-11 DIAGNOSIS — H47012 Ischemic optic neuropathy, left eye: Secondary | ICD-10-CM | POA: Diagnosis not present

## 2018-03-12 ENCOUNTER — Encounter: Payer: Medicare Other | Admitting: Occupational Therapy

## 2018-03-14 ENCOUNTER — Encounter: Payer: Self-pay | Admitting: *Deleted

## 2018-03-14 ENCOUNTER — Ambulatory Visit: Payer: Medicare Other | Admitting: Physical Therapy

## 2018-03-14 ENCOUNTER — Encounter: Payer: Self-pay | Admitting: Physical Therapy

## 2018-03-14 ENCOUNTER — Ambulatory Visit: Payer: Medicare Other | Admitting: *Deleted

## 2018-03-14 DIAGNOSIS — R2681 Unsteadiness on feet: Secondary | ICD-10-CM | POA: Diagnosis not present

## 2018-03-14 DIAGNOSIS — H8111 Benign paroxysmal vertigo, right ear: Secondary | ICD-10-CM | POA: Diagnosis not present

## 2018-03-14 DIAGNOSIS — R208 Other disturbances of skin sensation: Secondary | ICD-10-CM

## 2018-03-14 DIAGNOSIS — M6281 Muscle weakness (generalized): Secondary | ICD-10-CM

## 2018-03-14 DIAGNOSIS — M25642 Stiffness of left hand, not elsewhere classified: Secondary | ICD-10-CM | POA: Diagnosis not present

## 2018-03-14 DIAGNOSIS — R2689 Other abnormalities of gait and mobility: Secondary | ICD-10-CM

## 2018-03-14 DIAGNOSIS — G4733 Obstructive sleep apnea (adult) (pediatric): Secondary | ICD-10-CM | POA: Diagnosis not present

## 2018-03-14 NOTE — Patient Instructions (Addendum)
Access Code: [email or web address]  URL: [email or web address]  Date: 03/14/2018  Prepared by: Misty Stanley   Exercises  March in Place - 10 reps - 3 sets - 1x daily - 5x weekly  Walking Tandem Stance - 4 reps - 1x daily - 5x weekly  Standing Single Leg Stance with Unilateral Counter Support - 3 sets - 12 second hold - 1x daily - 5x weekly  Standing Hip Abduction - 12 reps - 2 sets - 1x daily - 5x weekly  Heel Toe Raises with Unilateral Counter Support - 12 reps - 2 sets - 1x daily - 5x weekly

## 2018-03-14 NOTE — Therapy (Signed)
[facility] [address] [place], [name], [address] Phone: [phone]   Fax:  [phone]  Physical Therapy Treatment  Patient Details  Name: Richard Lynch MRN: [id number] Date of Birth: [birth year] Referring Provider: Dr. Marykay Lex   Encounter Date: 03/14/2018  PT End of Session - 03/14/18 1249    Visit Number  3    Number of Visits  17 eval + 2x/week x 8 weeks    Date for PT Re-Evaluation  04/26/18    Authorization Type  Medicare a/b    PT Start Time  0810 arrived late    PT Stop Time  0848    PT Time Calculation (min)  38 min    Activity Tolerance  Patient tolerated treatment well    Behavior During Therapy  [facility] for tasks assessed/performed       Past Medical History:  Diagnosis Date  . Constipation due to opioid therapy 12/22/2015  . Emphysema lung ([place])   . Gait abnormality 02/20/2017  . GERD (gastroesophageal reflux disease)   . High cholesterol   . Hypertension   . OSA (obstructive sleep apnea)    on CPAP  . Primary localized osteoarthritis of right knee 12/08/2015  . Septic olecranon bursitis 06/2015   MSSA    Past Surgical History:  Procedure Laterality Date  . COLONOSCOPY W/ BIOPSIES AND POLYPECTOMY    . ELBOW SURGERY  08/2015   bilateral  . EYE SURGERY     'repair of tear to left eye from pliers'  . HERNIA REPAIR  07/2010  . KNEE SURGERY     3 times on both knee's each  . MULTIPLE TOOTH EXTRACTIONS    . SHOULDER ARTHROSCOPY    . TOTAL HIP ARTHROPLASTY  08/2010  . TOTAL KNEE ARTHROPLASTY Right 12/20/2015  . TOTAL KNEE ARTHROPLASTY Right 12/20/2015   Procedure: TOTAL KNEE ARTHROPLASTY;  Surgeon: [name], MD;  Location: [facility];  Service: Orthopedics;  Laterality: Right;    There were no vitals filed for this visit.  Subjective Assessment - 03/14/18 0817    Subjective  C-collar was discharged but PA is maintaining lifting restrictions and no twisting, would like him to wear the c-collar if  driving or walking long distances.  Has little to no vertigo now.      Pertinent History  s/p C5-T2 fusion, foraminotomies at C5,C6, 7, T11, and laminectomies C5, C6, C7, and T1 on 12/18/17, emphysema, sleep apnea; L4-L5 cyst per epic notes/chart review    Patient Stated Goals  Improve balance and improve dizziness "get rid of dizziness".    Currently in Pain?  No/denies soreness in shoulder area         [facility] PT Assessment - 03/14/18 0820      Standardized Balance Assessment   Standardized Balance Assessment  Berg Balance Test      Berg Balance Test   Sit to Stand  Able to stand without using hands and stabilize independently    Standing Unsupported  Able to stand safely 2 minutes    Sitting with Back Unsupported but Feet Supported on Floor or Stool  Able to sit safely and securely 2 minutes    Stand to Sit  Sits safely with minimal use of hands    Transfers  Able to transfer safely, minor use of hands    Standing Unsupported with Eyes Closed  Able to stand 10 seconds safely    Standing Ubsupported with Feet Together  Able to place feet together  independently and stand for 1 minute with supervision    From Standing, Reach Forward with Outstretched Arm  Can reach confidently >25 cm (10")    From Standing Position, Pick up Object from [place] to pick up shoe, needs supervision    From Standing Position, Turn to Look Behind Over each Shoulder  Turn sideways only but maintains balance    Turn 360 Degrees  Able to turn 360 degrees safely one side only in 4 seconds or less    Standing Unsupported, Alternately Place Feet on Step/Stool  Able to complete 4 steps without aid or supervision    Standing Unsupported, One Foot in [place] to plae foot ahead of the other independently and hold 30 seconds    Standing on One Leg  Tries to lift leg/unable to hold 3 seconds but remains standing independently    Total Score  45    Berg comment:  45/56       Reviewed the following exercises for  HEP, holding counter for support:   Access Code: [email or web address]  URL: [email or web address]  Date: 03/14/2018  Prepared by: Misty Stanley   Exercises  March in Place - 10 reps - 3 sets - 1x daily - 5x weekly  Walking Tandem Stance - 4 reps - 1x daily - 5x weekly  Standing Single Leg Stance with Unilateral Counter Support - 3 sets - 12 second hold - 1x daily - 5x weekly  Standing Hip Abduction - 12 reps - 2 sets - 1x daily - 5x weekly  Heel Toe Raises with Unilateral Counter Support - 12 reps - 2 sets - 1x daily - 5x weekly     Vestibular Treatment/Exercise - 03/14/18 0820      Vestibular Treatment/Exercise   Vestibular Treatment Provided  Habituation    Habituation Exercises  Legrand Como Daroff   Number of Reps   1    Symptom Description   1 rep to each side modified, lying back at an angle but no trunk or neck rotation.  No symptoms reported         PT Education - 03/14/18 1247    Education provided  Yes    Education Details  exercises added to balance HEP, will continue to hold on CRM due to no twisting precaution    Person(s) Educated  Patient    Methods  Explanation;Demonstration;Handout    Comprehension  Verbalized understanding;Returned demonstration       PT Short Term Goals - 03/14/18 1300      PT SHORT TERM GOAL #1   Title  The patient will be indep with modified habituation program.    Time  4    Period  Weeks    Status  Achieved      PT SHORT TERM GOAL #2   Title  The patient will subjectively report no dizziness with bed mobility in the morning.    Time  4    Period  Weeks    Status  On-going    Target Date  03/27/18      PT SHORT TERM GOAL #3   Title  Assess standing balance with Berg and FGA and write LTGs.    Time  4    Period  Weeks    Status  Achieved      PT SHORT TERM GOAL #4   Title  Assess gait speed and write goal for LTGs.    Time  4  Period  Weeks    Status  Achieved        PT Long Term Goals -  03/14/18 1301      PT LONG TERM GOAL #1   Title  The patient will be indep with HEP for LE strengthening, balance, and gait activities.    Time  8    Period  Weeks    Target Date  04/26/18      PT LONG TERM GOAL #2   Title  Pt will improve BERG balance by 8 points    Baseline  45/56    Time  8    Period  Weeks    Status  Revised    Target Date  04/26/18      PT LONG TERM GOAL #3   Title  Pt will improve gait velocity to >/= 3.8 ft/sec    Baseline  3.6 ft/sec    Time  8    Period  Weeks    Status  Revised    Target Date  04/26/18      PT LONG TERM GOAL #4   Title  Pt will improve FGA by 4 points to indicate decreased falls risk    Baseline  23/30    Time  8    Period  Weeks    Status  Revised    Target Date  04/26/18            Plan - 03/14/18 1249    Clinical Impression Statement  Pt's c-collar has D/C but due to continued restrictions for rotation will continue to hold on CRM for treatment of BPPV until restrictions lifted.  Pt encouraged to continue modified Brandt-Daroff.  Continued to perform assessment of balance with BERG with pt demonstrating moderate-significant risk for falling.  Rest of session focused on adding standing balance and strengthening exercises for pt to perform at home.  Will continue to progress towards LTG.    Rehab Potential  Good    PT Frequency  2x / week + eval    PT Duration  8 weeks    PT Treatment/Interventions  ADLs/Self Care Home Management;Balance training;Neuromuscular re-education;Canalith Repostioning;Vestibular;Manual techniques;Patient/family education;Gait training;Functional mobility training;Therapeutic activities;Therapeutic exercise;Moist Heat    PT Next Visit Plan  C-collar is D/C but PA states pt continues to have lifting and twisting restrictions.  Continue to focus on balance, UE strength, endurance.      Consulted and Agree with Plan of Care  Patient       Patient will benefit from skilled therapeutic intervention in  order to improve the following deficits and impairments:  Abnormal gait, Impaired sensation, Dizziness, Decreased activity tolerance, Decreased strength, Decreased balance, Postural dysfunction, Decreased range of motion  Visit Diagnosis: Other abnormalities of gait and mobility  Unsteadiness on feet  BPPV (benign paroxysmal positional vertigo), right  Other disturbances of skin sensation  Muscle weakness (generalized)     Problem List Patient Active Problem List   Diagnosis Date Noted  . Gait abnormality 02/20/2017  . Restless leg syndrome 07/05/2016  . Constipation due to opioid therapy 12/22/2015  . Primary localized osteoarthritis of right knee 12/08/2015  . Bullous emphysema ([place]) 11/02/2015  . Essential hypertension 11/02/2015  . Arthritis 11/02/2015  . Hyperlipidemia 11/02/2015  . OSA on CPAP 11/02/2015  . GERD (gastroesophageal reflux disease) 11/02/2015  . Septic olecranon bursitis 06/24/2015  . Multiple lung nodules on CT 06/30/2014   Rico Junker, PT, DPT 03/14/18    1:03 PM    [facility] [address] [place], [name], [address] Phone: [phone]   Fax:  [phone]  Name: Chas C Lynch MRN: [id number] Date of Birth: Dec 29, 1950

## 2018-03-14 NOTE — Therapy (Signed)
[facility] [address] [place], [name], [address] Phone: [phone]   Fax:  [phone]  Occupational Therapy Treatment  Patient Details  Name: Richard Lynch MRN: [id number] Date of Birth: Apr 27, 1951 Referring Provider: Dr. Marykay Lex   Encounter Date: 03/14/2018  OT End of Session - 03/14/18 1307    Visit Number  8    Number of Visits  24    Date for OT Re-Evaluation  05/13/18    Authorization Type  Medicare    Authorization Time Period  90 days    OT Start Time  0931    OT Stop Time  1020    OT Time Calculation (min)  49 min    Activity Tolerance  Patient tolerated treatment well    Behavior During Therapy  [facility] for tasks assessed/performed       Past Medical History:  Diagnosis Date  . Constipation due to opioid therapy 12/22/2015  . Emphysema lung ([place])   . Gait abnormality 02/20/2017  . GERD (gastroesophageal reflux disease)   . High cholesterol   . Hypertension   . OSA (obstructive sleep apnea)    on CPAP  . Primary localized osteoarthritis of right knee 12/08/2015  . Septic olecranon bursitis 06/2015   MSSA    Past Surgical History:  Procedure Laterality Date  . COLONOSCOPY W/ BIOPSIES AND POLYPECTOMY    . ELBOW SURGERY  08/2015   bilateral  . EYE SURGERY     'repair of tear to left eye from pliers'  . HERNIA REPAIR  07/2010  . KNEE SURGERY     3 times on both knee's each  . MULTIPLE TOOTH EXTRACTIONS    . SHOULDER ARTHROSCOPY    . TOTAL HIP ARTHROPLASTY  08/2010  . TOTAL KNEE ARTHROPLASTY Right 12/20/2015  . TOTAL KNEE ARTHROPLASTY Right 12/20/2015   Procedure: TOTAL KNEE ARTHROPLASTY;  Surgeon: [name], MD;  Location: [facility];  Service: Orthopedics;  Laterality: Right;    There were no vitals filed for this visit.  Subjective Assessment - 03/14/18 0934    Subjective   Pt denies pain. "I feel real good" Pt states tha the had verbal dicsussion w/ MD via telephone call "last week" and states  that he no longer needs cervical collar at all times and can "begin doing small weights for my wrist, hand and curls. I am doing them at home". No written orders were obtained     Pertinent History  Pt with hx of trigger finger, left radial n. palsy, s/p C5-T2 fusion, foraminotomies at C5,C6, 7, T11, and laminectomies C5, C6, C7, and T1 on 12/18/17    Limitations  no bending, arching, and twisting, cervical collar at all times except for night time, showering and pt wears soift collar at night    Patient Stated Goals  to be able to play golf, to be able to write, and feed himself with LUE    Currently in Pain?  No/denies    Multiple Pain Sites  No         [facility] OT Assessment - 03/14/18 0001      Coordination   Left 9 Hole Peg Test  90.58 sec               OT Treatments/Exercises (OP) - 03/14/18 0001      ADLs   ADL Comments  Discussed a/e and addressed STG #3      Exercises   Exercises  Hand Reviewed, performed & stressed  importance of HEP      Hand Exercises   Other Hand Exercises  Discussed possible A/E for increased independence with ADL's and functional activity and reviewed STG's. Pt should benefit from button hook use and plans to purchase one for home use      Modalities   Modalities  Electrical Stimulation;Ultrasound      Electrical Stimulation   Electrical Stimulation Location  dorsal forearm    Electrical Stimulation Action  Wrist and finger extension    Electrical Stimulation Parameters  50 pps, 250 pw, 10 sec on/off cycle x15 min    Electrical Stimulation Goals  Neuromuscular facilitation      Ultrasound   Ultrasound Location  Volar palm at base of LF    Ultrasound Parameters  31mz, 0.8 w/cm2, 20 % pulsed x8 min    Ultrasound Goals  Pain To minimize triggering and decrease inflamation      Vestibular Treatment/Exercise - 03/14/18 0820      Vestibular Treatment/Exercise   Vestibular Treatment Provided  Habituation    Habituation Exercises  BLegrand Lynch   Number of Reps   1    Symptom Description   1 rep to each side modified, lying back at an angle but no trunk or neck rotation.  No symptoms reported            OT Education - 03/14/18 1307    Education provided  Yes    Education Details  Importance of performing HEP, a/e for increased indeependence    Person(s) Educated  Patient    Methods  Explanation;Demonstration;Verbal cues    Comprehension  Verbalized understanding;Returned demonstration       OT Short Term Goals - 03/14/18 1311      OT SHORT TERM GOAL #1   Title  I with LUE positioning including splinting prn and precautions in order to minimize triggering and to improve functional use.    Time  4    Period  Weeks    Status  Achieved      OT SHORT TERM GOAL #2   Title  I with intial HEP.    Time  4    Period  Weeks    Status  Achieved      OT SHORT TERM GOAL #3   Title  Pt will verbalize understanding of adapted strategies to maximize safety and I with ADLs/IADLS.(fasten buttons, writing)    Time  4    Period  Weeks    Status  Achieved      OT SHORT TERM GOAL #4   Title  Pt will demonstrate ability to write his name with 100% legibility using AE prn.    Time  4    Period  Weeks    Status  Achieved      OT SHORT TERM GOAL #5   Title  Pt will demonstrate improved LUE fine motor coordination for ADLs  as evidenced by decreasing 9 hole peg test score to 90 secs or less.    Baseline  03/14/18 90.58 sec    Time  4    Period  Weeks    Status  On-going        OT Long Term Goals - 02/12/18 1308      OT LONG TERM GOAL #1   Title  Pt will resume use of LUE as his dominant hand at least 75% of the time for ADLS/ IADLs.    Time  12  Period  Weeks    Status  New    Target Date  05/13/18      OT LONG TERM GOAL #2   Title  Pt will demonstrate ability to retrieve a lightweight object from overhead shelf at 120 shoulder flexion with right and left arms indivdually.    Time  12     Period  Weeks    Status  New      OT LONG TERM GOAL #3   Title  Pt will demonstrate improved fine motor coordination in LUE as evidenced by perfroming 9 hole peg test in 75 secs or less.    Time  12    Period  Weeks    Status  New      OT LONG TERM GOAL #4   Title  pt will perfrom light home managment/ cooking at a modified independent level.    Time  12    Period  Weeks    Status  New      OT LONG TERM GOAL #5   Title  Pt will demonstrate ability to write a short paragraph with 100% legibility using AE prn.    Time  12    Period  Weeks    Status  New            Plan - 03/14/18 1308    Clinical Impression Statement  Pt met STG #1, 3 and is progressing toward #5. Stressed importance of performing HEP and splinting use to assist in recovery as pt expresses frustration with "how long it is taking".    Occupational Profile and client history currently impacting functional performance  KGY:JEHU trigger fingers, left radial n. palsy, s/p C5-T2 fusion, foraminotomies at C5,C6, 7, T11, and laminectomies C5, C6, C7, and T1 on 12/18/17, lung nodules, emphysema, hyperlipidemia, arthritis Pt was completely I prior to his surgery, he enjoyed playing golf  Pt currently requires assistance for home management/ cooking and he is unable to play golf due to weakness and precautions.    Occupational performance deficits (Please refer to evaluation for details):  IADL's;ADL's;Leisure;Play;Social Participation    Rehab Potential  Good    Current Impairments/barriers affecting progress:  sensory impairment, trigger fingers,  back/ neck precautions    OT Frequency  1x / week    OT Duration  12 weeks    OT Treatment/Interventions  Self-care/ADL training;Therapeutic exercise;Visual/perceptual remediation/compensation;Patient/family education;Splinting;Neuromuscular education;Paraffin;Moist Heat;Fluidtherapy;Energy conservation;Functional Mobility Training;Balance training;Therapeutic activities;Cognitive  remediation/compensation;Passive range of motion;Manual Therapy;DME and/or AE instruction;Contrast Bath;Ultrasound;Cryotherapy    Plan  Continue E-stim for Radial N Palsy, focus on LTG's.    Clinical Decision Making  Limited treatment options, no task modification necessary    Consulted and Agree with Plan of Care  Patient       Patient will benefit from skilled therapeutic intervention in order to improve the following deficits and impairments:  Abnormal gait, Decreased knowledge of use of DME, Impaired flexibility, Pain, Decreased mobility, Decreased coordination, Decreased activity tolerance, Decreased endurance, Decreased range of motion, Decreased strength, Impaired UE functional use, Impaired perceived functional ability, Difficulty walking, Decreased safety awareness, Decreased knowledge of precautions, Decreased balance  Visit Diagnosis: Stiffness of left hand, not elsewhere classified  Muscle weakness (generalized)  Other disturbances of skin sensation    Problem List Patient Active Problem List   Diagnosis Date Noted  . Gait abnormality 02/20/2017  . Restless leg syndrome 07/05/2016  . Constipation due to opioid therapy 12/22/2015  . Primary localized osteoarthritis of right knee 12/08/2015  .  Bullous emphysema ([place]) 11/02/2015  . Essential hypertension 11/02/2015  . Arthritis 11/02/2015  . Hyperlipidemia 11/02/2015  . OSA on CPAP 11/02/2015  . GERD (gastroesophageal reflux disease) 11/02/2015  . Septic olecranon bursitis 06/24/2015  . Multiple lung nodules on CT 06/30/2014    Percell Miller Beth Dixon, OTR/L 03/14/2018, 1:20 PM  [facility] [address] [place], [name], [address] Phone: [phone]   Fax:  [phone]  Name: Richard Lynch MRN: [id number] Date of Birth: 1951/04/08

## 2018-03-15 ENCOUNTER — Encounter: Payer: Medicare Other | Admitting: Rehabilitative and Restorative Service Providers"

## 2018-03-15 ENCOUNTER — Encounter

## 2018-03-21 ENCOUNTER — Encounter: Payer: Medicare Other | Admitting: Occupational Therapy

## 2018-03-21 ENCOUNTER — Ambulatory Visit: Payer: Medicare Other | Admitting: Rehabilitative and Restorative Service Providers"

## 2018-03-22 ENCOUNTER — Encounter: Payer: Self-pay | Admitting: Rehabilitative and Restorative Service Providers"

## 2018-03-22 ENCOUNTER — Ambulatory Visit: Payer: Medicare Other | Admitting: Occupational Therapy

## 2018-03-22 ENCOUNTER — Ambulatory Visit: Payer: Medicare Other | Admitting: Rehabilitative and Restorative Service Providers"

## 2018-03-22 DIAGNOSIS — R208 Other disturbances of skin sensation: Principal | ICD-10-CM

## 2018-03-22 DIAGNOSIS — M25642 Stiffness of left hand, not elsewhere classified: Secondary | ICD-10-CM

## 2018-03-22 DIAGNOSIS — M6281 Muscle weakness (generalized): Secondary | ICD-10-CM | POA: Diagnosis not present

## 2018-03-22 DIAGNOSIS — H8111 Benign paroxysmal vertigo, right ear: Secondary | ICD-10-CM

## 2018-03-22 DIAGNOSIS — R2681 Unsteadiness on feet: Secondary | ICD-10-CM

## 2018-03-22 DIAGNOSIS — R2689 Other abnormalities of gait and mobility: Secondary | ICD-10-CM | POA: Diagnosis not present

## 2018-03-22 NOTE — Therapy (Signed)
[facility] [address] [place], [name], [address] Phone: [phone]   Fax:  [phone]  Occupational Therapy Treatment  Patient Details  Name: Richard Lynch MRN: [id number] Date of Birth: [birth year] Referring Provider: Dr. Marykay Lex   Encounter Date: 03/22/2018  OT End of Session - 03/22/18 1549    Visit Number  9    Number of Visits  24    Date for OT Re-Evaluation  05/13/18    Authorization Type  Medicare    Authorization Time Period  90 days    OT Start Time  1152    OT Stop Time  1232    OT Time Calculation (min)  40 min       Past Medical History:  Diagnosis Date  . Constipation due to opioid therapy 12/22/2015  . Emphysema lung ([place])   . Gait abnormality 02/20/2017  . GERD (gastroesophageal reflux disease)   . High cholesterol   . Hypertension   . OSA (obstructive sleep apnea)    on CPAP  . Primary localized osteoarthritis of right knee 12/08/2015  . Septic olecranon bursitis 06/2015   MSSA    Past Surgical History:  Procedure Laterality Date  . COLONOSCOPY W/ BIOPSIES AND POLYPECTOMY    . ELBOW SURGERY  08/2015   bilateral  . EYE SURGERY     'repair of tear to left eye from pliers'  . HERNIA REPAIR  07/2010  . KNEE SURGERY     3 times on both knee's each  . MULTIPLE TOOTH EXTRACTIONS    . SHOULDER ARTHROSCOPY    . TOTAL HIP ARTHROPLASTY  08/2010  . TOTAL KNEE ARTHROPLASTY Right 12/20/2015  . TOTAL KNEE ARTHROPLASTY Right 12/20/2015   Procedure: TOTAL KNEE ARTHROPLASTY;  Surgeon: [name], MD;  Location: [facility];  Service: Orthopedics;  Laterality: Right;    There were no vitals filed for this visit.  Subjective Assessment - 03/22/18 1546    Subjective   Pt reports that he has new numbness in right hand MD aware, pt reports increased stiffness in left index finger    Pertinent History  Pt with hx of trigger finger, left radial n. palsy, s/p C5-T2 fusion, foraminotomies at C5,C6, 7, T11, and  laminectomies C5, C6, C7, and T1 on 12/18/17    Limitations  no bending, arching, and twisting, cervical collar at all times except for night time, showering and pt wears soift collar at night    Patient Stated Goals  to be able to play golf, to be able to write, and feed himself with LUE    Currently in Pain?  No/denies                      Treatment:US 55mhz, 0.8 w/cm 2 to left middle finger and palm, followed by [place] to index finger same parameters x 8 mins due to pt report of stiffness. No adverse reactions. Pt reports his MD recommended a wrist cock up splint for use at night for RUE due to numbness. A/ROM finger extension and PIP flexion/ extension for middle finger with MP blocked x 15 reps, min v.c     OT Education - 03/22/18 1547    Education provided  Yes    Education Details  wrist cock up splint wear care and precautions(pt reports MD recommended he gets one for RUE), PIP extension spring splint for left middle finger, P/ROM exercises for left index finger- see pt instructions.    Person(s)  Educated  Patient    Methods  Explanation;Demonstration;Verbal cues;Handout    Comprehension  Verbalized understanding;Returned demonstration;Verbal cues required       OT Short Term Goals - 03/14/18 1311      OT SHORT TERM GOAL #1   Title  I with LUE positioning including splinting prn and precautions in order to minimize triggering and to improve functional use.    Time  4    Period  Weeks    Status  Achieved      OT SHORT TERM GOAL #2   Title  I with intial HEP.    Time  4    Period  Weeks    Status  Achieved      OT SHORT TERM GOAL #3   Title  Pt will verbalize understanding of adapted strategies to maximize safety and I with ADLs/IADLS.(fasten buttons, writing)    Time  4    Period  Weeks    Status  Achieved      OT SHORT TERM GOAL #4   Title  Pt will demonstrate ability to write his name with 100% legibility using AE prn.    Time  4    Period  Weeks     Status  Achieved      OT SHORT TERM GOAL #5   Title  Pt will demonstrate improved LUE fine motor coordination for ADLs  as evidenced by decreasing 9 hole peg test score to 90 secs or less.    Baseline  03/14/18 90.58 sec    Time  4    Period  Weeks    Status  On-going        OT Long Term Goals - 02/12/18 1308      OT LONG TERM GOAL #1   Title  Pt will resume use of LUE as his dominant hand at least 75% of the time for ADLS/ IADLs.    Time  12    Period  Weeks    Status  New    Target Date  05/13/18      OT LONG TERM GOAL #2   Title  Pt will demonstrate ability to retrieve a lightweight object from overhead shelf at 120 shoulder flexion with right and left arms indivdually.    Time  12    Period  Weeks    Status  New      OT LONG TERM GOAL #3   Title  Pt will demonstrate improved fine motor coordination in LUE as evidenced by perfroming 9 hole peg test in 75 secs or less.    Time  12    Period  Weeks    Status  New      OT LONG TERM GOAL #4   Title  pt will perfrom light home managment/ cooking at a modified independent level.    Time  12    Period  Weeks    Status  New      OT LONG TERM GOAL #5   Title  Pt will demonstrate ability to write a short paragraph with 100% legibility using AE prn.    Time  12    Period  Weeks    Status  New            Plan - 03/22/18 1550    Clinical Impression Statement  Pt is progressing slowly towards goals. He verbalized unserstanding of dynamic ext. splint for left middle finger and wrist cock up for RUE( at night)  Rehab Potential  Good    Current Impairments/barriers affecting progress:  sensory impairment, trigger fingers,  back/ neck precautions    OT Frequency  1x / week    OT Duration  12 weeks    OT Treatment/Interventions  Self-care/ADL training;Therapeutic exercise;Visual/perceptual remediation/compensation;Patient/family education;Splinting;Neuromuscular education;Paraffin;Moist Heat;Fluidtherapy;Energy  conservation;Functional Mobility Training;Balance training;Therapeutic activities;Cognitive remediation/compensation;Passive range of motion;Manual Therapy;DME and/or AE instruction;Contrast Bath;Ultrasound;Cryotherapy    Plan  splint check, Continue E-stim for Radial N Palsy, focus on LTG's.    Consulted and Agree with Plan of Care  Patient       Patient will benefit from skilled therapeutic intervention in order to improve the following deficits and impairments:  Abnormal gait, Decreased knowledge of use of DME, Impaired flexibility, Pain, Decreased mobility, Decreased coordination, Decreased activity tolerance, Decreased endurance, Decreased range of motion, Decreased strength, Impaired UE functional use, Impaired perceived functional ability, Difficulty walking, Decreased safety awareness, Decreased knowledge of precautions, Decreased balance  Visit Diagnosis: Other disturbances of skin sensation  Muscle weakness (generalized)  Stiffness of left hand, not elsewhere classified    Problem List Patient Active Problem List   Diagnosis Date Noted  . Gait abnormality 02/20/2017  . Restless leg syndrome 07/05/2016  . Constipation due to opioid therapy 12/22/2015  . Primary localized osteoarthritis of right knee 12/08/2015  . Bullous emphysema ([place]) 11/02/2015  . Essential hypertension 11/02/2015  . Arthritis 11/02/2015  . Hyperlipidemia 11/02/2015  . OSA on CPAP 11/02/2015  . GERD (gastroesophageal reflux disease) 11/02/2015  . Septic olecranon bursitis 06/24/2015  . Multiple lung nodules on CT 06/30/2014    RINE,KATHRYN 03/22/2018, 3:52 PM  [facility] [address] [place], [name], [address] Phone: [phone]   Fax:  [phone]  Name: Richard Lynch MRN: [id number] Date of Birth: 07-Dec-1950

## 2018-03-22 NOTE — Patient Instructions (Signed)
PROM: Finger MP Joints   Passively bend _index_______ finger of hand at big knuckle until stretch is felt. Hold _10___ seconds. Relax. Straighten finger as far as possible. Repeat __5__ times per set.  Do __3__ sessions per day.   PIP Flexion (Passive)   Use other hand to bend the middle joint of ____index__ finger down as far as possible. Hold _10___ seconds. Repeat __5__ times. Do _3_ sessions per day.    PROM: Finger DIP Joints  index Passively bend ________ finger(s) of  hand at tip joint until stretch is felt. Hold __10__ seconds. Relax. Straighten finger as far as possible. Repeat _5___ times per set.  Do __3__ sessions per day.  Copyright  VHI. All rights reserved.     Splint wear- wear your finger spring splint for 30 mins 2x per day on middle joint of middle finger, remove if pain, numbness or circulatory changes. Stop wearing if problems.  Wrist brace right hand- wear at night only, remove if pain, pressure or too tight!

## 2018-03-22 NOTE — Therapy (Signed)
[facility] [address] [place], [name], [address] Phone: [phone]   Fax:  [phone]  Physical Therapy Treatment  Patient Details  Name: Richard Lynch MRN: [id number] Date of Birth: Nov 20, 1950 Referring Provider: Dr. Marykay Lex   Encounter Date: 03/22/2018  PT End of Session - 03/22/18 1113    Visit Number  4    Number of Visits  17 eval + 2x/week x 8 weeks    Date for PT Re-Evaluation  04/26/18    Authorization Type  Medicare a/b    PT Start Time  1108    PT Stop Time  1148    PT Time Calculation (min)  40 min    Activity Tolerance  Patient tolerated treatment well    Behavior During Therapy  [facility] for tasks assessed/performed       Past Medical History:  Diagnosis Date  . Constipation due to opioid therapy 12/22/2015  . Emphysema lung ([place])   . Gait abnormality 02/20/2017  . GERD (gastroesophageal reflux disease)   . High cholesterol   . Hypertension   . OSA (obstructive sleep apnea)    on CPAP  . Primary localized osteoarthritis of right knee 12/08/2015  . Septic olecranon bursitis 06/2015   MSSA    Past Surgical History:  Procedure Laterality Date  . COLONOSCOPY W/ BIOPSIES AND POLYPECTOMY    . ELBOW SURGERY  08/2015   bilateral  . EYE SURGERY     'repair of tear to left eye from pliers'  . HERNIA REPAIR  07/2010  . KNEE SURGERY     3 times on both knee's each  . MULTIPLE TOOTH EXTRACTIONS    . SHOULDER ARTHROSCOPY    . TOTAL HIP ARTHROPLASTY  08/2010  . TOTAL KNEE ARTHROPLASTY Right 12/20/2015  . TOTAL KNEE ARTHROPLASTY Right 12/20/2015   Procedure: TOTAL KNEE ARTHROPLASTY;  Surgeon: [name], MD;  Location: [facility];  Service: Orthopedics;  Laterality: Right;    There were no vitals filed for this visit.  Subjective Assessment - 03/22/18 1111    Subjective  The patient reports he notices some vertigo with rolling left, no longer to the right side.  He doesn't feel balance is improving-- PT  discussed needing to continue balance exercises and that it will take time.  He arrived wearing slide on flip flops.  PT discussed safe shoewear and use of shoe horn.    Pertinent History  s/p C5-T2 fusion, foraminotomies at C5,C6, 7, T11, and laminectomies C5, C6, C7, and T1 on 12/18/17, emphysema, sleep apnea; L4-L5 cyst per epic notes/chart review    Patient Stated Goals  Improve balance and improve dizziness "get rid of dizziness".    Currently in Pain?  No/denies             Vestibular Assessment - 03/22/18 1114      Vestibular Assessment   General Observation  The patient notices dizziness with rolling to the left.        Symptom Behavior   Type of Dizziness  Spinning    Frequency of Dizziness  daily    Duration of Dizziness  seconds, only when he first wakes up or gets into bed.  Less intensity of spining.      Positional Testing   Horizontal Canal Testing  Horizontal Canal Right;Horizontal Canal Left      Horizontal Canal Right   Horizontal Canal Right Duration  20 seconds    Horizontal Canal Right Symptoms  Ageotrophic more noted  with alexander's law      Horizontal Canal Left   Horizontal Canal Left Duration  trace dizziness    Horizontal Canal Left Symptoms  Normal      Orthostatics   BP supine (x 5 minutes)  134/74 normal cuff     HR supine (x 5 minutes)  69    BP standing (after 1 minute)  132/75 lightheadedness with transition to stand    HR standing (after 1 minute)  72    BP standing (after 3 minutes)  135/72    HR standing (after 3 minutes)  71                Vestibular Treatment/Exercise - 03/22/18 1117      Vestibular Treatment/Exercise   Vestibular Treatment Provided  Habituation;Canalith Repositioning    Canalith Repositioning  Canal Roll Left Kim, 2011 maneuver with vibration    Habituation Exercises  Horizontal Roll      Canal Roll Left   Number of Reps   1    Overall Response   Symptoms Resolved    Response Details   Used vibration  behind L ear due to evidence of L cupulolithiasis horizontal canal.  Patient tolerated without significant symptoms during maneuver.       Nestor Lewandowsky   Number of Reps   2    Symptom Description   No symptoms reported.      Horizontal Roll   Number of Reps   5    Symptom Description   symptoms dec'd throughout rolling, however patient felt increased dizziness when returning to sitting              PT Short Term Goals - 03/14/18 1300      PT SHORT TERM GOAL #1   Title  The patient will be indep with modified habituation program.    Time  4    Period  Weeks    Status  Achieved      PT SHORT TERM GOAL #2   Title  The patient will subjectively report no dizziness with bed mobility in the morning.    Time  4    Period  Weeks    Status  On-going    Target Date  03/27/18      PT SHORT TERM GOAL #3   Title  Assess standing balance with Berg and FGA and write LTGs.    Time  4    Period  Weeks    Status  Achieved      PT SHORT TERM GOAL #4   Title  Assess gait speed and write goal for LTGs.    Time  4    Period  Weeks    Status  Achieved        PT Long Term Goals - 03/14/18 1301      PT LONG TERM GOAL #1   Title  The patient will be indep with HEP for LE strengthening, balance, and gait activities.    Time  8    Period  Weeks    Target Date  04/26/18      PT LONG TERM GOAL #2   Title  Pt will improve BERG balance by 8 points    Baseline  45/56    Time  8    Period  Weeks    Status  Revised    Target Date  04/26/18      PT LONG TERM GOAL #3   Title  Pt will improve  gait velocity to >/= 3.8 ft/sec    Baseline  3.6 ft/sec    Time  8    Period  Weeks    Status  Revised    Target Date  04/26/18      PT LONG TERM GOAL #4   Title  Pt will improve FGA by 4 points to indicate decreased falls risk    Baseline  23/30    Time  8    Period  Weeks    Status  Revised    Target Date  04/26/18            Plan - 03/22/18 1217    Clinical Impression  Statement  The patient does not have posterior canal vertigo per modified sidelying test (due to neck limitations), however he notes dizziness with rolling at home.  Today, he has apogeotropic nystagmus viewed with R rolling and L horizontal canal treated (this supports a subjective report that leaning to left earlier this week was challenging).  Patient tolerated horizontal canal repositioning maneuver b/c neck could be maintained in neutral position.  PT recommended continued balance HEP and improved shoewear.     PT Treatment/Interventions  ADLs/Self Care Home Management;Balance training;Neuromuscular re-education;Canalith Repostioning;Vestibular;Manual techniques;Patient/family education;Gait training;Functional mobility training;Therapeutic activities;Therapeutic exercise;Moist Heat    PT Next Visit Plan  Balance, endurance, vertigo treatment as indicated.    Consulted and Agree with Plan of Care  Patient       Patient will benefit from skilled therapeutic intervention in order to improve the following deficits and impairments:  Abnormal gait, Impaired sensation, Dizziness, Decreased activity tolerance, Decreased strength, Decreased balance, Postural dysfunction, Decreased range of motion  Visit Diagnosis: Other abnormalities of gait and mobility  Unsteadiness on feet  BPPV (benign paroxysmal positional vertigo), right     Problem List Patient Active Problem List   Diagnosis Date Noted  . Gait abnormality 02/20/2017  . Restless leg syndrome 07/05/2016  . Constipation due to opioid therapy 12/22/2015  . Primary localized osteoarthritis of right knee 12/08/2015  . Bullous emphysema ([place]) 11/02/2015  . Essential hypertension 11/02/2015  . Arthritis 11/02/2015  . Hyperlipidemia 11/02/2015  . OSA on CPAP 11/02/2015  . GERD (gastroesophageal reflux disease) 11/02/2015  . Septic olecranon bursitis 06/24/2015  . Multiple lung nodules on CT 06/30/2014    Alcario Tinkey,  PT 03/22/2018, 12:20 PM  [facility] [address] [place], [name], [address] Phone: [phone]   Fax:  [phone]  Name: Richard Lynch MRN: [id number] Date of Birth: [birth year]

## 2018-03-27 ENCOUNTER — Encounter: Payer: Self-pay | Admitting: Rehabilitative and Restorative Service Providers"

## 2018-03-27 ENCOUNTER — Ambulatory Visit: Payer: Medicare Other | Attending: Family Medicine | Admitting: Rehabilitative and Restorative Service Providers"

## 2018-03-27 ENCOUNTER — Encounter: Payer: Medicare Other | Admitting: Occupational Therapy

## 2018-03-27 DIAGNOSIS — M6281 Muscle weakness (generalized): Secondary | ICD-10-CM | POA: Insufficient documentation

## 2018-03-27 DIAGNOSIS — R2681 Unsteadiness on feet: Secondary | ICD-10-CM | POA: Insufficient documentation

## 2018-03-27 DIAGNOSIS — R2689 Other abnormalities of gait and mobility: Secondary | ICD-10-CM | POA: Diagnosis not present

## 2018-03-27 NOTE — Therapy (Signed)
[name], [name], [address] Phone: [phone]   Fax:  [phone]  Physical Therapy Treatment  Patient Details  Name: Richard Lynch MRN: [id number] Date of Birth: [birth year] Referring Provider: Dr. Marykay Lex   Encounter Date: 03/27/2018  PT End of Session - 03/27/18 1224    Visit Number  5    Number of Visits  17 eval + 2x/week x 8 weeks    Date for PT Re-Evaluation  04/26/18    Authorization Type  Medicare a/b    PT Start Time  1155    PT Stop Time  1235    PT Time Calculation (min)  40 min    Equipment Utilized During Treatment  Gait belt    Activity Tolerance  Patient tolerated treatment well    Behavior During Therapy  [facility] for tasks assessed/performed       Past Medical History:  Diagnosis Date  . Constipation due to opioid therapy 12/22/2015  . Emphysema lung ([place])   . Gait abnormality 02/20/2017  . GERD (gastroesophageal reflux disease)   . High cholesterol   . Hypertension   . OSA (obstructive sleep apnea)    on CPAP  . Primary localized osteoarthritis of right knee 12/08/2015  . Septic olecranon bursitis 06/2015   MSSA    Past Surgical History:  Procedure Laterality Date  . COLONOSCOPY W/ BIOPSIES AND POLYPECTOMY    . ELBOW SURGERY  08/2015   bilateral  . EYE SURGERY     'repair of tear to left eye from pliers'  . HERNIA REPAIR  07/2010  . KNEE SURGERY     3 times on both knee's each  . MULTIPLE TOOTH EXTRACTIONS    . SHOULDER ARTHROSCOPY    . TOTAL HIP ARTHROPLASTY  08/2010  . TOTAL KNEE ARTHROPLASTY Right 12/20/2015  . TOTAL KNEE ARTHROPLASTY Right 12/20/2015   Procedure: TOTAL KNEE ARTHROPLASTY;  Surgeon: [name], MD;  Location: [facility];  Service: Orthopedics;  Laterality: Right;    There were no vitals filed for this visit.  Subjective Assessment - 03/27/18 1156    Subjective  The patient reports vertigo is better, he has occasional episodes of vertigo to the left  side with bed mobility.  He feels his balance has also improved.  He notes his ankle swelling limits his mobiity.     Pertinent History  s/p C5-T2 fusion, foraminotomies at C5,C6, 7, T11, and laminectomies C5, C6, C7, and T1 on 12/18/17, emphysema, sleep apnea; L4-L5 cyst per epic notes/chart review    Patient Stated Goals  Improve balance and improve dizziness "get rid of dizziness".    Currently in Pain?  No/denies         [name] PT Assessment - 03/27/18 1200      Standardized Balance Assessment   Standardized Balance Assessment  Berg Balance Test      Berg Balance Test   Sit to Stand  Able to stand without using hands and stabilize independently    Standing Unsupported  Able to stand safely 2 minutes    Sitting with Back Unsupported but Feet Supported on Floor or Stool  Able to sit safely and securely 2 minutes    Stand to Sit  Sits safely with minimal use of hands    Transfers  Able to transfer safely, minor use of hands    Standing Unsupported with Eyes Closed  Able to stand 10 seconds safely    Standing Ubsupported with Feet  Together  Able to place feet together independently and stand 1 minute safely    From Standing, Reach Forward with Outstretched Arm  Can reach confidently >25 cm (10")    From Standing Position, Pick up Object from [place] to pick up shoe safely and easily    From Standing Position, Turn to Look Behind Over each Shoulder  Turn sideways only but maintains balance due to neck limitations    Turn 360 Degrees  Able to turn 360 degrees safely in 4 seconds or less    Standing Unsupported, Alternately Place Feet on Step/Stool  Able to stand independently and safely and complete 8 steps in 20 seconds    Standing Unsupported, One Foot in Front  Able to plae foot ahead of the other independently and hold 30 seconds    Standing on One Leg  Tries to lift leg/unable to hold 3 seconds but remains standing independently    Total Score  50    Berg comment:  50/56       Functional Gait  Assessment   Gait assessed   Yes    Gait Level Surface  Walks 20 ft in less than 5.5 sec, no assistive devices, good speed, no evidence for imbalance, normal gait pattern, deviates no more than 6 in outside of the 12 in walkway width.    Change in Gait Speed  Able to smoothly change walking speed without loss of balance or gait deviation. Deviate no more than 6 in outside of the 12 in walkway width.    Gait with Horizontal Head Turns  Performs head turns smoothly with slight change in gait velocity (eg, minor disruption to smooth gait path), deviates 6-10 in outside 12 in walkway width, or uses an assistive device. limited due to neck    Gait with Vertical Head Turns  Performs task with slight change in gait velocity (eg, minor disruption to smooth gait path), deviates 6 - 10 in outside 12 in walkway width or uses assistive device limited due to neck    Gait and Pivot Turn  Pivot turns safely within 3 sec and stops quickly with no loss of balance.    Step Over Obstacle  Is able to step over 2 stacked shoe boxes taped together (9 in total height) without changing gait speed. No evidence of imbalance.    Gait with Narrow Base of Support  Ambulates 4-7 steps.    Gait with Eyes Closed  Walks 20 ft, no assistive devices, good speed, no evidence of imbalance, normal gait pattern, deviates no more than 6 in outside 12 in walkway width. Ambulates 20 ft in less than 7 sec.    Ambulating Backwards  Walks 20 ft, no assistive devices, good speed, no evidence for imbalance, normal gait    Steps  Alternating feet, no rail.    Total Score  26    FGA comment:  26/30                   [facility] Adult PT Treatment/Exercise - 03/27/18 1200      Ambulation/Gait   Ambulation/Gait  Yes    Ambulation/Gait Assistance  5: Supervision    Ambulation/Gait Assistance Details  Outdoor/ community surface gait including pinestraw, rubber mulch, curbs without loss of balance.    Ambulation Distance  (Feet)  300 Feet    Assistive device  None    Ambulation Surface  Level;Indoor;Outdoor;Paved;Grass;Gravel;Unlevel    Gait velocity  3.49 ft/sec    Stairs  Yes    Stairs Assistance  5: Supervision;6: Modified independent (Device/Increase time) mod indep after cues to slow speed    Stair Management Technique  No rails;Alternating pattern    Number of Stairs  12      Neuro Re-ed    Neuro Re-ed Details   Had patient demonstrate small AROM neck within comfortable/tolerable range.  Added slow speed x 10 reps of gaze x 1 adaptation (clarified with the patient precautions before doing).  Corner balance exercises with feet partial heel/toe with eyes closed, tandem with eyes open, narrowing base of support on foam with eyes closed x 3 reps.  Heel and toe walking x 10 feet along countertop x 6 repetitions.               PT Short Term Goals - 03/27/18 1157      PT SHORT TERM GOAL #1   Title  The patient will be indep with modified habituation program.    Time  4    Period  Weeks    Status  Achieved      PT SHORT TERM GOAL #2   Title  The patient will subjectively report no dizziness with bed mobility in the morning.    Baseline  Notes he has a minute of "off balance" sensation with sitting up in the morning.      Time  4    Period  Weeks    Status  Partially Met      PT SHORT TERM GOAL #3   Title  Assess standing balance with Berg and FGA and write LTGs.    Time  4    Period  Weeks    Status  Achieved      PT SHORT TERM GOAL #4   Title  Assess gait speed and write goal for LTGs.    Time  4    Period  Weeks    Status  Achieved        PT Long Term Goals - 03/27/18 1519      PT LONG TERM GOAL #1   Title  The patient will be indep with HEP for LE strengthening, balance, and gait activities.    Time  8    Period  Weeks      PT LONG TERM GOAL #2   Title  Pt will improve BERG balance by 8 points    Baseline  45/56 (50/56 on 03/27/18)    Time  8    Period  Weeks    Status   On-going      PT LONG TERM GOAL #3   Title  Pt will improve gait velocity to >/= 3.8 ft/sec    Baseline  3.6 ft/sec    Time  8    Period  Weeks    Status  On-going      PT LONG TERM GOAL #4   Title  Pt will improve FGA by 4 points to indicate decreased falls risk    Baseline  23/30 (improved to 26/30 on 03/27/2018)    Time  8    Period  Weeks    Status  On-going            Plan - 03/27/18 1520    Clinical Impression Statement  The patient is showing progress on Berg ( 5 point improvement) and on FGA (3 point improvement).  He notes significant improvement this week in dizziness and imbalance.  PT continues to emphasize multi-sensory balance challenges.  Continue  working to [employer].     PT Treatment/Interventions  ADLs/Self Care Home Management;Balance training;Neuromuscular re-education;Canalith Repostioning;Vestibular;Manual techniques;Patient/family education;Gait training;Functional mobility training;Therapeutic activities;Therapeutic exercise;Moist Heat    PT Next Visit Plan  Progress multi-sensory balance activities, address vertigo through habituation as needed, unlevel surface negotiatoin, dynamic gait.    Consulted and Agree with Plan of Care  Patient       Patient will benefit from skilled therapeutic intervention in order to improve the following deficits and impairments:  Abnormal gait, Impaired sensation, Dizziness, Decreased activity tolerance, Decreased strength, Decreased balance, Postural dysfunction, Decreased range of motion  Visit Diagnosis: Muscle weakness (generalized)  Other abnormalities of gait and mobility  Unsteadiness on feet     Problem List Patient Active Problem List   Diagnosis Date Noted  . Gait abnormality 02/20/2017  . Restless leg syndrome 07/05/2016  . Constipation due to opioid therapy 12/22/2015  . Primary localized osteoarthritis of right knee 12/08/2015  . Bullous emphysema ([place]) 11/02/2015  . Essential hypertension 11/02/2015  .  Arthritis 11/02/2015  . Hyperlipidemia 11/02/2015  . OSA on CPAP 11/02/2015  . GERD (gastroesophageal reflux disease) 11/02/2015  . Septic olecranon bursitis 06/24/2015  . Multiple lung nodules on CT 06/30/2014    Brondon Wann, PT 03/27/2018, 3:21 PM  [facility] [address] [place], [name], [address] Phone: [phone]   Fax:  [phone]  Name: Richard Lynch MRN: [id number] Date of Birth: 1950-11-18

## 2018-03-29 ENCOUNTER — Ambulatory Visit: Payer: Medicare Other | Admitting: Physical Therapy

## 2018-03-29 ENCOUNTER — Ambulatory Visit: Payer: Medicare Other | Admitting: Occupational Therapy

## 2018-04-03 ENCOUNTER — Encounter: Payer: Medicare Other | Admitting: Occupational Therapy

## 2018-04-03 ENCOUNTER — Ambulatory Visit: Payer: Medicare Other | Admitting: Rehabilitative and Restorative Service Providers"

## 2018-04-04 ENCOUNTER — Ambulatory Visit: Payer: Medicare Other | Admitting: Rehabilitative and Restorative Service Providers"

## 2018-04-04 ENCOUNTER — Encounter: Payer: Medicare Other | Admitting: Occupational Therapy

## 2018-04-12 ENCOUNTER — Encounter: Payer: Self-pay | Admitting: Rehabilitative and Restorative Service Providers"

## 2018-04-12 NOTE — Therapy (Signed)
[facility] [address] [place], [name], [address] Phone: [phone]   Fax:  [phone]  Patient Details  Name: Richard Lynch MRN: [id number] Date of Birth: May 26, 1951 Referring Provider:  Dr. Marykay Lex Encounter Date: last encounter 03/27/2018  PHYSICAL THERAPY DISCHARGE SUMMARY  Visits from Start of Care: 5   Current functional level related to goals / functional outcomes: PT Short Term Goals - 03/27/18 1157      PT SHORT TERM GOAL #1   Title  The patient will be indep with modified habituation program.    Time  4    Period  Weeks    Status  Achieved      PT SHORT TERM GOAL #2   Title  The patient will subjectively report no dizziness with bed mobility in the morning.    Baseline  Notes he has a minute of "off balance" sensation with sitting up in the morning.      Time  4    Period  Weeks    Status  Partially Met      PT SHORT TERM GOAL #3   Title  Assess standing balance with Berg and FGA and write LTGs.    Time  4    Period  Weeks    Status  Achieved      PT SHORT TERM GOAL #4   Title  Assess gait speed and write goal for LTGs.    Time  4    Period  Weeks    Status  Achieved      PT Long Term Goals - 03/27/18 1519      PT LONG TERM GOAL #1   Title  The patient will be indep with HEP for LE strengthening, balance, and gait activities.    Time  8    Period  Weeks      PT LONG TERM GOAL #2   Title  Pt will improve BERG balance by 8 points    Baseline  45/56 (50/56 on 03/27/18)    Time  8    Period  Weeks    Status  On-going      PT LONG TERM GOAL #3   Title  Pt will improve gait velocity to >/= 3.8 ft/sec    Baseline  3.6 ft/sec    Time  8    Period  Weeks    Status  On-going      PT LONG TERM GOAL #4   Title  Pt will improve FGA by 4 points to indicate decreased falls risk    Baseline  23/30 (improved to 26/30 on 03/27/2018)    Time  8    Period  Weeks    Status  On-going          Remaining deficits: *unknown- all goals not checked as patient did not return to complete plan of care.  Had shown functional improvement at time of d/c.   Education / Equipment: Home program, home safety, neck precautions.  Plan: Patient agrees to discharge.  Patient goals were partially met. Patient is being discharged due to not returning since the last visit.  ?????      Thank you for the referral of this patient. Rudell Cobb, MPT   Froylan Hobby 04/12/2018, 9:08 AM  [facility] [address] [place], [name], [address] Phone: [phone]   Fax:  [phone]

## 2018-05-31 DIAGNOSIS — E559 Vitamin D deficiency, unspecified: Secondary | ICD-10-CM | POA: Diagnosis not present

## 2018-05-31 DIAGNOSIS — I10 Essential (primary) hypertension: Secondary | ICD-10-CM | POA: Diagnosis not present

## 2018-05-31 DIAGNOSIS — I739 Peripheral vascular disease, unspecified: Secondary | ICD-10-CM | POA: Diagnosis not present

## 2018-05-31 DIAGNOSIS — I493 Ventricular premature depolarization: Secondary | ICD-10-CM | POA: Diagnosis not present

## 2018-05-31 DIAGNOSIS — Z79899 Other long term (current) drug therapy: Secondary | ICD-10-CM | POA: Diagnosis not present

## 2018-05-31 DIAGNOSIS — F17291 Nicotine dependence, other tobacco product, in remission: Secondary | ICD-10-CM | POA: Diagnosis not present

## 2018-05-31 DIAGNOSIS — R7301 Impaired fasting glucose: Secondary | ICD-10-CM | POA: Diagnosis not present

## 2018-05-31 DIAGNOSIS — G4733 Obstructive sleep apnea (adult) (pediatric): Secondary | ICD-10-CM | POA: Diagnosis not present

## 2018-05-31 DIAGNOSIS — R9389 Abnormal findings on diagnostic imaging of other specified body structures: Secondary | ICD-10-CM | POA: Diagnosis not present

## 2018-05-31 DIAGNOSIS — I951 Orthostatic hypotension: Secondary | ICD-10-CM | POA: Diagnosis not present

## 2018-05-31 DIAGNOSIS — E782 Mixed hyperlipidemia: Secondary | ICD-10-CM | POA: Diagnosis not present

## 2018-05-31 DIAGNOSIS — R202 Paresthesia of skin: Secondary | ICD-10-CM | POA: Diagnosis not present

## 2018-05-31 DIAGNOSIS — G4719 Other hypersomnia: Secondary | ICD-10-CM | POA: Diagnosis not present

## 2018-05-31 DIAGNOSIS — G629 Polyneuropathy, unspecified: Secondary | ICD-10-CM | POA: Diagnosis not present

## 2018-06-03 DIAGNOSIS — H02834 Dermatochalasis of left upper eyelid: Secondary | ICD-10-CM | POA: Diagnosis not present

## 2018-06-03 DIAGNOSIS — D239 Other benign neoplasm of skin, unspecified: Secondary | ICD-10-CM | POA: Diagnosis not present

## 2018-06-03 DIAGNOSIS — H02831 Dermatochalasis of right upper eyelid: Secondary | ICD-10-CM | POA: Diagnosis not present

## 2018-06-03 DIAGNOSIS — H57813 Brow ptosis, bilateral: Secondary | ICD-10-CM | POA: Diagnosis not present

## 2018-06-03 DIAGNOSIS — H0289 Other specified disorders of eyelid: Secondary | ICD-10-CM | POA: Diagnosis not present

## 2018-06-27 DIAGNOSIS — R531 Weakness: Secondary | ICD-10-CM | POA: Diagnosis not present

## 2018-06-27 DIAGNOSIS — Z4789 Encounter for other orthopedic aftercare: Secondary | ICD-10-CM | POA: Diagnosis not present

## 2018-06-27 DIAGNOSIS — R202 Paresthesia of skin: Secondary | ICD-10-CM | POA: Diagnosis not present

## 2018-06-27 DIAGNOSIS — M4323 Fusion of spine, cervicothoracic region: Secondary | ICD-10-CM | POA: Diagnosis not present

## 2018-07-01 ENCOUNTER — Other Ambulatory Visit: Payer: Self-pay | Admitting: Orthopedic Surgery

## 2018-07-01 DIAGNOSIS — R9389 Abnormal findings on diagnostic imaging of other specified body structures: Secondary | ICD-10-CM | POA: Diagnosis not present

## 2018-07-01 DIAGNOSIS — M5412 Radiculopathy, cervical region: Secondary | ICD-10-CM | POA: Diagnosis not present

## 2018-07-01 DIAGNOSIS — M4323 Fusion of spine, cervicothoracic region: Secondary | ICD-10-CM

## 2018-07-01 DIAGNOSIS — I739 Peripheral vascular disease, unspecified: Secondary | ICD-10-CM | POA: Diagnosis not present

## 2018-07-01 DIAGNOSIS — R202 Paresthesia of skin: Secondary | ICD-10-CM

## 2018-07-01 DIAGNOSIS — R531 Weakness: Secondary | ICD-10-CM

## 2018-07-01 DIAGNOSIS — E559 Vitamin D deficiency, unspecified: Secondary | ICD-10-CM | POA: Diagnosis not present

## 2018-07-01 DIAGNOSIS — R7301 Impaired fasting glucose: Secondary | ICD-10-CM | POA: Diagnosis not present

## 2018-07-01 DIAGNOSIS — I10 Essential (primary) hypertension: Secondary | ICD-10-CM | POA: Diagnosis not present

## 2018-07-01 DIAGNOSIS — G629 Polyneuropathy, unspecified: Secondary | ICD-10-CM | POA: Diagnosis not present

## 2018-07-01 DIAGNOSIS — F17291 Nicotine dependence, other tobacco product, in remission: Secondary | ICD-10-CM | POA: Diagnosis not present

## 2018-07-01 DIAGNOSIS — E782 Mixed hyperlipidemia: Secondary | ICD-10-CM | POA: Diagnosis not present

## 2018-07-01 DIAGNOSIS — G4719 Other hypersomnia: Secondary | ICD-10-CM | POA: Diagnosis not present

## 2018-07-01 DIAGNOSIS — I951 Orthostatic hypotension: Secondary | ICD-10-CM | POA: Diagnosis not present

## 2018-07-01 DIAGNOSIS — I493 Ventricular premature depolarization: Secondary | ICD-10-CM | POA: Diagnosis not present

## 2018-07-01 DIAGNOSIS — Z4789 Encounter for other orthopedic aftercare: Secondary | ICD-10-CM

## 2018-07-02 DIAGNOSIS — M6281 Muscle weakness (generalized): Secondary | ICD-10-CM | POA: Diagnosis not present

## 2018-07-02 DIAGNOSIS — M720 Palmar fascial fibromatosis [Dupuytren]: Secondary | ICD-10-CM | POA: Insufficient documentation

## 2018-07-03 ENCOUNTER — Ambulatory Visit
Admission: RE | Admit: 2018-07-03 | Discharge: 2018-07-03 | Disposition: A | Discharge status: Home / Self Care | Payer: Medicare Other | Source: Ambulatory Visit | Attending: Orthopedic Surgery | Admitting: Orthopedic Surgery

## 2018-07-03 ENCOUNTER — Other Ambulatory Visit: Payer: Self-pay | Admitting: Orthopedic Surgery

## 2018-07-03 ENCOUNTER — Other Ambulatory Visit: Payer: Medicare Other

## 2018-07-03 DIAGNOSIS — M4802 Spinal stenosis, cervical region: Secondary | ICD-10-CM | POA: Diagnosis not present

## 2018-07-03 DIAGNOSIS — R531 Weakness: Secondary | ICD-10-CM

## 2018-07-03 DIAGNOSIS — R202 Paresthesia of skin: Secondary | ICD-10-CM

## 2018-07-03 DIAGNOSIS — Z4789 Encounter for other orthopedic aftercare: Secondary | ICD-10-CM

## 2018-07-03 DIAGNOSIS — M4323 Fusion of spine, cervicothoracic region: Secondary | ICD-10-CM

## 2018-07-04 ENCOUNTER — Ambulatory Visit
Admission: RE | Admit: 2018-07-04 | Discharge: 2018-07-04 | Disposition: A | Discharge status: Home / Self Care | Payer: Medicare Other | Source: Ambulatory Visit | Attending: Orthopedic Surgery | Admitting: Orthopedic Surgery

## 2018-07-04 DIAGNOSIS — M4323 Fusion of spine, cervicothoracic region: Secondary | ICD-10-CM

## 2018-07-04 DIAGNOSIS — M5124 Other intervertebral disc displacement, thoracic region: Secondary | ICD-10-CM | POA: Diagnosis not present

## 2018-07-04 DIAGNOSIS — R531 Weakness: Secondary | ICD-10-CM

## 2018-07-04 DIAGNOSIS — R202 Paresthesia of skin: Secondary | ICD-10-CM

## 2018-07-23 ENCOUNTER — Inpatient Hospital Stay: Admission: RE | Admit: 2018-07-23 | Payer: Medicare Other | Source: Ambulatory Visit

## 2018-07-24 ENCOUNTER — Ambulatory Visit (INDEPENDENT_AMBULATORY_CARE_PROVIDER_SITE_OTHER)
Admission: RE | Admit: 2018-07-24 | Discharge: 2018-07-24 | Disposition: A | Discharge status: Home / Self Care | Payer: Medicare Other | Source: Ambulatory Visit | Attending: Acute Care | Admitting: Acute Care

## 2018-07-24 DIAGNOSIS — G5612 Other lesions of median nerve, left upper limb: Secondary | ICD-10-CM | POA: Diagnosis not present

## 2018-07-24 DIAGNOSIS — Z23 Encounter for immunization: Secondary | ICD-10-CM | POA: Diagnosis not present

## 2018-07-24 DIAGNOSIS — M5412 Radiculopathy, cervical region: Secondary | ICD-10-CM | POA: Diagnosis not present

## 2018-07-24 DIAGNOSIS — F17210 Nicotine dependence, cigarettes, uncomplicated: Secondary | ICD-10-CM | POA: Diagnosis not present

## 2018-07-24 DIAGNOSIS — Z122 Encounter for screening for malignant neoplasm of respiratory organs: Secondary | ICD-10-CM

## 2018-07-25 DIAGNOSIS — M5412 Radiculopathy, cervical region: Secondary | ICD-10-CM | POA: Diagnosis not present

## 2018-07-25 DIAGNOSIS — G5602 Carpal tunnel syndrome, left upper limb: Secondary | ICD-10-CM | POA: Diagnosis not present

## 2018-07-25 DIAGNOSIS — M5124 Other intervertebral disc displacement, thoracic region: Secondary | ICD-10-CM | POA: Diagnosis not present

## 2018-07-25 DIAGNOSIS — T84418A Breakdown (mechanical) of other internal orthopedic devices, implants and grafts, initial encounter: Secondary | ICD-10-CM | POA: Diagnosis not present

## 2018-07-25 DIAGNOSIS — R531 Weakness: Secondary | ICD-10-CM | POA: Diagnosis not present

## 2018-07-26 ENCOUNTER — Other Ambulatory Visit: Payer: Self-pay | Admitting: Acute Care

## 2018-07-26 DIAGNOSIS — Z87891 Personal history of nicotine dependence: Secondary | ICD-10-CM

## 2018-07-26 DIAGNOSIS — F17210 Nicotine dependence, cigarettes, uncomplicated: Principal | ICD-10-CM

## 2018-07-26 DIAGNOSIS — Z122 Encounter for screening for malignant neoplasm of respiratory organs: Secondary | ICD-10-CM

## 2018-08-01 DIAGNOSIS — G629 Polyneuropathy, unspecified: Secondary | ICD-10-CM | POA: Diagnosis not present

## 2018-08-01 DIAGNOSIS — M5412 Radiculopathy, cervical region: Secondary | ICD-10-CM | POA: Diagnosis not present

## 2018-08-01 DIAGNOSIS — R7301 Impaired fasting glucose: Secondary | ICD-10-CM | POA: Diagnosis not present

## 2018-08-01 DIAGNOSIS — I951 Orthostatic hypotension: Secondary | ICD-10-CM | POA: Diagnosis not present

## 2018-08-01 DIAGNOSIS — E559 Vitamin D deficiency, unspecified: Secondary | ICD-10-CM | POA: Diagnosis not present

## 2018-08-01 DIAGNOSIS — I739 Peripheral vascular disease, unspecified: Secondary | ICD-10-CM | POA: Diagnosis not present

## 2018-08-01 DIAGNOSIS — G4733 Obstructive sleep apnea (adult) (pediatric): Secondary | ICD-10-CM | POA: Diagnosis not present

## 2018-08-01 DIAGNOSIS — I10 Essential (primary) hypertension: Secondary | ICD-10-CM | POA: Diagnosis not present

## 2018-08-01 DIAGNOSIS — F17291 Nicotine dependence, other tobacco product, in remission: Secondary | ICD-10-CM | POA: Diagnosis not present

## 2018-08-01 DIAGNOSIS — I493 Ventricular premature depolarization: Secondary | ICD-10-CM | POA: Diagnosis not present

## 2018-08-01 DIAGNOSIS — E782 Mixed hyperlipidemia: Secondary | ICD-10-CM | POA: Diagnosis not present

## 2018-08-02 ENCOUNTER — Other Ambulatory Visit (HOSPITAL_COMMUNITY): Payer: Self-pay | Admitting: Orthopedic Surgery

## 2018-08-06 DIAGNOSIS — H903 Sensorineural hearing loss, bilateral: Secondary | ICD-10-CM | POA: Diagnosis not present

## 2018-08-06 DIAGNOSIS — R42 Dizziness and giddiness: Secondary | ICD-10-CM | POA: Diagnosis not present

## 2018-08-06 DIAGNOSIS — H838X3 Other specified diseases of inner ear, bilateral: Secondary | ICD-10-CM | POA: Diagnosis not present

## 2018-08-12 DIAGNOSIS — H02831 Dermatochalasis of right upper eyelid: Secondary | ICD-10-CM | POA: Diagnosis not present

## 2018-08-12 DIAGNOSIS — H57813 Brow ptosis, bilateral: Secondary | ICD-10-CM | POA: Diagnosis not present

## 2018-08-12 DIAGNOSIS — H02834 Dermatochalasis of left upper eyelid: Secondary | ICD-10-CM | POA: Diagnosis not present

## 2018-08-19 DIAGNOSIS — R531 Weakness: Secondary | ICD-10-CM | POA: Diagnosis not present

## 2018-08-19 DIAGNOSIS — T84418A Breakdown (mechanical) of other internal orthopedic devices, implants and grafts, initial encounter: Secondary | ICD-10-CM | POA: Diagnosis not present

## 2018-08-19 DIAGNOSIS — M4323 Fusion of spine, cervicothoracic region: Secondary | ICD-10-CM | POA: Diagnosis not present

## 2018-08-22 DIAGNOSIS — M960 Pseudarthrosis after fusion or arthrodesis: Secondary | ICD-10-CM | POA: Diagnosis not present

## 2018-08-22 DIAGNOSIS — R531 Weakness: Secondary | ICD-10-CM | POA: Diagnosis not present

## 2018-08-22 DIAGNOSIS — M4802 Spinal stenosis, cervical region: Secondary | ICD-10-CM | POA: Diagnosis not present

## 2018-08-22 DIAGNOSIS — Z981 Arthrodesis status: Secondary | ICD-10-CM | POA: Diagnosis not present

## 2018-08-22 DIAGNOSIS — R200 Anesthesia of skin: Secondary | ICD-10-CM | POA: Diagnosis not present

## 2018-08-22 DIAGNOSIS — M4314 Spondylolisthesis, thoracic region: Secondary | ICD-10-CM | POA: Diagnosis not present

## 2018-08-23 HISTORY — PX: HAND SURGERY: SHX662

## 2018-08-26 DIAGNOSIS — M960 Pseudarthrosis after fusion or arthrodesis: Secondary | ICD-10-CM | POA: Diagnosis not present

## 2018-08-26 DIAGNOSIS — R531 Weakness: Secondary | ICD-10-CM | POA: Diagnosis not present

## 2018-08-26 DIAGNOSIS — T84418A Breakdown (mechanical) of other internal orthopedic devices, implants and grafts, initial encounter: Secondary | ICD-10-CM | POA: Diagnosis not present

## 2018-09-11 ENCOUNTER — Other Ambulatory Visit: Payer: Self-pay

## 2018-09-11 DIAGNOSIS — G5622 Lesion of ulnar nerve, left upper limb: Secondary | ICD-10-CM | POA: Diagnosis not present

## 2018-09-11 DIAGNOSIS — G5602 Carpal tunnel syndrome, left upper limb: Secondary | ICD-10-CM | POA: Diagnosis not present

## 2018-09-11 DIAGNOSIS — D2112 Benign neoplasm of connective and other soft tissue of left upper limb, including shoulder: Secondary | ICD-10-CM | POA: Diagnosis not present

## 2018-09-24 DIAGNOSIS — R42 Dizziness and giddiness: Secondary | ICD-10-CM | POA: Diagnosis not present

## 2018-10-30 DIAGNOSIS — M25532 Pain in left wrist: Secondary | ICD-10-CM | POA: Diagnosis not present

## 2018-10-30 DIAGNOSIS — M720 Palmar fascial fibromatosis [Dupuytren]: Secondary | ICD-10-CM | POA: Diagnosis not present

## 2018-10-30 DIAGNOSIS — M65332 Trigger finger, left middle finger: Secondary | ICD-10-CM | POA: Diagnosis not present

## 2018-10-30 DIAGNOSIS — M25632 Stiffness of left wrist, not elsewhere classified: Secondary | ICD-10-CM | POA: Diagnosis not present

## 2018-10-30 DIAGNOSIS — M25642 Stiffness of left hand, not elsewhere classified: Secondary | ICD-10-CM | POA: Diagnosis not present

## 2018-10-30 DIAGNOSIS — R29898 Other symptoms and signs involving the musculoskeletal system: Secondary | ICD-10-CM | POA: Diagnosis not present

## 2018-10-30 DIAGNOSIS — G5602 Carpal tunnel syndrome, left upper limb: Secondary | ICD-10-CM | POA: Diagnosis not present

## 2018-10-30 DIAGNOSIS — M65342 Trigger finger, left ring finger: Secondary | ICD-10-CM | POA: Diagnosis not present

## 2018-10-30 DIAGNOSIS — G5632 Lesion of radial nerve, left upper limb: Secondary | ICD-10-CM | POA: Diagnosis not present

## 2018-10-31 DIAGNOSIS — R201 Hypoesthesia of skin: Secondary | ICD-10-CM | POA: Diagnosis not present

## 2018-10-31 DIAGNOSIS — R531 Weakness: Secondary | ICD-10-CM | POA: Diagnosis not present

## 2018-10-31 DIAGNOSIS — M4323 Fusion of spine, cervicothoracic region: Secondary | ICD-10-CM | POA: Diagnosis not present

## 2018-11-05 DIAGNOSIS — R42 Dizziness and giddiness: Secondary | ICD-10-CM | POA: Diagnosis not present

## 2018-11-05 DIAGNOSIS — H903 Sensorineural hearing loss, bilateral: Secondary | ICD-10-CM | POA: Diagnosis not present

## 2018-11-06 DIAGNOSIS — M25642 Stiffness of left hand, not elsewhere classified: Secondary | ICD-10-CM | POA: Diagnosis not present

## 2018-11-06 DIAGNOSIS — R29898 Other symptoms and signs involving the musculoskeletal system: Secondary | ICD-10-CM | POA: Diagnosis not present

## 2018-11-06 DIAGNOSIS — G5602 Carpal tunnel syndrome, left upper limb: Secondary | ICD-10-CM | POA: Diagnosis not present

## 2018-11-06 DIAGNOSIS — M25532 Pain in left wrist: Secondary | ICD-10-CM | POA: Diagnosis not present

## 2018-11-06 DIAGNOSIS — M25632 Stiffness of left wrist, not elsewhere classified: Secondary | ICD-10-CM | POA: Diagnosis not present

## 2018-11-12 DIAGNOSIS — R2689 Other abnormalities of gait and mobility: Secondary | ICD-10-CM | POA: Diagnosis not present

## 2018-11-13 DIAGNOSIS — M25632 Stiffness of left wrist, not elsewhere classified: Secondary | ICD-10-CM | POA: Diagnosis not present

## 2018-11-13 DIAGNOSIS — M25642 Stiffness of left hand, not elsewhere classified: Secondary | ICD-10-CM | POA: Diagnosis not present

## 2018-11-13 DIAGNOSIS — G5602 Carpal tunnel syndrome, left upper limb: Secondary | ICD-10-CM | POA: Diagnosis not present

## 2018-11-13 DIAGNOSIS — R29898 Other symptoms and signs involving the musculoskeletal system: Secondary | ICD-10-CM | POA: Diagnosis not present

## 2018-11-14 DIAGNOSIS — R2689 Other abnormalities of gait and mobility: Secondary | ICD-10-CM | POA: Diagnosis not present

## 2018-11-19 DIAGNOSIS — R2689 Other abnormalities of gait and mobility: Secondary | ICD-10-CM | POA: Diagnosis not present

## 2018-11-21 DIAGNOSIS — R2689 Other abnormalities of gait and mobility: Secondary | ICD-10-CM | POA: Diagnosis not present

## 2018-11-22 DIAGNOSIS — R29898 Other symptoms and signs involving the musculoskeletal system: Secondary | ICD-10-CM | POA: Diagnosis not present

## 2018-11-22 DIAGNOSIS — M25642 Stiffness of left hand, not elsewhere classified: Secondary | ICD-10-CM | POA: Diagnosis not present

## 2018-11-26 DIAGNOSIS — R2689 Other abnormalities of gait and mobility: Secondary | ICD-10-CM | POA: Diagnosis not present

## 2018-11-27 DIAGNOSIS — G5602 Carpal tunnel syndrome, left upper limb: Secondary | ICD-10-CM | POA: Diagnosis not present

## 2018-11-27 DIAGNOSIS — M25632 Stiffness of left wrist, not elsewhere classified: Secondary | ICD-10-CM | POA: Diagnosis not present

## 2018-11-27 DIAGNOSIS — M25642 Stiffness of left hand, not elsewhere classified: Secondary | ICD-10-CM | POA: Diagnosis not present

## 2018-11-27 DIAGNOSIS — R29898 Other symptoms and signs involving the musculoskeletal system: Secondary | ICD-10-CM | POA: Diagnosis not present

## 2018-12-02 DIAGNOSIS — G4733 Obstructive sleep apnea (adult) (pediatric): Secondary | ICD-10-CM | POA: Diagnosis not present

## 2018-12-02 DIAGNOSIS — G629 Polyneuropathy, unspecified: Secondary | ICD-10-CM | POA: Diagnosis not present

## 2018-12-02 DIAGNOSIS — E559 Vitamin D deficiency, unspecified: Secondary | ICD-10-CM | POA: Diagnosis not present

## 2018-12-02 DIAGNOSIS — M5412 Radiculopathy, cervical region: Secondary | ICD-10-CM | POA: Diagnosis not present

## 2018-12-02 DIAGNOSIS — F5101 Primary insomnia: Secondary | ICD-10-CM | POA: Diagnosis not present

## 2018-12-02 DIAGNOSIS — I10 Essential (primary) hypertension: Secondary | ICD-10-CM | POA: Diagnosis not present

## 2018-12-02 DIAGNOSIS — G4719 Other hypersomnia: Secondary | ICD-10-CM | POA: Diagnosis not present

## 2018-12-02 DIAGNOSIS — F17291 Nicotine dependence, other tobacco product, in remission: Secondary | ICD-10-CM | POA: Diagnosis not present

## 2018-12-02 DIAGNOSIS — E7800 Pure hypercholesterolemia, unspecified: Secondary | ICD-10-CM | POA: Diagnosis not present

## 2018-12-02 DIAGNOSIS — I493 Ventricular premature depolarization: Secondary | ICD-10-CM | POA: Diagnosis not present

## 2018-12-02 DIAGNOSIS — I739 Peripheral vascular disease, unspecified: Secondary | ICD-10-CM | POA: Diagnosis not present

## 2018-12-02 DIAGNOSIS — R7303 Prediabetes: Secondary | ICD-10-CM | POA: Diagnosis not present

## 2018-12-03 DIAGNOSIS — R7303 Prediabetes: Secondary | ICD-10-CM | POA: Diagnosis not present

## 2018-12-03 DIAGNOSIS — M5412 Radiculopathy, cervical region: Secondary | ICD-10-CM | POA: Diagnosis not present

## 2018-12-03 DIAGNOSIS — E559 Vitamin D deficiency, unspecified: Secondary | ICD-10-CM | POA: Diagnosis not present

## 2018-12-03 DIAGNOSIS — G4733 Obstructive sleep apnea (adult) (pediatric): Secondary | ICD-10-CM | POA: Diagnosis not present

## 2018-12-03 DIAGNOSIS — R2689 Other abnormalities of gait and mobility: Secondary | ICD-10-CM | POA: Diagnosis not present

## 2018-12-03 DIAGNOSIS — I739 Peripheral vascular disease, unspecified: Secondary | ICD-10-CM | POA: Diagnosis not present

## 2018-12-03 DIAGNOSIS — G4719 Other hypersomnia: Secondary | ICD-10-CM | POA: Diagnosis not present

## 2018-12-03 DIAGNOSIS — G629 Polyneuropathy, unspecified: Secondary | ICD-10-CM | POA: Diagnosis not present

## 2018-12-03 DIAGNOSIS — I10 Essential (primary) hypertension: Secondary | ICD-10-CM | POA: Diagnosis not present

## 2018-12-03 DIAGNOSIS — Z125 Encounter for screening for malignant neoplasm of prostate: Secondary | ICD-10-CM | POA: Diagnosis not present

## 2018-12-03 DIAGNOSIS — I493 Ventricular premature depolarization: Secondary | ICD-10-CM | POA: Diagnosis not present

## 2018-12-03 DIAGNOSIS — E7800 Pure hypercholesterolemia, unspecified: Secondary | ICD-10-CM | POA: Diagnosis not present

## 2018-12-04 DIAGNOSIS — R29898 Other symptoms and signs involving the musculoskeletal system: Secondary | ICD-10-CM | POA: Diagnosis not present

## 2018-12-04 DIAGNOSIS — M25642 Stiffness of left hand, not elsewhere classified: Secondary | ICD-10-CM | POA: Diagnosis not present

## 2018-12-04 DIAGNOSIS — M25632 Stiffness of left wrist, not elsewhere classified: Secondary | ICD-10-CM | POA: Diagnosis not present

## 2018-12-05 DIAGNOSIS — R2689 Other abnormalities of gait and mobility: Secondary | ICD-10-CM | POA: Diagnosis not present

## 2018-12-10 DIAGNOSIS — R2689 Other abnormalities of gait and mobility: Secondary | ICD-10-CM | POA: Diagnosis not present

## 2018-12-11 DIAGNOSIS — M25632 Stiffness of left wrist, not elsewhere classified: Secondary | ICD-10-CM | POA: Diagnosis not present

## 2018-12-11 DIAGNOSIS — G5602 Carpal tunnel syndrome, left upper limb: Secondary | ICD-10-CM | POA: Diagnosis not present

## 2018-12-11 DIAGNOSIS — M25642 Stiffness of left hand, not elsewhere classified: Secondary | ICD-10-CM | POA: Diagnosis not present

## 2018-12-11 DIAGNOSIS — R29898 Other symptoms and signs involving the musculoskeletal system: Secondary | ICD-10-CM | POA: Diagnosis not present

## 2018-12-13 DIAGNOSIS — R2689 Other abnormalities of gait and mobility: Secondary | ICD-10-CM | POA: Diagnosis not present

## 2018-12-17 DIAGNOSIS — R2689 Other abnormalities of gait and mobility: Secondary | ICD-10-CM | POA: Diagnosis not present

## 2018-12-18 DIAGNOSIS — H0102B Squamous blepharitis left eye, upper and lower eyelids: Secondary | ICD-10-CM | POA: Diagnosis not present

## 2018-12-18 DIAGNOSIS — H43813 Vitreous degeneration, bilateral: Secondary | ICD-10-CM | POA: Diagnosis not present

## 2018-12-18 DIAGNOSIS — M25642 Stiffness of left hand, not elsewhere classified: Secondary | ICD-10-CM | POA: Diagnosis not present

## 2018-12-18 DIAGNOSIS — H2513 Age-related nuclear cataract, bilateral: Secondary | ICD-10-CM | POA: Diagnosis not present

## 2018-12-18 DIAGNOSIS — H0102A Squamous blepharitis right eye, upper and lower eyelids: Secondary | ICD-10-CM | POA: Diagnosis not present

## 2018-12-18 DIAGNOSIS — R29898 Other symptoms and signs involving the musculoskeletal system: Secondary | ICD-10-CM | POA: Diagnosis not present

## 2018-12-18 DIAGNOSIS — H47012 Ischemic optic neuropathy, left eye: Secondary | ICD-10-CM | POA: Diagnosis not present

## 2018-12-18 DIAGNOSIS — H02423 Myogenic ptosis of bilateral eyelids: Secondary | ICD-10-CM | POA: Diagnosis not present

## 2018-12-24 DIAGNOSIS — E7800 Pure hypercholesterolemia, unspecified: Secondary | ICD-10-CM | POA: Diagnosis not present

## 2018-12-24 DIAGNOSIS — M2550 Pain in unspecified joint: Secondary | ICD-10-CM | POA: Diagnosis not present

## 2018-12-24 DIAGNOSIS — I10 Essential (primary) hypertension: Secondary | ICD-10-CM | POA: Diagnosis not present

## 2018-12-24 DIAGNOSIS — Z1159 Encounter for screening for other viral diseases: Secondary | ICD-10-CM | POA: Diagnosis not present

## 2018-12-24 DIAGNOSIS — E781 Pure hyperglyceridemia: Secondary | ICD-10-CM | POA: Diagnosis not present

## 2018-12-25 DIAGNOSIS — M791 Myalgia, unspecified site: Secondary | ICD-10-CM | POA: Diagnosis not present

## 2018-12-25 DIAGNOSIS — M2550 Pain in unspecified joint: Secondary | ICD-10-CM | POA: Diagnosis not present

## 2018-12-26 DIAGNOSIS — D72829 Elevated white blood cell count, unspecified: Secondary | ICD-10-CM | POA: Diagnosis not present

## 2018-12-26 DIAGNOSIS — E782 Mixed hyperlipidemia: Secondary | ICD-10-CM | POA: Diagnosis not present

## 2018-12-26 DIAGNOSIS — M25642 Stiffness of left hand, not elsewhere classified: Secondary | ICD-10-CM | POA: Diagnosis not present

## 2018-12-26 DIAGNOSIS — E559 Vitamin D deficiency, unspecified: Secondary | ICD-10-CM | POA: Diagnosis not present

## 2018-12-26 DIAGNOSIS — Z7289 Other problems related to lifestyle: Secondary | ICD-10-CM | POA: Diagnosis not present

## 2018-12-26 DIAGNOSIS — K219 Gastro-esophageal reflux disease without esophagitis: Secondary | ICD-10-CM | POA: Diagnosis not present

## 2018-12-26 DIAGNOSIS — M2550 Pain in unspecified joint: Secondary | ICD-10-CM | POA: Diagnosis not present

## 2018-12-26 DIAGNOSIS — F17210 Nicotine dependence, cigarettes, uncomplicated: Secondary | ICD-10-CM | POA: Diagnosis not present

## 2018-12-26 DIAGNOSIS — I10 Essential (primary) hypertension: Secondary | ICD-10-CM | POA: Diagnosis not present

## 2018-12-30 DIAGNOSIS — R29898 Other symptoms and signs involving the musculoskeletal system: Secondary | ICD-10-CM | POA: Diagnosis not present

## 2018-12-30 DIAGNOSIS — M25632 Stiffness of left wrist, not elsewhere classified: Secondary | ICD-10-CM | POA: Diagnosis not present

## 2018-12-30 DIAGNOSIS — M25642 Stiffness of left hand, not elsewhere classified: Secondary | ICD-10-CM | POA: Diagnosis not present

## 2019-01-02 DIAGNOSIS — M2550 Pain in unspecified joint: Secondary | ICD-10-CM | POA: Diagnosis not present

## 2019-01-02 DIAGNOSIS — Z7289 Other problems related to lifestyle: Secondary | ICD-10-CM | POA: Diagnosis not present

## 2019-01-02 DIAGNOSIS — D72829 Elevated white blood cell count, unspecified: Secondary | ICD-10-CM | POA: Diagnosis not present

## 2019-01-02 DIAGNOSIS — F17210 Nicotine dependence, cigarettes, uncomplicated: Secondary | ICD-10-CM | POA: Diagnosis not present

## 2019-01-02 DIAGNOSIS — E559 Vitamin D deficiency, unspecified: Secondary | ICD-10-CM | POA: Diagnosis not present

## 2019-01-02 DIAGNOSIS — K219 Gastro-esophageal reflux disease without esophagitis: Secondary | ICD-10-CM | POA: Diagnosis not present

## 2019-01-02 DIAGNOSIS — E782 Mixed hyperlipidemia: Secondary | ICD-10-CM | POA: Diagnosis not present

## 2019-01-02 DIAGNOSIS — I10 Essential (primary) hypertension: Secondary | ICD-10-CM | POA: Diagnosis not present

## 2019-01-07 DIAGNOSIS — M25642 Stiffness of left hand, not elsewhere classified: Secondary | ICD-10-CM | POA: Diagnosis not present

## 2019-01-07 DIAGNOSIS — R29898 Other symptoms and signs involving the musculoskeletal system: Secondary | ICD-10-CM | POA: Diagnosis not present

## 2019-01-07 DIAGNOSIS — M25632 Stiffness of left wrist, not elsewhere classified: Secondary | ICD-10-CM | POA: Diagnosis not present

## 2019-01-21 DIAGNOSIS — M25642 Stiffness of left hand, not elsewhere classified: Secondary | ICD-10-CM | POA: Diagnosis not present

## 2019-01-21 DIAGNOSIS — R29898 Other symptoms and signs involving the musculoskeletal system: Secondary | ICD-10-CM | POA: Diagnosis not present

## 2019-01-28 DIAGNOSIS — M25642 Stiffness of left hand, not elsewhere classified: Secondary | ICD-10-CM | POA: Diagnosis not present

## 2019-01-28 DIAGNOSIS — R29898 Other symptoms and signs involving the musculoskeletal system: Secondary | ICD-10-CM | POA: Diagnosis not present

## 2019-02-05 DIAGNOSIS — R29898 Other symptoms and signs involving the musculoskeletal system: Secondary | ICD-10-CM | POA: Diagnosis not present

## 2019-02-05 DIAGNOSIS — M25642 Stiffness of left hand, not elsewhere classified: Secondary | ICD-10-CM | POA: Diagnosis not present

## 2019-02-12 DIAGNOSIS — M25642 Stiffness of left hand, not elsewhere classified: Secondary | ICD-10-CM | POA: Diagnosis not present

## 2019-02-12 DIAGNOSIS — R29898 Other symptoms and signs involving the musculoskeletal system: Secondary | ICD-10-CM | POA: Diagnosis not present

## 2019-02-13 DIAGNOSIS — H0288A Meibomian gland dysfunction right eye, upper and lower eyelids: Secondary | ICD-10-CM | POA: Diagnosis not present

## 2019-02-13 DIAGNOSIS — H0102A Squamous blepharitis right eye, upper and lower eyelids: Secondary | ICD-10-CM | POA: Diagnosis not present

## 2019-02-13 DIAGNOSIS — H1859 Other hereditary corneal dystrophies: Secondary | ICD-10-CM | POA: Diagnosis not present

## 2019-02-13 DIAGNOSIS — H2513 Age-related nuclear cataract, bilateral: Secondary | ICD-10-CM | POA: Diagnosis not present

## 2019-02-13 DIAGNOSIS — S0501XA Injury of conjunctiva and corneal abrasion without foreign body, right eye, initial encounter: Secondary | ICD-10-CM | POA: Diagnosis not present

## 2019-02-13 DIAGNOSIS — H02423 Myogenic ptosis of bilateral eyelids: Secondary | ICD-10-CM | POA: Diagnosis not present

## 2019-02-13 DIAGNOSIS — H0288B Meibomian gland dysfunction left eye, upper and lower eyelids: Secondary | ICD-10-CM | POA: Diagnosis not present

## 2019-02-13 DIAGNOSIS — H0102B Squamous blepharitis left eye, upper and lower eyelids: Secondary | ICD-10-CM | POA: Diagnosis not present

## 2019-02-14 DIAGNOSIS — H0288A Meibomian gland dysfunction right eye, upper and lower eyelids: Secondary | ICD-10-CM | POA: Diagnosis not present

## 2019-02-14 DIAGNOSIS — H0102A Squamous blepharitis right eye, upper and lower eyelids: Secondary | ICD-10-CM | POA: Diagnosis not present

## 2019-02-14 DIAGNOSIS — H0102B Squamous blepharitis left eye, upper and lower eyelids: Secondary | ICD-10-CM | POA: Diagnosis not present

## 2019-02-14 DIAGNOSIS — H0288B Meibomian gland dysfunction left eye, upper and lower eyelids: Secondary | ICD-10-CM | POA: Diagnosis not present

## 2019-02-14 DIAGNOSIS — H2513 Age-related nuclear cataract, bilateral: Secondary | ICD-10-CM | POA: Diagnosis not present

## 2019-02-14 DIAGNOSIS — S0501XD Injury of conjunctiva and corneal abrasion without foreign body, right eye, subsequent encounter: Secondary | ICD-10-CM | POA: Diagnosis not present

## 2019-02-16 DIAGNOSIS — E782 Mixed hyperlipidemia: Secondary | ICD-10-CM | POA: Diagnosis not present

## 2019-02-16 DIAGNOSIS — J449 Chronic obstructive pulmonary disease, unspecified: Secondary | ICD-10-CM | POA: Diagnosis not present

## 2019-02-16 DIAGNOSIS — J439 Emphysema, unspecified: Secondary | ICD-10-CM | POA: Diagnosis not present

## 2019-02-16 DIAGNOSIS — I10 Essential (primary) hypertension: Secondary | ICD-10-CM | POA: Diagnosis not present

## 2019-02-17 DIAGNOSIS — H0102A Squamous blepharitis right eye, upper and lower eyelids: Secondary | ICD-10-CM | POA: Diagnosis not present

## 2019-02-17 DIAGNOSIS — H0288B Meibomian gland dysfunction left eye, upper and lower eyelids: Secondary | ICD-10-CM | POA: Diagnosis not present

## 2019-02-17 DIAGNOSIS — H0102B Squamous blepharitis left eye, upper and lower eyelids: Secondary | ICD-10-CM | POA: Diagnosis not present

## 2019-02-17 DIAGNOSIS — S0501XS Injury of conjunctiva and corneal abrasion without foreign body, right eye, sequela: Secondary | ICD-10-CM | POA: Diagnosis not present

## 2019-02-17 DIAGNOSIS — H2513 Age-related nuclear cataract, bilateral: Secondary | ICD-10-CM | POA: Diagnosis not present

## 2019-02-17 DIAGNOSIS — H04123 Dry eye syndrome of bilateral lacrimal glands: Secondary | ICD-10-CM | POA: Diagnosis not present

## 2019-02-17 DIAGNOSIS — H02423 Myogenic ptosis of bilateral eyelids: Secondary | ICD-10-CM | POA: Diagnosis not present

## 2019-02-17 DIAGNOSIS — H0288A Meibomian gland dysfunction right eye, upper and lower eyelids: Secondary | ICD-10-CM | POA: Diagnosis not present

## 2019-02-20 ENCOUNTER — Encounter: Payer: Self-pay | Admitting: Neurology

## 2019-02-21 DIAGNOSIS — M4323 Fusion of spine, cervicothoracic region: Secondary | ICD-10-CM | POA: Diagnosis not present

## 2019-02-21 DIAGNOSIS — M542 Cervicalgia: Secondary | ICD-10-CM | POA: Diagnosis not present

## 2019-02-24 ENCOUNTER — Other Ambulatory Visit: Payer: Self-pay | Admitting: *Deleted

## 2019-02-24 DIAGNOSIS — E782 Mixed hyperlipidemia: Secondary | ICD-10-CM | POA: Diagnosis not present

## 2019-02-24 DIAGNOSIS — J439 Emphysema, unspecified: Secondary | ICD-10-CM | POA: Diagnosis not present

## 2019-02-24 DIAGNOSIS — J449 Chronic obstructive pulmonary disease, unspecified: Secondary | ICD-10-CM | POA: Diagnosis not present

## 2019-02-24 DIAGNOSIS — G5602 Carpal tunnel syndrome, left upper limb: Secondary | ICD-10-CM

## 2019-02-24 DIAGNOSIS — I10 Essential (primary) hypertension: Secondary | ICD-10-CM | POA: Diagnosis not present

## 2019-03-03 DIAGNOSIS — F17291 Nicotine dependence, other tobacco product, in remission: Secondary | ICD-10-CM | POA: Diagnosis not present

## 2019-03-03 DIAGNOSIS — G629 Polyneuropathy, unspecified: Secondary | ICD-10-CM | POA: Diagnosis not present

## 2019-03-03 DIAGNOSIS — G4710 Hypersomnia, unspecified: Secondary | ICD-10-CM | POA: Diagnosis not present

## 2019-03-03 DIAGNOSIS — E559 Vitamin D deficiency, unspecified: Secondary | ICD-10-CM | POA: Diagnosis not present

## 2019-03-03 DIAGNOSIS — E782 Mixed hyperlipidemia: Secondary | ICD-10-CM | POA: Diagnosis not present

## 2019-03-03 DIAGNOSIS — I10 Essential (primary) hypertension: Secondary | ICD-10-CM | POA: Diagnosis not present

## 2019-03-03 DIAGNOSIS — G4733 Obstructive sleep apnea (adult) (pediatric): Secondary | ICD-10-CM | POA: Diagnosis not present

## 2019-04-01 ENCOUNTER — Ambulatory Visit (INDEPENDENT_AMBULATORY_CARE_PROVIDER_SITE_OTHER): Payer: Medicare Other | Admitting: Neurology

## 2019-04-01 ENCOUNTER — Other Ambulatory Visit: Payer: Self-pay

## 2019-04-01 DIAGNOSIS — G5602 Carpal tunnel syndrome, left upper limb: Secondary | ICD-10-CM | POA: Diagnosis not present

## 2019-04-01 DIAGNOSIS — G5603 Carpal tunnel syndrome, bilateral upper limbs: Secondary | ICD-10-CM

## 2019-04-01 DIAGNOSIS — G5632 Lesion of radial nerve, left upper limb: Secondary | ICD-10-CM

## 2019-04-01 DIAGNOSIS — G5620 Lesion of ulnar nerve, unspecified upper limb: Secondary | ICD-10-CM

## 2019-04-01 NOTE — Procedures (Signed)
[facility]  [place], [place]  [place], [place] [address] Tel: [phone] Fax:  [phone] Test Date:  04/01/2019  Patient: Richard Lynch DOB: 1951-01-10 Physician: [name], DO  Sex: Male Height: 5\' 11"  Ref Phys: Charlotte Crumb, MD  ID#: [id number] Temp: 33.0C Technician:    Patient Complaints: This is a 68 year old man with history of left ulnar decompression at the elbow, carpal tunnel release on the left, and interosseous nerve transfer referred for evaluation of bilateral hand paresthesias and weakness on the left.  NCV & EMG Findings: Extensive electrodiagnostic testing of the left upper extremity and additional studies of the right shows:  1. Left median sensory response shows prolonged latency (4.2 ms) and low-normal amplitude.  Left ulnar sensory response shows reduced amplitude (4.5 V).  Right median sensory response is absent.  Right ulnar and radial sensory responses are within normal limits. 2. Bilateral median motor response shows prolonged latencies (L5.4, R7.4 ms) and reduced amplitude (L1.6, R4.5 mV).  Left radial motor response is essentially absent.  Left ulnar motor response shows prolonged latency (3.7 ms), reduced amplitude (3.4 mV), and decreased conduction velocity (A Elbow-B Elbow, 38 m/s).  Right ulnar motor response shows normal amplitude and latency, however there is slowed conduction velocity across the elbow (A Elbow-B Elbow, 43 m/s).   3. Chronic motor axonal loss changes are seen affecting nearly all the tested muscles of the upper extremities, sparing the biceps and deltoid muscles bilaterally and proximal radial muscles on the left.  These findings are more prominent in the left upper extremity where there is significantly reduced recruitment pattern in the distal hand muscles and muscle atrophy.  Active denervation is isolated to the left first dorsal interosseous muscle.   Impression: This is a complex study of the upper  extremities.  Findings are as follows:  1. Bilateral median neuropathy at or distal to the wrist (severe), consistent with a clinical diagnosis of carpal tunnel syndrome.   2. Bilateral ulnar neuropathy with slowing across the elbow, mild on the right and severe on the left. 3. Chronic left posterior interosseous neuropathy, very severe.  4. Chronic C7-8 radiculopathy affecting bilateral upper extremities, moderate in degree electrically.   ___________________________ [name], DO    Nerve Conduction Studies Anti Sensory Summary Table   Site NR Peak (ms) Norm Peak (ms) P-T Amp (V) Norm P-T Amp  Left Median Anti Sensory (2nd Digit)  33C  Wrist    4.2 <3.8 11.1 >10  Right Median Anti Sensory (2nd Digit)  Wrist NR  <3.8  >10  Left Radial Anti Sensory (Base 1st Digit)  33C  Wrist    2.5 <2.8 13.4 >10  Right Radial Anti Sensory (Base 1st Digit)  33C  Wrist    2.5 <2.8 13.9 >10  Left Ulnar Anti Sensory (5th Digit)  33C  Wrist    3.2 <3.2 4.5 >5  Right Ulnar Anti Sensory (5th Digit)  33C  Wrist    3.0 <3.2 10.2 >5   Motor Summary Table   Site NR Onset (ms) Norm Onset (ms) O-P Amp (mV) Norm O-P Amp Site1 Site2 Delta-0 (ms) Dist (cm) Vel (m/s) Norm Vel (m/s)  Left Median Motor (Abd Poll Brev)  33C  Wrist    5.4 <4.0 1.6 >5 Elbow Wrist 6.8 34.0 50 >50  Elbow    12.2  1.6         Right Median Motor (Abd Poll Brev)  33C  Wrist    7.4 <  4.0 4.5 >5 Elbow Wrist 6.0 31.0 52 >50  Elbow    13.4  4.0         Left Radial Motor (Ext Ind Prop)  33C  7cm    2.0 <3.1 0.9 >5 Up Arm 7cm  0.0  >50  Up Arm NR     Axilla Up Arm  0.0    Axilla NR            Site 4 NR            Left Ulnar Motor (Abd Dig Minimi)  33C  Wrist    3.7 <3.1 3.4 >7 B Elbow Wrist 5.1 26.0 51 >50  B Elbow    8.8  3.2  A Elbow B Elbow 2.6 10.0 38 >50  A Elbow    11.4  3.1         Right Ulnar Motor (Abd Dig Minimi)  33C  Wrist    2.4 <3.1 7.2 >7 B Elbow Wrist 4.5 25.0 56 >50  B Elbow    6.9  6.9  A Elbow B  Elbow 2.3 10.0 43 >50  A Elbow    9.2  6.5          EMG    Side Muscle Ins Act Fibs Psw Fasc Number Recrt Dur Dur. Amp Amp. Poly Poly. Comment  Right 1stDorInt Nml Nml Nml Nml 1- Rapid Few 1+ Few 1+ Few 1+ N/A  Right Abd Poll Brev Nml Nml Nml Nml 2- Rapid Many 1+ Many 1+ Many 1+ N/A  Right Ext Indicis Nml Nml Nml Nml Nml Nml Nml Nml Nml Nml Nml Nml N/A  Right PronatorTeres Nml Nml Nml Nml 2- Rapid Many 1+ Many 1+ Many 1+ N/A  Right ABD Dig Min Nml Nml Nml Nml 2- Rapid Many 1+ Many 1+ Many 1+ N/A  Right FlexCarpiUln Nml Nml Nml Nml 1- Rapid Some 1+ Some 1+ Some 1+ N/A  Right Biceps Nml Nml Nml Nml Nml Nml Nml Nml Nml Nml Nml Nml N/A  Right Triceps Nml Nml Nml Nml 2- Rapid All 1+ All 1+ All 1+ N/A  Right Deltoid Nml Nml Nml Nml Nml Nml Nml Nml Nml Nml Nml Nml N/A  Left FlexPolLong Nml Nml Nml Nml Nml Nml Nml Nml Nml Nml Nml Nml N/A  Left 1stDorInt Nml 1+ Nml Nml SMU Rapid All 1+ All 1+ All 1+ ATR  Left Abd Poll Brev Nml Nml Nml Nml SMU Rapid All 1+ All 1+ All 1+ ATR  Left Ext Indicis Nml Nml Nml Nml SMU Rapid All 1+ All 1+ All 1+ ATR  Left PronatorTeres Nml Nml Nml Nml 2- Rapid Most 1+ Most 1+ Nml Nml N/A  Left Biceps Nml Nml Nml Nml Nml Nml Nml Nml Nml Nml Nml Nml N/A  Left Triceps Nml Nml Nml Nml 2- Rapid Some 1+ Some 1+ Nml Nml N/A  Left Deltoid Nml Nml Nml Nml Nml Nml Nml Nml Nml Nml Nml Nml N/A  Left ABD Dig Min Nml Nml Nml Nml SMU Rapid All 1+ All 1+ All 1+ N/A  Left FlexCarpiUln Nml Nml Nml Nml SMU Rapid All 1+ All 1+ All 1+ N/A  Left ExtDigComm Nml Nml Nml Nml 3- Rapid Most 1+ Most 1+ Most 1+ N/A  Left BrachioRad Nml Nml Nml Nml Nml Nml Nml Nml Nml Nml Nml Nml N/A     Waveforms:

## 2019-04-03 DIAGNOSIS — G5602 Carpal tunnel syndrome, left upper limb: Secondary | ICD-10-CM | POA: Diagnosis not present

## 2019-04-03 DIAGNOSIS — G5632 Lesion of radial nerve, left upper limb: Secondary | ICD-10-CM | POA: Diagnosis not present

## 2019-04-03 DIAGNOSIS — G5622 Lesion of ulnar nerve, left upper limb: Secondary | ICD-10-CM | POA: Diagnosis not present

## 2019-04-17 ENCOUNTER — Other Ambulatory Visit (INDEPENDENT_AMBULATORY_CARE_PROVIDER_SITE_OTHER): Payer: Medicare Other

## 2019-04-17 ENCOUNTER — Encounter: Payer: Self-pay | Admitting: Neurology

## 2019-04-17 ENCOUNTER — Ambulatory Visit (INDEPENDENT_AMBULATORY_CARE_PROVIDER_SITE_OTHER): Payer: Medicare Other | Admitting: Neurology

## 2019-04-17 ENCOUNTER — Other Ambulatory Visit: Payer: Self-pay

## 2019-04-17 VITALS — BP 114/62 | HR 76 | Ht 70.0 in | Wt 187.0 lb

## 2019-04-17 DIAGNOSIS — R531 Weakness: Secondary | ICD-10-CM | POA: Diagnosis not present

## 2019-04-17 DIAGNOSIS — D649 Anemia, unspecified: Secondary | ICD-10-CM

## 2019-04-17 DIAGNOSIS — G629 Polyneuropathy, unspecified: Secondary | ICD-10-CM

## 2019-04-17 DIAGNOSIS — G610 Guillain-Barre syndrome: Secondary | ICD-10-CM

## 2019-04-17 DIAGNOSIS — R202 Paresthesia of skin: Secondary | ICD-10-CM | POA: Diagnosis not present

## 2019-04-17 DIAGNOSIS — M5410 Radiculopathy, site unspecified: Secondary | ICD-10-CM

## 2019-04-17 NOTE — Progress Notes (Signed)
[facility] Clinic Note - Initial Visit   Date: 04/17/19  Richard Lynch MRN: [id number] DOB: 24-Dec-1950   Dear Dr. Burney Gauze:  Thank you for your kind referral of Richard Lynch for consultation of left hand weakness and paresthesias. Although his history is well known to you, please allow Richard Lynch to reiterate it for the purpose of our medical record. The patient was accompanied to the clinic by self.    History of Present Illness: Richard Lynch is a 68 y.o. left-handed male with hypertension, hyperlipidemia, and tobacco use presenting for evaluation of left hand weakness and paresthesias.   Starting in 2016, he began having bilateral shoulder pain and hand tingling which steadily progressed over the years.  In February 2019, he underwent cervical decompression and fusion at C5-6 to T1-2 in [place].  He reports marked improvement in shoulder pain and hand tingling.   Over the course of the next few months, he developed clawing of the left hand, and difficulty with extending the fingers.  In October 2019, he underwent electrodiagnostic testing performed by [name] at [place] which was interpreted as severe subacute left C8 radiculopathy, left median neuropathy at the wrist, and possible left ulnar at the wrist.   Based on these findings, he underwent left carpal tunnel release and Guyon canal release as well as anterior interosseous nerve transfer in December 2019.  Unfortunately, he has had not had any improvement and continues to have persistent numbness/tingling and weakness, especially with finger extension and grip on the left hand.  For this reason, he underwent repeat electrodiagnostic testing on 04/01/2019 performed by myself which shows bilateral severe carpal tunnel syndrome, bilateral ulnar neuropathy (worse on the left), and left posterior interosseous neuropathy (very severe), along with chronic C7-8 radiculopathy bilaterally.  Upon my  review of this EMG performed on 07/24/2018 at [facility] orthopedic, there is low motor and sensory amplitudes involving the median, ulnar, and radial nerves including active on chronic denervation in the distal hand muscles.  Of note, there is conduction block of the left radial nerve at the spiral groove.   He also reports about a 2-year history of bilateral feet numbness below the level of the ankle.  Symptoms are constant.  He continues to have constant numbness over the fingers on both hands.  His grip is very weak on the left and he is starting to experience similar symptoms in the right hand.  He has a 20-year history of drinking 2-3 alcoholic beverages nightly.  He also has a 42-year history of smoking and is down to half a pack daily.  He previously had history of prediabetes which was corrected with lifestyle modifications.  He reports that his mother had neuropathy and similar symptoms late in her life.  She was also diabetic.  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     Past Medical History:  Diagnosis Date  . Constipation due to opioid therapy 12/22/2015  . Emphysema lung ([place])   . Gait abnormality 02/20/2017  . GERD (gastroesophageal reflux disease)   . High cholesterol   . Hypertension   . OSA (obstructive sleep apnea)    on CPAP  . Primary localized osteoarthritis of right knee 12/08/2015  . Septic olecranon bursitis 06/2015   MSSA    Past Surgical History:  Procedure Laterality Date  . COLONOSCOPY W/ BIOPSIES AND POLYPECTOMY    . ELBOW SURGERY  08/2015   bilateral  . EYE SURGERY     '  repair of tear to left eye from pliers'  . HAND SURGERY Left 08/2018   Carpal Tunnel Surgery- [place]  . HERNIA REPAIR  07/2010  . KNEE SURGERY     3 times on both knee's each  . MULTIPLE TOOTH EXTRACTIONS    . SHOULDER ARTHROSCOPY    . TOTAL HIP ARTHROPLASTY  08/2010  . TOTAL KNEE ARTHROPLASTY Right 12/20/2015  . TOTAL KNEE ARTHROPLASTY Right 12/20/2015   Procedure:  TOTAL KNEE ARTHROPLASTY;  Surgeon: [name], MD;  Location: [place];  Service: Orthopedics;  Laterality: Right;     Medications:  Outpatient Encounter Medications as of 04/17/2019  Medication Sig Note  . amphetamine-dextroamphetamine (ADDERALL) 20 MG tablet Take 20 mg by mouth daily. Takes 2.5 tabs qd prn   . Ascorbic Acid (VITAMIN C) 100 MG tablet Take 100 mg by mouth daily.   Marland Kitchen aspirin 81 MG tablet Take 81 mg by mouth daily.   Marland Kitchen atenolol (TENORMIN) 25 MG tablet Take 25 mg by mouth daily.  11/02/2015: Received from: External Pharmacy Received Sig:   . buPROPion (ZYBAN) 150 MG 12 hr tablet Take 150 mg by mouth 2 (two) times daily.   . candesartan (ATACAND) 32 MG tablet Take 32 mg by mouth daily.  11/02/2015: Received from: External Pharmacy Received Sig:   . celecoxib (CELEBREX) 200 MG capsule 1 tab po q day with food for pain and  swelling   . chlorthalidone (HYGROTON) 25 MG tablet Take 25 mg by mouth daily.  11/02/2015: Received from: External Pharmacy Received Sig:   . Cholecalciferol (VITAMIN D3 PO) Take 2,000 Units by mouth 2 (two) times daily.   . Coenzyme Q10 (COQ-10 PO) Take 300 mg by mouth daily.   . fenofibrate 160 MG tablet Take 160 mg by mouth daily.  11/02/2015: Received from: External Pharmacy Received Sig:   . Omega-3 Fatty Acids (FISH OIL PO) Take 2,400 mg by mouth daily.   . valACYclovir (VALTREX) 500 MG tablet Take 500 mg by mouth daily.  11/02/2015: Received from: External Pharmacy Received Sig:   . vitamin B-12 (CYANOCOBALAMIN) 1000 MCG tablet Take 1,000 mcg by mouth daily.   . [DISCONTINUED] Calcium Carbonate (CALCIUM 600 PO) Take 1 tablet by mouth daily.   Marland Kitchen atorvastatin (LIPITOR) 40 MG tablet Take 40 mg by mouth daily.   . [DISCONTINUED] IRON PO Take 65 mg by mouth daily.   . [DISCONTINUED] LIVALO 2 MG TABS Take 2 mg by mouth daily.  11/02/2015: Received from: External Pharmacy Received Sig:   . [DISCONTINUED] vitamin E 400 UNIT capsule Take 400 Units by mouth daily.     No facility-administered encounter medications on file as of 04/17/2019.     Allergies: No Known Allergies  Family History: Family History  Problem Relation Age of Onset  . Emphysema Father   . Heart disease Father   . Heart disease Mother   . Colon cancer Mother   . Breast cancer Mother     Social History: Social History   Tobacco Use  . Smoking status: Current Some Day Smoker    Packs/day: 1.00    Types: Cigarettes    Start date: 01/24/1973  . Smokeless tobacco: Never Used  . Tobacco comment: Peak rate of 3ppd  Substance Use Topics  . Alcohol use: Yes    Alcohol/week: 0.0 standard drinks    Comment: 1-2 drinks per night  . Drug use: No   Social History   Social History Narrative   Originally from [place]. He has lived  in [place] as well. He moved to [place] [facility] [place] in [place].    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    He has bachelors degree in Business.    Has a dog currently. No bird or mold exposure.    Caffeine HDQ:QIWLNL- daily   Soda- sometimes   Left-handed    Review of Systems:  CONSTITUTIONAL: No fevers, chills, night sweats, or weight loss.   EYES: No visual changes or eye pain ENT: No hearing changes.  No history of nose bleeds.   RESPIRATORY: No cough, wheezing and shortness of breath.   CARDIOVASCULAR: Negative for chest pain, and palpitations.   GI: Negative for abdominal discomfort, blood in stools or black stools.  No recent change in bowel habits.   GU:  No history of incontinence.   MUSCLOSKELETAL: +history of joint pain or swelling.  No myalgias.   SKIN: Negative for lesions, rash, and itching.   HEMATOLOGY/ONCOLOGY: Negative for prolonged bleeding, bruising easily, and swollen nodes.  No history of cancer.   ENDOCRINE: Negative for cold or heat intolerance, polydipsia or goiter.   PSYCH:  No depression or anxiety symptoms.   NEURO: As Above.   Vital Signs:  BP 114/62   Pulse 76   Ht 5'  10" (1.778 m)   Wt 187 lb (84.8 kg)   SpO2 96%   BMI 26.83 kg/m    General Medical Exam:   General:  Well appearing, comfortable.   Eyes/ENT: see cranial nerve examination.   Neck:   No carotid bruits. Respiratory:  Clear to auscultation, good air entry bilaterally.   Cardiac:  Regular rate and rhythm, no murmur.   Extremities:  No deformities, edema, or skin discoloration.  Skin:  No rashes or lesions.  Neurological Exam: MENTAL STATUS including orientation to time, place, person, recent and remote memory, attention span and concentration, language, and fund of knowledge is normal.  Speech is not dysarthric.  CRANIAL NERVES: II:  No visual field defects.  Unremarkable fundi.   III-IV-VI: Pupils equal round and reactive to light.  Normal conjugate, extra-ocular eye movements in all directions of gaze.  No nystagmus.  No ptosis.   V:  Normal facial sensation.    VII:  Normal facial symmetry and movements.   VIII:  Normal hearing and vestibular function.   IX-X:  Normal palatal movement.   XI:  Normal shoulder shrug and head rotation.   XII:  Normal tongue strength and range of motion, no deviation or fasciculation.  MOTOR: Severe atrophy over the left distal forearm atrophy, left APB, and moderate atrophy in the intrinsic hand muscles.  There is a left claw hand deformity.  No fasciculations or abnormal movements.  No pronator drift  Upper Extremity:  Right  Left  Deltoid  5/5   5/5   Biceps  5/5   5/5   Triceps  5/5   5/5   Infraspinatus 5/5  5/5  Medial pectoralis 5/5  5/5  Wrist extensors  5/5   5/5   Wrist flexors  5/5   5/5   Finger extensors  5/5   2+/5   Finger flexors  5-/5   4+/5   Dorsal interossei  4+/5   3/5   Abductor pollicis  4-/5   1/5   Tone (Ashworth scale)  0  0   Lower Extremity:  Right  Left  Hip flexors  5/5   5/5   Hip  extensors  5/5   5/5   Adductor 5/5  5/5  Abductor 5/5  5/5  Knee flexors  5/5   5/5   Knee extensors  5/5   5/5   Dorsiflexors   5/5   5/5   Plantarflexors  5/5   5/5   Toe extensors  5/5   5/5   Toe flexors  5/5   5/5   Tone (Ashworth scale)  0  0   MSRs:  Right        Left                  brachioradialis 1+  2+  biceps 1+  1+  triceps 1+  1+  patellar tr  tr  ankle jerk 0  0  Hoffman no  no  plantar response up  down   SENSORY: Vibration is perceived strongest at the knees bilaterally, reduced to 50% at the ankles, and trace at the toes.  He has reduced vibration at the elbow and MCP bilaterally as compared to the knees.  Temperature is reduced over the dorsum of the feet bilaterally.  Pinprick is intact throughout.  Romberg sign is positive.   COORDINATION/GAIT: Normal finger-to- nose-finger.  Finger tapping is slowed on the left due to hand weakness.  He is unable to approximate the left thumb to the fifth digit or make an OK sign.  Able to rise from a chair without using arms.  Gait appears mildly antalgic, stable and unassisted.  He is able to perform stressed gait.  Very unsteady with tandem gait.    DATA: NCS/EMG of the legs 04/17/2019: The electrophysiologic findings are consistent with a sensorimotor polyneuropathy, demyelinating and axonal loss in type affecting the lower extremities.  The presence of sural-sparing suggests findings may be associated with a polyradiculoneuropathy, correlate clinically.  NCS/EMG of the arms 04/01/2019 This is a complex study of the upper extremities.  Findings are as follows:  1. Bilateral median neuropathy at or distal to the wrist (severe), consistent with a clinical diagnosis of carpal tunnel syndrome.   2. Bilateral ulnar neuropathy with slowing across the elbow, mild on the right and severe on the left. 3. Chronic left posterior interosseous neuropathy, very severe.  4. Chronic C7-8 radiculopathy affecting bilateral upper extremities, moderate in degree electrically.  MRI cervical spine wo contrast 07/04/2018: 1. C5-T3 posterior-lateral fusion with decompressive  laminectomy from C5-6 to T1-2. There is no arthrodesis by CT and the C5 and C6 lateral mass screws are loose. 2. Disc and facet degeneration causes multilevel foraminal impingement. Greatest foraminal impingement is seen on the left at C2-3, bilaterally at C3-4, right at C5-6, and right at T1-2. 3. Widely patent canal at levels of laminectomy. Noncompressive spinal stenosis seen at C3-4 and C4-5. 4. Septated fluid collection within the lower laminectomy defect without dural mass effect, best attributed to seroma.  MRI thoracic spine wo contrast 07/05/2018: Cervical and thoracic fusion and laminectomy. 15 mm fluid collection in the laminectomy bed. No cord compression or cord signal abnormality. Left paracentral disc protrusion T7-8 unchanged from the prior MRI.  IMPRESSION: Polyradiculoneuropathy affecting arms > legs as seen by demyelinating and axonal loss changes on EMG.  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  The results of his EMG findings from today was discussed.  I agree with Dr. Burney Gauze that he has more than entrapment neuropathy and his symptomology may be stemming from a more widespread polyradiculoneuropathy. He does not have diabetes, thyroid disease,  or known malignancy.  He does have some history of alcohol abuse which can be neurotoxic and lead to vitamin deficiencies which can worsen neuropathy.  I have encouraged him to try to cut back, if not, abstain from alcohol.  I will check labs as noted below and will plan to proceed with lumbar puncture going forward. Check ESR, vitamin B12, folate, vitamin B12, copper, SPEP with IFE  Further recommendations pending results.  Greater than 50% of this 50 minute visit was spent in counseling, explanation of diagnosis, planning of further management, and coordination of care.   Thank you for allowing me to participate in patient's care.  If I can answer any additional  questions, I would be pleased to do so.    Sincerely,     K. Posey Pronto, DO

## 2019-04-17 NOTE — Patient Instructions (Addendum)
Check labs today.  Nerve testing of the legs.  Encourage to stop drinking alcohol.   Please go to the 2nd floor of our building in [place] for labs. We will call you with the results.

## 2019-04-17 NOTE — Procedures (Signed)
[facility]  [address]  [place], [place] [address] Tel: [phone] Fax:  [phone] Test Date:  04/17/2019  Patient: Richard Lynch DOB: [birth year] Physician: [name], DO  Sex: Male Height: 5\' 11"  Ref Phys: [name], DO  ID#: [id number] Temp: 34.0C Technician:    Patient Complaints: This is a 68 year old man referred for evaluation of bilateral feet paresthesias.  NCV & EMG Findings: Extensive electrodiagnostic testing of the right lower extremity and additional studies of the left shows:  1. Bilateral superficial peroneal and left sural sensory responses are absent.  Right sural sensory responses within normal limits. 2. Peroneal motor response (EDB) on the right is reduced and shows mildly slowed conduction velocity.  Peroneal motor response (EDB) on the left is absent.  Bilateral peroneal motor responses at the tibialis anterior are within normal limits.  Left tibial motor response shows showed reduced amplitude (3.9 mV), temporal dispersion, and decreased conduction velocity (Knee-Ankle, 39 m/s).  Right tibial motor responses within normal limits. 3. Bilateral tibial F wave and H reflex studies show prolonged latencies. 4. Chronic motor axonal loss changes are isolated to the tibialis anterior and flexor digitorum longus muscles, without accompanied active denervation.  In particular, there is no evidence of fibrillation potentials in the lower lumbar paraspinal muscles.  Impression: The electrophysiologic findings are consistent with a sensorimotor polyneuropathy, demyelinating and axonal loss in type, affecting the lower extremities.  The presence of sural-sparing suggests findings may be associated with a polyradiculoneuropathy, correlate clinically.    ___________________________ [name], DO    Nerve Conduction Studies Anti Sensory Summary Table   Site NR Peak (ms) Norm Peak (ms) P-T Amp (V) Norm P-T Amp  Left Sup Peroneal Anti  Sensory (Ant Lat Mall)  34C  12 cm NR  <4.6  >3  Right Sup Peroneal Anti Sensory (Ant Lat Mall)  34C  12 cm NR  <4.6  >3  Left Sural Anti Sensory (Lat Mall)  34C  Calf NR  <4.6  >3  Right Sural Anti Sensory (Lat Mall)  34C  Calf    4.0 <4.6 6.0 >3  Site 2    3.9  4.9    Motor Summary Table   Site NR Onset (ms) Norm Onset (ms) O-P Amp (mV) Norm O-P Amp Site1 Site2 Delta-0 (ms) Dist (cm) Vel (m/s) Norm Vel (m/s)  Left Peroneal Motor (Ext Dig Brev)  34C  Ankle NR  <6.0  >2.5 B Fib Ankle  0.0  >40  B Fib NR     Poplt B Fib  0.0  >40  Poplt NR            Right Peroneal Motor (Ext Dig Brev)  34C  Ankle    3.9 <6.0 2.1 >2.5 B Fib Ankle 8.7 38.0 44 >40  B Fib    12.6  1.9  Poplt B Fib 2.6 10.0 38 >40  Poplt    15.2  1.7         Left Peroneal TA Motor (Tib Ant)  34C  Fib Head    2.9 <4.5 5.0 >3 Poplit Fib Head 1.6 10.0 63 >40  Poplit    4.5  5.0         Right Peroneal TA Motor (Tib Ant)  34C  Fib Head    3.0 <4.5 5.0 >3 Poplit Fib Head 1.7 10.0 59 >40  Poplit    4.7  5.0         Left Tibial Motor (  Abd Hall Brev)  34C  Ankle    4.7 <6.0 3.9 >4 Knee Ankle 10.9 42.0 39 >40  Knee    15.6  2.0         Right Tibial Motor (Abd Hall Brev)  34C  Ankle    4.6 <6.0 4.7 >4 Knee Ankle 11.0 44.0 40 >40  Knee    15.6  3.7          F Wave Studies   NR F-Lat (ms) Lat Norm (ms) L-R F-Lat (ms)  Left Tibial (Mrkrs) (Abd Hallucis)  34C     68.95 <55 1.11  Right Tibial (Mrkrs) (Abd Hallucis)  34C     67.84 <55 1.11   H Reflex Studies   NR H-Lat (ms) Lat Norm (ms) L-R H-Lat (ms)  Left Tibial (Gastroc)  34C     40.00 <35 0.00  Right Tibial (Gastroc)  34C     40.00 <35 0.00   EMG   Side Muscle Ins Act Fibs Psw Fasc Number Recrt Dur Dur. Amp Amp. Poly Poly. Comment  Right AntTibialis Nml Nml Nml Nml Nml Nml Nml Nml Nml Nml Nml Nml N/A  Right Gastroc Nml Nml Nml Nml Nml Nml Nml Nml Nml Nml Nml Nml N/A  Right Flex Dig Long Nml Nml Nml Nml 1- Rapid Some 1+ Some 1+ Nml Nml N/A  Right  RectFemoris Nml Nml Nml Nml Nml Nml Nml Nml Nml Nml Nml Nml N/A  Right GluteusMed Nml Nml Nml Nml Nml Nml Nml Nml Nml Nml Nml Nml N/A  Right Lumbo Parasp Low Nml Nml Nml Nml NE - - - - - - - N/A  Left AntTibialis Nml Nml Nml Nml 1- Rapid Some 1+ Some 1+ Some 1+ N/A  Left Gastroc Nml Nml Nml Nml Nml Nml Nml Nml Nml Nml Nml Nml N/A  Left Flex Dig Long Nml Nml Nml Nml 1- Rapid Some 1+ Some 1+ Some 1+ N/A  Left RectFemoris Nml Nml Nml Nml Nml Nml Nml Nml Nml Nml Nml Nml N/A  Left GluteusMed Nml Nml Nml Nml Nml Nml Nml Nml Nml Nml Nml Nml N/A      Waveforms:

## 2019-04-21 LAB — IMMUNOFIXATION ELECTROPHORESIS
IgG (Immunoglobin G), Serum: 836 mg/dL (ref 600–1540)
IgM, Serum: 153 mg/dL (ref 50–300)
Immunofix Electr Int: NOT DETECTED
Immunoglobulin A: 197 mg/dL (ref 70–320)

## 2019-04-21 LAB — TSH: TSH: 1.26 mIU/L (ref 0.40–4.50)

## 2019-04-21 LAB — COPPER, SERUM: Copper: 106 ug/dL (ref 70–175)

## 2019-04-21 LAB — B12 AND FOLATE PANEL
Folate: 19.6 ng/mL
Vitamin B-12: >2000 pg/mL — ABNORMAL HIGH (ref 200–1100)

## 2019-04-21 LAB — PROTEIN ELECTROPHORESIS, SERUM
Albumin ELP: 4 g/dL (ref 3.8–4.8)
Alpha 1: 0.3 g/dL (ref 0.2–0.3)
Alpha 2: 0.8 g/dL (ref 0.5–0.9)
Beta 2: 0.4 g/dL (ref 0.2–0.5)
Beta Globulin: 0.5 g/dL (ref 0.4–0.6)
Gamma Globulin: 0.7 g/dL — ABNORMAL LOW (ref 0.8–1.7)
Total Protein: 6.7 g/dL (ref 6.1–8.1)

## 2019-04-21 LAB — VITAMIN B1: Vitamin B1 (Thiamine): 28 nmol/L (ref 8–30)

## 2019-04-21 LAB — SEDIMENTATION RATE: Sed Rate: 6 mm/h (ref 0–20)

## 2019-04-22 ENCOUNTER — Telehealth: Payer: Self-pay

## 2019-04-22 DIAGNOSIS — G629 Polyneuropathy, unspecified: Secondary | ICD-10-CM

## 2019-04-22 DIAGNOSIS — R531 Weakness: Secondary | ICD-10-CM

## 2019-04-22 DIAGNOSIS — M5410 Radiculopathy, site unspecified: Secondary | ICD-10-CM

## 2019-04-22 NOTE — Telephone Encounter (Signed)
-----   Message from Alda Berthold, DO sent at 04/22/2019  8:33 AM EDT ----- Please notify patient lab are within normal limits.  I recommend we proceed with lumbar puncture.  Will need to order LP and check CSF cell count and diff, protein, glucose, IgG index, oligoclonal bands, myelin basic protein, flow cytology, ACE.  Thank you.

## 2019-04-22 NOTE — Telephone Encounter (Signed)
Patient aware of results and recommendations.  Orders placed.   

## 2019-04-23 ENCOUNTER — Telehealth: Payer: Self-pay | Admitting: Neurology

## 2019-04-23 NOTE — Telephone Encounter (Signed)
I'm not sure what this is.  I have not prescribed anything for him.

## 2019-04-23 NOTE — Telephone Encounter (Signed)
Omega XL is a fish oil supplement over the counter.  He has heard that is helps with neuopathy.  Please advise.

## 2019-04-23 NOTE — Telephone Encounter (Signed)
Left detailed message advising patient of Dr Ena Dawley response.

## 2019-04-23 NOTE — Telephone Encounter (Signed)
Patient was calling in to see if he could start taking the OMega XL- Please advise and call him back. Thanks!

## 2019-04-23 NOTE — Telephone Encounter (Signed)
There is no evidenced based data for fish oil for neuropathy and therefore, I would not recommend using it for nerve problems.  Fish oil is commonly taken for cholesterol where there is more data to support its use.

## 2019-05-08 ENCOUNTER — Ambulatory Visit
Admission: RE | Admit: 2019-05-08 | Discharge: 2019-05-08 | Disposition: A | Discharge status: Home / Self Care | Payer: Medicare Other | Source: Ambulatory Visit | Attending: Neurology | Admitting: Neurology

## 2019-05-08 ENCOUNTER — Other Ambulatory Visit: Payer: Self-pay

## 2019-05-08 VITALS — BP 130/69 | HR 74

## 2019-05-08 DIAGNOSIS — R531 Weakness: Secondary | ICD-10-CM

## 2019-05-08 DIAGNOSIS — M5410 Radiculopathy, site unspecified: Secondary | ICD-10-CM

## 2019-05-08 DIAGNOSIS — M5416 Radiculopathy, lumbar region: Secondary | ICD-10-CM | POA: Diagnosis not present

## 2019-05-08 DIAGNOSIS — G629 Polyneuropathy, unspecified: Secondary | ICD-10-CM

## 2019-05-08 NOTE — Progress Notes (Signed)
Blood obtained from pt's R AC for LP labs. Pt tolerated procedure well. Site is unremarkable.  

## 2019-05-08 NOTE — Discharge Instructions (Signed)

## 2019-05-12 ENCOUNTER — Telehealth: Payer: Self-pay | Admitting: Neurology

## 2019-05-12 NOTE — Telephone Encounter (Signed)
I contacted patient I do not see final results, advised to patient I would call as soon as I got them.

## 2019-05-12 NOTE — Telephone Encounter (Signed)
Patient is needing lumbar puncture results, thanks!

## 2019-05-14 LAB — CNS IGG SYNTHESIS RATE, CSF+BLOOD
Albumin Serum: 3.8 g/dL (ref 3.2–4.6)
Albumin, CSF: 61 mg/dL — ABNORMAL HIGH (ref 8.0–42.0)
CNS-IgG Synthesis Rate: 4 mg/24 h — ABNORMAL HIGH (ref <=3.3)
IgG (Immunoglobin G), Serum: 813 mg/dL (ref 600–1540)
IgG Total CSF: 7.1 mg/dL (ref 0.8–7.7)
IgG-Index: 0.54 (ref <0.66)

## 2019-05-14 LAB — MYELIN BASIC PROTEIN, CSF: Myelin Basic Protein: <2 mcg/L (ref 2.0–4.0)

## 2019-05-14 LAB — LEUKEMIA/LYMPHOMA EVALUATION PANEL

## 2019-05-14 LAB — CSF CELL COUNT WITH DIFFERENTIAL
RBC Count, CSF: 1 cells/uL (ref 0–10)
WBC, CSF: 0 cells/uL (ref 0–5)

## 2019-05-14 LAB — PROTEIN, CSF: Total Protein, CSF: 105 mg/dL — ABNORMAL HIGH (ref 15–60)

## 2019-05-14 LAB — ANGIOTENSIN CONVERTING ENZYME, CSF: ACE, CSF: 7 U/L (ref <=15)

## 2019-05-14 LAB — OLIGOCLONAL BANDS, CSF + SERM

## 2019-05-14 LAB — GLUCOSE, CSF: Glucose, CSF: 73 mg/dL (ref 40–80)

## 2019-05-15 ENCOUNTER — Telehealth: Payer: Self-pay | Admitting: Neurology

## 2019-05-15 DIAGNOSIS — G6181 Chronic inflammatory demyelinating polyneuritis: Secondary | ICD-10-CM

## 2019-05-15 MED ORDER — SODIUM CHLORIDE 0.9 % IV SOLN: 1000.0000 mg | INTRAVENOUS | Status: DC

## 2019-05-15 MED ORDER — SODIUM CHLORIDE 0.9 % IV SOLN: 1000.0000 mg | Freq: Every day | INTRAVENOUS | Status: AC

## 2019-05-15 NOTE — Telephone Encounter (Signed)
Orders placed spoke with [facility] at [facility] short stay, will contact us to give an appt time.

## 2019-05-15 NOTE — Telephone Encounter (Signed)
Patient was informed that results of spinal analysis are consistent with chronic inflammatory demyelinating polyradiculoneuropathy (albuminocytologic dissociation W0 R1 G73 P105, 2 oligoclonal bands, normal IgG index, MBP and ACE).  I discussed management options which consists of methylprednisolone vs IVIG.  Risks and benefits of each were discussed and it was mutually decided to start with methylprednisolone. He does not have diabetes.  Proceed with intravenous methylprednisolone 1g x 5 days, then every 28 days.  Start calcium supplements and omeprazole 20mg  BID.  I will see him back in 4-6 weeks and follow him closely.  Richard Schwartz K. Posey Pronto, DO

## 2019-05-16 ENCOUNTER — Other Ambulatory Visit: Payer: Self-pay | Admitting: *Deleted

## 2019-05-16 ENCOUNTER — Telehealth: Payer: Self-pay | Admitting: *Deleted

## 2019-05-16 NOTE — Telephone Encounter (Signed)
Called D. Augustin Coupe at [facility] and make appt for patient to receive IV Methylprednisolone

## 2019-05-16 NOTE — Telephone Encounter (Signed)
Spoke to patient and made him aware of appt at [facility] ([address]) at 0800 this Monday 7/27 for his IV Solumedrol. He was also made aware of all his daily appts (x5)  for next week with [facility] as well as the first monthly appt (8/28). Orders placed for infusion in Epic.   Patient also made aware of his follow up with Dr. Posey Pronto 9/1 at 1500.

## 2019-05-19 ENCOUNTER — Non-Acute Institutional Stay (HOSPITAL_COMMUNITY)
Admission: RE | Admit: 2019-05-19 | Discharge: 2019-05-19 | Disposition: A | Discharge status: Home / Self Care | Payer: Medicare Other | Source: Ambulatory Visit | Attending: Neurology | Admitting: Neurology

## 2019-05-19 ENCOUNTER — Other Ambulatory Visit: Payer: Self-pay

## 2019-05-19 DIAGNOSIS — R2681 Unsteadiness on feet: Secondary | ICD-10-CM | POA: Insufficient documentation

## 2019-05-19 DIAGNOSIS — R208 Other disturbances of skin sensation: Secondary | ICD-10-CM | POA: Insufficient documentation

## 2019-05-19 MED ORDER — SODIUM CHLORIDE 0.9 % IV SOLN
1000.0000 mg | Freq: Every day | INTRAVENOUS | Status: DC
  Administered 2019-05-19: 1000 mg via INTRAVENOUS
  Filled 2019-05-19: qty 8

## 2019-05-19 MED ORDER — SODIUM CHLORIDE 0.9 % IV SOLN
INTRAVENOUS | Status: DC | PRN
  Administered 2019-05-19: 250 mL via INTRAVENOUS

## 2019-05-19 NOTE — Discharge Instructions (Signed)
Methylprednisolone Solution for Injection What is this medicine? METHYLPREDNISOLONE (meth ill pred NISS oh lone) is a corticosteroid. It is commonly used to treat inflammation of the skin, joints, lungs, and other organs. Common conditions treated include asthma, allergies, and arthritis. It is also used for other conditions, such as blood disorders and diseases of the adrenal glands. This medicine may be used for other purposes; ask your health care provider or pharmacist if you have questions. COMMON BRAND NAME(S): A-Methapred, Solu-Medrol What should I tell my health care provider before I take this medicine? They need to know if you have any of these conditions:  Cushing's syndrome  eye disease, vision problems  diabetes  glaucoma  heart disease  high blood pressure  infection (especially a virus infection such as chickenpox, cold sores, or herpes)  liver disease  mental illness  myasthenia gravis  osteoporosis  recently received or scheduled to receive a vaccine  seizures  stomach or intestine problems  thyroid disease  an unusual or allergic reaction to lactose, methylprednisolone, other medicines, foods, dyes, or preservatives  pregnant or trying to get pregnant  breast-feeding How should I use this medicine? This medicine is for injection or infusion into a vein. It is also for injection into a muscle. It is given by a health care professional in a hospital or clinic setting. Talk to your pediatrician regarding the use of this medicine in children. While this drug may be prescribed for selected conditions, precautions do apply. Overdosage: If you think you have taken too much of this medicine contact a poison control center or emergency room at once. NOTE: This medicine is only for you. Do not share this medicine with others. What if I miss a dose? This does not apply. What may interact with this medicine? Do not take this medicine with any of the following  medications:  alefacept  echinacea  iopamidol  live virus vaccines  metyrapone  mifepristone This medicine may also interact with the following medications:  amphotericin B  aspirin and aspirin-like medicines  certain antibiotics like erythromycin, clarithromycin, troleandomycin  certain medicines for diabetes  certain medicines for fungal infection like ketoconazole  certain medicines for seizures like carbamazepine, phenobarbital, phenytoin  certain medicines that treat or prevent blood clots like warfarin  cyclosporine  digoxin  diuretics  male hormones, like estrogens and birth control pills  isoniazid  NSAIDS, medicines for pain and inflammation, like ibuprofen or naproxen  other medicines for myasthenia gravis  rifampin  vaccines This list may not describe all possible interactions. Give your health care provider a list of all the medicines, herbs, non-prescription drugs, or dietary supplements you use. Also tell them if you smoke, drink alcohol, or use illegal drugs. Some items may interact with your medicine. What should I watch for while using this medicine? Tell your doctor or healthcare professional if your symptoms do not start to get better or if they get worse. Do not stop taking except on your doctor's advice. You may develop a severe reaction. Your doctor will tell you how much medicine to take. Your condition will be monitored carefully while you are receiving this medicine. This medicine may increase your risk of getting an infection. Tell your doctor or health care professional if you are around anyone with measles or chickenpox, or if you develop sores or blisters that do not heal properly. This medicine may increase blood sugar. Ask your healthcare provider if changes in diet or medicines are needed if you have diabetes. Tell  your doctor or health care professional right away if you have any change in your eyesight. Using this medicine for a  long time may increase your risk of low bone mass. Talk to your doctor about bone health. What side effects may I notice from receiving this medicine? Side effects that you should report to your doctor or health care professional as soon as possible:  allergic reactions like skin rash, itching or hives, swelling of the face, lips, or tongue  bloody or tarry stools  hallucination, loss of contact with reality  muscle cramps  muscle pain  palpitations  signs and symptoms of high blood sugar such as being more thirsty or hungry or having to urinate more than normal. You may also feel very tired or have blurry vision.  signs and symptoms of infection like fever or chills; cough; sore throat; pain or trouble passing urine  trouble passing urine Side effects that usually do not require medical attention (report to your doctor or health care professional if they continue or are bothersome):  changes in emotions or mood  constipation  diarrhea  excessive hair growth on the face or body  headache  nausea, vomiting  pain, redness, or irritation at site where injected  trouble sleeping  weight gain This list may not describe all possible side effects. Call your doctor for medical advice about side effects. You may report side effects to FDA at 1-800-FDA-1088. Where should I keep my medicine? This drug is given in a hospital or clinic and will not be stored at home. NOTE: This sheet is a summary. It may not cover all possible information. If you have questions about this medicine, talk to your doctor, pharmacist, or health care provider.  2020 Elsevier/Gold Standard (2018-07-11 09:12:19)

## 2019-05-19 NOTE — Progress Notes (Signed)
PATIENT CARE CENTER NOTE  Provider: [name], DO   Procedure: Solu-Medrol infusion   Note: Patient received first Solu-Medrol infusion via PIV. Tolerated well. Vital signs remained stable. Patient to come back tomorrow for second infusion. Discharge instructions given. Patient alert, oriented and ambulatory at discharge.

## 2019-05-20 ENCOUNTER — Ambulatory Visit (HOSPITAL_COMMUNITY)
Admission: RE | Admit: 2019-05-20 | Discharge: 2019-05-20 | Disposition: A | Discharge status: Home / Self Care | Payer: Medicare Other | Source: Ambulatory Visit | Attending: Internal Medicine | Admitting: Internal Medicine

## 2019-05-20 DIAGNOSIS — G6181 Chronic inflammatory demyelinating polyneuritis: Secondary | ICD-10-CM | POA: Diagnosis not present

## 2019-05-20 MED ORDER — SODIUM CHLORIDE 0.9 % IV SOLN
1000.0000 mg | Freq: Every day | INTRAVENOUS | Status: DC
  Administered 2019-05-20: 1000 mg via INTRAVENOUS
  Filled 2019-05-20: qty 8

## 2019-05-20 MED ORDER — SODIUM CHLORIDE 0.9 % IV SOLN
INTRAVENOUS | Status: DC | PRN
  Administered 2019-05-20: 250 mL via INTRAVENOUS

## 2019-05-20 NOTE — Discharge Instructions (Signed)
Methylprednisolone Solution for Injection What is this medicine? METHYLPREDNISOLONE (meth ill pred NISS oh lone) is a corticosteroid. It is commonly used to treat inflammation of the skin, joints, lungs, and other organs. Common conditions treated include asthma, allergies, and arthritis. It is also used for other conditions, such as blood disorders and diseases of the adrenal glands. This medicine may be used for other purposes; ask your health care provider or pharmacist if you have questions. COMMON BRAND NAME(S): A-Methapred, Solu-Medrol What should I tell my health care provider before I take this medicine? They need to know if you have any of these conditions:  Cushing's syndrome  eye disease, vision problems  diabetes  glaucoma  heart disease  high blood pressure  infection (especially a virus infection such as chickenpox, cold sores, or herpes)  liver disease  mental illness  myasthenia gravis  osteoporosis  recently received or scheduled to receive a vaccine  seizures  stomach or intestine problems  thyroid disease  an unusual or allergic reaction to lactose, methylprednisolone, other medicines, foods, dyes, or preservatives  pregnant or trying to get pregnant  breast-feeding How should I use this medicine? This medicine is for injection or infusion into a vein. It is also for injection into a muscle. It is given by a health care professional in a hospital or clinic setting. Talk to your pediatrician regarding the use of this medicine in children. While this drug may be prescribed for selected conditions, precautions do apply. Overdosage: If you think you have taken too much of this medicine contact a poison control center or emergency room at once. NOTE: This medicine is only for you. Do not share this medicine with others. What if I miss a dose? This does not apply. What may interact with this medicine? Do not take this medicine with any of the following  medications:  alefacept  echinacea  iopamidol  live virus vaccines  metyrapone  mifepristone This medicine may also interact with the following medications:  amphotericin B  aspirin and aspirin-like medicines  certain antibiotics like erythromycin, clarithromycin, troleandomycin  certain medicines for diabetes  certain medicines for fungal infection like ketoconazole  certain medicines for seizures like carbamazepine, phenobarbital, phenytoin  certain medicines that treat or prevent blood clots like warfarin  cyclosporine  digoxin  diuretics  male hormones, like estrogens and birth control pills  isoniazid  NSAIDS, medicines for pain and inflammation, like ibuprofen or naproxen  other medicines for myasthenia gravis  rifampin  vaccines This list may not describe all possible interactions. Give your health care provider a list of all the medicines, herbs, non-prescription drugs, or dietary supplements you use. Also tell them if you smoke, drink alcohol, or use illegal drugs. Some items may interact with your medicine. What should I watch for while using this medicine? Tell your doctor or healthcare professional if your symptoms do not start to get better or if they get worse. Do not stop taking except on your doctor's advice. You may develop a severe reaction. Your doctor will tell you how much medicine to take. Your condition will be monitored carefully while you are receiving this medicine. This medicine may increase your risk of getting an infection. Tell your doctor or health care professional if you are around anyone with measles or chickenpox, or if you develop sores or blisters that do not heal properly. This medicine may increase blood sugar. Ask your healthcare provider if changes in diet or medicines are needed if you have diabetes. Tell  your doctor or health care professional right away if you have any change in your eyesight. Using this medicine for a  long time may increase your risk of low bone mass. Talk to your doctor about bone health. What side effects may I notice from receiving this medicine? Side effects that you should report to your doctor or health care professional as soon as possible:  allergic reactions like skin rash, itching or hives, swelling of the face, lips, or tongue  bloody or tarry stools  hallucination, loss of contact with reality  muscle cramps  muscle pain  palpitations  signs and symptoms of high blood sugar such as being more thirsty or hungry or having to urinate more than normal. You may also feel very tired or have blurry vision.  signs and symptoms of infection like fever or chills; cough; sore throat; pain or trouble passing urine  trouble passing urine Side effects that usually do not require medical attention (report to your doctor or health care professional if they continue or are bothersome):  changes in emotions or mood  constipation  diarrhea  excessive hair growth on the face or body  headache  nausea, vomiting  pain, redness, or irritation at site where injected  trouble sleeping  weight gain This list may not describe all possible side effects. Call your doctor for medical advice about side effects. You may report side effects to FDA at 1-800-FDA-1088. Where should I keep my medicine? This drug is given in a hospital or clinic and will not be stored at home. NOTE: This sheet is a summary. It may not cover all possible information. If you have questions about this medicine, talk to your doctor, pharmacist, or health care provider.  2020 Elsevier/Gold Standard (2018-07-11 09:12:19)

## 2019-05-20 NOTE — Progress Notes (Signed)
PATIENT CARE CENTER NOTE  Provider: [name], DO   Procedure: Solu-Medrol infusion   Note: Patient received second Solu-Medrol infusion via PIV. Tolerated well. Vital signs remained stable. Patient to come back tomorrow for third infusion. Discharge instructions given. Patient alert, oriented and ambulatory at discharge.

## 2019-05-21 ENCOUNTER — Other Ambulatory Visit: Payer: Self-pay

## 2019-05-21 ENCOUNTER — Ambulatory Visit (HOSPITAL_COMMUNITY)
Admission: RE | Admit: 2019-05-21 | Discharge: 2019-05-21 | Disposition: A | Discharge status: Home / Self Care | Payer: Medicare Other | Source: Ambulatory Visit | Attending: Internal Medicine | Admitting: Internal Medicine

## 2019-05-21 DIAGNOSIS — G6181 Chronic inflammatory demyelinating polyneuritis: Secondary | ICD-10-CM | POA: Diagnosis not present

## 2019-05-21 MED ORDER — SODIUM CHLORIDE 0.9 % IV SOLN
INTRAVENOUS | Status: DC | PRN
  Administered 2019-05-21: 250 mL via INTRAVENOUS

## 2019-05-21 MED ORDER — SODIUM CHLORIDE 0.9 % IV SOLN
1000.0000 mg | Freq: Once | INTRAVENOUS | Status: AC
  Administered 2019-05-21: 1000 mg via INTRAVENOUS
  Filled 2019-05-21: qty 8

## 2019-05-21 NOTE — Progress Notes (Signed)
Patient received Solumedrol via PIV. Tolerated well, vitals stable, discharge instructions given, verbalized understanding. Patient to come back tomorrow. Patient alert, oriented and ambulatory at the time of discharge.

## 2019-05-21 NOTE — Discharge Instructions (Signed)
Methylprednisolone Solution for Injection What is this medicine? METHYLPREDNISOLONE (meth ill pred NISS oh lone) is a corticosteroid. It is commonly used to treat inflammation of the skin, joints, lungs, and other organs. Common conditions treated include asthma, allergies, and arthritis. It is also used for other conditions, such as blood disorders and diseases of the adrenal glands. This medicine may be used for other purposes; ask your health care provider or pharmacist if you have questions. COMMON BRAND NAME(S): A-Methapred, Solu-Medrol What should I tell my health care provider before I take this medicine? They need to know if you have any of these conditions:  Cushing's syndrome  eye disease, vision problems  diabetes  glaucoma  heart disease  high blood pressure  infection (especially a virus infection such as chickenpox, cold sores, or herpes)  liver disease  mental illness  myasthenia gravis  osteoporosis  recently received or scheduled to receive a vaccine  seizures  stomach or intestine problems  thyroid disease  an unusual or allergic reaction to lactose, methylprednisolone, other medicines, foods, dyes, or preservatives  pregnant or trying to get pregnant  breast-feeding How should I use this medicine? This medicine is for injection or infusion into a vein. It is also for injection into a muscle. It is given by a health care professional in a hospital or clinic setting. Talk to your pediatrician regarding the use of this medicine in children. While this drug may be prescribed for selected conditions, precautions do apply. Overdosage: If you think you have taken too much of this medicine contact a poison control center or emergency room at once. NOTE: This medicine is only for you. Do not share this medicine with others. What if I miss a dose? This does not apply. What may interact with this medicine? Do not take this medicine with any of the following  medications:  alefacept  echinacea  iopamidol  live virus vaccines  metyrapone  mifepristone This medicine may also interact with the following medications:  amphotericin B  aspirin and aspirin-like medicines  certain antibiotics like erythromycin, clarithromycin, troleandomycin  certain medicines for diabetes  certain medicines for fungal infection like ketoconazole  certain medicines for seizures like carbamazepine, phenobarbital, phenytoin  certain medicines that treat or prevent blood clots like warfarin  cyclosporine  digoxin  diuretics  male hormones, like estrogens and birth control pills  isoniazid  NSAIDS, medicines for pain and inflammation, like ibuprofen or naproxen  other medicines for myasthenia gravis  rifampin  vaccines This list may not describe all possible interactions. Give your health care provider a list of all the medicines, herbs, non-prescription drugs, or dietary supplements you use. Also tell them if you smoke, drink alcohol, or use illegal drugs. Some items may interact with your medicine. What should I watch for while using this medicine? Tell your doctor or healthcare professional if your symptoms do not start to get better or if they get worse. Do not stop taking except on your doctor's advice. You may develop a severe reaction. Your doctor will tell you how much medicine to take. Your condition will be monitored carefully while you are receiving this medicine. This medicine may increase your risk of getting an infection. Tell your doctor or health care professional if you are around anyone with measles or chickenpox, or if you develop sores or blisters that do not heal properly. This medicine may increase blood sugar. Ask your healthcare provider if changes in diet or medicines are needed if you have diabetes. Tell  your doctor or health care professional right away if you have any change in your eyesight. Using this medicine for a  long time may increase your risk of low bone mass. Talk to your doctor about bone health. What side effects may I notice from receiving this medicine? Side effects that you should report to your doctor or health care professional as soon as possible:  allergic reactions like skin rash, itching or hives, swelling of the face, lips, or tongue  bloody or tarry stools  hallucination, loss of contact with reality  muscle cramps  muscle pain  palpitations  signs and symptoms of high blood sugar such as being more thirsty or hungry or having to urinate more than normal. You may also feel very tired or have blurry vision.  signs and symptoms of infection like fever or chills; cough; sore throat; pain or trouble passing urine  trouble passing urine Side effects that usually do not require medical attention (report to your doctor or health care professional if they continue or are bothersome):  changes in emotions or mood  constipation  diarrhea  excessive hair growth on the face or body  headache  nausea, vomiting  pain, redness, or irritation at site where injected  trouble sleeping  weight gain This list may not describe all possible side effects. Call your doctor for medical advice about side effects. You may report side effects to FDA at 1-800-FDA-1088. Where should I keep my medicine? This drug is given in a hospital or clinic and will not be stored at home. NOTE: This sheet is a summary. It may not cover all possible information. If you have questions about this medicine, talk to your doctor, pharmacist, or health care provider.  2020 Elsevier/Gold Standard (2018-07-11 09:12:19)

## 2019-05-22 ENCOUNTER — Ambulatory Visit (HOSPITAL_COMMUNITY)
Admission: RE | Admit: 2019-05-22 | Discharge: 2019-05-22 | Disposition: A | Discharge status: Home / Self Care | Payer: Medicare Other | Source: Ambulatory Visit | Attending: Neurology | Admitting: Neurology

## 2019-05-22 DIAGNOSIS — G6181 Chronic inflammatory demyelinating polyneuritis: Secondary | ICD-10-CM | POA: Diagnosis not present

## 2019-05-22 MED ORDER — SODIUM CHLORIDE 0.9 % IV SOLN
1000.0000 mg | Freq: Every day | INTRAVENOUS | Status: DC
  Administered 2019-05-22: 1000 mg via INTRAVENOUS
  Filled 2019-05-22: qty 8

## 2019-05-22 MED ORDER — SODIUM CHLORIDE 0.9 % IV SOLN
INTRAVENOUS | Status: DC | PRN
  Administered 2019-05-22: 250 mL via INTRAVENOUS

## 2019-05-22 NOTE — Progress Notes (Signed)
PATIENT CARE CENTER NOTE  Provider:Patel, Donika, DO   Procedure:Solu-Medrol infusion   Note:Patient received fourth Solu-Medrol infusion via PIV. Tolerated well. Vital signs remained stable. Patient to come back tomorrow for fifth  infusion. Discharge instructions given. Patient alert, oriented and ambulatory at discharge.

## 2019-05-22 NOTE — Discharge Instructions (Signed)
Methylprednisolone Solution for Injection What is this medicine? METHYLPREDNISOLONE (meth ill pred NISS oh lone) is a corticosteroid. It is commonly used to treat inflammation of the skin, joints, lungs, and other organs. Common conditions treated include asthma, allergies, and arthritis. It is also used for other conditions, such as blood disorders and diseases of the adrenal glands. This medicine may be used for other purposes; ask your health care provider or pharmacist if you have questions. COMMON BRAND NAME(S): A-Methapred, Solu-Medrol What should I tell my health care provider before I take this medicine? They need to know if you have any of these conditions:  Cushing's syndrome  eye disease, vision problems  diabetes  glaucoma  heart disease  high blood pressure  infection (especially a virus infection such as chickenpox, cold sores, or herpes)  liver disease  mental illness  myasthenia gravis  osteoporosis  recently received or scheduled to receive a vaccine  seizures  stomach or intestine problems  thyroid disease  an unusual or allergic reaction to lactose, methylprednisolone, other medicines, foods, dyes, or preservatives  pregnant or trying to get pregnant  breast-feeding How should I use this medicine? This medicine is for injection or infusion into a vein. It is also for injection into a muscle. It is given by a health care professional in a hospital or clinic setting. Talk to your pediatrician regarding the use of this medicine in children. While this drug may be prescribed for selected conditions, precautions do apply. Overdosage: If you think you have taken too much of this medicine contact a poison control center or emergency room at once. NOTE: This medicine is only for you. Do not share this medicine with others. What if I miss a dose? This does not apply. What may interact with this medicine? Do not take this medicine with any of the following  medications:  alefacept  echinacea  iopamidol  live virus vaccines  metyrapone  mifepristone This medicine may also interact with the following medications:  amphotericin B  aspirin and aspirin-like medicines  certain antibiotics like erythromycin, clarithromycin, troleandomycin  certain medicines for diabetes  certain medicines for fungal infection like ketoconazole  certain medicines for seizures like carbamazepine, phenobarbital, phenytoin  certain medicines that treat or prevent blood clots like warfarin  cyclosporine  digoxin  diuretics  male hormones, like estrogens and birth control pills  isoniazid  NSAIDS, medicines for pain and inflammation, like ibuprofen or naproxen  other medicines for myasthenia gravis  rifampin  vaccines This list may not describe all possible interactions. Give your health care provider a list of all the medicines, herbs, non-prescription drugs, or dietary supplements you use. Also tell them if you smoke, drink alcohol, or use illegal drugs. Some items may interact with your medicine. What should I watch for while using this medicine? Tell your doctor or healthcare professional if your symptoms do not start to get better or if they get worse. Do not stop taking except on your doctor's advice. You may develop a severe reaction. Your doctor will tell you how much medicine to take. Your condition will be monitored carefully while you are receiving this medicine. This medicine may increase your risk of getting an infection. Tell your doctor or health care professional if you are around anyone with measles or chickenpox, or if you develop sores or blisters that do not heal properly. This medicine may increase blood sugar. Ask your healthcare provider if changes in diet or medicines are needed if you have diabetes. Tell  your doctor or health care professional right away if you have any change in your eyesight. Using this medicine for a  long time may increase your risk of low bone mass. Talk to your doctor about bone health. What side effects may I notice from receiving this medicine? Side effects that you should report to your doctor or health care professional as soon as possible:  allergic reactions like skin rash, itching or hives, swelling of the face, lips, or tongue  bloody or tarry stools  hallucination, loss of contact with reality  muscle cramps  muscle pain  palpitations  signs and symptoms of high blood sugar such as being more thirsty or hungry or having to urinate more than normal. You may also feel very tired or have blurry vision.  signs and symptoms of infection like fever or chills; cough; sore throat; pain or trouble passing urine  trouble passing urine Side effects that usually do not require medical attention (report to your doctor or health care professional if they continue or are bothersome):  changes in emotions or mood  constipation  diarrhea  excessive hair growth on the face or body  headache  nausea, vomiting  pain, redness, or irritation at site where injected  trouble sleeping  weight gain This list may not describe all possible side effects. Call your doctor for medical advice about side effects. You may report side effects to FDA at 1-800-FDA-1088. Where should I keep my medicine? This drug is given in a hospital or clinic and will not be stored at home. NOTE: This sheet is a summary. It may not cover all possible information. If you have questions about this medicine, talk to your doctor, pharmacist, or health care provider.  2020 Elsevier/Gold Standard (2018-07-11 09:12:19)

## 2019-05-23 ENCOUNTER — Other Ambulatory Visit: Payer: Self-pay

## 2019-05-23 ENCOUNTER — Ambulatory Visit (HOSPITAL_COMMUNITY)
Admission: RE | Admit: 2019-05-23 | Discharge: 2019-05-23 | Disposition: A | Discharge status: Home / Self Care | Payer: Medicare Other | Source: Ambulatory Visit | Attending: Neurology | Admitting: Neurology

## 2019-05-23 DIAGNOSIS — G6181 Chronic inflammatory demyelinating polyneuritis: Secondary | ICD-10-CM | POA: Diagnosis not present

## 2019-05-23 MED ORDER — SODIUM CHLORIDE 0.9 % IV SOLN
INTRAVENOUS | Status: DC | PRN
  Administered 2019-05-23: 250 mL via INTRAVENOUS

## 2019-05-23 MED ORDER — SODIUM CHLORIDE 0.9 % IV SOLN
1000.0000 mg | Freq: Once | INTRAVENOUS | Status: AC
  Administered 2019-05-23: 1000 mg via INTRAVENOUS
  Filled 2019-05-23: qty 8

## 2019-05-23 NOTE — Progress Notes (Signed)
PATIENT CARE CENTER NOTE  Provider:Patel, Donika, DO   Procedure:Solu-Medrol infusion   Note:Patient receivedfifthSolu-Medrol infusion via PIV. Tolerated well. Vital signs remained stable. Patient to come back next month for monthy infusion. Discharge instructions given. Patient alert, oriented and ambulatory at discharge.

## 2019-05-23 NOTE — Discharge Instructions (Signed)
Methylprednisolone Solution for Injection What is this medicine? METHYLPREDNISOLONE (meth ill pred NISS oh lone) is a corticosteroid. It is commonly used to treat inflammation of the skin, joints, lungs, and other organs. Common conditions treated include asthma, allergies, and arthritis. It is also used for other conditions, such as blood disorders and diseases of the adrenal glands. This medicine may be used for other purposes; ask your health care provider or pharmacist if you have questions. COMMON BRAND NAME(S): A-Methapred, Solu-Medrol What should I tell my health care provider before I take this medicine? They need to know if you have any of these conditions:  Cushing's syndrome  eye disease, vision problems  diabetes  glaucoma  heart disease  high blood pressure  infection (especially a virus infection such as chickenpox, cold sores, or herpes)  liver disease  mental illness  myasthenia gravis  osteoporosis  recently received or scheduled to receive a vaccine  seizures  stomach or intestine problems  thyroid disease  an unusual or allergic reaction to lactose, methylprednisolone, other medicines, foods, dyes, or preservatives  pregnant or trying to get pregnant  breast-feeding How should I use this medicine? This medicine is for injection or infusion into a vein. It is also for injection into a muscle. It is given by a health care professional in a hospital or clinic setting. Talk to your pediatrician regarding the use of this medicine in children. While this drug may be prescribed for selected conditions, precautions do apply. Overdosage: If you think you have taken too much of this medicine contact a poison control center or emergency room at once. NOTE: This medicine is only for you. Do not share this medicine with others. What if I miss a dose? This does not apply. What may interact with this medicine? Do not take this medicine with any of the following  medications:  alefacept  echinacea  iopamidol  live virus vaccines  metyrapone  mifepristone This medicine may also interact with the following medications:  amphotericin B  aspirin and aspirin-like medicines  certain antibiotics like erythromycin, clarithromycin, troleandomycin  certain medicines for diabetes  certain medicines for fungal infection like ketoconazole  certain medicines for seizures like carbamazepine, phenobarbital, phenytoin  certain medicines that treat or prevent blood clots like warfarin  cyclosporine  digoxin  diuretics  male hormones, like estrogens and birth control pills  isoniazid  NSAIDS, medicines for pain and inflammation, like ibuprofen or naproxen  other medicines for myasthenia gravis  rifampin  vaccines This list may not describe all possible interactions. Give your health care provider a list of all the medicines, herbs, non-prescription drugs, or dietary supplements you use. Also tell them if you smoke, drink alcohol, or use illegal drugs. Some items may interact with your medicine. What should I watch for while using this medicine? Tell your doctor or healthcare professional if your symptoms do not start to get better or if they get worse. Do not stop taking except on your doctor's advice. You may develop a severe reaction. Your doctor will tell you how much medicine to take. Your condition will be monitored carefully while you are receiving this medicine. This medicine may increase your risk of getting an infection. Tell your doctor or health care professional if you are around anyone with measles or chickenpox, or if you develop sores or blisters that do not heal properly. This medicine may increase blood sugar. Ask your healthcare provider if changes in diet or medicines are needed if you have diabetes. Tell  your doctor or health care professional right away if you have any change in your eyesight. Using this medicine for a  long time may increase your risk of low bone mass. Talk to your doctor about bone health. What side effects may I notice from receiving this medicine? Side effects that you should report to your doctor or health care professional as soon as possible:  allergic reactions like skin rash, itching or hives, swelling of the face, lips, or tongue  bloody or tarry stools  hallucination, loss of contact with reality  muscle cramps  muscle pain  palpitations  signs and symptoms of high blood sugar such as being more thirsty or hungry or having to urinate more than normal. You may also feel very tired or have blurry vision.  signs and symptoms of infection like fever or chills; cough; sore throat; pain or trouble passing urine  trouble passing urine Side effects that usually do not require medical attention (report to your doctor or health care professional if they continue or are bothersome):  changes in emotions or mood  constipation  diarrhea  excessive hair growth on the face or body  headache  nausea, vomiting  pain, redness, or irritation at site where injected  trouble sleeping  weight gain This list may not describe all possible side effects. Call your doctor for medical advice about side effects. You may report side effects to FDA at 1-800-FDA-1088. Where should I keep my medicine? This drug is given in a hospital or clinic and will not be stored at home. NOTE: This sheet is a summary. It may not cover all possible information. If you have questions about this medicine, talk to your doctor, pharmacist, or health care provider.  2020 Elsevier/Gold Standard (2018-07-11 09:12:19)

## 2019-06-18 DIAGNOSIS — H0288B Meibomian gland dysfunction left eye, upper and lower eyelids: Secondary | ICD-10-CM | POA: Diagnosis not present

## 2019-06-18 DIAGNOSIS — H43812 Vitreous degeneration, left eye: Secondary | ICD-10-CM | POA: Diagnosis not present

## 2019-06-18 DIAGNOSIS — H2513 Age-related nuclear cataract, bilateral: Secondary | ICD-10-CM | POA: Diagnosis not present

## 2019-06-18 DIAGNOSIS — H0288A Meibomian gland dysfunction right eye, upper and lower eyelids: Secondary | ICD-10-CM | POA: Diagnosis not present

## 2019-06-18 DIAGNOSIS — H1859 Other hereditary corneal dystrophies: Secondary | ICD-10-CM | POA: Diagnosis not present

## 2019-06-18 DIAGNOSIS — H04123 Dry eye syndrome of bilateral lacrimal glands: Secondary | ICD-10-CM | POA: Diagnosis not present

## 2019-06-20 ENCOUNTER — Ambulatory Visit (HOSPITAL_COMMUNITY)
Admission: RE | Admit: 2019-06-20 | Discharge: 2019-06-20 | Disposition: A | Discharge status: Home / Self Care | Payer: Medicare Other | Source: Ambulatory Visit | Attending: Internal Medicine | Admitting: Internal Medicine

## 2019-06-20 DIAGNOSIS — G6181 Chronic inflammatory demyelinating polyneuritis: Secondary | ICD-10-CM | POA: Insufficient documentation

## 2019-06-20 MED ORDER — SODIUM CHLORIDE 0.9 % IV SOLN
INTRAVENOUS | Status: DC | PRN
  Administered 2019-06-20: 250 mL via INTRAVENOUS

## 2019-06-20 MED ORDER — SODIUM CHLORIDE 0.9 % IV SOLN
1000.0000 mg | Freq: Once | INTRAVENOUS | Status: AC
  Administered 2019-06-20: 1000 mg via INTRAVENOUS
  Filled 2019-06-20: qty 8

## 2019-06-20 NOTE — Discharge Instructions (Signed)
Methylprednisolone Solution for Injection What is this medicine? METHYLPREDNISOLONE (meth ill pred NISS oh lone) is a corticosteroid. It is commonly used to treat inflammation of the skin, joints, lungs, and other organs. Common conditions treated include asthma, allergies, and arthritis. It is also used for other conditions, such as blood disorders and diseases of the adrenal glands. This medicine may be used for other purposes; ask your health care provider or pharmacist if you have questions. COMMON BRAND NAME(S): A-Methapred, Solu-Medrol What should I tell my health care provider before I take this medicine? They need to know if you have any of these conditions:  Cushing's syndrome  eye disease, vision problems  diabetes  glaucoma  heart disease  high blood pressure  infection (especially a virus infection such as chickenpox, cold sores, or herpes)  liver disease  mental illness  myasthenia gravis  osteoporosis  recently received or scheduled to receive a vaccine  seizures  stomach or intestine problems  thyroid disease  an unusual or allergic reaction to lactose, methylprednisolone, other medicines, foods, dyes, or preservatives  pregnant or trying to get pregnant  breast-feeding How should I use this medicine? This medicine is for injection or infusion into a vein. It is also for injection into a muscle. It is given by a health care professional in a hospital or clinic setting. Talk to your pediatrician regarding the use of this medicine in children. While this drug may be prescribed for selected conditions, precautions do apply. Overdosage: If you think you have taken too much of this medicine contact a poison control center or emergency room at once. NOTE: This medicine is only for you. Do not share this medicine with others. What if I miss a dose? This does not apply. What may interact with this medicine? Do not take this medicine with any of the following  medications:  alefacept  echinacea  iopamidol  live virus vaccines  metyrapone  mifepristone This medicine may also interact with the following medications:  amphotericin B  aspirin and aspirin-like medicines  certain antibiotics like erythromycin, clarithromycin, troleandomycin  certain medicines for diabetes  certain medicines for fungal infection like ketoconazole  certain medicines for seizures like carbamazepine, phenobarbital, phenytoin  certain medicines that treat or prevent blood clots like warfarin  cyclosporine  digoxin  diuretics  male hormones, like estrogens and birth control pills  isoniazid  NSAIDS, medicines for pain and inflammation, like ibuprofen or naproxen  other medicines for myasthenia gravis  rifampin  vaccines This list may not describe all possible interactions. Give your health care provider a list of all the medicines, herbs, non-prescription drugs, or dietary supplements you use. Also tell them if you smoke, drink alcohol, or use illegal drugs. Some items may interact with your medicine. What should I watch for while using this medicine? Tell your doctor or healthcare professional if your symptoms do not start to get better or if they get worse. Do not stop taking except on your doctor's advice. You may develop a severe reaction. Your doctor will tell you how much medicine to take. Your condition will be monitored carefully while you are receiving this medicine. This medicine may increase your risk of getting an infection. Tell your doctor or health care professional if you are around anyone with measles or chickenpox, or if you develop sores or blisters that do not heal properly. This medicine may increase blood sugar. Ask your healthcare provider if changes in diet or medicines are needed if you have diabetes. Tell  your doctor or health care professional right away if you have any change in your eyesight. Using this medicine for a  long time may increase your risk of low bone mass. Talk to your doctor about bone health. What side effects may I notice from receiving this medicine? Side effects that you should report to your doctor or health care professional as soon as possible:  allergic reactions like skin rash, itching or hives, swelling of the face, lips, or tongue  bloody or tarry stools  hallucination, loss of contact with reality  muscle cramps  muscle pain  palpitations  signs and symptoms of high blood sugar such as being more thirsty or hungry or having to urinate more than normal. You may also feel very tired or have blurry vision.  signs and symptoms of infection like fever or chills; cough; sore throat; pain or trouble passing urine  trouble passing urine Side effects that usually do not require medical attention (report to your doctor or health care professional if they continue or are bothersome):  changes in emotions or mood  constipation  diarrhea  excessive hair growth on the face or body  headache  nausea, vomiting  pain, redness, or irritation at site where injected  trouble sleeping  weight gain This list may not describe all possible side effects. Call your doctor for medical advice about side effects. You may report side effects to FDA at 1-800-FDA-1088. Where should I keep my medicine? This drug is given in a hospital or clinic and will not be stored at home. NOTE: This sheet is a summary. It may not cover all possible information. If you have questions about this medicine, talk to your doctor, pharmacist, or health care provider.  2020 Elsevier/Gold Standard (2018-07-11 09:12:19)

## 2019-06-20 NOTE — Progress Notes (Signed)
Patient received Solu-medrol via PIV.Tolerated well, vitals stable, discharge instructions given, verbalized understanding. Patient alert, oriented and ambulatory at the time of discharge.

## 2019-06-24 ENCOUNTER — Encounter: Payer: Self-pay | Admitting: Neurology

## 2019-06-24 ENCOUNTER — Telehealth: Payer: Self-pay | Admitting: Neurology

## 2019-06-24 ENCOUNTER — Ambulatory Visit (INDEPENDENT_AMBULATORY_CARE_PROVIDER_SITE_OTHER): Payer: Medicare Other | Admitting: Neurology

## 2019-06-24 DIAGNOSIS — Z5329 Procedure and treatment not carried out because of patient's decision for other reasons: Secondary | ICD-10-CM

## 2019-06-24 NOTE — Telephone Encounter (Signed)
FYI, I will call him just wanted you to see this.

## 2019-06-24 NOTE — Telephone Encounter (Signed)
Please offer him a follow-up visit next Friday in the office.    Thanks.

## 2019-06-24 NOTE — Telephone Encounter (Signed)
Patient called to reschedule his missed appointment with Dr. Posey Pronto this afternoon. Patient very apologetic and is requesting a call back to discuss his recent changes. Patient rescheduled to 08/18/2019.

## 2019-06-25 DIAGNOSIS — G4733 Obstructive sleep apnea (adult) (pediatric): Secondary | ICD-10-CM | POA: Diagnosis not present

## 2019-06-25 NOTE — Progress Notes (Signed)
No show

## 2019-06-26 ENCOUNTER — Other Ambulatory Visit: Payer: Self-pay

## 2019-06-26 DIAGNOSIS — R269 Unspecified abnormalities of gait and mobility: Secondary | ICD-10-CM

## 2019-06-26 DIAGNOSIS — R29898 Other symptoms and signs involving the musculoskeletal system: Secondary | ICD-10-CM

## 2019-06-26 DIAGNOSIS — R531 Weakness: Secondary | ICD-10-CM

## 2019-06-26 DIAGNOSIS — Z23 Encounter for immunization: Secondary | ICD-10-CM | POA: Diagnosis not present

## 2019-07-04 ENCOUNTER — Other Ambulatory Visit: Payer: Self-pay

## 2019-07-04 ENCOUNTER — Ambulatory Visit (INDEPENDENT_AMBULATORY_CARE_PROVIDER_SITE_OTHER): Payer: Medicare Other | Admitting: Neurology

## 2019-07-04 ENCOUNTER — Encounter: Payer: Self-pay | Admitting: Neurology

## 2019-07-04 ENCOUNTER — Telehealth: Payer: Self-pay

## 2019-07-04 ENCOUNTER — Telehealth: Payer: Self-pay | Admitting: Neurology

## 2019-07-04 VITALS — BP 110/60 | HR 74 | Ht 70.0 in | Wt 186.0 lb

## 2019-07-04 DIAGNOSIS — G6181 Chronic inflammatory demyelinating polyneuritis: Secondary | ICD-10-CM

## 2019-07-04 DIAGNOSIS — R269 Unspecified abnormalities of gait and mobility: Secondary | ICD-10-CM

## 2019-07-04 DIAGNOSIS — R29898 Other symptoms and signs involving the musculoskeletal system: Secondary | ICD-10-CM

## 2019-07-04 DIAGNOSIS — R531 Weakness: Secondary | ICD-10-CM

## 2019-07-04 MED ORDER — SODIUM CHLORIDE 0.9 % IV SOLN: 1000.0000 mg | INTRAVENOUS | Status: AC

## 2019-07-04 NOTE — Patient Instructions (Addendum)
Continue steroids for 3 more cycles every 21 days  Refer to [facility] for wrist splints.  You can pick up soft elbow pad for your elbows.  Return to clinic in 6 weeks    Hand Exercises Hand exercises can be helpful for almost anyone. These exercises can strengthen the hands, improve flexibility and movement, and increase blood flow to the hands. These results can make work and daily tasks easier. Hand exercises can be especially helpful for people who have joint pain from arthritis or have nerve damage from overuse (carpal tunnel syndrome). These exercises can also help people who have injured a hand. Exercises Most of these hand exercises are gentle stretching and motion exercises. It is usually safe to do them often throughout the day. Warming up your hands before exercise may help to reduce stiffness. You can do this with gentle massage or by placing your hands in warm water for 10-15 minutes. It is normal to feel some stretching, pulling, tightness, or mild discomfort as you begin new exercises. This will gradually improve. Stop an exercise right away if you feel sudden, severe pain or your pain gets worse. Ask your health care provider which exercises are best for you. Knuckle bend or "claw" fist 1. Stand or sit with your arm, hand, and all five fingers pointed straight up. Make sure to keep your wrist straight during the exercise. 2. Gently bend your fingers down toward your palm until the tips of your fingers are touching the top of your palm. Keep your big knuckle straight and just bend the small knuckles in your fingers. 3. Hold this position for __________ seconds. 4. Straighten (extend) your fingers back to the starting position. Repeat this exercise 5-10 times with each hand. Full finger fist 1. Stand or sit with your arm, hand, and all five fingers pointed straight up. Make sure to keep your wrist straight during the exercise. 2. Gently bend your fingers into your palm until the tips  of your fingers are touching the middle of your palm. 3. Hold this position for __________ seconds. 4. Extend your fingers back to the starting position, stretching every joint fully. Repeat this exercise 5-10 times with each hand. Straight fist 1. Stand or sit with your arm, hand, and all five fingers pointed straight up. Make sure to keep your wrist straight during the exercise. 2. Gently bend your fingers at the big knuckle, where your fingers meet your hand, and the middle knuckle. Keep the knuckle at the tips of your fingers straight and try to touch the bottom of your palm. 3. Hold this position for __________ seconds. 4. Extend your fingers back to the starting position, stretching every joint fully. Repeat this exercise 5-10 times with each hand. Tabletop 1. Stand or sit with your arm, hand, and all five fingers pointed straight up. Make sure to keep your wrist straight during the exercise. 2. Gently bend your fingers at the big knuckle, where your fingers meet your hand, as far down as you can while keeping the small knuckles in your fingers straight. Think of forming a tabletop with your fingers. 3. Hold this position for __________ seconds. 4. Extend your fingers back to the starting position, stretching every joint fully. Repeat this exercise 5-10 times with each hand. Finger spread 1. Place your hand flat on a table with your palm facing down. Make sure your wrist stays straight as you do this exercise. 2. Spread your fingers and thumb apart from each other as far as  you can until you feel a gentle stretch. Hold this position for __________ seconds. 3. Bring your fingers and thumb tight together again. Hold this position for __________ seconds. Repeat this exercise 5-10 times with each hand. Making circles 1. Stand or sit with your arm, hand, and all five fingers pointed straight up. Make sure to keep your wrist straight during the exercise. 2. Make a circle by touching the tip of  your thumb to the tip of your index finger. 3. Hold for __________ seconds. Then open your hand wide. 4. Repeat this motion with your thumb and each finger on your hand. Repeat this exercise 5-10 times with each hand. Thumb motion 1. Sit with your forearm resting on a table and your wrist straight. Your thumb should be facing up toward the ceiling. Keep your fingers relaxed as you move your thumb. 2. Lift your thumb up as high as you can toward the ceiling. Hold for __________ seconds. 3. Bend your thumb across your palm as far as you can, reaching the tip of your thumb for the small finger (pinkie) side of your palm. Hold for __________ seconds. Repeat this exercise 5-10 times with each hand. Grip strengthening  1. Hold a stress ball or other soft ball in the middle of your hand. 2. Slowly increase the pressure, squeezing the ball as much as you can without causing pain. Think of bringing the tips of your fingers into the middle of your palm. All of your finger joints should bend when doing this exercise. 3. Hold your squeeze for __________ seconds, then relax. Repeat this exercise 5-10 times with each hand. Contact a health care provider if:  Your hand pain or discomfort gets much worse when you do an exercise.  Your hand pain or discomfort does not improve within 2 hours after you exercise. If you have any of these problems, stop doing these exercises right away. Do not do them again unless your health care provider says that you can. Get help right away if:  You develop sudden, severe hand pain or swelling. If this happens, stop doing these exercises right away. Do not do them again unless your health care provider says that you can. This information is not intended to replace advice given to you by your health care provider. Make sure you discuss any questions you have with your health care provider. Document Released: 09/20/2015 Document Revised: 01/30/2019 Document Reviewed:  10/10/2018 Elsevier Patient Education  2020 [employer].

## 2019-07-04 NOTE — Telephone Encounter (Signed)
Patient left a VM returning [facility] call

## 2019-07-04 NOTE — Telephone Encounter (Signed)
Patient is scheduled for Monday at 8am left message to call office. Scheduled at patient care

## 2019-07-04 NOTE — Progress Notes (Signed)
Follow-up Visit   Date: 07/04/19   Richard Lynch MRN: [id number] DOB: August 14, 1951   Interim History: Richard Lynch is a 68 y.o. left-handed Caucasian male with hypertension, hyperlipidemia, and tobacco use  returning to the clinic for follow-up of CIDP.  The patient was accompanied to the clinic by self.  History of present illness: Starting in 2016, he began having bilateral shoulder pain and hand tingling which steadily progressed over the years.  In February 2019, he had cervical decompression and fusion at C5-6 to T1-2 in [place] which helped his shoulder pain and hand tingling.   Over the course of the next few months, he developed clawing of the left hand, and difficulty with extending the fingers.  In October 2019, he underwent electrodiagnostic testing performed by [name] at [facility] which was interpreted as severe subacute left C8 radiculopathy, left median neuropathy at the wrist, and possible left ulnar at the wrist.   Based on these findings, he had left carpal tunnel release and Guyon canal release as well as anterior interosseous nerve transfer in December 2019.  Unfortunately, he has had not had any improvement and continues to have persistent numbness/tingling and weakness, especially with finger extension and grip on the left hand.   He also reports about a 2-year history of bilateral feet numbness below the level of the ankle.  Symptoms are constant.  He continues to have constant numbness over the fingers on both hands.  His grip is very weak on the left and he is starting to experience similar symptoms in the right hand.  He has a 20-year history of drinking 2-3 alcoholic beverages nightly.  He also has a 42-year history of smoking and is down to half a pack daily.  He previously had history of prediabetes which was corrected with lifestyle modifications.  He reports that his mother had neuropathy and similar symptoms late in her life.  She was also  diabetic.  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   UPDATE 07/04/2019: He is here for follow-up visit.  Since his last evaluation, patient was diagnosed with chronic inflammatory demyelinating polyradiculoneuropathy after his electrodiagnostic and CSF findings are consistent with this.  He had loading dose of Solu-Medrol 1 g at the end of July and had maintenance dose in August.  He denies any significant improvement in strength or paresthesias, and is frustrated by this.  He denies any new neurological symptoms.  Medications:  Current Outpatient Medications on File Prior to Visit  Medication Sig Dispense Refill  . amphetamine-dextroamphetamine (ADDERALL) 20 MG tablet Take 20 mg by mouth daily. Takes 2.5 tabs qd prn    . Ascorbic Acid (VITAMIN C) 100 MG tablet Take 100 mg by mouth daily.    Marland Kitchen aspirin 81 MG tablet Take 81 mg by mouth daily.    Marland Kitchen atenolol (TENORMIN) 25 MG tablet Take 25 mg by mouth daily.     Marland Kitchen atorvastatin (LIPITOR) 40 MG tablet Take 40 mg by mouth daily.    Marland Kitchen buPROPion (ZYBAN) 150 MG 12 hr tablet Take 150 mg by mouth 2 (two) times daily.    . calcium carbonate (OS-CAL) 600 MG TABS tablet Take by mouth.    . candesartan (ATACAND) 32 MG tablet Take 32 mg by mouth daily.     . celecoxib (CELEBREX) 200 MG capsule 1 tab po q day with food for pain and  swelling 30 capsule 0  . chlorthalidone (HYGROTON) 25 MG tablet Take  25 mg by mouth daily.     . Cholecalciferol (VITAMIN D3 PO) Take 2,000 Units by mouth 2 (two) times daily.    . Coenzyme Q10 (COQ-10 PO) Take 300 mg by mouth daily.    . fenofibrate 160 MG tablet Take 160 mg by mouth daily.     . Omega-3 Fatty Acids (FISH OIL PO) Take 2,400 mg by mouth daily.    . valACYclovir (VALTREX) 500 MG tablet Take 500 mg by mouth daily.     . vitamin B-12 (CYANOCOBALAMIN) 1000 MCG tablet Take 1,000 mcg by mouth daily.     Current Facility-Administered Medications on File Prior to Visit  Medication Dose Route Frequency  Provider Last Rate Last Dose  . methylPREDNISolone sodium succinate (SOLU-MEDROL) 1,000 mg in sodium chloride 0.9 % 100 mL IVPB  1,000 mg Intravenous Q28 days Toben Acuna K, DO        Allergies: No Known Allergies  Review of Systems:  CONSTITUTIONAL: No fevers, chills, night sweats, or weight loss.  EYES: No visual changes or eye pain ENT: No hearing changes.  No history of nose bleeds.   RESPIRATORY: No cough, wheezing and shortness of breath.   CARDIOVASCULAR: Negative for chest pain, and palpitations.   GI: Negative for abdominal discomfort, blood in stools or black stools.  No recent change in bowel habits.   GU:  No history of incontinence.   MUSCLOSKELETAL: No history of joint pain or swelling.  No myalgias.   SKIN: Negative for lesions, rash, and itching.   ENDOCRINE: Negative for cold or heat intolerance, polydipsia or goiter.   PSYCH:  No depression or anxiety symptoms.   NEURO: As Above.   Vital Signs:  BP 110/60   Pulse 74   Ht '5\' 10"'$  (1.778 m)   Wt 186 lb (84.4 kg)   SpO2 96%   BMI 26.69 kg/m    Neurological Exam: MENTAL STATUS including orientation to time, place, person, recent and remote memory, attention span and concentration, language, and fund of knowledge is normal.  Speech is not dysarthric.  CRANIAL NERVES:  Pupils equal round and reactive to light.  Normal conjugate, extra-ocular eye movements in all directions of gaze.  No ptosis.  MOTOR: Marked atrophy of the left intrinsic hand muscles. Left claw hand deformity. No  fasciculations or abnormal movements.  No pronator drift.   Upper Extremity:  Right  Left  Deltoid  5/5   5/5   Biceps  5/5   5/5   Triceps  5/5   5/5   Infraspinatus 5/5  5/5  Medial pectoralis 5/5  5/5  Wrist extensors  5/5   5/5   Wrist flexors  5/5   5/5   Finger extensors  5/5   2+/5   Finger flexors * 5-/5   4+/5   Dorsal interossei * 5-/5   3+/5   Abductor pollicis * 5-/5   1/5   Tone (Ashworth scale)  0  0   Lower  Extremity:  Right  Left  Hip flexors  5/5   5/5   Hip extensors  5/5   5/5   Adductor 5/5  5/5  Abductor 5/5  5/5  Knee flexors  5/5   5/5   Knee extensors  5/5   5/5   Dorsiflexors  5/5   5/5   Plantarflexors  5/5   5/5   Toe extensors  5/5   5/5   Toe flexors  5/5   5/5   Tone (Ashworth  scale)  0  0    MSRs:                                           Right        Left brachioradialis 2+  2+  biceps 2+  2+  triceps 2+  2+  patellar tr  1+  ankle jerk 0  0   SENSORY: Vibration continues to remain strongest at ankles, elbow, and MCP bilaterally.  Vibration is ~40% at the toes (improved).  Temperature is reduced over the dorsum of the feet and palms bilaterally  COORDINATION/GAIT: Gait narrow based and stable.   Data: DATA: NCS/EMG of the legs 04/17/2019: The electrophysiologic findings are consistent with a sensorimotor polyneuropathy, demyelinating and axonal loss in type affecting the lower extremities.  The presence of sural-sparing suggests findings may be associated with a polyradiculoneuropathy, correlate clinically.  NCS/EMG of the arms 04/01/2019 This is a complex study of the upper extremities.  Findings are as follows:  1. Bilateral median neuropathy at or distal to the wrist (severe), consistent with a clinical diagnosis of carpal tunnel syndrome.   2. Bilateral ulnar neuropathy with slowing across the elbow, mild on the right and severe on the left. 3. Chronic left p2osterior interosseous neuropathy, very severe.  4. Chronic C7-8 radiculopathy affecting bilateral upper extremities, moderate in degree electrically.  MRI cervical spine wo contrast 07/04/2018: 1. C5-T3 posterior-lateral fusion with decompressive laminectomy from C5-6 to T1-2. There is no arthrodesis by CT and the C5 and C6 lateral mass screws are loose. 2. Disc and facet degeneration causes multilevel foraminal impingement. Greatest foraminal impingement is seen on the left at C2-3, bilaterally at C3-4,  right at C5-6, and right at T1-2. 3. Widely patent canal at levels of laminectomy. Noncompressive spinal stenosis seen at C3-4 and C4-5. 4. Septated fluid collection within the lower laminectomy defect without dural mass effect, best attributed to seroma.  MRI thoracic spine wo contrast 07/05/2018: Cervical and thoracic fusion and laminectomy. 15 mm fluid collection in the laminectomy bed. No cord compression or cord signal abnormality. Left paracentral disc protrusion T7-8 unchanged from the prior MRI.  CSF testing 05/08/2019:   W0 R1 G73 P105*, 2 oligoclonal bands, normal IgG index, MBP and ACE  Labs 04/17/2019: ESR 6, vitamin B1 28, TSH 1.26, copper 106, SPEP with IFE no M protein, vitamin B12 >2000, folate 19.6   IMPRESSION: Chronic inflammatory demyelinating polyradiculoneuropathy affecting arms > legs as seen by demyelinating and axonal loss changes on EMG and supported by spinal analysis which shows a albuminocytologic dissociation.  He was started on Solu-Medrol at the end of July 2020. Although patient does not perceive any significant improvement, the fact that his reflexes are normalizing in the arms is a very good prognostic indicator.  Further, there is improvement in the right hand with motor strength testing.  Unfortunately, the left hand remains quite weak and will take much longer to see improvement. I will adjust his Solu-Medrol to 1 g every 21 days Consider IVIG going forward if he does not have any ongoing improvement with steroids. Start occupational therapy Refer to [facility] for wrist splints to help support the hands  I will reassess him in 6 weeks   Greater than 50% of this 40 minute visit was spent in counseling, explanation of diagnosis, planning of further management, and coordination of care.   Thank  you for allowing me to participate in patient's care.  If I can answer any additional questions, I would be pleased to do so.    Sincerely,    Mileidy Atkin K. Posey Pronto, DO

## 2019-07-07 ENCOUNTER — Ambulatory Visit: Payer: Medicare Other | Admitting: Neurology

## 2019-07-07 ENCOUNTER — Encounter (HOSPITAL_COMMUNITY): Payer: Medicare Other

## 2019-07-14 DIAGNOSIS — M62542 Muscle wasting and atrophy, not elsewhere classified, left hand: Secondary | ICD-10-CM | POA: Diagnosis not present

## 2019-07-14 DIAGNOSIS — M25642 Stiffness of left hand, not elsewhere classified: Secondary | ICD-10-CM | POA: Diagnosis not present

## 2019-07-14 DIAGNOSIS — R278 Other lack of coordination: Secondary | ICD-10-CM | POA: Diagnosis not present

## 2019-07-14 DIAGNOSIS — M62442 Contracture of muscle, left hand: Secondary | ICD-10-CM | POA: Diagnosis not present

## 2019-07-15 ENCOUNTER — Other Ambulatory Visit: Payer: Self-pay

## 2019-07-15 ENCOUNTER — Ambulatory Visit (HOSPITAL_COMMUNITY)
Admission: RE | Admit: 2019-07-15 | Discharge: 2019-07-15 | Disposition: A | Discharge status: Home / Self Care | Payer: Medicare Other | Source: Ambulatory Visit | Attending: Internal Medicine | Admitting: Internal Medicine

## 2019-07-15 DIAGNOSIS — G6181 Chronic inflammatory demyelinating polyneuritis: Secondary | ICD-10-CM | POA: Diagnosis not present

## 2019-07-15 MED ORDER — SODIUM CHLORIDE 0.9 % IV SOLN
1000.0000 mg | INTRAVENOUS | Status: DC
  Administered 2019-07-15: 1000 mg via INTRAVENOUS
  Filled 2019-07-15: qty 1000

## 2019-07-15 MED ORDER — SODIUM CHLORIDE 0.9 % IV SOLN
INTRAVENOUS | Status: DC | PRN
  Administered 2019-07-15: 250 mL via INTRAVENOUS

## 2019-07-15 NOTE — Discharge Instructions (Signed)
Methylprednisolone Solution for Injection What is this medicine? METHYLPREDNISOLONE (meth ill pred NISS oh lone) is a corticosteroid. It is commonly used to treat inflammation of the skin, joints, lungs, and other organs. Common conditions treated include asthma, allergies, and arthritis. It is also used for other conditions, such as blood disorders and diseases of the adrenal glands. This medicine may be used for other purposes; ask your health care provider or pharmacist if you have questions. COMMON BRAND NAME(S): A-Methapred, Solu-Medrol What should I tell my health care provider before I take this medicine? They need to know if you have any of these conditions:  Cushing's syndrome  eye disease, vision problems  diabetes  glaucoma  heart disease  high blood pressure  infection (especially a virus infection such as chickenpox, cold sores, or herpes)  liver disease  mental illness  myasthenia gravis  osteoporosis  recently received or scheduled to receive a vaccine  seizures  stomach or intestine problems  thyroid disease  an unusual or allergic reaction to lactose, methylprednisolone, other medicines, foods, dyes, or preservatives  pregnant or trying to get pregnant  breast-feeding How should I use this medicine? This medicine is for injection or infusion into a vein. It is also for injection into a muscle. It is given by a health care professional in a hospital or clinic setting. Talk to your pediatrician regarding the use of this medicine in children. While this drug may be prescribed for selected conditions, precautions do apply. Overdosage: If you think you have taken too much of this medicine contact a poison control center or emergency room at once. NOTE: This medicine is only for you. Do not share this medicine with others. What if I miss a dose? This does not apply. What may interact with this medicine? Do not take this medicine with any of the following  medications:  alefacept  echinacea  iopamidol  live virus vaccines  metyrapone  mifepristone This medicine may also interact with the following medications:  amphotericin B  aspirin and aspirin-like medicines  certain antibiotics like erythromycin, clarithromycin, troleandomycin  certain medicines for diabetes  certain medicines for fungal infection like ketoconazole  certain medicines for seizures like carbamazepine, phenobarbital, phenytoin  certain medicines that treat or prevent blood clots like warfarin  cyclosporine  digoxin  diuretics  male hormones, like estrogens and birth control pills  isoniazid  NSAIDS, medicines for pain and inflammation, like ibuprofen or naproxen  other medicines for myasthenia gravis  rifampin  vaccines This list may not describe all possible interactions. Give your health care provider a list of all the medicines, herbs, non-prescription drugs, or dietary supplements you use. Also tell them if you smoke, drink alcohol, or use illegal drugs. Some items may interact with your medicine. What should I watch for while using this medicine? Tell your doctor or healthcare professional if your symptoms do not start to get better or if they get worse. Do not stop taking except on your doctor's advice. You may develop a severe reaction. Your doctor will tell you how much medicine to take. Your condition will be monitored carefully while you are receiving this medicine. This medicine may increase your risk of getting an infection. Tell your doctor or health care professional if you are around anyone with measles or chickenpox, or if you develop sores or blisters that do not heal properly. This medicine may increase blood sugar. Ask your healthcare provider if changes in diet or medicines are needed if you have diabetes. Tell  your doctor or health care professional right away if you have any change in your eyesight. Using this medicine for a  long time may increase your risk of low bone mass. Talk to your doctor about bone health. What side effects may I notice from receiving this medicine? Side effects that you should report to your doctor or health care professional as soon as possible:  allergic reactions like skin rash, itching or hives, swelling of the face, lips, or tongue  bloody or tarry stools  hallucination, loss of contact with reality  muscle cramps  muscle pain  palpitations  signs and symptoms of high blood sugar such as being more thirsty or hungry or having to urinate more than normal. You may also feel very tired or have blurry vision.  signs and symptoms of infection like fever or chills; cough; sore throat; pain or trouble passing urine  trouble passing urine Side effects that usually do not require medical attention (report to your doctor or health care professional if they continue or are bothersome):  changes in emotions or mood  constipation  diarrhea  excessive hair growth on the face or body  headache  nausea, vomiting  pain, redness, or irritation at site where injected  trouble sleeping  weight gain This list may not describe all possible side effects. Call your doctor for medical advice about side effects. You may report side effects to FDA at 1-800-FDA-1088. Where should I keep my medicine? This drug is given in a hospital or clinic and will not be stored at home. NOTE: This sheet is a summary. It may not cover all possible information. If you have questions about this medicine, talk to your doctor, pharmacist, or health care provider.  2020 Elsevier/Gold Standard (2018-07-11 09:12:19)

## 2019-07-15 NOTE — Progress Notes (Signed)
Patient received Solu-medrol via PIV as ordered.Tolerated well, vitals stable, discharge instructions given, verbalized understanding. Patient alert, oriented and ambulatory at the time of discharge.

## 2019-07-16 DIAGNOSIS — M25522 Pain in left elbow: Secondary | ICD-10-CM | POA: Diagnosis not present

## 2019-07-16 DIAGNOSIS — M25532 Pain in left wrist: Secondary | ICD-10-CM | POA: Diagnosis not present

## 2019-07-17 DIAGNOSIS — M25532 Pain in left wrist: Secondary | ICD-10-CM | POA: Diagnosis not present

## 2019-07-17 DIAGNOSIS — M6281 Muscle weakness (generalized): Secondary | ICD-10-CM | POA: Diagnosis not present

## 2019-07-17 DIAGNOSIS — M25522 Pain in left elbow: Secondary | ICD-10-CM | POA: Diagnosis not present

## 2019-07-17 DIAGNOSIS — G6181 Chronic inflammatory demyelinating polyneuritis: Secondary | ICD-10-CM | POA: Diagnosis not present

## 2019-07-22 DIAGNOSIS — G6181 Chronic inflammatory demyelinating polyneuritis: Secondary | ICD-10-CM | POA: Diagnosis not present

## 2019-07-22 DIAGNOSIS — M6281 Muscle weakness (generalized): Secondary | ICD-10-CM | POA: Diagnosis not present

## 2019-07-22 DIAGNOSIS — M25522 Pain in left elbow: Secondary | ICD-10-CM | POA: Diagnosis not present

## 2019-07-22 DIAGNOSIS — M25532 Pain in left wrist: Secondary | ICD-10-CM | POA: Diagnosis not present

## 2019-07-24 DIAGNOSIS — M6281 Muscle weakness (generalized): Secondary | ICD-10-CM | POA: Diagnosis not present

## 2019-07-24 DIAGNOSIS — M25532 Pain in left wrist: Secondary | ICD-10-CM | POA: Diagnosis not present

## 2019-07-24 DIAGNOSIS — M25522 Pain in left elbow: Secondary | ICD-10-CM | POA: Diagnosis not present

## 2019-07-24 DIAGNOSIS — G6181 Chronic inflammatory demyelinating polyneuritis: Secondary | ICD-10-CM | POA: Diagnosis not present

## 2019-07-29 DIAGNOSIS — G6181 Chronic inflammatory demyelinating polyneuritis: Secondary | ICD-10-CM | POA: Diagnosis not present

## 2019-07-29 DIAGNOSIS — M6281 Muscle weakness (generalized): Secondary | ICD-10-CM | POA: Diagnosis not present

## 2019-07-29 DIAGNOSIS — M25532 Pain in left wrist: Secondary | ICD-10-CM | POA: Diagnosis not present

## 2019-07-29 DIAGNOSIS — M25522 Pain in left elbow: Secondary | ICD-10-CM | POA: Diagnosis not present

## 2019-07-30 DIAGNOSIS — M25522 Pain in left elbow: Secondary | ICD-10-CM | POA: Diagnosis not present

## 2019-07-30 DIAGNOSIS — M6281 Muscle weakness (generalized): Secondary | ICD-10-CM | POA: Diagnosis not present

## 2019-07-30 DIAGNOSIS — G6181 Chronic inflammatory demyelinating polyneuritis: Secondary | ICD-10-CM | POA: Diagnosis not present

## 2019-07-30 DIAGNOSIS — M25532 Pain in left wrist: Secondary | ICD-10-CM | POA: Diagnosis not present

## 2019-08-04 ENCOUNTER — Other Ambulatory Visit: Payer: Self-pay

## 2019-08-04 ENCOUNTER — Ambulatory Visit (HOSPITAL_COMMUNITY)
Admission: RE | Admit: 2019-08-04 | Discharge: 2019-08-04 | Disposition: A | Discharge status: Home / Self Care | Payer: Medicare Other | Source: Ambulatory Visit | Attending: Internal Medicine | Admitting: Internal Medicine

## 2019-08-04 DIAGNOSIS — G6181 Chronic inflammatory demyelinating polyneuritis: Secondary | ICD-10-CM | POA: Diagnosis not present

## 2019-08-04 MED ORDER — SODIUM CHLORIDE 0.9 % IV SOLN
1000.0000 mg | INTRAVENOUS | Status: DC
  Administered 2019-08-04: 1000 mg via INTRAVENOUS
  Filled 2019-08-04: qty 8

## 2019-08-04 MED ORDER — SODIUM CHLORIDE 0.9 % IV SOLN
INTRAVENOUS | Status: DC | PRN
  Administered 2019-08-04: 250 mL via INTRAVENOUS

## 2019-08-04 NOTE — Discharge Instructions (Signed)
Methylprednisolone Solution for Injection What is this medicine? METHYLPREDNISOLONE (meth ill pred NISS oh lone) is a corticosteroid. It is commonly used to treat inflammation of the skin, joints, lungs, and other organs. Common conditions treated include asthma, allergies, and arthritis. It is also used for other conditions, such as blood disorders and diseases of the adrenal glands. This medicine may be used for other purposes; ask your health care provider or pharmacist if you have questions. COMMON BRAND NAME(S): A-Methapred, Solu-Medrol What should I tell my health care provider before I take this medicine? They need to know if you have any of these conditions:  Cushing's syndrome  eye disease, vision problems  diabetes  glaucoma  heart disease  high blood pressure  infection (especially a virus infection such as chickenpox, cold sores, or herpes)  liver disease  mental illness  myasthenia gravis  osteoporosis  recently received or scheduled to receive a vaccine  seizures  stomach or intestine problems  thyroid disease  an unusual or allergic reaction to lactose, methylprednisolone, other medicines, foods, dyes, or preservatives  pregnant or trying to get pregnant  breast-feeding How should I use this medicine? This medicine is for injection or infusion into a vein. It is also for injection into a muscle. It is given by a health care professional in a hospital or clinic setting. Talk to your pediatrician regarding the use of this medicine in children. While this drug may be prescribed for selected conditions, precautions do apply. Overdosage: If you think you have taken too much of this medicine contact a poison control center or emergency room at once. NOTE: This medicine is only for you. Do not share this medicine with others. What if I miss a dose? This does not apply. What may interact with this medicine? Do not take this medicine with any of the following  medications:  alefacept  echinacea  iopamidol  live virus vaccines  metyrapone  mifepristone This medicine may also interact with the following medications:  amphotericin B  aspirin and aspirin-like medicines  certain antibiotics like erythromycin, clarithromycin, troleandomycin  certain medicines for diabetes  certain medicines for fungal infection like ketoconazole  certain medicines for seizures like carbamazepine, phenobarbital, phenytoin  certain medicines that treat or prevent blood clots like warfarin  cyclosporine  digoxin  diuretics  male hormones, like estrogens and birth control pills  isoniazid  NSAIDS, medicines for pain and inflammation, like ibuprofen or naproxen  other medicines for myasthenia gravis  rifampin  vaccines This list may not describe all possible interactions. Give your health care provider a list of all the medicines, herbs, non-prescription drugs, or dietary supplements you use. Also tell them if you smoke, drink alcohol, or use illegal drugs. Some items may interact with your medicine. What should I watch for while using this medicine? Tell your doctor or healthcare professional if your symptoms do not start to get better or if they get worse. Do not stop taking except on your doctor's advice. You may develop a severe reaction. Your doctor will tell you how much medicine to take. Your condition will be monitored carefully while you are receiving this medicine. This medicine may increase your risk of getting an infection. Tell your doctor or health care professional if you are around anyone with measles or chickenpox, or if you develop sores or blisters that do not heal properly. This medicine may increase blood sugar. Ask your healthcare provider if changes in diet or medicines are needed if you have diabetes. Tell  your doctor or health care professional right away if you have any change in your eyesight. Using this medicine for a  long time may increase your risk of low bone mass. Talk to your doctor about bone health. What side effects may I notice from receiving this medicine? Side effects that you should report to your doctor or health care professional as soon as possible:  allergic reactions like skin rash, itching or hives, swelling of the face, lips, or tongue  bloody or tarry stools  hallucination, loss of contact with reality  muscle cramps  muscle pain  palpitations  signs and symptoms of high blood sugar such as being more thirsty or hungry or having to urinate more than normal. You may also feel very tired or have blurry vision.  signs and symptoms of infection like fever or chills; cough; sore throat; pain or trouble passing urine  trouble passing urine Side effects that usually do not require medical attention (report to your doctor or health care professional if they continue or are bothersome):  changes in emotions or mood  constipation  diarrhea  excessive hair growth on the face or body  headache  nausea, vomiting  pain, redness, or irritation at site where injected  trouble sleeping  weight gain This list may not describe all possible side effects. Call your doctor for medical advice about side effects. You may report side effects to FDA at 1-800-FDA-1088. Where should I keep my medicine? This drug is given in a hospital or clinic and will not be stored at home. NOTE: This sheet is a summary. It may not cover all possible information. If you have questions about this medicine, talk to your doctor, pharmacist, or health care provider.  2020 Elsevier/Gold Standard (2018-07-11 09:12:19)

## 2019-08-04 NOTE — Progress Notes (Signed)
PATIENT CARE CENTER NOTE  Provider:Patel, Donika, DO   Procedure:Solu-Medrol infusion   Note:Patient receivedSolu-Medrol infusion via PIV. Tolerated well. Vital signs remained stable. Patient to come back every three weeks for infusion. Discharge instructions given. Patient alert, oriented and ambulatory at discharge.

## 2019-08-05 DIAGNOSIS — M25532 Pain in left wrist: Secondary | ICD-10-CM | POA: Diagnosis not present

## 2019-08-05 DIAGNOSIS — G6181 Chronic inflammatory demyelinating polyneuritis: Secondary | ICD-10-CM | POA: Diagnosis not present

## 2019-08-05 DIAGNOSIS — M25522 Pain in left elbow: Secondary | ICD-10-CM | POA: Diagnosis not present

## 2019-08-05 DIAGNOSIS — M6281 Muscle weakness (generalized): Secondary | ICD-10-CM | POA: Diagnosis not present

## 2019-08-06 DIAGNOSIS — M6281 Muscle weakness (generalized): Secondary | ICD-10-CM | POA: Diagnosis not present

## 2019-08-06 DIAGNOSIS — G6181 Chronic inflammatory demyelinating polyneuritis: Secondary | ICD-10-CM | POA: Diagnosis not present

## 2019-08-06 DIAGNOSIS — M25522 Pain in left elbow: Secondary | ICD-10-CM | POA: Diagnosis not present

## 2019-08-06 DIAGNOSIS — M25532 Pain in left wrist: Secondary | ICD-10-CM | POA: Diagnosis not present

## 2019-08-07 DIAGNOSIS — H2512 Age-related nuclear cataract, left eye: Secondary | ICD-10-CM | POA: Diagnosis not present

## 2019-08-11 ENCOUNTER — Ambulatory Visit
Admission: RE | Admit: 2019-08-11 | Discharge: 2019-08-11 | Disposition: A | Discharge status: Home / Self Care | Payer: Medicare Other | Source: Ambulatory Visit | Attending: Acute Care | Admitting: Acute Care

## 2019-08-11 ENCOUNTER — Other Ambulatory Visit: Payer: Self-pay

## 2019-08-11 DIAGNOSIS — Z122 Encounter for screening for malignant neoplasm of respiratory organs: Secondary | ICD-10-CM

## 2019-08-11 DIAGNOSIS — F17210 Nicotine dependence, cigarettes, uncomplicated: Secondary | ICD-10-CM | POA: Diagnosis not present

## 2019-08-11 DIAGNOSIS — Z87891 Personal history of nicotine dependence: Secondary | ICD-10-CM

## 2019-08-13 ENCOUNTER — Other Ambulatory Visit: Payer: Self-pay | Admitting: *Deleted

## 2019-08-13 DIAGNOSIS — M25522 Pain in left elbow: Secondary | ICD-10-CM | POA: Diagnosis not present

## 2019-08-13 DIAGNOSIS — F17210 Nicotine dependence, cigarettes, uncomplicated: Secondary | ICD-10-CM

## 2019-08-13 DIAGNOSIS — Z87891 Personal history of nicotine dependence: Secondary | ICD-10-CM

## 2019-08-13 DIAGNOSIS — M6281 Muscle weakness (generalized): Secondary | ICD-10-CM | POA: Diagnosis not present

## 2019-08-13 DIAGNOSIS — M25532 Pain in left wrist: Secondary | ICD-10-CM | POA: Diagnosis not present

## 2019-08-13 DIAGNOSIS — G6181 Chronic inflammatory demyelinating polyneuritis: Secondary | ICD-10-CM | POA: Diagnosis not present

## 2019-08-13 DIAGNOSIS — Z122 Encounter for screening for malignant neoplasm of respiratory organs: Secondary | ICD-10-CM

## 2019-08-18 ENCOUNTER — Ambulatory Visit: Payer: Medicare Other | Admitting: Neurology

## 2019-08-19 DIAGNOSIS — G6181 Chronic inflammatory demyelinating polyneuritis: Secondary | ICD-10-CM | POA: Diagnosis not present

## 2019-08-19 DIAGNOSIS — M25532 Pain in left wrist: Secondary | ICD-10-CM | POA: Diagnosis not present

## 2019-08-19 DIAGNOSIS — M25522 Pain in left elbow: Secondary | ICD-10-CM | POA: Diagnosis not present

## 2019-08-19 DIAGNOSIS — M6281 Muscle weakness (generalized): Secondary | ICD-10-CM | POA: Diagnosis not present

## 2019-08-22 ENCOUNTER — Ambulatory Visit (INDEPENDENT_AMBULATORY_CARE_PROVIDER_SITE_OTHER): Payer: Medicare Other | Admitting: Neurology

## 2019-08-22 ENCOUNTER — Other Ambulatory Visit: Payer: Self-pay

## 2019-08-22 ENCOUNTER — Telehealth: Payer: Self-pay | Admitting: *Deleted

## 2019-08-22 ENCOUNTER — Encounter: Payer: Self-pay | Admitting: Neurology

## 2019-08-22 ENCOUNTER — Encounter

## 2019-08-22 VITALS — BP 136/56 | HR 78 | Ht 70.0 in | Wt 190.0 lb

## 2019-08-22 DIAGNOSIS — G6181 Chronic inflammatory demyelinating polyneuritis: Secondary | ICD-10-CM | POA: Diagnosis not present

## 2019-08-22 NOTE — Progress Notes (Signed)
Follow-up Visit   Date: 08/22/19   Richard Lynch MRN: [id number] DOB: 1951-01-17   Interim History: Richard Lynch is a 68 y.o. left-handed Caucasian male with hypertension, hyperlipidemia, and tobacco use  returning to the clinic for follow-up of CIDP.  The patient was accompanied to the clinic by self.  History of present illness: Starting in 2016, he began having bilateral shoulder pain and hand tingling which steadily progressed over the years.  In February 2019, he had cervical decompression and fusion at C5-6 to T1-2 in [place] which helped his shoulder pain and hand tingling.   Over the course of the next few months, he developed clawing of the left hand, and difficulty with extending the fingers.  In October 2019, he underwent electrodiagnostic testing performed by [name] at [facility] which was interpreted as severe subacute left C8 radiculopathy, left median neuropathy at the wrist, and possible left ulnar at the wrist.   Based on these findings, he had left carpal tunnel release and Guyon canal release as well as anterior interosseous nerve transfer in December 2019.  Unfortunately, he has had not had any improvement and continues to have persistent numbness/tingling and weakness, especially with finger extension and grip on the left hand.   He also reports about a 2-year history of bilateral feet numbness below the level of the ankle.  Symptoms are constant.  He continues to have constant numbness over the fingers on both hands.  His grip is very weak on the left and he is starting to experience similar symptoms in the right hand.  He has a 20-year history of drinking 2-3 alcoholic beverages nightly.  He also has a 42-year history of smoking and is down to half a pack daily.  He previously had history of prediabetes which was corrected with lifestyle modifications.  He reports that his mother had neuropathy and similar symptoms late in her life.  She was also  diabetic.  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   UPDATE 07/04/2019: He is here for follow-up visit.  Since his last evaluation, patient was diagnosed with chronic inflammatory demyelinating polyradiculoneuropathy after his electrodiagnostic and CSF findings are consistent with this.  He had loading dose of Solu-Medrol 1 g at the end of July and had maintenance dose in August.  He denies any significant improvement in strength or paresthesias, and is frustrated by this.  He denies any new neurological symptoms.  UPDATE 08/22/2019:  He is here for follow-up visit.  Despite doing occupational therapy and Solumedrol 1g every 3 weeks, he denies any improvement in the strength of his left hand.  He feels like his right hand is back to normal strength.  He still has numbness in the fingers and feet.  He has not suffered any falls.  He has many questions about possible injections, alternative therapies, or role of surgery.     Medications:  Current Outpatient Medications on File Prior to Visit  Medication Sig Dispense Refill  . amphetamine-dextroamphetamine (ADDERALL) 20 MG tablet Take 20 mg by mouth daily. Takes 2.5 tabs qd prn    . Ascorbic Acid (VITAMIN C) 100 MG tablet Take 100 mg by mouth daily.    Marland Kitchen aspirin 81 MG tablet Take 81 mg by mouth daily.    Marland Kitchen atenolol (TENORMIN) 25 MG tablet Take 25 mg by mouth daily.     Marland Kitchen atorvastatin (LIPITOR) 40 MG tablet Take 40 mg by mouth daily.    Marland Kitchen  buPROPion (ZYBAN) 150 MG 12 hr tablet Take 150 mg by mouth 2 (two) times daily.    . calcium carbonate (OS-CAL) 600 MG TABS tablet Take by mouth.    . candesartan (ATACAND) 32 MG tablet Take 32 mg by mouth daily.     . celecoxib (CELEBREX) 200 MG capsule 1 tab po q day with food for pain and  swelling 30 capsule 0  . chlorthalidone (HYGROTON) 25 MG tablet Take 25 mg by mouth daily.     . Cholecalciferol (VITAMIN D3 PO) Take 2,000 Units by mouth 2 (two) times daily.    . Coenzyme Q10 (COQ-10 PO) Take  300 mg by mouth daily.    . fenofibrate 160 MG tablet Take 160 mg by mouth daily.     . Omega-3 Fatty Acids (FISH OIL PO) Take 2,400 mg by mouth daily.    . valACYclovir (VALTREX) 500 MG tablet Take 500 mg by mouth daily.     . vitamin B-12 (CYANOCOBALAMIN) 1000 MCG tablet Take 1,000 mcg by mouth daily.     Current Facility-Administered Medications on File Prior to Visit  Medication Dose Route Frequency Provider Last Rate Last Dose  . methylPREDNISolone sodium succinate (SOLU-MEDROL) 1,000 mg in sodium chloride 0.9 % 100 mL IVPB  1,000 mg Intravenous Q28 days Patel, Donika K, DO        Allergies: No Known Allergies  Review of Systems:  CONSTITUTIONAL: No fevers, chills, night sweats, or weight loss.  EYES: No visual changes or eye pain ENT: No hearing changes.  No history of nose bleeds.   RESPIRATORY: No cough, wheezing and shortness of breath.   CARDIOVASCULAR: Negative for chest pain, and palpitations.   GI: Negative for abdominal discomfort, blood in stools or black stools.  No recent change in bowel habits.   GU:  No history of incontinence.   MUSCLOSKELETAL: No history of joint pain or swelling.  No myalgias.   SKIN: Negative for lesions, rash, and itching.   ENDOCRINE: Negative for cold or heat intolerance, polydipsia or goiter.   PSYCH:  No depression or anxiety symptoms.   NEURO: As Above.   Vital Signs:  BP (!) 136/56   Pulse 78   Ht 5' 10" (1.778 m)   Wt 190 lb (86.2 kg)   SpO2 98%   BMI 27.26 kg/m    General Medical Exam:   General:  Well appearing, comfortable  Eyes/ENT: see cranial nerve examination.   Neck:   No carotid bruits. Respiratory:  Rhonchi at the bases bilaterally Cardiac:  Regular rate and rhythm, no murmur.   Ext:  No edema.  Clawing of the left hand  Neurological Exam: MENTAL STATUS including orientation to time, place, person, recent and remote memory, attention span and concentration, language, and fund of knowledge is normal.  Speech is not  dysarthric.  CRANIAL NERVES:  Pupils equal round and reactive to light.  Normal conjugate, extra-ocular eye movements in all directions of gaze.  No ptosis.  MOTOR: Marked atrophy of the left intrinsic hand muscles. Left claw hand deformity. No  fasciculations or abnormal movements.  No pronator drift.   Upper Extremity:  Right  Left  Deltoid  5/5   5/5   Biceps  5/5   5/5   Triceps  5/5   5/5   Infraspinatus 5/5  5/5  Medial pectoralis 5/5  5/5  Wrist extensors  5/5   5/5   Wrist flexors  5/5   5/5   Finger extensors  5/5   2+/5   Finger flexors * 5/5   4+/5   Dorsal interossei * 5/5   3+/5   Abductor pollicis * 5/5   1/5   Tone (Ashworth scale)  0  0   Lower Extremity:  Right  Left  Hip flexors  5/5   5/5   Hip extensors  5/5   5/5   Adductor 5/5  5/5  Abductor 5/5  5/5  Knee flexors  5/5   5/5   Knee extensors  5/5   5/5   Dorsiflexors  5/5   5/5   Plantarflexors  5/5   5/5   Toe extensors  5/5   5/5   Toe flexors  5/5   5/5   Tone (Ashworth scale)  0  0    MSRs:                                           Right        Left brachioradialis 2+  2+  biceps 2+  2+  triceps 2+  2+  patellar tr  1+  ankle jerk 0  0   SENSORY: Vibration continues to remain strongest at ankles, elbow, and MCP bilaterally.  Vibration is ~40% at the toe..  Temperature is reduced over the dorsum of the feet and palms bilaterally  COORDINATION/GAIT: Gait narrow based and stable.   Data: DATA: NCS/EMG of the legs 04/17/2019: The electrophysiologic findings are consistent with a sensorimotor polyneuropathy, demyelinating and axonal loss in type affecting the lower extremities.  The presence of sural-sparing suggests findings may be associated with a polyradiculoneuropathy, correlate clinically.  NCS/EMG of the arms 04/01/2019 This is a complex study of the upper extremities.  Findings are as follows:  1. Bilateral median neuropathy at or distal to the wrist (severe), consistent with a clinical  diagnosis of carpal tunnel syndrome.   2. Bilateral ulnar neuropathy with slowing across the elbow, mild on the right and severe on the left. 3. Chronic left p2osterior interosseous neuropathy, very severe.  4. Chronic C7-8 radiculopathy affecting bilateral upper extremities, moderate in degree electrically.  MRI cervical spine wo contrast 07/04/2018: 1. C5-T3 posterior-lateral fusion with decompressive laminectomy from C5-6 to T1-2. There is no arthrodesis by CT and the C5 and C6 lateral mass screws are loose. 2. Disc and facet degeneration causes multilevel foraminal impingement. Greatest foraminal impingement is seen on the left at C2-3, bilaterally at C3-4, right at C5-6, and right at T1-2. 3. Widely patent canal at levels of laminectomy. Noncompressive spinal stenosis seen at C3-4 and C4-5. 4. Septated fluid collection within the lower laminectomy defect without dural mass effect, best attributed to seroma.  MRI thoracic spine wo contrast 07/05/2018: Cervical and thoracic fusion and laminectomy. 15 mm fluid collection in the laminectomy bed. No cord compression or cord signal abnormality. Left paracentral disc protrusion T7-8 unchanged from the prior MRI.  CSF testing 05/08/2019:   W0 R1 G73 P105*, 2 oligoclonal bands, normal IgG index, MBP and ACE  Labs 04/17/2019: ESR 6, vitamin B1 28, TSH 1.26, copper 106, SPEP with IFE no M protein, vitamin B12 >2000, folate 19.6   IMPRESSION: Chronic inflammatory demyelinating polyradiculoneuropathy affecting arms > legs as seen by demyelinating and axonal loss changes on EMG and supported by spinal analysis which shows a albuminocytologic dissociation.  He was started on Solu-Medrol at the end of  July 2020.  Exam shows return of reflexes in the arm and full strength in the right hand (improved), but he continues to have marked weakness in the left hand. He will continue Solu-Medrol to 1 g next week, then transition to IVIG Start IVIG 5m/kg initiation,  followed by 165mkg every 21 days.  Side effects discussed.  Patient informed that effects of IVIG can take up to 6 months to appreciate, so I will plan to keep him on IVIG for at least 6 months and then reassess. He had many questions which was answered to the best of my ability.   Return to clinic in 3 months  Greater than 50% of this 30 minute visit was spent in counseling, explanation of diagnosis, planning of further management, and coordination of care.  Thank you for allowing me to participate in patient's care.  If I can answer any additional questions, I would be pleased to do so.    Sincerely,    Claudia Alvizo K. PaPosey ProntoDO

## 2019-08-22 NOTE — Patient Instructions (Signed)
Continue steroid treatment next week  We will transition to IVIG   Return to clinic in 3 months

## 2019-08-22 NOTE — Telephone Encounter (Addendum)
Per MD order patient to finish IV solumedrol (last dose is scheduled for next Monday Nov 2) then start IVIG the next week.  Order placed in EPIC for IVIG per MD order. Induction over a two day period 11/11 - 11/12. Then patient will receive IVIG 1 g/kg Q 21 days for 11 months.  Called and booked IVIG induction with Laverne at [facility] [phone]) for 0800 on 11/11 and 11/12. Called patient and made him aware that he needs to be at [facility] to check in at main admitting 0745 both days (needs to go to admitting prior to every infusion to get checked in). Patient verbalized understanding.  Asked Laverne at [facility] if could make a few of his Q 21 day appts and she stated that they don't do that until they know the patient didn't have a reaction to the induction. She stated that before he leaves on 11/12 they will give him a card with his future appts on it. Patient made aware of this as well.  Regarding PA for IVIG [facility] stated that the hospital has nothing to do with this and it all comes from the office. Will forward this message to Laureen Ochs who is currently doing the PAs for our office. Made patient aware we will be starting this process and this has to be completed prior to his first infusion.

## 2019-08-25 ENCOUNTER — Ambulatory Visit (HOSPITAL_COMMUNITY)
Admission: RE | Admit: 2019-08-25 | Discharge: 2019-08-25 | Disposition: A | Discharge status: Home / Self Care | Payer: Medicare Other | Source: Ambulatory Visit | Attending: Internal Medicine | Admitting: Internal Medicine

## 2019-08-25 ENCOUNTER — Other Ambulatory Visit: Payer: Self-pay

## 2019-08-25 ENCOUNTER — Encounter: Payer: Self-pay | Admitting: *Deleted

## 2019-08-25 DIAGNOSIS — G6181 Chronic inflammatory demyelinating polyneuritis: Secondary | ICD-10-CM | POA: Insufficient documentation

## 2019-08-25 MED ORDER — SODIUM CHLORIDE 0.9 % IV SOLN
INTRAVENOUS | Status: DC | PRN
  Administered 2019-08-25: 250 mL via INTRAVENOUS

## 2019-08-25 MED ORDER — SODIUM CHLORIDE 0.9 % IV SOLN
1000.0000 mg | Freq: Once | INTRAVENOUS | Status: AC
  Administered 2019-08-25: 09:00:00 1000 mg via INTRAVENOUS
  Filled 2019-08-25: qty 8

## 2019-08-25 NOTE — Discharge Instructions (Signed)
Methylprednisolone Solution for Injection What is this medicine? METHYLPREDNISOLONE (meth ill pred NISS oh lone) is a corticosteroid. It is commonly used to treat inflammation of the skin, joints, lungs, and other organs. Common conditions treated include asthma, allergies, and arthritis. It is also used for other conditions, such as blood disorders and diseases of the adrenal glands. This medicine may be used for other purposes; ask your health care provider or pharmacist if you have questions. COMMON BRAND NAME(S): A-Methapred, Solu-Medrol What should I tell my health care provider before I take this medicine? They need to know if you have any of these conditions:  Cushing's syndrome  eye disease, vision problems  diabetes  glaucoma  heart disease  high blood pressure  infection (especially a virus infection such as chickenpox, cold sores, or herpes)  liver disease  mental illness  myasthenia gravis  osteoporosis  recently received or scheduled to receive a vaccine  seizures  stomach or intestine problems  thyroid disease  an unusual or allergic reaction to lactose, methylprednisolone, other medicines, foods, dyes, or preservatives  pregnant or trying to get pregnant  breast-feeding How should I use this medicine? This medicine is for injection or infusion into a vein. It is also for injection into a muscle. It is given by a health care professional in a hospital or clinic setting. Talk to your pediatrician regarding the use of this medicine in children. While this drug may be prescribed for selected conditions, precautions do apply. Overdosage: If you think you have taken too much of this medicine contact a poison control center or emergency room at once. NOTE: This medicine is only for you. Do not share this medicine with others. What if I miss a dose? This does not apply. What may interact with this medicine? Do not take this medicine with any of the following  medications:  alefacept  echinacea  iopamidol  live virus vaccines  metyrapone  mifepristone This medicine may also interact with the following medications:  amphotericin B  aspirin and aspirin-like medicines  certain antibiotics like erythromycin, clarithromycin, troleandomycin  certain medicines for diabetes  certain medicines for fungal infection like ketoconazole  certain medicines for seizures like carbamazepine, phenobarbital, phenytoin  certain medicines that treat or prevent blood clots like warfarin  cyclosporine  digoxin  diuretics  male hormones, like estrogens and birth control pills  isoniazid  NSAIDS, medicines for pain and inflammation, like ibuprofen or naproxen  other medicines for myasthenia gravis  rifampin  vaccines This list may not describe all possible interactions. Give your health care provider a list of all the medicines, herbs, non-prescription drugs, or dietary supplements you use. Also tell them if you smoke, drink alcohol, or use illegal drugs. Some items may interact with your medicine. What should I watch for while using this medicine? Tell your doctor or healthcare professional if your symptoms do not start to get better or if they get worse. Do not stop taking except on your doctor's advice. You may develop a severe reaction. Your doctor will tell you how much medicine to take. Your condition will be monitored carefully while you are receiving this medicine. This medicine may increase your risk of getting an infection. Tell your doctor or health care professional if you are around anyone with measles or chickenpox, or if you develop sores or blisters that do not heal properly. This medicine may increase blood sugar. Ask your healthcare provider if changes in diet or medicines are needed if you have diabetes. Tell  your doctor or health care professional right away if you have any change in your eyesight. Using this medicine for a  long time may increase your risk of low bone mass. Talk to your doctor about bone health. What side effects may I notice from receiving this medicine? Side effects that you should report to your doctor or health care professional as soon as possible:  allergic reactions like skin rash, itching or hives, swelling of the face, lips, or tongue  bloody or tarry stools  hallucination, loss of contact with reality  muscle cramps  muscle pain  palpitations  signs and symptoms of high blood sugar such as being more thirsty or hungry or having to urinate more than normal. You may also feel very tired or have blurry vision.  signs and symptoms of infection like fever or chills; cough; sore throat; pain or trouble passing urine  trouble passing urine Side effects that usually do not require medical attention (report to your doctor or health care professional if they continue or are bothersome):  changes in emotions or mood  constipation  diarrhea  excessive hair growth on the face or body  headache  nausea, vomiting  pain, redness, or irritation at site where injected  trouble sleeping  weight gain This list may not describe all possible side effects. Call your doctor for medical advice about side effects. You may report side effects to FDA at 1-800-FDA-1088. Where should I keep my medicine? This drug is given in a hospital or clinic and will not be stored at home. NOTE: This sheet is a summary. It may not cover all possible information. If you have questions about this medicine, talk to your doctor, pharmacist, or health care provider.  2020 Elsevier/Gold Standard (2018-07-11 09:12:19)

## 2019-08-25 NOTE — Progress Notes (Signed)
Resulted in no PA required. Called Medicare number on back of card. Was told to call provider line. Asked for number [phone]. Called that number which is [facility] call ref # [id number].  [employer], she took me to  [email or web address] then to  [email or web address] In small red letters link to cms website [email or web address] Then find statement that says:  "The full list of HCPCS codes requiring prior authorization is available here (PDF).  CMS will post additional information about this program on this website. " Then click on the link and the following PDF appears: IF NOT ON THIS LIST THEN PA IS NOT REQUIRED  FINAL RULE: CMS-1717-FC: PRIOR AUTHORIZATION PROCESS and REQUIREMENTS for [facility] (OPD) SERVICES TABLE 65: FINAL LIST of [place] / Vol. 84, No. 218 / Tuesday, September 03, 2018 Code (i) Blepharoplasty, Eyelid Surgery, Brow Lift, and related services 15820 Removal of excessive skin of lower eyelid 15821 Removal of excessive skin of lower eyelid and fat around eye 15822 Removal of excessive skin of upper eyelid 15823 Removal of excessive skin and fat of upper eyelid 67900 Repair of brow paralysis 67901 Repair of upper eyelid muscle to correct drooping or paralysis 67902 Repair of upper eyelid muscle to correct drooping or paralysis 67903 Shortening or advancement of upper eyelid muscle to correct drooping or paralysis 67904 Repair of tendon of upper eyelid 67906 Suspension of upper eyelid muscle to correct drooping or paralysis 67908 Removal of tissue, muscle, and membrane to correct eyelid drooping or paralysis 67911 Correction of widely-opened upper eyelid Code (ii) Botulinum Toxin Injection (Procedure codes must be paired with the botulinum product code)1 64612 Injection of chemical for destruction  of nerve muscles on one side of face 64615 Injection of chemical for destruction of facial and neck nerve muscles on both sides of face J0585 Injection, onabotulinumtoxina, 1 unit  1 Prior authorization is only required when one of the required Botulinum Toxin codes [id number], [id number], [id number], or [id number]) is used in conjunction with the one of the required CPT injection codes [id number], injection of chemical for destruction of nerve muscles on one side of face, or 64615, injection of chemical for destruction of facial and neck nerve muscles on both sides of face). Use of these Botulinum Toxin codes in conjunction/paired with procedure codes other than [phone] or [phone] will not require prior authorization under this program.  J0586 Injection, abobotulinumtoxina J0587 Injection, rimabotulinumtoxinb, 100 units J0588 Injection, incobotulinumtoxin a Code (iii) Panniculectomy, Excision of Excess Skin and Subcutaneous Tissue (Including Lipectomy), and related services 15830 Excision, excessive skin and subcutaneous tissue (includes lipectomy); abdomen, infraumbilical panniculectomy 15847 Excision, excessive skin and subcutaneous tissue (includes lipectomy), abdomen (eg, abdominoplasty) (includes umbilical transposition and fascial plication) (list separately in addition to code for primary procedure) 15877 Suction assisted removal of fat from trunk Code (iv) Rhinoplasty, and related services 2 20912 Nasal cartilage graft 21210 Repair of nasal or cheek bone with bone graft 30400 Reshaping of tip of nose [id number] Reshaping of bone, cartilage, or tip of nose [id number] Reshaping of bony cartilage dividing nasal passages 30430 Revision to reshape nose or tip of nose after previous repair [id number] Revision to reshape nasal bones after previous repair [id number] Revision to reshape nasal bones and tip of nose after previous repair [id number] Repair of congenital nasal defect to lengthen tip of nose 30462 Repair of congenital nasal  defect with lengthening of tip of nose 30465 Widening of nasal passage 30520 Reshaping of nasal cartilage Code (  v) Vein Ablation, and related services [phone] Mechanochemical destruction of insufficient vein of arm or leg, accessed through the skin using imaging guidance [phone] Mechanochemical destruction of insufficient vein of arm or leg, accessed through the skin using imaging guidance  2 CPT 21235 (Obtaining ear cartilage for grafting) was removed on April 02, 2019.  36475 Destruction of insufficient vein of arm or leg, accessed through the skin 36476 Radiofrequency destruction of insufficient vein of arm or leg, accessed through the skin using imaging guidance [phone] Laser destruction of incompetent vein of arm or leg using imaging guidance, accessed through the skin 36479 Laser destruction of insufficient vein of arm or leg, accessed through the skin using imaging guidance [phone] Chemical destruction of incompetent vein of arm or leg, accessed through the skin using imaging guidance [phone] Chemical destruction of incompetent vein of arm or leg, accessed through the skin using imaging guidance   She said when the hospital issues a claim they will need to [place] so they will send all notes with the claim to receive reimbursement.

## 2019-08-25 NOTE — Progress Notes (Signed)
PATIENT CARE CENTER NOTE  Provider:Patel, Donika, DO   Procedure:Solu-Medrol infusion   Note:Patient receivedSolu-Medrol infusion via PIV. Tolerated well. Vital signs remained stable.  Discharge instructions given. Patient alert, oriented and ambulatory at discharge.

## 2019-08-31 DIAGNOSIS — I10 Essential (primary) hypertension: Secondary | ICD-10-CM | POA: Diagnosis not present

## 2019-08-31 DIAGNOSIS — J439 Emphysema, unspecified: Secondary | ICD-10-CM | POA: Diagnosis not present

## 2019-08-31 DIAGNOSIS — J449 Chronic obstructive pulmonary disease, unspecified: Secondary | ICD-10-CM | POA: Diagnosis not present

## 2019-08-31 DIAGNOSIS — E782 Mixed hyperlipidemia: Secondary | ICD-10-CM | POA: Diagnosis not present

## 2019-08-31 DIAGNOSIS — E7800 Pure hypercholesterolemia, unspecified: Secondary | ICD-10-CM | POA: Diagnosis not present

## 2019-09-03 ENCOUNTER — Other Ambulatory Visit: Payer: Self-pay

## 2019-09-03 ENCOUNTER — Ambulatory Visit (HOSPITAL_COMMUNITY)
Admission: RE | Admit: 2019-09-03 | Discharge: 2019-09-03 | Disposition: A | Discharge status: Home / Self Care | Payer: Medicare Other | Source: Ambulatory Visit | Attending: Neurology | Admitting: Neurology

## 2019-09-03 DIAGNOSIS — G6181 Chronic inflammatory demyelinating polyneuritis: Secondary | ICD-10-CM | POA: Diagnosis not present

## 2019-09-03 MED ORDER — IMMUNE GLOBULIN (HUMAN) 10 GM/100ML IV SOLN
1.0000 g/kg | INTRAVENOUS | Status: DC
  Administered 2019-09-03: 85 g via INTRAVENOUS
  Filled 2019-09-03: qty 800

## 2019-09-03 MED ORDER — IMMUNE GLOBULIN (HUMAN) 10 GM/100ML IV SOLN
1.0000 g/kg | INTRAVENOUS | Status: DC
  Filled 2019-09-03: qty 900

## 2019-09-03 NOTE — Discharge Instructions (Signed)
Immune Globulin Injection What is this medicine? IMMUNE GLOBULIN (im MUNE GLOB yoo lin) helps to prevent or reduce the severity of certain infections in patients who are at risk. This medicine is collected from the pooled blood of many donors. It is used to treat immune system problems, thrombocytopenia, and Kawasaki syndrome. This medicine may be used for other purposes; ask your health care provider or pharmacist if you have questions. COMMON BRAND NAME(S): ASCENIV, Baygam, BIVIGAM, Carimune, Carimune NF, cutaquig, Cuvitru, Flebogamma, Flebogamma DIF, GamaSTAN, GamaSTAN S/D, Gamimune N, Gammagard, Gammagard S/D, Gammaked, Gammaplex, Gammar-P IV, Gamunex, Gamunex-C, Hizentra, Iveegam, Iveegam EN, Octagam, Panglobulin, Panglobulin NF, panzyga, Polygam S/D, Privigen, Sandoglobulin, Venoglobulin-S, Vigam, Vivaglobulin, Xembify What should I tell my health care provider before I take this medicine? They need to know if you have any of these conditions:   diabetes   extremely low or no immune antibodies in the blood   heart disease   history of blood clots   hyperprolinemia   infection in the blood, sepsis   kidney disease   taking medicine that may change kidney function - ask your health care provider about your medicine   an unusual or allergic reaction to human immune globulin, albumin, maltose, sucrose, polysorbate 80, other medicines, foods, dyes, or preservatives   pregnant or trying to get pregnant   breast-feeding How should I use this medicine? This medicine is for injection into a muscle or infusion into a vein or skin. It is usually given by a health care professional in a hospital or clinic setting. In rare cases, some brands of this medicine might be given at home. You will be taught how to give this medicine. Use exactly as directed. Take your medicine at regular intervals. Do not take your medicine more often than directed. Talk to your pediatrician regarding  the use of this medicine in children. Special care may be needed. Overdosage: If you think you have taken too much of this medicine contact a poison control center or emergency room at once. NOTE: This medicine is only for you. Do not share this medicine with others. What if I miss a dose? It is important not to miss your dose. Call your doctor or health care professional if you are unable to keep an appointment. If you give yourself the medicine and you miss a dose, take it as soon as you can. If it is almost time for your next dose, take only that dose. Do not take double or extra doses. What may interact with this medicine?  aspirin and aspirin-like medicines  cisplatin  cyclosporine  medicines for infection like acyclovir, adefovir, amphotericin B, bacitracin, cidofovir, foscarnet, ganciclovir, gentamicin, pentamidine, vancomycin  NSAIDS, medicines for pain and inflammation, like ibuprofen or naproxen  pamidronate  vaccines  zoledronic acid This list may not describe all possible interactions. Give your health care provider a list of all the medicines, herbs, non-prescription drugs, or dietary supplements you use. Also tell them if you smoke, drink alcohol, or use illegal drugs. Some items may interact with your medicine. What should I watch for while using this medicine? Your condition will be monitored carefully while you are receiving this medicine. This medicine is made from pooled blood donations of many different people. It may be possible to pass an infection in this medicine. However, the donors are screened for infections and all products are tested for HIV and hepatitis. The medicine is treated to kill most or all bacteria and viruses. Talk to your doctor about   the risks and benefits of this medicine. Do not have vaccinations for at least 14 days before, or until at least 3 months after receiving this medicine. What side effects may I notice from receiving this  medicine? Side effects that you should report to your doctor or health care professional as soon as possible:  allergic reactions like skin rash, itching or hives, swelling of the face, lips, or tongue  breathing problems  chest pain or tightness  fever, chills  headache with nausea, vomiting  neck pain or difficulty moving neck  pain when moving eyes  pain, swelling, warmth in the leg  problems with balance, talking, walking  sudden weight gain  swelling of the ankles, feet, hands  trouble passing urine or change in the amount of urine Side effects that usually do not require medical attention (report to your doctor or health care professional if they continue or are bothersome):  dizzy, drowsy  flushing  increased sweating  leg cramps  muscle aches and pains  pain at site where injected This list may not describe all possible side effects. Call your doctor for medical advice about side effects. You may report side effects to FDA at 1-800-FDA-1088. Where should I keep my medicine? Keep out of the reach of children. This drug is usually given in a hospital or clinic and will not be stored at home. In rare cases, some brands of this medicine may be given at home. If you are using this medicine at home, you will be instructed on how to store this medicine. Throw away any unused medicine after the expiration date on the label. NOTE: This sheet is a summary. It may not cover all possible information. If you have questions about this medicine, talk to your doctor, pharmacist, or health care provider.  2020 Elsevier/Gold Standard (2008-12-30 11:44:49)  

## 2019-09-04 ENCOUNTER — Ambulatory Visit (HOSPITAL_COMMUNITY)
Admission: RE | Admit: 2019-09-04 | Discharge: 2019-09-04 | Disposition: A | Discharge status: Home / Self Care | Payer: Medicare Other | Source: Ambulatory Visit | Attending: Neurology | Admitting: Neurology

## 2019-09-04 ENCOUNTER — Other Ambulatory Visit: Payer: Self-pay

## 2019-09-04 DIAGNOSIS — G6181 Chronic inflammatory demyelinating polyneuritis: Secondary | ICD-10-CM | POA: Insufficient documentation

## 2019-09-04 MED ORDER — IMMUNE GLOBULIN (HUMAN) 10 GM/100ML IV SOLN
1.0000 g/kg | INTRAVENOUS | Status: AC
  Administered 2019-09-04: 85 g via INTRAVENOUS
  Filled 2019-09-04: qty 800

## 2019-09-25 ENCOUNTER — Other Ambulatory Visit: Payer: Self-pay

## 2019-09-25 ENCOUNTER — Ambulatory Visit (HOSPITAL_COMMUNITY)
Admission: RE | Admit: 2019-09-25 | Discharge: 2019-09-25 | Disposition: A | Discharge status: Home / Self Care | Payer: Medicare Other | Source: Ambulatory Visit | Attending: Neurology | Admitting: Neurology

## 2019-09-25 DIAGNOSIS — G6181 Chronic inflammatory demyelinating polyneuritis: Secondary | ICD-10-CM | POA: Insufficient documentation

## 2019-09-25 MED ORDER — IMMUNE GLOBULIN (HUMAN) 10 GM/100ML IV SOLN
1.0000 g/kg | INTRAVENOUS | Status: AC
  Administered 2019-09-25: 85 g via INTRAVENOUS
  Filled 2019-09-25: qty 800

## 2019-10-16 ENCOUNTER — Ambulatory Visit (HOSPITAL_COMMUNITY)
Admission: RE | Admit: 2019-10-16 | Discharge: 2019-10-16 | Disposition: A | Discharge status: Home / Self Care | Payer: Medicare Other | Source: Ambulatory Visit | Attending: Neurology | Admitting: Neurology

## 2019-10-16 ENCOUNTER — Other Ambulatory Visit: Payer: Self-pay

## 2019-10-16 DIAGNOSIS — G6181 Chronic inflammatory demyelinating polyneuritis: Secondary | ICD-10-CM | POA: Insufficient documentation

## 2019-10-16 MED ORDER — IMMUNE GLOBULIN (HUMAN) 10 GM/100ML IV SOLN
1.0000 g/kg | INTRAVENOUS | Status: DC
  Administered 2019-10-16: 85 g via INTRAVENOUS
  Filled 2019-10-16: qty 800

## 2019-11-06 ENCOUNTER — Ambulatory Visit (HOSPITAL_COMMUNITY)
Admission: RE | Admit: 2019-11-06 | Discharge: 2019-11-06 | Disposition: A | Discharge status: Home / Self Care | Payer: Medicare Other | Source: Ambulatory Visit | Attending: Neurology | Admitting: Neurology

## 2019-11-06 ENCOUNTER — Other Ambulatory Visit: Payer: Self-pay

## 2019-11-06 DIAGNOSIS — G6181 Chronic inflammatory demyelinating polyneuritis: Secondary | ICD-10-CM | POA: Insufficient documentation

## 2019-11-06 MED ORDER — IMMUNE GLOBULIN (HUMAN) 10 GM/100ML IV SOLN
1.0000 g/kg | INTRAVENOUS | Status: DC
  Administered 2019-11-06: 85 g via INTRAVENOUS
  Filled 2019-11-06: qty 800

## 2019-11-24 ENCOUNTER — Ambulatory Visit: Payer: Medicare Other | Admitting: Neurology

## 2019-11-26 ENCOUNTER — Ambulatory Visit (INDEPENDENT_AMBULATORY_CARE_PROVIDER_SITE_OTHER): Payer: Medicare Other | Admitting: Neurology

## 2019-11-26 ENCOUNTER — Encounter: Payer: Self-pay | Admitting: Neurology

## 2019-11-26 ENCOUNTER — Other Ambulatory Visit: Payer: Self-pay

## 2019-11-26 VITALS — BP 132/70 | HR 76 | Ht 70.0 in | Wt 185.8 lb

## 2019-11-26 DIAGNOSIS — E782 Mixed hyperlipidemia: Secondary | ICD-10-CM | POA: Diagnosis not present

## 2019-11-26 DIAGNOSIS — J439 Emphysema, unspecified: Secondary | ICD-10-CM | POA: Diagnosis not present

## 2019-11-26 DIAGNOSIS — I10 Essential (primary) hypertension: Secondary | ICD-10-CM | POA: Diagnosis not present

## 2019-11-26 DIAGNOSIS — J449 Chronic obstructive pulmonary disease, unspecified: Secondary | ICD-10-CM | POA: Diagnosis not present

## 2019-11-26 DIAGNOSIS — I480 Paroxysmal atrial fibrillation: Secondary | ICD-10-CM | POA: Diagnosis not present

## 2019-11-26 DIAGNOSIS — E7800 Pure hypercholesterolemia, unspecified: Secondary | ICD-10-CM | POA: Diagnosis not present

## 2019-11-26 DIAGNOSIS — G6181 Chronic inflammatory demyelinating polyneuritis: Secondary | ICD-10-CM | POA: Diagnosis not present

## 2019-11-26 NOTE — Patient Instructions (Addendum)
Nerve testing of the arm in March   Continue IVIG every 3 weeks

## 2019-11-26 NOTE — Progress Notes (Signed)
Follow-up Visit   Date: 11/26/19   Richard Lynch MRN: [id number] DOB: [birth year]   Interim History: Richard Lynch is a 69 y.o. left-handed Caucasian male with hypertension, hyperlipidemia, and tobacco use  returning to the clinic for follow-up of CIDP.  The patient was accompanied to the clinic by self.  History of present illness: In 2016, he began having bilateral shoulder pain and hand tingling which steadily progressed over the years.  In February 2019, he had cervical decompression and fusion at C5-6 to T1-2 in [place] which helped his shoulder pain and hand tingling.   Over the course of the next few months, he developed clawing of the left hand, and difficulty with extending the fingers. Electrodiagnostic testing in October 2019 performed by [name] at [facility] which was interpreted as severe subacute left C8 radiculopathy, left median neuropathy at the wrist, and possible left ulnar at the wrist.   Based on these findings, he had left carpal tunnel release and Guyon canal release as well as anterior interosseous nerve transfer in December 2019.  Unfortunately, he has had not had any improvement and continues to have persistent numbness/tingling and weakness, especially with finger extension and grip on the left hand.   He also reports about a 2-year history of bilateral feet numbness below the level of the ankle.  Symptoms are constant.  He continues to have constant numbness over the fingers on both hands.  His grip is very weak on the left and he is starting to experience similar symptoms in the right hand.  He has a 20-year history of drinking 2-3 alcoholic beverages nightly.  He also has a 42-year history of smoking and is down to half a pack daily.  He previously had history of prediabetes which was corrected with lifestyle modifications.  He reports that his mother had neuropathy and similar symptoms late in her life.  She was also diabetic.  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   He was evaluated in the office here in June 2020 and diagnosed with chronic inflammatory demyelinating polyradiculoneuropathy based on electrodiagnostic and CSF findings (CSF protein 105, normal cell count).  He had loading dose of Solu-Medrol 1 g at the end of July and started on maintenance 1g every 21 days.  He denies any significant improvement in strength or paresthesias, and is frustrated by this.  By October, he felt that his right hand was stronger, but no change to left hand strength and numbness of the hands and feet remains unchanged.  UPDATE 11/26/2019:  He is here for follow-up visit.  In November, he was transitioned from Solumedrol to IVIG due to lack of improvement.  he completed induction dose and started maintenance therapy every 3 weeks. Unfortunately, he denies any change in hand strength or paresthesias.  In fact, he feels that his right hand is getting weaker again and the numbness over the right thumb and index finger is more intense.  His left hand remains weak, especially with finger extension.  He has been more active, hoping that exercise will help.  He also complains of achy right shoulder and bilateral wrist pain, and noticed swelling over the right wrist. He has morning stiffness of all the joints, which improves as the day goes on. He takes Celebrex which is prescribed by his PCP, but seems to get more relief with ibuprofen.  He admits to taking up to 2417m/d of ibuprofen.   Medications:  Current Outpatient Medications  on File Prior to Visit  Medication Sig Dispense Refill  . amphetamine-dextroamphetamine (ADDERALL) 20 MG tablet Take 20 mg by mouth daily. Takes 2.5 tabs qd prn    . Ascorbic Acid (VITAMIN C) 100 MG tablet Take 100 mg by mouth daily.    Marland Kitchen aspirin 81 MG tablet Take 81 mg by mouth daily.    Marland Kitchen atenolol (TENORMIN) 25 MG tablet Take 25 mg by mouth daily.     Marland Kitchen atorvastatin (LIPITOR) 40 MG tablet Take 40 mg by mouth  daily.    . calcium carbonate (OS-CAL) 600 MG TABS tablet Take by mouth.    . candesartan (ATACAND) 32 MG tablet Take 32 mg by mouth daily.     . celecoxib (CELEBREX) 200 MG capsule 1 tab po q day with food for pain and  swelling 30 capsule 0  . chlorthalidone (HYGROTON) 25 MG tablet Take 25 mg by mouth daily.     . Cholecalciferol (VITAMIN D3 PO) Take 2,000 Units by mouth 2 (two) times daily.    . Coenzyme Q10 (COQ-10 PO) Take 300 mg by mouth daily.    . fenofibrate 160 MG tablet Take 160 mg by mouth daily.     . Multiple Vitamins-Minerals (VITRUM 50+ SENIOR MULTI PO) Take by mouth.    . Omega-3 Fatty Acids (FISH OIL PO) Take 2,400 mg by mouth daily.    . valACYclovir (VALTREX) 500 MG tablet Take 500 mg by mouth daily.     . vitamin B-12 (CYANOCOBALAMIN) 1000 MCG tablet Take 1,000 mcg by mouth daily.     Current Facility-Administered Medications on File Prior to Visit  Medication Dose Route Frequency Provider Last Rate Last Admin  . methylPREDNISolone sodium succinate (SOLU-MEDROL) 1,000 mg in sodium chloride 0.9 % 100 mL IVPB  1,000 mg Intravenous Q28 days Delaine Hernandez K, DO        Allergies: No Known Allergies   Vital Signs:  BP 132/70   Pulse 76   Ht 5' 10" (1.778 m)   Wt 185 lb 12.8 oz (84.3 kg)   SpO2 98%   BMI 26.66 kg/m     Neurological Exam: MENTAL STATUS including orientation to time, place, person, recent and remote memory, attention span and concentration, language, and fund of knowledge is normal.  Speech is not dysarthric.  CRANIAL NERVES: Face is symmetric.  No ptosis.  MOTOR: Marked atrophy of the left intrinsic hand muscles. Left claw hand deformity. No  fasciculations or abnormal movements.    Upper Extremity:  Right  Left  Deltoid  5/5   5/5   Biceps  5/5   5/5   Triceps  5/5   5/5   Infraspinatus 5/5  5/5  Medial pectoralis 5/5  5/5  Wrist extensors  5/5   5/5   Wrist flexors  5/5   5/5   Finger extensors  5/5   2+/5   Finger flexors  5/5   4+/5     Dorsal interossei  5/5   3+/5   Abductor pollicis  5/5   1/5   Tone (Ashworth scale)  0  0   Lower Extremity:  Right  Left  Hip flexors  5/5   5/5   Hip extensors  5/5   5/5   Adductor 5/5  5/5  Abductor 5/5  5/5  Knee flexors  5/5   5/5   Knee extensors  5/5   5/5   Dorsiflexors  5/5   5/5   Plantarflexors  5/5  5/5   Toe extensors  5/5   5/5   Toe flexors  5/5   5/5   Tone (Ashworth scale)  0  0    MSRs:                                           Right        Left brachioradialis 2+  2+  biceps 2+  2+  triceps 2+  2+  patellar tr  1+  ankle jerk 0  0   SENSORY:  Vibration is reduced to 50% at the ankles, intact at the knees and MCP bilaterally.  Pin prick and temperature remains reduced over the palms/fingers and toes bilaterally.  COORDINATION/GAIT: Gait narrow based and stable.   Data: DATA: NCS/EMG of the legs 04/17/2019: The electrophysiologic findings are consistent with a sensorimotor polyneuropathy, demyelinating and axonal loss in type affecting the lower extremities.  The presence of sural-sparing suggests findings may be associated with a polyradiculoneuropathy, correlate clinically.  NCS/EMG of the arms 04/01/2019 This is a complex study of the upper extremities.  Findings are as follows:  1. Bilateral median neuropathy at or distal to the wrist (severe), consistent with a clinical diagnosis of carpal tunnel syndrome.   2. Bilateral ulnar neuropathy with slowing across the elbow, mild on the right and severe on the left. 3. Chronic left posterior interosseous neuropathy, very severe.  4. Chronic C7-8 radiculopathy affecting bilateral upper extremities, moderate in degree electrically.  MRI cervical spine wo contrast 07/04/2018: 1. C5-T3 posterior-lateral fusion with decompressive laminectomy from C5-6 to T1-2. There is no arthrodesis by CT and the C5 and C6 lateral mass screws are loose. 2. Disc and facet degeneration causes multilevel foraminal impingement.  Greatest foraminal impingement is seen on the left at C2-3, bilaterally at C3-4, right at C5-6, and right at T1-2. 3. Widely patent canal at levels of laminectomy. Noncompressive spinal stenosis seen at C3-4 and C4-5. 4. Septated fluid collection within the lower laminectomy defect without dural mass effect, best attributed to seroma.  MRI thoracic spine wo contrast 07/05/2018: Cervical and thoracic fusion and laminectomy. 15 mm fluid collection in the laminectomy bed. No cord compression or cord signal abnormality. Left paracentral disc protrusion T7-8 unchanged from the prior MRI.  CSF testing 05/08/2019:   W0 R1 G73 P105*, 2 oligoclonal bands, normal IgG index, MBP and ACE  Labs 04/17/2019: ESR 6, vitamin B1 28, TSH 1.26, copper 106, SPEP with IFE no M protein, vitamin B12 >2000, folate 19.6   IMPRESSION: Chronic inflammatory demyelinating polyradiculoneuropathy affecting arms > legs as seen by demyelinating and axonal loss changes on EMG and supported by spinal analysis which shows a albuminocytologic dissociation (diagnoaed July 2020).  He was started on Solu-Medrol at the end of July 2020 which was continued through October 2020.  He regained reflexes in the arms and full strength in the right hand, but no improvement in the left.  Therefore, it was decided to switch therapy to IVIG in November 2020.  Clinically, there has been no change.  It is still early to appreciate any improvement which can take up to 6 months.  I will keep him on IVIG 31m/kg every 21 days and repeat EMG of the upper extremities next month. If there is improvement, it favors to continue IVIG therapy.  If no improvement or worsening, then consider stopping IVIG.   If  his NCS/EMG suggests that there is focal nerve entrapment, such as worsening carpal tunnel syndrome, I will have him follow-up with Dr. Burney Gauze for consideration of steroid injection to right carpal tunnel to be both diagnostic and therapeutic.  If he  benefits, there may be a role for CTS release on the right.  Based on the severity of axonal loss involving the right radial nerve, he will likely have lasting neurological deficits.    Regarding his polyarthralgias, I have asked him to follow-up with his PCP for management recommendations.   Follow-up after testing  Total time spent:  35 min  Thank you for allowing me to participate in patient's care.  If I can answer any additional questions, I would be pleased to do so.    Sincerely,    Donika K. Posey Pronto, DO

## 2019-11-27 ENCOUNTER — Ambulatory Visit (HOSPITAL_COMMUNITY)
Admission: RE | Admit: 2019-11-27 | Discharge: 2019-11-27 | Disposition: A | Discharge status: Home / Self Care | Payer: Medicare Other | Source: Ambulatory Visit | Attending: Neurology | Admitting: Neurology

## 2019-11-27 DIAGNOSIS — G6181 Chronic inflammatory demyelinating polyneuritis: Secondary | ICD-10-CM | POA: Diagnosis not present

## 2019-11-27 MED ORDER — IMMUNE GLOBULIN (HUMAN) 10 GM/100ML IV SOLN
1.0000 g/kg | INTRAVENOUS | Status: DC
  Administered 2019-11-27: 85 g via INTRAVENOUS
  Filled 2019-11-27: qty 900
  Filled 2019-11-27: qty 850

## 2019-11-27 MED ORDER — DEXTROSE 5 % IV SOLN: INTRAVENOUS | Status: DC

## 2019-12-08 DIAGNOSIS — M130 Polyarthritis, unspecified: Secondary | ICD-10-CM | POA: Diagnosis not present

## 2019-12-08 DIAGNOSIS — L739 Follicular disorder, unspecified: Secondary | ICD-10-CM | POA: Diagnosis not present

## 2019-12-08 DIAGNOSIS — L989 Disorder of the skin and subcutaneous tissue, unspecified: Secondary | ICD-10-CM | POA: Diagnosis not present

## 2019-12-08 DIAGNOSIS — G5602 Carpal tunnel syndrome, left upper limb: Secondary | ICD-10-CM | POA: Diagnosis not present

## 2019-12-09 DIAGNOSIS — H15112 Episcleritis periodica fugax, left eye: Secondary | ICD-10-CM | POA: Diagnosis not present

## 2019-12-09 DIAGNOSIS — H47012 Ischemic optic neuropathy, left eye: Secondary | ICD-10-CM | POA: Diagnosis not present

## 2019-12-09 DIAGNOSIS — Z961 Presence of intraocular lens: Secondary | ICD-10-CM | POA: Diagnosis not present

## 2019-12-09 DIAGNOSIS — H04123 Dry eye syndrome of bilateral lacrimal glands: Secondary | ICD-10-CM | POA: Diagnosis not present

## 2019-12-09 DIAGNOSIS — H2511 Age-related nuclear cataract, right eye: Secondary | ICD-10-CM | POA: Diagnosis not present

## 2019-12-09 DIAGNOSIS — H43812 Vitreous degeneration, left eye: Secondary | ICD-10-CM | POA: Diagnosis not present

## 2019-12-10 DIAGNOSIS — E559 Vitamin D deficiency, unspecified: Secondary | ICD-10-CM | POA: Diagnosis not present

## 2019-12-10 DIAGNOSIS — L989 Disorder of the skin and subcutaneous tissue, unspecified: Secondary | ICD-10-CM | POA: Diagnosis not present

## 2019-12-10 DIAGNOSIS — G4710 Hypersomnia, unspecified: Secondary | ICD-10-CM | POA: Diagnosis not present

## 2019-12-10 DIAGNOSIS — L739 Follicular disorder, unspecified: Secondary | ICD-10-CM | POA: Diagnosis not present

## 2019-12-10 DIAGNOSIS — M130 Polyarthritis, unspecified: Secondary | ICD-10-CM | POA: Diagnosis not present

## 2019-12-10 DIAGNOSIS — I10 Essential (primary) hypertension: Secondary | ICD-10-CM | POA: Diagnosis not present

## 2019-12-10 DIAGNOSIS — G4733 Obstructive sleep apnea (adult) (pediatric): Secondary | ICD-10-CM | POA: Diagnosis not present

## 2019-12-10 DIAGNOSIS — R7303 Prediabetes: Secondary | ICD-10-CM | POA: Diagnosis not present

## 2019-12-10 DIAGNOSIS — E782 Mixed hyperlipidemia: Secondary | ICD-10-CM | POA: Diagnosis not present

## 2019-12-10 DIAGNOSIS — D72829 Elevated white blood cell count, unspecified: Secondary | ICD-10-CM | POA: Diagnosis not present

## 2019-12-10 DIAGNOSIS — M5412 Radiculopathy, cervical region: Secondary | ICD-10-CM | POA: Diagnosis not present

## 2019-12-10 DIAGNOSIS — G6181 Chronic inflammatory demyelinating polyneuritis: Secondary | ICD-10-CM | POA: Diagnosis not present

## 2019-12-12 ENCOUNTER — Telehealth: Payer: Self-pay | Admitting: Neurology

## 2019-12-12 NOTE — Telephone Encounter (Signed)
Patient is aware and stated can he do his IV the day before?

## 2019-12-12 NOTE — Telephone Encounter (Signed)
No, need to keep 7-10 day window before and his vaccination for IVIG.

## 2019-12-12 NOTE — Telephone Encounter (Signed)
Recommend he wait  7-10 days from vaccine to schedule IVIG.

## 2019-12-12 NOTE — Telephone Encounter (Signed)
Patient called stating he is having his first covid-19 vaccine on 12/18/19 and was scheduled to have a IV IG at the same time and cancelled it.   He said they did not have any openings for IV IG sooner than 12/18/19.  Patient told to call Dr. Posey Pronto and get advice on how long to wait for IV IG after getting his covid-19 vaccine so he can call them to reschedule.

## 2019-12-12 NOTE — Telephone Encounter (Signed)
Please advise on below  

## 2019-12-15 NOTE — Telephone Encounter (Signed)
Pt is aware and has no questions or concerns at this time  

## 2019-12-16 DIAGNOSIS — F17210 Nicotine dependence, cigarettes, uncomplicated: Secondary | ICD-10-CM | POA: Diagnosis not present

## 2019-12-16 DIAGNOSIS — I10 Essential (primary) hypertension: Secondary | ICD-10-CM | POA: Diagnosis not present

## 2019-12-16 DIAGNOSIS — G4719 Other hypersomnia: Secondary | ICD-10-CM | POA: Diagnosis not present

## 2019-12-16 DIAGNOSIS — L739 Follicular disorder, unspecified: Secondary | ICD-10-CM | POA: Diagnosis not present

## 2019-12-17 ENCOUNTER — Emergency Department (HOSPITAL_COMMUNITY): Payer: Medicare Other

## 2019-12-17 ENCOUNTER — Emergency Department (HOSPITAL_COMMUNITY)
Admission: EM | Admit: 2019-12-17 | Discharge: 2019-12-17 | Disposition: A | Discharge status: Home / Self Care | Payer: Medicare Other | Attending: Emergency Medicine | Admitting: Emergency Medicine

## 2019-12-17 ENCOUNTER — Telehealth (HOSPITAL_COMMUNITY): Payer: Self-pay | Admitting: Physician Assistant

## 2019-12-17 ENCOUNTER — Encounter (HOSPITAL_COMMUNITY): Payer: Self-pay

## 2019-12-17 ENCOUNTER — Other Ambulatory Visit: Payer: Self-pay

## 2019-12-17 DIAGNOSIS — J811 Chronic pulmonary edema: Secondary | ICD-10-CM | POA: Diagnosis not present

## 2019-12-17 DIAGNOSIS — J449 Chronic obstructive pulmonary disease, unspecified: Secondary | ICD-10-CM | POA: Diagnosis not present

## 2019-12-17 DIAGNOSIS — R0902 Hypoxemia: Secondary | ICD-10-CM | POA: Diagnosis not present

## 2019-12-17 DIAGNOSIS — I10 Essential (primary) hypertension: Secondary | ICD-10-CM | POA: Diagnosis not present

## 2019-12-17 DIAGNOSIS — D539 Nutritional anemia, unspecified: Secondary | ICD-10-CM | POA: Diagnosis not present

## 2019-12-17 DIAGNOSIS — Z7982 Long term (current) use of aspirin: Secondary | ICD-10-CM | POA: Diagnosis not present

## 2019-12-17 DIAGNOSIS — Z96649 Presence of unspecified artificial hip joint: Secondary | ICD-10-CM | POA: Diagnosis not present

## 2019-12-17 DIAGNOSIS — N289 Disorder of kidney and ureter, unspecified: Secondary | ICD-10-CM

## 2019-12-17 DIAGNOSIS — Z96651 Presence of right artificial knee joint: Secondary | ICD-10-CM | POA: Diagnosis not present

## 2019-12-17 DIAGNOSIS — I480 Paroxysmal atrial fibrillation: Secondary | ICD-10-CM | POA: Insufficient documentation

## 2019-12-17 DIAGNOSIS — I499 Cardiac arrhythmia, unspecified: Secondary | ICD-10-CM | POA: Diagnosis not present

## 2019-12-17 DIAGNOSIS — R000 Tachycardia, unspecified: Secondary | ICD-10-CM | POA: Diagnosis present

## 2019-12-17 DIAGNOSIS — I4891 Unspecified atrial fibrillation: Secondary | ICD-10-CM | POA: Diagnosis not present

## 2019-12-17 DIAGNOSIS — Z79899 Other long term (current) drug therapy: Secondary | ICD-10-CM | POA: Insufficient documentation

## 2019-12-17 DIAGNOSIS — R002 Palpitations: Secondary | ICD-10-CM | POA: Diagnosis not present

## 2019-12-17 DIAGNOSIS — F17210 Nicotine dependence, cigarettes, uncomplicated: Secondary | ICD-10-CM | POA: Insufficient documentation

## 2019-12-17 DIAGNOSIS — Z743 Need for continuous supervision: Secondary | ICD-10-CM | POA: Diagnosis not present

## 2019-12-17 LAB — CBC
HCT: 38.2 % — ABNORMAL LOW (ref 39.0–52.0)
Hemoglobin: 12.3 g/dL — ABNORMAL LOW (ref 13.0–17.0)
MCH: 32.6 pg (ref 26.0–34.0)
MCHC: 32.2 g/dL (ref 30.0–36.0)
MCV: 101.3 fL — ABNORMAL HIGH (ref 80.0–100.0)
Platelets: 295 10*3/uL (ref 150–400)
RBC: 3.77 MIL/uL — ABNORMAL LOW (ref 4.22–5.81)
RDW: 12.3 % (ref 11.5–15.5)
WBC: 11.6 10*3/uL — ABNORMAL HIGH (ref 4.0–10.5)
nRBC: 0 % (ref 0.0–0.2)

## 2019-12-17 LAB — BASIC METABOLIC PANEL
Anion gap: 6 (ref 5–15)
BUN: 35 mg/dL — ABNORMAL HIGH (ref 8–23)
CO2: 22 mmol/L (ref 22–32)
Calcium: 9 mg/dL (ref 8.9–10.3)
Chloride: 110 mmol/L (ref 98–111)
Creatinine, Ser: 2.15 mg/dL — ABNORMAL HIGH (ref 0.61–1.24)
GFR calc Af Amer: 36 mL/min — ABNORMAL LOW (ref >60)
GFR calc non Af Amer: 31 mL/min — ABNORMAL LOW (ref >60)
Glucose, Bld: 114 mg/dL — ABNORMAL HIGH (ref 70–99)
Potassium: 3.8 mmol/L (ref 3.5–5.1)
Sodium: 138 mmol/L (ref 135–145)

## 2019-12-17 LAB — TSH: TSH: 2.061 u[IU]/mL (ref 0.350–4.500)

## 2019-12-17 LAB — MAGNESIUM: Magnesium: 2 mg/dL (ref 1.7–2.4)

## 2019-12-17 MED ORDER — APIXABAN 5 MG PO TABS: 5.0000 mg | ORAL_TABLET | Freq: Two times a day (BID) | ORAL | 0 refills | Status: DC

## 2019-12-17 MED ORDER — APIXABAN 5 MG PO TABS
5.0000 mg | ORAL_TABLET | Freq: Two times a day (BID) | ORAL | Status: DC
  Administered 2019-12-17: 5 mg via ORAL
  Filled 2019-12-17 (×2): qty 1

## 2019-12-17 MED ORDER — PROPOFOL 10 MG/ML IV BOLUS
0.5000 mg/kg | Freq: Once | INTRAVENOUS | Status: DC
  Filled 2019-12-17: qty 20

## 2019-12-17 MED ORDER — SODIUM CHLORIDE 0.9 % IV BOLUS
1000.0000 mL | Freq: Once | INTRAVENOUS | Status: AC
  Administered 2019-12-17: 1000 mL via INTRAVENOUS

## 2019-12-17 MED ORDER — PROPOFOL 10 MG/ML IV BOLUS
INTRAVENOUS | Status: AC | PRN
  Administered 2019-12-17: 3561.68 mg via INTRAVENOUS

## 2019-12-17 NOTE — ED Notes (Signed)
Ambulated patient around nurses station, pt has steady gait, denies any dizziness.

## 2019-12-17 NOTE — Discharge Instructions (Addendum)
Please follow up with the atrial fibrillation clinic. They will direct further evaluation and treatment.

## 2019-12-17 NOTE — Telephone Encounter (Signed)
Called and left message for patient to call back to schedule f/u for dccv done in ED today.

## 2019-12-17 NOTE — Sedation Documentation (Signed)
MD Roxanne Mins administered shock 120V

## 2019-12-17 NOTE — ED Provider Notes (Signed)
[facility] EMERGENCY DEPARTMENT Provider Note   CSN: [id number] Arrival date & time: 12/17/19  0135   History Chief Complaint  Patient presents with  . Atrial Fibrillation    Richard Lynch is a 69 y.o. male.  The history is provided by the patient.  Atrial Fibrillation  He has history of hypertension, hyperlipidemia, COPD and comes in because of rapid heart rate tonight.  He states that he was watching television and he started to feel dizzy and could feel his heart beating up into his neck.  He denies chest pain, heaviness, tightness, pressure.  He denies dyspnea, nausea, diaphoresis.  He denies history of prior heart rhythm problems.  He is a cigarette smoker.  He was brought in by ambulance and was given adenosine which slowed his heart rate for a few seconds, and then he was given 2 doses of diltiazem.  Heart rate came down from 180-220 down to 110-170.  Past Medical History:  Diagnosis Date  . Constipation due to opioid therapy 12/22/2015  . Emphysema lung ([place])   . Gait abnormality 02/20/2017  . GERD (gastroesophageal reflux disease)   . High cholesterol   . Hypertension   . OSA (obstructive sleep apnea)    on CPAP  . Primary localized osteoarthritis of right knee 12/08/2015  . Septic olecranon bursitis 06/2015   MSSA    Patient Active Problem List   Diagnosis Date Noted  . Dupuytren's contracture 07/02/2018  . Spinal stenosis in cervical region 12/17/2017  . Carpal tunnel syndrome of left wrist 11/27/2017  . Radial nerve palsy, left 11/27/2017  . Trigger ring finger of left hand 11/27/2017  . Gait abnormality 02/20/2017  . Restless leg syndrome 07/05/2016  . Constipation due to opioid therapy 12/22/2015  . Primary localized osteoarthritis of right knee 12/08/2015  . Bullous emphysema ([place]) 11/02/2015  . Essential hypertension 11/02/2015  . Arthritis 11/02/2015  . Hyperlipidemia 11/02/2015  . OSA on CPAP 11/02/2015  . GERD (gastroesophageal  reflux disease) 11/02/2015  . Septic olecranon bursitis 06/24/2015  . Blepharochalasis 12/29/2014  . Hidrocystoma of left eyelid 12/29/2014  . Myogenic ptosis of eyelid of both eyes 12/29/2014  . Multiple lung nodules on CT 06/30/2014  . Combined senile cataract 12/24/2012  . 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 Myopia with astigmatism and presbyopia 12/24/2012  . Neurotrophic ulcer ([place]) 12/24/2012    Past Surgical History:  Procedure Laterality Date  . COLONOSCOPY W/ BIOPSIES AND POLYPECTOMY    . ELBOW SURGERY  08/2015   bilateral  . EYE SURGERY     'repair of tear to left eye from pliers'  . HAND SURGERY Left 08/2018   Carpal Tunnel Surgery- [place]  . HERNIA REPAIR  07/2010  . KNEE SURGERY     3 times on both knee's each  . MULTIPLE TOOTH EXTRACTIONS    . SHOULDER ARTHROSCOPY    . TOTAL HIP ARTHROPLASTY  08/2010  . TOTAL KNEE ARTHROPLASTY Right 12/20/2015  . TOTAL KNEE ARTHROPLASTY Right 12/20/2015   Procedure: TOTAL KNEE ARTHROPLASTY;  Surgeon: [name], MD;  Location: [facility];  Service: Orthopedics;  Laterality: Right;       Family History  Problem Relation Age of Onset  . Emphysema Father   . Heart disease Father   . Heart disease Mother   . Colon cancer Mother   . Breast cancer Mother     Social History   Tobacco Use  . Smoking status: Current Some Day Smoker  Packs/day: 1.00    Types: Cigarettes    Start date: 01/24/1973  . Smokeless tobacco: Never Used  . Tobacco comment: Peak rate of 3ppd  Substance Use Topics  . Alcohol use: Yes    Alcohol/week: 0.0 standard drinks    Comment: 1-2 drinks per night  . Drug use: No    Home Medications Prior to Admission medications   Medication Sig Start Date End Date Taking? Authorizing Provider  amphetamine-dextroamphetamine (ADDERALL) 20 MG tablet Take 20 mg by mouth daily. Takes 2.5 tabs qd prn    [provider]  Ascorbic Acid (VITAMIN C) 100 MG tablet Take 100 mg by mouth daily.    [provider]  aspirin 81 MG tablet Take 81 mg by mouth daily.    [provider]  atenolol (TENORMIN) 25 MG tablet Take 25 mg by mouth daily.  10/16/15   [provider]  atorvastatin (LIPITOR) 40 MG tablet Take 40 mg by mouth daily. 03/07/19   [provider]  calcium carbonate (OS-CAL) 600 MG TABS tablet Take by mouth.    [provider]  candesartan (ATACAND) 32 MG tablet Take 32 mg by mouth daily.  10/16/15   [provider]  celecoxib (CELEBREX) 200 MG capsule 1 tab po q day with food for pain and  swelling 12/23/15   Shepperson, Kirstin, PA-C  chlorthalidone (HYGROTON) 25 MG tablet Take 25 mg by mouth daily.  10/30/15   [provider]  Cholecalciferol (VITAMIN D3 PO) Take 2,000 Units by mouth 2 (two) times daily.    [provider]  Coenzyme Q10 (COQ-10 PO) Take 300 mg by mouth daily.    [provider]  fenofibrate 160 MG tablet Take 160 mg by mouth daily.  10/16/15   [provider]  Multiple Vitamins-Minerals (VITRUM 50+ SENIOR MULTI PO) Take by mouth.    [provider]  Omega-3 Fatty Acids (FISH OIL PO) Take 2,400 mg by mouth daily.    [provider]  valACYclovir (VALTREX) 500 MG tablet Take 500 mg by mouth daily.  10/16/15   [provider]  vitamin B-12 (CYANOCOBALAMIN) 1000 MCG tablet Take 1,000 mcg by mouth daily.    [provider]    Allergies    Patient has no known allergies.  Review of Systems   Review of Systems  All other systems reviewed and are negative.   Physical Exam Updated Vital Signs Temp 98 F (36.7 C) (Oral)   Ht 5\' 10"  (1.778 m)   Wt 84.4 kg   BMI 26.69 kg/m   Physical Exam Vitals and nursing note reviewed.   68 year old male, resting comfortably and in no acute distress. Vital signs are significant for rapid heart rate. Oxygen saturation is 96%, which is normal. Head is normocephalic and atraumatic. PERRLA, EOMI. Oropharynx is  clear. Neck is nontender and supple without adenopathy or JVD. Back is nontender and there is no CVA tenderness. Lungs are clear without rales, wheezes, or rhonchi. Chest is nontender. Heart is tachycardic and irregular without murmur. Abdomen is soft, flat, nontender without masses or hepatosplenomegaly and peristalsis is normoactive. Extremities have no cyanosis or edema, full range of motion is present. Skin is warm and dry without rash. Neurologic: Mental status is normal, cranial nerves are intact, there are no motor or sensory deficits.  ED Results / Procedures / Treatments   Labs (all labs ordered are listed, but only abnormal results are displayed) Labs Reviewed  BASIC METABOLIC PANEL -  Abnormal; Notable for the following components:      Result Value   Glucose, Bld 114 (*)    BUN 35 (*)    Creatinine, Ser 2.15 (*)    GFR calc non Af Amer 31 (*)    GFR calc Af Amer 36 (*)    All other components within normal limits  CBC - Abnormal; Notable for the following components:   WBC 11.6 (*)    RBC 3.77 (*)    Hemoglobin 12.3 (*)    HCT 38.2 (*)    MCV 101.3 (*)    All other components within normal limits  MAGNESIUM  TSH    EKG EKG Interpretation  Date/Time:  Wednesday December 17 2019 01:43:51 EST Ventricular Rate:  152 PR Interval:    QRS Duration: 89 QT Interval:  300 QTC Calculation: 477 R Axis:   66 Text Interpretation: Atrial fibrillation with rapid ventricular response Repolarization abnormality, prob rate related When compared with ECG of 12/09/2015, Atrial fibrillation with rapid ventricular response has replaced Sinus rhythm REPOLARIZATION ABNORMALITY is now present - probably rate-related Confirmed by Delora Fuel ([id number]) on 12/17/2019 2:01:56 AM   Radiology DG Chest Port 1 View  Result Date: 12/17/2019 CLINICAL DATA:  Lightheadedness, palpitations, atrial fibrillation with rapid ventricular response EXAM: PORTABLE CHEST 1 VIEW COMPARISON:  01/28/2018  FINDINGS: Single frontal view of the chest demonstrates a stable cardiac silhouette. There is central vascular congestion with mild interstitial edema. No airspace disease, effusion, or pneumothorax. Postsurgical changes at the cervicothoracic junction. IMPRESSION: 1. Mild interstitial edema. Electronically Signed   By: Randa Ngo M.D.   On: 12/17/2019 02:31    Procedures .Cardioversion  Date/Time: 12/17/2019 3:45 AM Performed by: Delora Fuel, MD Authorized by: Delora Fuel, MD   Consent:    Consent obtained:  Written   Consent given by:  Patient   Risks discussed:  Cutaneous burn, death, induced arrhythmia and pain   Alternatives discussed:  Rate-control medication Pre-procedure details:    Cardioversion basis:  Emergent   Rhythm:  Atrial fibrillation   Electrode placement:  Anterior-posterior Patient sedated: Yes. Refer to sedation procedure documentation for details of sedation.  Attempt one:    Cardioversion mode:  Synchronous   Waveform:  Biphasic   Shock (Joules):  120   Shock outcome:  Conversion to normal sinus rhythm Post-procedure details:    Patient status:  Alert   Patient tolerance of procedure:  Tolerated well, no immediate complications .Sedation  Date/Time: 12/17/2019 3:54 AM Performed by: Delora Fuel, MD Authorized by: Delora Fuel, MD   Consent:    Consent obtained:  Written   Consent given by:  Patient   Risks discussed:  Allergic reaction, dysrhythmia, inadequate sedation, nausea, prolonged hypoxia resulting in organ damage, prolonged sedation necessitating reversal, respiratory compromise necessitating ventilatory assistance and intubation and vomiting   Alternatives discussed:  Analgesia without sedation, anxiolysis and regional anesthesia Universal protocol:    Procedure explained and questions answered to patient or proxy's satisfaction: yes     Relevant documents present and verified: yes     Test results available and properly labeled: yes      Imaging studies available: yes     Required blood products, implants, devices, and special equipment available: yes     Site/side marked: yes     Immediately prior to procedure a time out was called: yes     Patient identity confirmation method:  Verbally with patient Indications:    Procedure performed:  Cardioversion   Procedure  necessitating sedation performed by:  Physician performing sedation Pre-sedation assessment:    Time since last food or drink:  6 hors   ASA classification: class 2 - patient with mild systemic disease     Neck mobility: normal     Mouth opening:  3 or more finger widths   Thyromental distance:  4 finger widths   Mallampati score:  I - soft palate, uvula, fauces, pillars visible   Pre-sedation assessments completed and reviewed: airway patency, cardiovascular function, hydration status, mental status, nausea/vomiting, pain level, respiratory function and temperature     Pre-sedation assessment completed:  12/17/2019 3:40 AM Immediate pre-procedure details:    Reassessment: Patient reassessed immediately prior to procedure     Reviewed: vital signs, relevant labs/tests and NPO status     Verified: bag valve mask available, emergency equipment available, intubation equipment available, IV patency confirmed, oxygen available and suction available   Procedure details (see MAR for exact dosages):    Preoxygenation:  Nasal cannula   Sedation:  Propofol   Intended level of sedation: deep   Intra-procedure monitoring:  Blood pressure monitoring, cardiac monitor, continuous pulse oximetry, frequent LOC assessments, frequent vital sign checks and continuous capnometry   Intra-procedure events: none     Total Provider sedation time (minutes):  30 Post-procedure details:    Post-sedation assessment completed:  12/17/2019 4:10 AM   Attendance: Constant attendance by certified staff until patient recovered     Recovery: Patient returned to pre-procedure baseline      Post-sedation assessments completed and reviewed: airway patency, cardiovascular function, hydration status, mental status, nausea/vomiting, pain level, respiratory function and temperature     Patient is stable for discharge or admission: yes     Patient tolerance:  Tolerated well, no immediate complications  CRITICAL CARE Performed by: Delora Fuel Total critical care time: 65 minutes Critical care time was exclusive of separately billable procedures and treating other patients. Critical care was necessary to treat or prevent imminent or life-threatening deterioration. Critical care was time spent personally by me on the following activities: development of treatment plan with patient and/or surrogate as well as nursing, discussions with consultants, evaluation of patient's response to treatment, examination of patient, obtaining history from patient or surrogate, ordering and performing treatments and interventions, ordering and review of laboratory studies, ordering and review of radiographic studies, pulse oximetry and re-evaluation of patient's condition.  Medications Ordered in ED Medications  apixaban (ELIQUIS) tablet 5 mg (has no administration in time range)  propofol (DIPRIVAN) 10 mg/mL bolus/IV push 42.2 mg (42.2 mg Intravenous See Procedure Record 12/17/19 [id number])    ED Course  I have reviewed the triage vital signs and the nursing notes.  Pertinent labs & imaging results that were available during my care of the patient were reviewed by me and considered in my medical decision making (see chart for details).  MDM Rules/Calculators/A&P New onset atrial fibrillation with rapid ventricular response.  Will treat with DC cardioversion.  Old records reviewed, and he has no relevant past visits.  Patient became hypotensive prior to doing cardioversion.  He was successfully cardioverted to sinus rhythm.  He has been given IV fluids.  Labs show renal insufficiency new since March 2017.   There is also mild macrocytic anemia which is improved compared with 2017.  Following cardioversion, he has been in sinus rhythm, but blood pressure is still staying below 100.  He will be given IV fluids.  Blood pressure has come up over 100, but not over  110.  Patient was ambulated and he was able to walk without dizziness, so he was felt to be safe for discharge.  He is discharged with prescription for apixaban, referred to atrial fibrillation clinic for follow-up.  CHA2DS2/VAS Stroke Risk Points      2 >= 2 Points: High Risk  1 - 1.99 Points: Medium Risk  0 Points: Low Risk  His score is 2 (one-point for age, one-point for hypertension history).: Last  Change: N/A     This score determines the patient's risk of having a stroke if the patient has atrial fibrillation.   Final Clinical Impression(s) / ED Diagnoses Final diagnoses:  Paroxysmal atrial fibrillation with rapid ventricular response (HCC)  Renal insufficiency  Macrocytic anemia    Rx / DC Orders ED Discharge Orders         Ordered    Amb referral to AFIB Clinic     12/17/19 0212    apixaban (ELIQUIS) 5 MG TABS tablet  2 times daily     12/17/19 [id number]           Delora Fuel, MD [id number] [phone]

## 2019-12-17 NOTE — ED Triage Notes (Signed)
Pt BIB [facility] from home.  Pt was at home in recliner when pt felt sudden onset of lightheadedness with chest and head palitations.  On EMS arrival, pt was in A. FIB RVR 180s-220s.  6mg  of adenosine was given and HR dropped to 80 for a few seconds.   2 10 mg doses of Cardizem was then given which dropped HR to 110s-170s.   EMS VS: 105/59 BP 95% RA

## 2019-12-18 ENCOUNTER — Inpatient Hospital Stay (HOSPITAL_COMMUNITY): Admission: RE | Admit: 2019-12-18 | Payer: Medicare Other | Source: Ambulatory Visit

## 2019-12-18 NOTE — Telephone Encounter (Signed)
Patient returned my call and is aware of appt with Adline Peals, PA on 12/24/19.

## 2019-12-24 ENCOUNTER — Other Ambulatory Visit: Payer: Self-pay

## 2019-12-24 ENCOUNTER — Ambulatory Visit (HOSPITAL_COMMUNITY)
Admission: RE | Admit: 2019-12-24 | Discharge: 2019-12-24 | Disposition: A | Discharge status: Home / Self Care | Payer: Medicare Other | Source: Ambulatory Visit | Attending: Physician Assistant | Admitting: Physician Assistant

## 2019-12-24 ENCOUNTER — Encounter (HOSPITAL_COMMUNITY): Payer: Self-pay | Admitting: Physician Assistant

## 2019-12-24 VITALS — BP 126/74 | HR 65 | Ht 70.0 in | Wt 182.6 lb

## 2019-12-24 DIAGNOSIS — Z8249 Family history of ischemic heart disease and other diseases of the circulatory system: Secondary | ICD-10-CM | POA: Insufficient documentation

## 2019-12-24 DIAGNOSIS — Z7982 Long term (current) use of aspirin: Secondary | ICD-10-CM | POA: Insufficient documentation

## 2019-12-24 DIAGNOSIS — Z96651 Presence of right artificial knee joint: Secondary | ICD-10-CM | POA: Insufficient documentation

## 2019-12-24 DIAGNOSIS — Z825 Family history of asthma and other chronic lower respiratory diseases: Secondary | ICD-10-CM | POA: Diagnosis not present

## 2019-12-24 DIAGNOSIS — E7800 Pure hypercholesterolemia, unspecified: Secondary | ICD-10-CM | POA: Diagnosis not present

## 2019-12-24 DIAGNOSIS — F17210 Nicotine dependence, cigarettes, uncomplicated: Secondary | ICD-10-CM | POA: Diagnosis not present

## 2019-12-24 DIAGNOSIS — J439 Emphysema, unspecified: Secondary | ICD-10-CM | POA: Insufficient documentation

## 2019-12-24 DIAGNOSIS — Z7901 Long term (current) use of anticoagulants: Secondary | ICD-10-CM | POA: Insufficient documentation

## 2019-12-24 DIAGNOSIS — Z96649 Presence of unspecified artificial hip joint: Secondary | ICD-10-CM | POA: Diagnosis not present

## 2019-12-24 DIAGNOSIS — I10 Essential (primary) hypertension: Secondary | ICD-10-CM | POA: Diagnosis not present

## 2019-12-24 DIAGNOSIS — G4733 Obstructive sleep apnea (adult) (pediatric): Secondary | ICD-10-CM | POA: Diagnosis not present

## 2019-12-24 DIAGNOSIS — Z803 Family history of malignant neoplasm of breast: Secondary | ICD-10-CM | POA: Insufficient documentation

## 2019-12-24 DIAGNOSIS — Z800 Family history of malignant neoplasm of digestive organs: Secondary | ICD-10-CM | POA: Insufficient documentation

## 2019-12-24 DIAGNOSIS — D6869 Other thrombophilia: Secondary | ICD-10-CM | POA: Diagnosis not present

## 2019-12-24 DIAGNOSIS — I4891 Unspecified atrial fibrillation: Secondary | ICD-10-CM | POA: Diagnosis present

## 2019-12-24 DIAGNOSIS — E785 Hyperlipidemia, unspecified: Secondary | ICD-10-CM | POA: Diagnosis not present

## 2019-12-24 DIAGNOSIS — Z79899 Other long term (current) drug therapy: Secondary | ICD-10-CM | POA: Diagnosis not present

## 2019-12-24 DIAGNOSIS — I480 Paroxysmal atrial fibrillation: Secondary | ICD-10-CM | POA: Diagnosis not present

## 2019-12-24 MED ORDER — DILTIAZEM HCL 30 MG PO TABS: ORAL_TABLET | ORAL | 1 refills | Status: DC

## 2019-12-24 NOTE — Patient Instructions (Signed)
Cardizem 30mg  -- take 1 tablet every 4 hours AS NEEDED for AFIB heart rate >100 as long as top number of blood pressure >100.   Stop aspirin

## 2019-12-24 NOTE — Progress Notes (Signed)
Primary Care Physician: Vernie Shanks, MD Primary Cardiologist: none Primary Electrophysiologist: none Referring Physician: [facility]   Richard Lynch is a 69 y.o. male with a history of HTN, HLD, tobacco abuse, OSA, COPD, and new onset atrial fibrillation who presents for consultation in the [facility]. The patient was initially diagnosed with atrial fibrillation 12/17/19 after presenting to the ED with symptoms of dizziness and heart racing. He underwent successful DCCV in the ED. Patient is on Eliquis for a CHADS2VASC score of 2. There were no triggering factors that he could identify. He is compliant with his CPAP therapy. He has not had any further episodes of heart racing.   Today, he denies symptoms of palpitations, chest pain, shortness of breath, orthopnea, PND, lower extremity edema, dizziness, presyncope, syncope, snoring, daytime somnolence, bleeding, or neurologic sequela. The patient is tolerating medications without difficulties and is otherwise without complaint today.    Atrial Fibrillation Risk Factors:  he does have symptoms or diagnosis of sleep apnea. he is compliant with CPAP therapy. he does not have a history of rheumatic fever. he does have a history of alcohol use. The patient does have a history of early familial atrial fibrillation or other arrhythmias. Father had afib.  he has a BMI of Body mass index is 26.2 kg/m.Marland Kitchen Filed Weights   12/24/19 0926  Weight: 82.8 kg    Family History  Problem Relation Age of Onset  . Emphysema Father   . Heart disease Father   . Heart disease Mother   . Colon cancer Mother   . Breast cancer Mother      Atrial Fibrillation Management history:  Previous antiarrhythmic drugs: none Previous cardioversions: 12/17/19 Previous ablations: none CHADS2VASC score: 2 Anticoagulation history: Eliquis   Past Medical History:  Diagnosis Date  . Constipation due to opioid therapy 12/22/2015    . Emphysema lung ([place])   . Gait abnormality 02/20/2017  . GERD (gastroesophageal reflux disease)   . High cholesterol   . Hypertension   . OSA (obstructive sleep apnea)    on CPAP  . Primary localized osteoarthritis of right knee 12/08/2015  . Septic olecranon bursitis 06/2015   MSSA   Past Surgical History:  Procedure Laterality Date  . COLONOSCOPY W/ BIOPSIES AND POLYPECTOMY    . ELBOW SURGERY  08/2015   bilateral  . EYE SURGERY     'repair of tear to left eye from pliers'  . HAND SURGERY Left 08/2018   Carpal Tunnel Surgery- [facility]  . HERNIA REPAIR  07/2010  . KNEE SURGERY     3 times on both knee's each  . MULTIPLE TOOTH EXTRACTIONS    . SHOULDER ARTHROSCOPY    . TOTAL HIP ARTHROPLASTY  08/2010  . TOTAL KNEE ARTHROPLASTY Right 12/20/2015  . TOTAL KNEE ARTHROPLASTY Right 12/20/2015   Procedure: TOTAL KNEE ARTHROPLASTY;  Surgeon: [name], MD;  Location: [facility];  Service: Orthopedics;  Laterality: Right;    Current Outpatient Medications  Medication Sig Dispense Refill  . amphetamine-dextroamphetamine (ADDERALL) 20 MG tablet Take 20-30 mg by mouth See admin instructions. Take 1 tablet every morning then take 1 and 1/2 tablets in the afternoon if needed    . apixaban (ELIQUIS) 5 MG TABS tablet Take 1 tablet (5 mg total) by mouth 2 (two) times daily. 60 tablet 0  . Ascorbic Acid (VITAMIN C) 100 MG tablet Take 100 mg by mouth daily.    Marland Kitchen aspirin 81 MG tablet Take  81 mg by mouth daily.    Marland Kitchen atenolol (TENORMIN) 25 MG tablet Take 25 mg by mouth daily.     Marland Kitchen atorvastatin (LIPITOR) 40 MG tablet Take 40 mg by mouth daily.    . calcium carbonate (OS-CAL) 600 MG TABS tablet Take 600 mg by mouth daily with breakfast.     . candesartan (ATACAND) 32 MG tablet Take 32 mg by mouth daily.     . celecoxib (CELEBREX) 200 MG capsule 1 tab po q day with food for pain and  swelling (Patient taking differently: Take 200 mg by mouth daily. ) 30 capsule 0  . chlorthalidone (HYGROTON) 25 MG  tablet Take 12.5 mg by mouth daily.     . Cholecalciferol (VITAMIN D3 PO) Take 2,000 Units by mouth 2 (two) times daily.    . Coenzyme Q10 (COQ-10 PO) Take 300 mg by mouth daily.    . fenofibrate 160 MG tablet Take 160 mg by mouth daily.     . Multiple Vitamin (MULTIVITAMIN WITH MINERALS) TABS tablet Take 1 tablet by mouth daily.    . Omega-3 Fatty Acids (FISH OIL PO) Take 2,400 mg by mouth daily.    Marland Kitchen sulfamethoxazole-trimethoprim (BACTRIM DS) 800-160 MG tablet Take 1 tablet by mouth 2 (two) times daily.    . vitamin B-12 (CYANOCOBALAMIN) 1000 MCG tablet Take 1,000 mcg by mouth daily.     Current Facility-Administered Medications  Medication Dose Route Frequency Provider Last Rate Last Admin  . methylPREDNISolone sodium succinate (SOLU-MEDROL) 1,000 mg in sodium chloride 0.9 % 100 mL IVPB  1,000 mg Intravenous Q28 days Posey Pronto, Donika K, DO        No Known Allergies  Social History   Socioeconomic History  . Marital status: Divorced    Spouse name: Not on file  . Number of children: 3  . Years of education: College  . Highest education level: Not on file  Occupational History  . Occupation: Social worker  Tobacco Use  . Smoking status: Current Some Day Smoker    Packs/day: 1.50    Types: Cigarettes    Start date: 01/24/1973  . Smokeless tobacco: Never Used  . Tobacco comment: 1.5   Substance and Sexual Activity  . Alcohol use: Yes    Alcohol/week: 2.0 standard drinks    Types: 2 Standard drinks or equivalent per week    Comment: 1-2 drinks per night  . Drug use: No  . Sexual activity: Not on file  Other Topics Concern  . Not on file  Social History Narrative   Originally from [place]. He has lived in [place] as well. He moved to [facility] in [place].    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    He has bachelors degree in Business.    Has a dog currently. No bird or mold exposure.    Caffeine [id number] daily    Soda- sometimes   Left-handed   Social Determinants of Health   Financial Resource Strain:   . Difficulty of Paying Living Expenses: Not on file  Food Insecurity:   . Worried About Charity fundraiser in the Last Year: Not on file  . Ran Out of Food in the Last Year: Not on file  Transportation Needs:   . Lack of Transportation (Medical): Not on file  . Lack of Transportation (Non-Medical): Not on file  Physical Activity:   . Days of Exercise per Week:  Not on file  . Minutes of Exercise per Session: Not on file  Stress:   . Feeling of Stress : Not on file  Social Connections:   . Frequency of Communication with Friends and Family: Not on file  . Frequency of Social Gatherings with Friends and Family: Not on file  . Attends Religious Services: Not on file  . Active Member of Clubs or Organizations: Not on file  . Attends Archivist Meetings: Not on file  . Marital Status: Not on file  Intimate Partner Violence:   . Fear of Current or Ex-Partner: Not on file  . Emotionally Abused: Not on file  . Physically Abused: Not on file  . Sexually Abused: Not on file     ROS- All systems are reviewed and negative except as per the HPI above.  Physical Exam: Vitals:   12/24/19 0926  BP: 126/74  Pulse: 65  Weight: 82.8 kg  Height: 5\' 10"  (1.778 m)    GEN- The patient is well appearing, alert and oriented x 3 today.   Head- normocephalic, atraumatic Eyes-  Sclera clear, conjunctiva pink Ears- hearing intact Oropharynx- clear Neck- supple  Lungs- Clear to ausculation bilaterally, normal work of breathing Heart- Regular rate and rhythm, no murmurs, rubs or gallops  GI- soft, NT, ND, + BS Extremities- no clubbing, cyanosis, or edema MS- no significant deformity or atrophy Skin- no rash or lesion Psych- euthymic mood, full affect Neuro- strength and sensation are intact  Wt Readings from Last 3 Encounters:  12/24/19 82.8 kg  12/17/19 84.4 kg  11/27/19 84.4 kg     EKG today demonstrates SR HR 65, PR 150, QRS 86, QTc 409  Epic records are reviewed at length today  CHA2DS2-VASc Score = 2 The patient's score is based upon: CHF History: No HTN History: Yes Age : 84-74 Diabetes History: No Stroke History: No Vascular Disease History: No Gender: Male      ASSESSMENT AND PLAN: 1. Paroxysmal Atrial Fibrillation (ICD10:  I48.0) The patient's CHA2DS2-VASc score is 2, indicating a 2.2% annual risk of stroke.   General education about afib provided and questions answered. We also discussed his stroke risk and the risks and benefits of anticoagulation. Continue atenolol 25 mg daily. Start diltiazem 30 mg PRN q 4hrs for heart racing. Continue Eliquis 5 mg BID. Stop ASA. Check echocardiogram   2. Secondary Hypercoagulable State (ICD10:  D68.69) The patient is at significant risk for stroke/thromboembolism based upon his CHA2DS2-VASc Score of 2.  Continue Apixaban (Eliquis).   3. Obstructive sleep apnea The importance of adequate treatment of sleep apnea was discussed today in order to improve our ability to maintain sinus rhythm long term. Patient reports compliance with CPAP therapy.  4. HTN Stable, no changes today.   Follow up in the AF clinic in one month.   [facility] [address] [place],  [address] [phone] 12/24/2019 9:33 AM

## 2019-12-25 DIAGNOSIS — R899 Unspecified abnormal finding in specimens from other organs, systems and tissues: Secondary | ICD-10-CM | POA: Diagnosis not present

## 2019-12-25 DIAGNOSIS — I480 Paroxysmal atrial fibrillation: Secondary | ICD-10-CM | POA: Diagnosis not present

## 2019-12-25 DIAGNOSIS — F17210 Nicotine dependence, cigarettes, uncomplicated: Secondary | ICD-10-CM | POA: Diagnosis not present

## 2019-12-25 DIAGNOSIS — L989 Disorder of the skin and subcutaneous tissue, unspecified: Secondary | ICD-10-CM | POA: Diagnosis not present

## 2019-12-25 DIAGNOSIS — G4719 Other hypersomnia: Secondary | ICD-10-CM | POA: Diagnosis not present

## 2019-12-25 DIAGNOSIS — E559 Vitamin D deficiency, unspecified: Secondary | ICD-10-CM | POA: Diagnosis not present

## 2019-12-25 DIAGNOSIS — L739 Follicular disorder, unspecified: Secondary | ICD-10-CM | POA: Diagnosis not present

## 2019-12-30 ENCOUNTER — Other Ambulatory Visit: Payer: Self-pay

## 2019-12-30 ENCOUNTER — Ambulatory Visit (INDEPENDENT_AMBULATORY_CARE_PROVIDER_SITE_OTHER): Payer: Medicare Other | Admitting: Neurology

## 2019-12-30 DIAGNOSIS — G5602 Carpal tunnel syndrome, left upper limb: Secondary | ICD-10-CM

## 2019-12-30 DIAGNOSIS — G5623 Lesion of ulnar nerve, bilateral upper limbs: Secondary | ICD-10-CM

## 2019-12-30 DIAGNOSIS — G5632 Lesion of radial nerve, left upper limb: Secondary | ICD-10-CM

## 2019-12-30 DIAGNOSIS — G6181 Chronic inflammatory demyelinating polyneuritis: Secondary | ICD-10-CM

## 2019-12-31 ENCOUNTER — Ambulatory Visit (HOSPITAL_COMMUNITY)
Admission: RE | Admit: 2019-12-31 | Discharge: 2019-12-31 | Disposition: A | Discharge status: Home / Self Care | Payer: Medicare Other | Source: Ambulatory Visit | Attending: Physician Assistant | Admitting: Physician Assistant

## 2019-12-31 DIAGNOSIS — I4891 Unspecified atrial fibrillation: Secondary | ICD-10-CM | POA: Insufficient documentation

## 2019-12-31 DIAGNOSIS — I480 Paroxysmal atrial fibrillation: Secondary | ICD-10-CM | POA: Diagnosis not present

## 2019-12-31 DIAGNOSIS — E785 Hyperlipidemia, unspecified: Secondary | ICD-10-CM | POA: Insufficient documentation

## 2019-12-31 DIAGNOSIS — I10 Essential (primary) hypertension: Secondary | ICD-10-CM | POA: Insufficient documentation

## 2019-12-31 MED ORDER — GABAPENTIN 300 MG PO CAPS: 300.0000 mg | ORAL_CAPSULE | Freq: Every day | ORAL | 5 refills | Status: DC

## 2019-12-31 NOTE — Progress Notes (Signed)
  Echocardiogram 2D Echocardiogram has been performed.  Richard Lynch A Kempton Milne 12/31/2019, 11:40 AM

## 2020-01-01 ENCOUNTER — Telehealth (INDEPENDENT_AMBULATORY_CARE_PROVIDER_SITE_OTHER): Payer: Medicare Other | Admitting: Neurology

## 2020-01-01 ENCOUNTER — Encounter (HOSPITAL_COMMUNITY): Payer: Self-pay | Admitting: *Deleted

## 2020-01-01 DIAGNOSIS — G5602 Carpal tunnel syndrome, left upper limb: Secondary | ICD-10-CM

## 2020-01-01 DIAGNOSIS — G6181 Chronic inflammatory demyelinating polyneuritis: Secondary | ICD-10-CM

## 2020-01-01 DIAGNOSIS — G5623 Lesion of ulnar nerve, bilateral upper limbs: Secondary | ICD-10-CM | POA: Diagnosis not present

## 2020-01-01 DIAGNOSIS — G5632 Lesion of radial nerve, left upper limb: Secondary | ICD-10-CM | POA: Diagnosis not present

## 2020-01-01 NOTE — Procedures (Signed)
[facility]  [address]  [place], [place] [address] Tel: [phone] Fax:  [phone] Test Date:  12/30/2019  Patient: Richard Lynch  DOB: [birth year] Physician: [name], DO  Sex: Male Height: 5\' 11"  Ref Phys: [name], DO  ID#: [id number] Temp: 33.0C Technician:    Patient Complaints: This is a 69 year old man with history of left ulnar decompression at the elbow, carpal tunnel release on the left, and interosseous nerve transfer and work-up suggesting CIDP referred for evaluation of ongoing hand numbness and weakness.  NCV & EMG Findings: Extensive electrodiagnostic testing of the left upper extremity and additional studies of the right shows:  1. Right median sensory response is absent.  The remaining sensory responses including left median, bilateral ulnar, and bilateral radial sensory responses are within normal limits.  Left ulnar sensory response is asymmetrically reduced (5.5 V) as compared to the right (11.4 V) and shows mild improvement when compared to prior study on 04/01/2019. 2. Bilateral median motor responses show interval worsening such that it is absent on the left and markedly prolonged (8.4 ms) and shows reduced amplitude (1.5 mV) on the right.  Left radial motor response continues to show reduced amplitude (1.4 mV).  Left ulnar motor response shows prolonged latency (3.3 ms), reduced amplitude (3.5 mV), and decreased conduction velocity (A Elbow-B Elbow, 43 m/s).  Right ulnar motor response shows normal latency, normal amplitude, and decreased conduction velocity (A Elbow-B Elbow, 45 m/s) across the elbow, which is stable. 3. Chronic motor axon loss changes are seen affecting the left first dorsal interosseous, extensor indicis proprius, abductor digiti minimi, and extensor digitorum communis muscles, without accompanied active denervation.  Despite maximal activation, no motor unit recruitment is seen in the left abductor pollicis brevis  muscle.  In the right upper extremity, chronic motor axonal loss changes are isolated to the abductor pollicis brevis muscle.  Compared to study on 04/01/2019, neurogenic changes are not observed in proximal muscles.  Impression: This is a complex study.  Electrodiagnostic findings showed improved sensory responses as compared to study on 04/01/2019, except right median sensory remains absent.  Otherwise, there has not been a significant improvement in any motor responses.  Findings show: 1. Right carpal tunnel syndrome, very severe with mild progression. 2. Left posterior interosseous neuropathy, very severe, stable. 3. Bilateral ulnar neuropathy with slowing across the elbow, severe on the left and mild on the right, stable. 4. Chronic T1 radiculopathy affecting the left upper extremity, severe.   ___________________________ [name], DO    Nerve Conduction Studies Anti Sensory Summary Table   Stim Site NR Peak (ms) Norm Peak (ms) P-T Amp (V) Norm P-T Amp  Left Median Anti Sensory (2nd Digit)  33C  Wrist    3.8 <3.8 12.2 >10  Right Median Anti Sensory (2nd Digit)  33C  Wrist NR  <3.8  >10  Left Radial Anti Sensory (Base 1st Digit)  33C  Wrist    2.8 <2.8 11.6 >10  Right Radial Anti Sensory (Base 1st Digit)  33C  Wrist    2.1 <2.8 14.7 >10  Left Ulnar Anti Sensory (5th Digit)  33C  Wrist    3.0 <3.2 5.5 >5  Right Ulnar Anti Sensory (5th Digit)  33C  Wrist    2.9 <3.2 11.4 >5   Motor Summary Table   Stim Site NR Onset (ms) Norm Onset (ms) O-P Amp (mV) Norm O-P Amp Site1 Site2 Delta-0 (ms) Dist (cm) Vel (m/s) Norm Vel (m/s)  Left Median Motor (Abd Poll Brev)  33C  Wrist NR  <4.0  >5 Elbow Wrist  0.0  >50  Elbow NR            Right Median Motor (Abd Poll Brev)  33C  Wrist    8.4 <4.0 1.5 >5 Elbow Wrist 6.6 33.0 50 >50  Elbow    15.0  1.6  Ulnar-wrist crossover Elbow 10.2 0.0    Ulnar-wrist crossover    4.8  2.5         Left Radial Motor (Ext Ind Prop)  33C  7cm     2.0 <3.1 1.4 >5 Up Arm 7cm  0.0  >50  Up Arm NR     Axilla Up Arm  0.0    Axilla NR            Site 4 NR            Left Ulnar Motor (Abd Dig Minimi)  33C  Wrist    3.3 <3.1 3.5 >7 B Elbow Wrist 5.1 26.0 51 >50  B Elbow    8.4  2.9  A Elbow B Elbow 2.3 10.0 43 >50  A Elbow    10.7  2.8         Right Ulnar Motor (Abd Dig Minimi)  33C  Wrist    2.2 <3.1 7.3 >7 B Elbow Wrist 4.4 26.0 59 >50  B Elbow    6.6  6.5  A Elbow B Elbow 2.2 10.0 45 >50  A Elbow    8.8  6.3          EMG   Side Muscle Ins Act Fibs Psw Fasc Number Recrt Dur Dur. Amp Amp. Poly Poly. Comment  Right 1stDorInt Nml Nml Nml Nml Nml Nml Nml Nml Nml Nml Nml Nml N/A  Right Abd Poll Brev Nml Nml Nml Nml 3- Rapid All 1+ All 1+ All 1+ ATR  Right PronatorTeres Nml Nml Nml Nml Nml Nml Nml Nml Nml Nml Nml Nml N/A  Right Biceps Nml Nml Nml Nml Nml Nml Nml Nml Nml Nml Nml Nml N/A  Right Triceps Nml Nml Nml Nml Nml Nml Nml Nml Nml Nml Nml Nml N/A  Right Deltoid Nml Nml Nml Nml Nml Nml Nml Nml Nml Nml Nml Nml N/A  Left 1stDorInt Nml Nml Nml Nml SMU Rapid All 1+ All 1+ All 1+ ATR  Left Ext Indicis Nml Nml Nml Nml 3- Rapid All 2+ All 2+ All 1+ ATR  Left ABD Dig Min Nml Nml Nml Nml SMU Rapid All 1+ All 1+ All 1+ ATR  Left PronatorTeres Nml Nml Nml Nml Nml Nml Nml Nml Nml Nml Nml Nml N/A  Left Abd Poll Brev Nml Nml Nml Nml NE Rapid - - - - - - ATR  Left Biceps Nml Nml Nml Nml Nml Nml Nml Nml Nml Nml Nml Nml N/A  Left Triceps Nml Nml Nml Nml Nml Nml Nml Nml Nml Nml Nml Nml N/A  Left Deltoid Nml Nml Nml Nml Nml Nml Nml Nml Nml Nml Nml Nml N/A  Left ExtCarRadLong Nml Nml Nml Nml Nml Nml Nml Nml Nml Nml Nml Nml N/A  Left Ext Digitorum Nml Nml Nml Nml SMU Rapid All 1+ All 1+ All 1+ N/A      Waveforms:

## 2020-01-01 NOTE — Telephone Encounter (Signed)
EMG results discussed with patient via telephone.  Findings show improved sensory responses, except R median which remains absent, but relatively no change motor responses as compared to prior study in June 2020. Findings show: 1. Right carpal tunnel syndrome, very severe with mild progression. 2. Left posterior interosseous neuropathy, very severe, stable. 3. Bilateral ulnar neuropathy with slowing across the elbow, severe on the left and mild on the right, stable. 4. Chronic T1 radiculopathy affecting the left upper extremity, severe.  He was treated for CIDP with ~ 5 months Solumedrol infusions and transitioned to IVIG in November due to inadequate response.  Clinically, he has not noticed significant improvement on IVIG or steroids.  His exam, however, shows return of reflexes but no change in hand weakness.  With his sensory responses improved, it suggests that treatment for IVIG was somewhat helpful, but only with respect to sensory nerves.   Because he continues to have severe R carpal tunnel syndrome, left posterior interosseus neuropathy, and bilateral (L >>R) ulnar neuropathy, I think it is reasonable to seek Dr. Bertis Ruddy opinion on whether R CTS release or redo left ulnar decompression would be helpful. Left radial and ulnar nerve deficits are very severe and will most likely be permanent deficits.   Finally, there is now severe motor deficits involving left abductor pollicis brevis, with normal sensory response and normal pronator teres, suggestive of T1 radiculopathy.  He has had cervical fusion from C5-T3 by Dr. Marykay Lex.  He is scheduled to follow-up with Dr. Marykay Lex in the next few weeks and I would like him to consider myelogram to evaluate for nerve impingement at the left T1 level. He will remain on IVIG 1g/kg every 21 days x 4 months for now For painful parethesias, I will start him on gabapentin 300mg  at bedtime and titrate as needed.    Raylynn Hersh K. Posey Pronto, DO

## 2020-01-05 ENCOUNTER — Other Ambulatory Visit: Payer: Self-pay

## 2020-01-05 ENCOUNTER — Ambulatory Visit (HOSPITAL_COMMUNITY)
Admission: RE | Admit: 2020-01-05 | Discharge: 2020-01-05 | Disposition: A | Discharge status: Home / Self Care | Payer: Medicare Other | Source: Ambulatory Visit | Attending: Neurology | Admitting: Neurology

## 2020-01-05 DIAGNOSIS — G6181 Chronic inflammatory demyelinating polyneuritis: Secondary | ICD-10-CM | POA: Diagnosis not present

## 2020-01-05 MED ORDER — IMMUNE GLOBULIN (HUMAN) 10 GM/100ML IV SOLN
1.0000 g/kg | INTRAVENOUS | Status: DC
  Administered 2020-01-05: 85 g via INTRAVENOUS
  Filled 2020-01-05: qty 800

## 2020-01-06 ENCOUNTER — Telehealth: Payer: Self-pay | Admitting: Neurology

## 2020-01-06 NOTE — Telephone Encounter (Signed)
Richard Lynch from [facility], Dr. Bertis Ruddy office called needing to get his EMG test from 2020 fasxed over to them. She said they have the one from this year but would like 2020. He will be following up with that office tomorrow 01/07/20. Thank you

## 2020-01-06 NOTE — Telephone Encounter (Signed)
emg report has been faxed to Dr Bertis Ruddy office.

## 2020-01-07 DIAGNOSIS — L738 Other specified follicular disorders: Secondary | ICD-10-CM | POA: Diagnosis not present

## 2020-01-07 DIAGNOSIS — L821 Other seborrheic keratosis: Secondary | ICD-10-CM | POA: Diagnosis not present

## 2020-01-07 DIAGNOSIS — D692 Other nonthrombocytopenic purpura: Secondary | ICD-10-CM | POA: Diagnosis not present

## 2020-01-07 DIAGNOSIS — L723 Sebaceous cyst: Secondary | ICD-10-CM | POA: Diagnosis not present

## 2020-01-07 DIAGNOSIS — L570 Actinic keratosis: Secondary | ICD-10-CM | POA: Diagnosis not present

## 2020-01-07 DIAGNOSIS — D485 Neoplasm of uncertain behavior of skin: Secondary | ICD-10-CM | POA: Diagnosis not present

## 2020-01-08 ENCOUNTER — Other Ambulatory Visit (HOSPITAL_COMMUNITY): Payer: Self-pay | Admitting: *Deleted

## 2020-01-08 DIAGNOSIS — M1811 Unilateral primary osteoarthritis of first carpometacarpal joint, right hand: Secondary | ICD-10-CM | POA: Diagnosis not present

## 2020-01-08 DIAGNOSIS — I480 Paroxysmal atrial fibrillation: Secondary | ICD-10-CM

## 2020-01-08 DIAGNOSIS — G5622 Lesion of ulnar nerve, left upper limb: Secondary | ICD-10-CM | POA: Diagnosis not present

## 2020-01-08 DIAGNOSIS — M79641 Pain in right hand: Secondary | ICD-10-CM | POA: Diagnosis not present

## 2020-01-08 DIAGNOSIS — G5602 Carpal tunnel syndrome, left upper limb: Secondary | ICD-10-CM | POA: Diagnosis not present

## 2020-01-08 DIAGNOSIS — M19031 Primary osteoarthritis, right wrist: Secondary | ICD-10-CM | POA: Diagnosis not present

## 2020-01-08 DIAGNOSIS — G5632 Lesion of radial nerve, left upper limb: Secondary | ICD-10-CM | POA: Diagnosis not present

## 2020-01-09 ENCOUNTER — Ambulatory Visit (INDEPENDENT_AMBULATORY_CARE_PROVIDER_SITE_OTHER): Payer: Medicare Other | Admitting: Neurology

## 2020-01-09 ENCOUNTER — Telehealth: Payer: Self-pay

## 2020-01-09 DIAGNOSIS — G5602 Carpal tunnel syndrome, left upper limb: Secondary | ICD-10-CM

## 2020-01-09 DIAGNOSIS — G6181 Chronic inflammatory demyelinating polyneuritis: Secondary | ICD-10-CM

## 2020-01-09 NOTE — Telephone Encounter (Signed)
Patient wants the doctor to know that carpal tunnel surgery on 3/24. Says the doctor was planning to also correct a nerve, Patient would like to speak with the  doctor via phone or mychart.

## 2020-01-09 NOTE — Telephone Encounter (Signed)
Patient added for 3:30pm and checked in for phone visit. Thanks

## 2020-01-09 NOTE — Progress Notes (Signed)
   Due to the COVID-19 crisis, this virtual check-in visit was done via telephone from my office and it was initiated and consent given by this patient and or family.   Telephone (Audio) Visit The purpose of this telephone visit is to provide medical care while limiting exposure to the novel coronavirus.    Consent was obtained for telephone visit and initiated by pt/family:  Yes.   Answered questions that patient had about telehealth interaction:  Yes.   I discussed the limitations, risks, security and privacy concerns of performing an evaluation and management service by telephone. I also discussed with the patient that there may be a patient responsible charge related to this service. The patient expressed understanding and agreed to proceed.  Pt location: Home Physician Location: office Name of referring provider:  Vernie Shanks, MD I connected with .Richard Lynch at patients initiation/request on 01/09/2020 at  3:30 PM EDT by telephone and verified that I am speaking with the correct person using two identifiers.  Pt MRN:  [id number] Pt DOB:  September 18, 1951   History of Present Illness: This is a a 69 year-old man requesting call to review upcoming plans for right CTS release and left ulnar decompression. Patient informed that I have personally spoke with Dr. Burney Gauze to discuss his care.  He was evaluated yesterday by Dr. Bertis Ruddy and recommended R CTS release.  Patient wanted to seek any therapeutic options for the left hand and understands that given the severity of weakness, overall benefit may be low, but would like to pursue surgical decompression of the left ulnar and posterior interosseus nerve.  I appreciate Dr. Bertis Ruddy assistance in his care.  He will be seeing his spine specialist in May, Dr. Leonor Liv and I will request they reassess his cervical spine with MRI to look for left T1 radiculopathy, as suggested by EMG.  He will be continued on IVIG for the next 4-6 months for CIDP.    Follow-up in August 2021.   Total Time spent in visit with the patient was:  6 min, of which 100% of the time was spent in counseling and/or coordinating care.   Pt understands and agrees with the plan of care outlined.     Alda Berthold, DO

## 2020-01-09 NOTE — Telephone Encounter (Signed)
Please add pt as VV at 3:30p today.

## 2020-01-09 NOTE — Telephone Encounter (Signed)
Please advise 

## 2020-01-12 ENCOUNTER — Other Ambulatory Visit: Payer: Self-pay

## 2020-01-12 ENCOUNTER — Encounter: Payer: Self-pay | Admitting: Cardiology

## 2020-01-12 ENCOUNTER — Telehealth: Payer: Self-pay

## 2020-01-12 ENCOUNTER — Ambulatory Visit: Payer: Medicare Other | Admitting: Cardiology

## 2020-01-12 VITALS — BP 126/69 | HR 71 | Temp 98.7°F | Resp 16 | Ht 70.0 in | Wt 187.0 lb

## 2020-01-12 DIAGNOSIS — I480 Paroxysmal atrial fibrillation: Secondary | ICD-10-CM

## 2020-01-12 DIAGNOSIS — I10 Essential (primary) hypertension: Secondary | ICD-10-CM

## 2020-01-12 DIAGNOSIS — Z01810 Encounter for preprocedural cardiovascular examination: Secondary | ICD-10-CM | POA: Diagnosis not present

## 2020-01-12 DIAGNOSIS — I2510 Atherosclerotic heart disease of native coronary artery without angina pectoris: Secondary | ICD-10-CM

## 2020-01-12 DIAGNOSIS — E7800 Pure hypercholesterolemia, unspecified: Secondary | ICD-10-CM

## 2020-01-12 DIAGNOSIS — N1832 Chronic kidney disease, stage 3b: Secondary | ICD-10-CM | POA: Diagnosis not present

## 2020-01-12 DIAGNOSIS — R1900 Intra-abdominal and pelvic swelling, mass and lump, unspecified site: Secondary | ICD-10-CM | POA: Diagnosis not present

## 2020-01-12 MED ORDER — APIXABAN 5 MG PO TABS: 5.0000 mg | ORAL_TABLET | Freq: Two times a day (BID) | ORAL | 3 refills | Status: DC

## 2020-01-12 NOTE — Progress Notes (Signed)
Primary Physician/Referring:  Vernie Shanks, MD  Patient ID: Richard Lynch, male    DOB: Jan 11, 1951, 69 y.o.   MRN: [id number]  Chief Complaint  Patient presents with  . Paroxysmal Atrial Fibrillation  . Medical Clearance   HPI:    Richard Lynch  is a 69 y.o. hypertension, hyperlipidemia, obstructive sleep apnea on CPAP follows Dr. Maxwell Caul, medically managed mild PAD here for follow-up and for preoperative cardiac risk stratification prior to hand surgery. Presented with A. Fib with RVR on 12/17/19 after presenting to the ED with symptoms of dizziness and heart racing. He underwent successful DCCV in the ED.  He has had a normal stress tests and 07/2017. Carotid ultrasound showed minimal stenosis in bilateral internal carotid artery. Echocardiogram on 01/01/2020 showing essentially normal echocardiogram.  Previously noted moderate concentric LVH, normal EF, grade 1 diastolic dysfunction was not mentioned in the recent echocardiogram performed by [facility].  He was seen by A. fib clinic, now referred back to me for further management and preoperative cardiac stratification.  Since cardioversion, has felt well and has not had any further palpitations or fatigue.  Unfortunately still smoking.  Past Medical History:  Diagnosis Date  . Constipation due to opioid therapy 12/22/2015  . Emphysema lung ([place])   . Gait abnormality 02/20/2017  . GERD (gastroesophageal reflux disease)   . High cholesterol   . Hypertension   . OSA (obstructive sleep apnea)    on CPAP  . Primary localized osteoarthritis of right knee 12/08/2015  . Septic olecranon bursitis 06/2015   MSSA   Past Surgical History:  Procedure Laterality Date  . COLONOSCOPY W/ BIOPSIES AND POLYPECTOMY    . ELBOW SURGERY  08/2015   bilateral  . EYE SURGERY     'repair of tear to left eye from pliers'  . HAND SURGERY Left 08/2018   Carpal Tunnel Surgery- [place]  . HERNIA REPAIR  07/2010  . KNEE SURGERY     3 times on both knee's each   . MULTIPLE TOOTH EXTRACTIONS    . SHOULDER ARTHROSCOPY    . TOTAL HIP ARTHROPLASTY  08/2010  . TOTAL KNEE ARTHROPLASTY Right 12/20/2015  . TOTAL KNEE ARTHROPLASTY Right 12/20/2015   Procedure: TOTAL KNEE ARTHROPLASTY;  Surgeon: [name], MD;  Location: [place];  Service: Orthopedics;  Laterality: Right;   Family History  Problem Relation Age of Onset  . Emphysema Father   . Heart disease Father   . Heart disease Mother   . Colon cancer Mother   . Breast cancer Mother     Social History   Tobacco Use  . Smoking status: Current Some Day Smoker    Packs/day: 1.50    Types: Cigarettes    Start date: 01/24/1973  . Smokeless tobacco: Never Used  . Tobacco comment: 1.5   Substance Use Topics  . Alcohol use: Yes    Alcohol/week: 2.0 standard drinks    Types: 2 Standard drinks or equivalent per week    Comment: 1-2 drinks per night   ROS  Review of Systems  Cardiovascular: Negative for chest pain, dyspnea on exertion and leg swelling.  Gastrointestinal: Negative for melena.  Neurological: Positive for numbness (hands) and paresthesias (hands).   Objective  Blood pressure 126/69, pulse 71, temperature 98.7 F (37.1 C), temperature source Temporal, resp. rate 16, height 5\' 10"  (1.778 m), weight 187 lb (84.8 kg), SpO2 95 %.  Vitals with BMI 01/12/2020 01/05/2020 01/05/2020  Height 5\' 10"  - -  Weight  187 lbs - -  BMI [id number] - -  Systolic [id number] 96  Diastolic 69 72 61  Pulse 71 70 70     Physical Exam  Cardiovascular: Normal rate, regular rhythm and normal heart sounds. Exam reveals no gallop.  No murmur heard. Pulses:      Carotid pulses are 2+ on the right side and 2+ on the left side.      Dorsalis pedis pulses are 2+ on the right side and 0 on the left side.       Posterior tibial pulses are 1+ on the right side and 1+ on the left side.  No leg edema, no JVD.  Pulmonary/Chest: Effort normal and breath sounds normal.  Abdominal: Soft. Bowel sounds are normal.   Laboratory  examination:   Recent Labs    12/17/19 0211  NA 138  K 3.8  CL 110  CO2 22  GLUCOSE 114*  BUN 35*  CREATININE 2.15*  CALCIUM 9.0  GFRNONAA 31*  GFRAA 36*   CrCl cannot be calculated (Patient's most recent lab result is older than the maximum 21 days allowed.).  CMP Latest Ref Rng & Units 12/17/2019 04/17/2019 03/06/2017  Glucose 70 - 99 mg/dL 114(H) - -  BUN 8 - 23 mg/dL 35(H) - -  Creatinine 0.61 - 1.24 mg/dL 2.15(H) - -  Sodium 135 - 145 mmol/L 138 - -  Potassium 3.5 - 5.1 mmol/L 3.8 - -  Chloride 98 - 111 mmol/L 110 - -  CO2 22 - 32 mmol/L 22 - -  Calcium 8.9 - 10.3 mg/dL 9.0 - -  Total Protein 6.1 - 8.1 g/dL - 6.7 6.9  Total Bilirubin 0.3 - 1.2 mg/dL - - -  Alkaline Phos 38 - 126 U/L - - -  AST 15 - 41 U/L - - -  ALT 17 - 63 U/L - - -   CBC Latest Ref Rng & Units 12/17/2019 12/23/2015 12/22/2015  WBC 4.0 - 10.5 K/uL 11.6(H) 12.1(H) 17.5(H)  Hemoglobin 13.0 - 17.0 g/dL 12.3(L) 9.1(L) 9.2(L)  Hematocrit 39.0 - 52.0 % 38.2(L) 27.3(L) 28.2(L)  Platelets 150 - 400 K/uL 295 306 319   Recent Labs    04/17/19 0916 12/17/19 0212  TSH 1.26 2.061   External labs:   Cholesterol, total 128.000 12/10/2019 HDL 51.000 12/10/2019 LDL 54.000 12/10/2019 Triglycerides 132.000 12/10/2019  Hemoglobin 12.300 12/17/2019; Platelets 295.000 12/17/2019  Creatinine, Serum 2.150 12/17/2019 Potassium 3.800 12/17/2019 ALT (SGPT) 21.000 12/10/2019  TSH 2.061 12/17/2019; A1C 5.500 12/10/2019  Medications and allergies  No Known Allergies   Current Outpatient Medications  Medication Instructions  . amphetamine-dextroamphetamine (ADDERALL) 20 MG tablet 20-30 mg, Oral, See admin instructions, Take 1 tablet every morning then take 1 and 1/2 tablets in the afternoon if needed  . apixaban (ELIQUIS) 5 mg, Oral, 2 times daily  . atenolol (TENORMIN) 25 mg, Oral, Daily  . atorvastatin (LIPITOR) 40 mg, Oral, Daily  . calcium carbonate (OS-CAL) 600 mg, Oral, Daily with breakfast  . candesartan (ATACAND) 32  mg, Oral, Daily  . celecoxib (CELEBREX) 200 MG capsule 1 tab po q day with food for pain and  swelling  . chlorthalidone (HYGROTON) 12.5 mg, Oral, Daily  . Coenzyme Q10 (COQ-10 PO) 300 mg, Oral, Daily  . diltiazem (CARDIZEM) 30 MG tablet Take 1 tablet every 4 hours AS NEEDED for AFIB heart rate >100  . fenofibrate 160 mg, Oral, Daily  . gabapentin (NEURONTIN) 300 mg, Oral, Daily at bedtime  . Multiple Vitamin (MULTIVITAMIN  WITH MINERALS) TABS tablet 1 tablet, Oral, Daily  . Omega-3 Fatty Acids (FISH OIL PO) 2,400 mg, Oral, Daily  . vitamin B-12 (CYANOCOBALAMIN) 1,000 mcg, Oral, Daily  . vitamin C 100 mg, Oral, Daily   Radiology:   Low dose CT Chest 08/11/2019: 1. Lung-RADS 2, benign appearance or behavior. Continue annual screening with low-dose chest CT without contrast in 12 months. 2. Two-vessel coronary atherosclerosis. 3. Aortic Atherosclerosis   Cardiac Studies:   Ultrasound arterial segmental pressure and exercise ABI 06/11/2017: Ankle-brachial indices and waveforms are normal at rest.  Decreased ankle-brachial indices immediately after exercise, right side greater than left. ABIs returned to baseline within 2-3 minutes. Post exercise study is suggestive for mild underlying peripheral vascular disease, particularly in the right lower Extremity.  Right ABI: 0.68, one minute after exercise. 0.97, three minutes after exercise. Left ABI: 0.85, one minute after exercise. 1.09, two minutes after exercise.  Carotid artery duplex 08/23/2017: Minimal stenosis in the bilateral internal carotid artery (minimal).  Minimal stenosis of the left common  carotid artery. Antegrade right vertebral artery flow. Antegrade left vertebral artery flow. F/U if clinically indicated.  Lexiscan sestamibi stress test 08/17/2017: 1. The exercise tolerance was not assessed due to pharmacologic stress. Resting EKG: Normal sinus rhythm, RBBB.  Stress EKG: Nondiagnostic for ischemia as a pharmacologic  stress test. Stress symptoms included dyspnea. 2. Normal SPECT study with normal left ventricular systolic function. Low risk study.  Abdominal aortic duplex 08/28/2017: Diffuse plaque noted in the proximal aorta. Focal plaque noted in the mid and distal aorta.  A small abdominal aortic aneurysm measuring 2.84 x 2.89 x 2.79 cm is seen. Normal iliac artery velocity bilateral.  Recheck in 2 years.  Echocardiogram 12/31/2019:  1. Left ventricular ejection fraction, by estimation, is 60 to 65%. The  left ventricle has normal function. The left ventricle has no regional  wall motion abnormalities. Left ventricular diastolic parameters were normal.  2. Right ventricular systolic function is normal. The right ventricular  size is normal. Tricuspid regurgitation signal is inadequate for assessing  PA pressure.  3. Left atrial size was mildly dilated.  4. No significant change from 08/24/2017.  EKG  EKG 01/12/2020: Normal sinus rhythm at rate of 67 bpm, normal axis.  No evidence of ischemia, normal EKG.    Assessment     ICD-10-CM   1. Preoperative cardiovascular examination: Nerve decompression left hand  Z01.810 EKG 12-Lead  2. Paroxysmal atrial fibrillation ([place]). CHA2DS2-VASc Score is 3.  Yearly risk of stroke: 3.2% (A, HTN, Vasc Dz).      I48.0 apixaban (ELIQUIS) 5 MG TABS tablet  3. Coronary artery calcification seen on CAT scan  I25.10   4. Stage 3b chronic kidney disease  N18.32   5. Hypercholesteremia  E78.00   6. Primary hypertension  I10   7. Pulsatile abdominal mass  R19.00 PCV AORTA DUPLEX     Meds ordered this encounter  Medications  . apixaban (ELIQUIS) 5 MG TABS tablet    Sig: Take 1 tablet (5 mg total) by mouth 2 (two) times daily.    Dispense:  180 tablet    Refill:  3    Medications Discontinued During This Encounter  Medication Reason  . Cholecalciferol (VITAMIN D3 PO) Completed Course  . sulfamethoxazole-trimethoprim (BACTRIM DS) 800-160 MG tablet Completed  Course  . apixaban (ELIQUIS) 5 MG TABS tablet Reorder  . aspirin EC 81 MG tablet Discontinued by provider    Recommendations:   Richard Lynch  is a  69 y.o. hypertension, hyperlipidemia, obstructive sleep apnea on CPAP follows Dr. Maxwell Caul, medically managed mild PAD here for follow-up and for preoperative cardiac risk stratification prior to hand surgery. Presented with A. Fib with RVR on 12/17/19 after presenting to the ED with symptoms of dizziness and heart racing. He underwent successful DCCV in the ED.  I had not seen him for almost 2+ years.  I reviewed his entire medical records, advised him that he should be on long-term anticoagulation.  He is presently maintaining sinus rhythm.  He is also been compliant with his CPAP.  Extensive discussion was also held regarding his smoking cessation in view of coronary atherosclerosis and aortic atherosclerosis and PAD. He is asymptomatic with regards to PAD with abnormal ABI and physical exam.   Blood pressure and lipids well controlled.  He does have stage III chronic kidney disease that is remained stable. He needs abdominal duplex, pulsatile mass easily appreciated.  Previously he has had mild aortic dilatation around 2.8 cm.  Preoperative cardiovascular examination: Left hand CIDP and decompression surgery left elbow.  He can be taken up for the upcoming surgery with low risk.  He can discontinue Eliquis 2 days prior to surgery, restart either the same day or the following day, he is presently maintaining sinus rhythm.  I did not make any changes to his medications.  Clearance letter sent to the surgeon.  Adrian Prows, MD, [facility] 01/12/2020, 9:21 PM [facility]. [place] Office: [phone]

## 2020-01-12 NOTE — Telephone Encounter (Signed)
Sent notes per Dr.Patel to Dr Rowe Pavy.

## 2020-01-12 NOTE — Telephone Encounter (Signed)
-----   Message from Alda Berthold, DO sent at 01/09/2020  5:32 PM EDT ----- Regarding: Notes Please send telephone note dated 3/11, office note 3/19, and EMG from 3/9 to Dr. Terrilyn Saver - patient's spine doctor - as Juluis Rainier.  Thanks.

## 2020-01-14 DIAGNOSIS — G5632 Lesion of radial nerve, left upper limb: Secondary | ICD-10-CM | POA: Diagnosis not present

## 2020-01-14 DIAGNOSIS — G5622 Lesion of ulnar nerve, left upper limb: Secondary | ICD-10-CM | POA: Diagnosis not present

## 2020-01-14 DIAGNOSIS — M79631 Pain in right forearm: Secondary | ICD-10-CM | POA: Diagnosis not present

## 2020-01-14 DIAGNOSIS — G5601 Carpal tunnel syndrome, right upper limb: Secondary | ICD-10-CM | POA: Diagnosis not present

## 2020-01-15 ENCOUNTER — Other Ambulatory Visit (HOSPITAL_COMMUNITY): Payer: Self-pay | Admitting: Physician Assistant

## 2020-01-15 DIAGNOSIS — I10 Essential (primary) hypertension: Secondary | ICD-10-CM | POA: Diagnosis not present

## 2020-01-15 DIAGNOSIS — E782 Mixed hyperlipidemia: Secondary | ICD-10-CM | POA: Diagnosis not present

## 2020-01-15 DIAGNOSIS — I480 Paroxysmal atrial fibrillation: Secondary | ICD-10-CM | POA: Diagnosis not present

## 2020-01-15 DIAGNOSIS — J449 Chronic obstructive pulmonary disease, unspecified: Secondary | ICD-10-CM | POA: Diagnosis not present

## 2020-01-15 DIAGNOSIS — J439 Emphysema, unspecified: Secondary | ICD-10-CM | POA: Diagnosis not present

## 2020-01-15 DIAGNOSIS — E7800 Pure hypercholesterolemia, unspecified: Secondary | ICD-10-CM | POA: Diagnosis not present

## 2020-01-26 ENCOUNTER — Other Ambulatory Visit: Payer: Self-pay

## 2020-01-26 ENCOUNTER — Encounter (HOSPITAL_COMMUNITY): Payer: Self-pay | Admitting: Physician Assistant

## 2020-01-26 ENCOUNTER — Ambulatory Visit (HOSPITAL_COMMUNITY)
Admission: RE | Admit: 2020-01-26 | Discharge: 2020-01-26 | Disposition: A | Discharge status: Home / Self Care | Payer: Medicare Other | Source: Ambulatory Visit | Attending: Physician Assistant | Admitting: Physician Assistant

## 2020-01-26 VITALS — BP 114/64 | HR 69 | Ht 70.0 in | Wt 188.8 lb

## 2020-01-26 DIAGNOSIS — D6869 Other thrombophilia: Secondary | ICD-10-CM | POA: Diagnosis not present

## 2020-01-26 DIAGNOSIS — Z79899 Other long term (current) drug therapy: Secondary | ICD-10-CM | POA: Insufficient documentation

## 2020-01-26 DIAGNOSIS — J439 Emphysema, unspecified: Secondary | ICD-10-CM | POA: Insufficient documentation

## 2020-01-26 DIAGNOSIS — E785 Hyperlipidemia, unspecified: Secondary | ICD-10-CM | POA: Insufficient documentation

## 2020-01-26 DIAGNOSIS — I480 Paroxysmal atrial fibrillation: Secondary | ICD-10-CM | POA: Diagnosis not present

## 2020-01-26 DIAGNOSIS — G4733 Obstructive sleep apnea (adult) (pediatric): Secondary | ICD-10-CM | POA: Diagnosis not present

## 2020-01-26 DIAGNOSIS — I10 Essential (primary) hypertension: Secondary | ICD-10-CM | POA: Insufficient documentation

## 2020-01-26 DIAGNOSIS — K219 Gastro-esophageal reflux disease without esophagitis: Secondary | ICD-10-CM | POA: Insufficient documentation

## 2020-01-26 DIAGNOSIS — F17210 Nicotine dependence, cigarettes, uncomplicated: Secondary | ICD-10-CM | POA: Diagnosis not present

## 2020-01-26 DIAGNOSIS — Z7901 Long term (current) use of anticoagulants: Secondary | ICD-10-CM | POA: Insufficient documentation

## 2020-01-26 NOTE — Progress Notes (Signed)
Primary Care Physician: Vernie Shanks, MD Primary Cardiologist: Dr Einar Gip Primary Electrophysiologist: none Referring Physician: [facility]   Brayon C Martinique is a 69 y.o. male with a history of HTN, HLD, tobacco abuse, OSA, COPD, and new onset atrial fibrillation who presents for follow up in the [facility]. The patient was initially diagnosed with atrial fibrillation 12/17/19 after presenting to the ED with symptoms of dizziness and heart racing. He underwent successful DCCV in the ED. Patient is on Eliquis for a CHADS2VASC score of 2. There were no triggering factors that he could identify. He is compliant with his CPAP therapy.   On follow up today, patient reports that he has done well since his last visit with no heart racing or palpitations. He is tolerating the medication without difficulty. He denies any bleeding issues on anticoagulation.    Today, he denies symptoms of palpitations, chest pain, shortness of breath, orthopnea, PND, lower extremity edema, dizziness, presyncope, syncope, snoring, daytime somnolence, bleeding, or neurologic sequela. The patient is tolerating medications without difficulties and is otherwise without complaint today.    Atrial Fibrillation Risk Factors:  he does have symptoms or diagnosis of sleep apnea. he is compliant with CPAP therapy. he does not have a history of rheumatic fever. he does have a history of alcohol use. The patient does have a history of early familial atrial fibrillation or other arrhythmias. Father had afib.  he has a BMI of Body mass index is 27.09 kg/m.Marland Kitchen Filed Weights   01/26/20 0934  Weight: 85.6 kg    Family History  Problem Relation Age of Onset  . Emphysema Father   . Heart disease Father   . Heart disease Mother   . Colon cancer Mother   . Breast cancer Mother      Atrial Fibrillation Management history:  Previous antiarrhythmic drugs: none Previous cardioversions:  12/17/19 Previous ablations: none CHADS2VASC score: 2 Anticoagulation history: Eliquis   Past Medical History:  Diagnosis Date  . Constipation due to opioid therapy 12/22/2015  . Emphysema lung ([place])   . Gait abnormality 02/20/2017  . GERD (gastroesophageal reflux disease)   . High cholesterol   . Hypertension   . OSA (obstructive sleep apnea)    on CPAP  . Primary localized osteoarthritis of right knee 12/08/2015  . Septic olecranon bursitis 06/2015   MSSA   Past Surgical History:  Procedure Laterality Date  . COLONOSCOPY W/ BIOPSIES AND POLYPECTOMY    . ELBOW SURGERY  08/2015   bilateral  . EYE SURGERY     'repair of tear to left eye from pliers'  . HAND SURGERY Left 08/2018   Carpal Tunnel Surgery- [place]  . HERNIA REPAIR  07/2010  . KNEE SURGERY     3 times on both knee's each  . MULTIPLE TOOTH EXTRACTIONS    . SHOULDER ARTHROSCOPY    . TOTAL HIP ARTHROPLASTY  08/2010  . TOTAL KNEE ARTHROPLASTY Right 12/20/2015  . TOTAL KNEE ARTHROPLASTY Right 12/20/2015   Procedure: TOTAL KNEE ARTHROPLASTY;  Surgeon: [name], MD;  Location: [place];  Service: Orthopedics;  Laterality: Right;    Current Outpatient Medications  Medication Sig Dispense Refill  . amphetamine-dextroamphetamine (ADDERALL) 20 MG tablet Take 20-30 mg by mouth See admin instructions. Take 1 tablet every morning then take 1 and 1/2 tablets in the afternoon if needed    . apixaban (ELIQUIS) 5 MG TABS tablet Take 1 tablet (5 mg total) by mouth 2 (two)  times daily. 180 tablet 3  . Ascorbic Acid (VITAMIN C) 100 MG tablet Take 100 mg by mouth daily.    Marland Kitchen atenolol (TENORMIN) 25 MG tablet Take 25 mg by mouth daily.     Marland Kitchen atorvastatin (LIPITOR) 40 MG tablet Take 40 mg by mouth daily.    . calcium carbonate (OS-CAL) 600 MG TABS tablet Take 600 mg by mouth daily with breakfast.     . candesartan (ATACAND) 32 MG tablet Take 32 mg by mouth daily.     . celecoxib (CELEBREX) 200 MG capsule 1 tab po q day with food for  pain and  swelling (Patient taking differently: Take 200 mg by mouth daily. ) 30 capsule 0  . chlorthalidone (HYGROTON) 25 MG tablet Take 12.5 mg by mouth daily.     . Coenzyme Q10 (COQ-10 PO) Take 300 mg by mouth daily.    Marland Kitchen diltiazem (CARDIZEM) 30 MG tablet Take 1 tablet every 4 hours AS NEEDED for AFIB heart rate >100 45 tablet 1  . fenofibrate 160 MG tablet Take 160 mg by mouth daily.     Marland Kitchen gabapentin (NEURONTIN) 300 MG capsule Take 1 capsule (300 mg total) by mouth at bedtime. 30 capsule 5  . Multiple Vitamin (MULTIVITAMIN WITH MINERALS) TABS tablet Take 1 tablet by mouth daily.    . Omega-3 Fatty Acids (FISH OIL PO) Take 2,400 mg by mouth daily.    . vitamin B-12 (CYANOCOBALAMIN) 1000 MCG tablet Take 1,000 mcg by mouth daily.     Current Facility-Administered Medications  Medication Dose Route Frequency Provider Last Rate Last Admin  . methylPREDNISolone sodium succinate (SOLU-MEDROL) 1,000 mg in sodium chloride 0.9 % 100 mL IVPB  1,000 mg Intravenous Q28 days Posey Pronto, Donika K, DO        No Known Allergies  Social History   Socioeconomic History  . Marital status: Divorced    Spouse name: Not on file  . Number of children: 3  . Years of education: College  . Highest education level: Not on file  Occupational History  . Occupation: Social worker  Tobacco Use  . Smoking status: Current Some Day Smoker    Packs/day: 1.00    Types: Cigarettes    Start date: 01/24/1973  . Smokeless tobacco: Never Used  . Tobacco comment: 1 pack daily  Substance and Sexual Activity  . Alcohol use: Yes    Alcohol/week: 4.0 standard drinks    Types: 2 Standard drinks or equivalent, 2 Cans of beer per week    Comment: 1-2 drinks per night  . Drug use: No  . Sexual activity: Not on file  Other Topics Concern  . Not on file  Social History Narrative   Originally from [place]. He has lived in [place] as well. He moved to [facility] in [place].    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    He has bachelors degree in Business.    Has a dog currently. No bird or mold exposure.    Caffeine [id number] daily   Soda- sometimes   Left-handed   Social Determinants of Health   Financial Resource Strain:   . Difficulty of Paying Living Expenses:   Food Insecurity:   . Worried About Charity fundraiser in the Last Year:   . Arboriculturist in the Last Year:   Transportation Needs:   . Film/video editor (Medical):   Marland Kitchen  Lack of Transportation (Non-Medical):   Physical Activity:   . Days of Exercise per Week:   . Minutes of Exercise per Session:   Stress:   . Feeling of Stress :   Social Connections:   . Frequency of Communication with Friends and Family:   . Frequency of Social Gatherings with Friends and Family:   . Attends Religious Services:   . Active Member of Clubs or Organizations:   . Attends Archivist Meetings:   Marland Kitchen Marital Status:   Intimate Partner Violence:   . Fear of Current or Ex-Partner:   . Emotionally Abused:   Marland Kitchen Physically Abused:   . Sexually Abused:      ROS- All systems are reviewed and negative except as per the HPI above.  Physical Exam: Vitals:   01/26/20 0934  BP: 114/64  Pulse: 69  Weight: 85.6 kg  Height: 5\' 10"  (1.778 m)   GEN- The patient is well appearing, alert and oriented x 3 today.   HEENT-head normocephalic, atraumatic, sclera clear, conjunctiva pink, hearing intact, trachea midline. Lungs- Clear to ausculation bilaterally, normal work of breathing Heart- Regular rate and rhythm, no murmurs, rubs or gallops  GI- soft, NT, ND, + BS Extremities- no clubbing, cyanosis, or edema MS- no significant deformity or atrophy Skin- no rash or lesion Psych- euthymic mood, full affect Neuro- strength and sensation are intact   Wt Readings from Last 3 Encounters:  01/26/20 85.6 kg  01/12/20 84.8 kg  01/05/20 84.4 kg    EKG today demonstrates SR HR 69, PR 156, QRS  86, QTc 413  Echo 12/31/19 1. Left ventricular ejection fraction, by estimation, is 60 to 65%. The  left ventricle has normal function. The left ventricle has no regional  wall motion abnormalities. Left ventricular diastolic parameters were  normal.  2. Right ventricular systolic function is normal. The right ventricular  size is normal. Tricuspid regurgitation signal is inadequate for assessing  PA pressure.  3. Left atrial size was mildly dilated.  4. The mitral valve is normal in structure. Trivial mitral valve  regurgitation. No evidence of mitral stenosis.  5. The aortic valve is normal in structure. Aortic valve regurgitation is  not visualized. No aortic stenosis is present.  6. The inferior vena cava is normal in size with greater than 50%  respiratory variability, suggesting right atrial pressure of 3 mmHg.   Epic records are reviewed at length today  CHA2DS2-VASc Score = 2 The patient's score is based upon: CHF History: No HTN History: Yes Age : 59-74 Diabetes History: No Stroke History: No Vascular Disease History: No Gender: Male   ASSESSMENT AND PLAN: 1. Paroxysmal Atrial Fibrillation (ICD10:  I48.0) The patient's CHA2DS2-VASc score is 2, indicating a 2.2% annual risk of stroke.   Patient appears to be maintaining SR. Continue atenolol 25 mg daily. Continue diltiazem 30 mg PRN q 4hrs for heart racing. Continue Eliquis 5 mg BID.  We discussed lifestyle modification including regular physical activity and smoking cessation.   2. Secondary Hypercoagulable State (ICD10:  D68.69) The patient is at significant risk for stroke/thromboembolism based upon his CHA2DS2-VASc Score of 2.  Continue Apixaban (Eliquis).   3. Obstructive sleep apnea The importance of adequate treatment of sleep apnea was discussed today in order to improve our ability to maintain sinus rhythm long term. Patient reports compliance with CPAP therapy.   4. HTN Stable, no changes  today.   Follow up with Dr Einar Gip as scheduled moving forward. AF clinic  only as needed.   [facility] [address] [place], [place] [address] [phone] 01/26/2020 10:02 AM

## 2020-01-28 ENCOUNTER — Ambulatory Visit: Payer: Medicare Other

## 2020-01-28 ENCOUNTER — Other Ambulatory Visit: Payer: Self-pay

## 2020-01-28 DIAGNOSIS — R1900 Intra-abdominal and pelvic swelling, mass and lump, unspecified site: Secondary | ICD-10-CM | POA: Diagnosis not present

## 2020-01-28 DIAGNOSIS — I700 Atherosclerosis of aorta: Secondary | ICD-10-CM

## 2020-01-30 ENCOUNTER — Ambulatory Visit: Payer: Medicare Other | Admitting: Cardiovascular Disease

## 2020-02-02 ENCOUNTER — Ambulatory Visit (HOSPITAL_COMMUNITY)
Admission: RE | Admit: 2020-02-02 | Discharge: 2020-02-02 | Disposition: A | Discharge status: Home / Self Care | Payer: Medicare Other | Source: Ambulatory Visit | Attending: Neurology | Admitting: Neurology

## 2020-02-02 ENCOUNTER — Other Ambulatory Visit: Payer: Self-pay

## 2020-02-02 DIAGNOSIS — G610 Guillain-Barre syndrome: Secondary | ICD-10-CM | POA: Insufficient documentation

## 2020-02-02 MED ORDER — IMMUNE GLOBULIN (HUMAN) 10 GM/100ML IV SOLN
1.0000 g/kg | INTRAVENOUS | Status: DC
  Administered 2020-02-02: 85 g via INTRAVENOUS
  Filled 2020-02-02: qty 900
  Filled 2020-02-02: qty 50

## 2020-02-23 ENCOUNTER — Other Ambulatory Visit: Payer: Self-pay

## 2020-02-23 ENCOUNTER — Ambulatory Visit (HOSPITAL_COMMUNITY)
Admission: RE | Admit: 2020-02-23 | Discharge: 2020-02-23 | Disposition: A | Discharge status: Home / Self Care | Payer: Medicare Other | Source: Ambulatory Visit | Attending: Neurology | Admitting: Neurology

## 2020-02-23 DIAGNOSIS — G6181 Chronic inflammatory demyelinating polyneuritis: Secondary | ICD-10-CM | POA: Insufficient documentation

## 2020-02-23 MED ORDER — IMMUNE GLOBULIN (HUMAN) 10 GM/100ML IV SOLN
1.0000 g/kg | INTRAVENOUS | Status: DC
  Administered 2020-02-23: 85 g via INTRAVENOUS
  Filled 2020-02-23: qty 800

## 2020-02-25 DIAGNOSIS — M4323 Fusion of spine, cervicothoracic region: Secondary | ICD-10-CM | POA: Diagnosis not present

## 2020-02-25 DIAGNOSIS — M542 Cervicalgia: Secondary | ICD-10-CM | POA: Diagnosis not present

## 2020-02-27 DIAGNOSIS — E673 Hypervitaminosis D: Secondary | ICD-10-CM | POA: Diagnosis not present

## 2020-02-27 DIAGNOSIS — J439 Emphysema, unspecified: Secondary | ICD-10-CM | POA: Diagnosis not present

## 2020-02-27 DIAGNOSIS — N3281 Overactive bladder: Secondary | ICD-10-CM | POA: Diagnosis not present

## 2020-02-27 DIAGNOSIS — E7800 Pure hypercholesterolemia, unspecified: Secondary | ICD-10-CM | POA: Diagnosis not present

## 2020-02-27 DIAGNOSIS — I10 Essential (primary) hypertension: Secondary | ICD-10-CM | POA: Diagnosis not present

## 2020-02-27 DIAGNOSIS — I480 Paroxysmal atrial fibrillation: Secondary | ICD-10-CM | POA: Diagnosis not present

## 2020-02-27 DIAGNOSIS — M130 Polyarthritis, unspecified: Secondary | ICD-10-CM | POA: Diagnosis not present

## 2020-02-27 DIAGNOSIS — E782 Mixed hyperlipidemia: Secondary | ICD-10-CM | POA: Diagnosis not present

## 2020-02-27 DIAGNOSIS — J449 Chronic obstructive pulmonary disease, unspecified: Secondary | ICD-10-CM | POA: Diagnosis not present

## 2020-02-27 DIAGNOSIS — R899 Unspecified abnormal finding in specimens from other organs, systems and tissues: Secondary | ICD-10-CM | POA: Diagnosis not present

## 2020-02-27 DIAGNOSIS — G5602 Carpal tunnel syndrome, left upper limb: Secondary | ICD-10-CM | POA: Diagnosis not present

## 2020-02-27 DIAGNOSIS — G6181 Chronic inflammatory demyelinating polyneuritis: Secondary | ICD-10-CM | POA: Diagnosis not present

## 2020-03-15 ENCOUNTER — Ambulatory Visit (HOSPITAL_COMMUNITY)
Admission: RE | Admit: 2020-03-15 | Discharge: 2020-03-15 | Disposition: A | Discharge status: Home / Self Care | Payer: Medicare Other | Source: Ambulatory Visit | Attending: Neurology | Admitting: Neurology

## 2020-03-15 ENCOUNTER — Other Ambulatory Visit: Payer: Self-pay

## 2020-03-15 DIAGNOSIS — G6181 Chronic inflammatory demyelinating polyneuritis: Secondary | ICD-10-CM | POA: Diagnosis not present

## 2020-03-15 MED ORDER — IMMUNE GLOBULIN (HUMAN) 10 GM/100ML IV SOLN
1.0000 g/kg | INTRAVENOUS | Status: DC
  Administered 2020-03-15: 85 g via INTRAVENOUS
  Filled 2020-03-15: qty 50

## 2020-03-18 ENCOUNTER — Ambulatory Visit: Payer: Medicare Other | Admitting: Cardiology

## 2020-03-19 NOTE — Progress Notes (Signed)
Primary Physician/Referring:  Vernie Shanks, MD  Patient ID: Richard Lynch, male    DOB: November 25, 1950, 69 y.o.   MRN: [id number]  Chief Complaint  Patient presents with   Follow-up    2 month   Atrial Fibrillation   AAA   HPI:    Richard Lynch  is a 69 y.o. hypertension, hyperlipidemia, obstructive sleep apnea on CPAP follows Dr. Maxwell Caul, medically managed mild PAD here for follow-up and for preoperative cardiac risk stratification prior to hand surgery. Presented with A. Fib with RVR on 12/17/19 after presenting to the ED with symptoms of dizziness and heart racing and had successful DCCV in the ED. he has not had any recurrence. He is presently on Eliquis long-term.  I saw him 2 months ago but previous to that he had seen me 2 years ago, hence about him back to reevaluate him, I given him clearance for hand surgery which he underwent with no periprocedural complications. No specific symptoms today, presents to discuss abdominal aortic duplex. Still smoking about 1 pack of cigarettes a day.  He has had a normal stress tests and 07/2017. Carotid ultrasound showed minimal stenosis in bilateral internal carotid artery in 2018. Echocardiogram on 01/01/2020 showing essentially normal echocardiogram.     Past Medical History:  Diagnosis Date   Constipation due to opioid therapy 12/22/2015   Emphysema lung ([place])    Gait abnormality 02/20/2017   GERD (gastroesophageal reflux disease)    High cholesterol    Hypertension    OSA (obstructive sleep apnea)    on CPAP   Primary localized osteoarthritis of right knee 12/08/2015   Septic olecranon bursitis 06/2015   MSSA   Past Surgical History:  Procedure Laterality Date   COLONOSCOPY W/ BIOPSIES AND POLYPECTOMY     ELBOW SURGERY  08/2015   bilateral   EYE SURGERY     'repair of tear to left eye from pliers'   HAND SURGERY Left 08/2018   Carpal Tunnel Surgery- [place]  07/2010   KNEE SURGERY     3 times  on both knee's each   MULTIPLE TOOTH EXTRACTIONS     SHOULDER ARTHROSCOPY     TOTAL HIP ARTHROPLASTY  08/2010   TOTAL KNEE ARTHROPLASTY Right 12/20/2015   TOTAL KNEE ARTHROPLASTY Right 12/20/2015   Procedure: TOTAL KNEE ARTHROPLASTY;  Surgeon: [name], MD;  Location: [facility];  Service: Orthopedics;  Laterality: Right;   Family History  Problem Relation Age of Onset   Emphysema Father    Heart disease Father    Heart disease Mother    Colon cancer Mother    Breast cancer Mother     Social History   Tobacco Use   Smoking status: Current Every Day Smoker    Packs/day: 1.00    Types: Cigarettes    Start date: 01/24/1973   Smokeless tobacco: Never Used   Tobacco comment: 1 pack daily  Substance Use Topics   Alcohol use: Yes    Alcohol/week: 4.0 standard drinks    Types: 2 Cans of beer, 2 Standard drinks or equivalent per week    Comment: 1-2 drinks per night   Marital Status: Divorced  ROS  Review of Systems  Cardiovascular: Negative for chest pain, dyspnea on exertion and leg swelling.  Gastrointestinal: Negative for melena.  Neurological: Positive for numbness (hands) and paresthesias (hands).   Objective  Blood pressure 136/69, pulse 79, height 5\' 10"  (1.778 m), weight 192 lb (87.1 kg).  Vitals with BMI 03/23/2020 03/15/2020 03/15/2020  Height 5\' 10"  - -  Weight 192 lbs - -  BMI [id number] - -  Systolic [id number]  Diastolic 69 81 71  Pulse 79 65 62     Physical Exam  Cardiovascular: Normal rate, regular rhythm and normal heart sounds. Exam reveals no gallop.  No murmur heard. Pulses:      Carotid pulses are 2+ on the right side and 2+ on the left side with bruit.      Dorsalis pedis pulses are 2+ on the right side and 0 on the left side.       Posterior tibial pulses are 1+ on the right side and 1+ on the left side.  No leg edema, no JVD.  Pulmonary/Chest: Effort normal and breath sounds normal.  Abdominal: Soft. Bowel sounds are normal.   Laboratory  examination:   Recent Labs    12/17/19 0211  NA 138  K 3.8  CL 110  CO2 22  GLUCOSE 114*  BUN 35*  CREATININE 2.15*  CALCIUM 9.0  GFRNONAA 31*  GFRAA 36*   CrCl cannot be calculated (Patient's most recent lab result is older than the maximum 21 days allowed.).  CMP Latest Ref Rng & Units 12/17/2019 04/17/2019 03/06/2017  Glucose 70 - 99 mg/dL 114(H) - -  BUN 8 - 23 mg/dL 35(H) - -  Creatinine 0.61 - 1.24 mg/dL 2.15(H) - -  Sodium 135 - 145 mmol/L 138 - -  Potassium 3.5 - 5.1 mmol/L 3.8 - -  Chloride 98 - 111 mmol/L 110 - -  CO2 22 - 32 mmol/L 22 - -  Calcium 8.9 - 10.3 mg/dL 9.0 - -  Total Protein 6.1 - 8.1 g/dL - 6.7 6.9  Total Bilirubin 0.3 - 1.2 mg/dL - - -  Alkaline Phos 38 - 126 U/L - - -  AST 15 - 41 U/L - - -  ALT 17 - 63 U/L - - -   CBC Latest Ref Rng & Units 12/17/2019 12/23/2015 12/22/2015  WBC 4.0 - 10.5 K/uL 11.6(H) 12.1(H) 17.5(H)  Hemoglobin 13.0 - 17.0 g/dL 12.3(L) 9.1(L) 9.2(L)  Hematocrit 39.0 - 52.0 % 38.2(L) 27.3(L) 28.2(L)  Platelets 150 - 400 K/uL 295 306 319   Recent Labs    04/17/19 0916 12/17/19 0212  TSH 1.26 2.061    External labs:  Glucose Random 81.000 02/27/2020 BUN 25.000 02/27/2020 Creatinine, Serum 1.160 02/27/2020  Cholesterol, total 128.000 12/10/2019 Triglycerides 132.000 12/10/2019 HDL 51.000 12/10/2019 LDL 54.000 12/10/2019  Hemoglobin 12.300 12/17/2019; Platelets 295.000 12/17/2019  Creatinine, Serum 2.150 12/17/2019 Potassium 3.800 12/17/2019 ALT (SGPT) 21.000 12/10/2019  TSH 2.061 12/17/2019; A1C 5.500 12/10/2019  Medications and allergies  No Known Allergies   Current Outpatient Medications  Medication Instructions   amphetamine-dextroamphetamine (ADDERALL) 20 MG tablet 20-30 mg, Oral, See admin instructions, Take 1 tablet every morning then take 1 and 1/2 tablets in the afternoon if needed   apixaban (ELIQUIS) 5 mg, Oral, 2 times daily   atenolol (TENORMIN) 25 mg, Oral, Daily   atorvastatin (LIPITOR) 40 mg, Oral, Daily    calcium carbonate (OS-CAL) 600 mg, Oral, Daily with breakfast   candesartan (ATACAND) 32 mg, Oral, Daily   celecoxib (CELEBREX) 200 MG capsule 1 tab po q day with food for pain and  swelling   chlorthalidone (HYGROTON) 25 mg, Oral, Daily, Take 1/2 tablet daily.   Coenzyme Q10 (COQ-10 PO) 300 mg, Oral, Daily   diltiazem (CARDIZEM) 30 MG tablet Take 1 tablet  every 4 hours AS NEEDED for AFIB heart rate >100   fenofibrate 160 mg, Oral, Daily   gabapentin (NEURONTIN) 300 mg, Oral, Daily at bedtime   Multiple Vitamin (MULTIVITAMIN WITH MINERALS) TABS tablet 1 tablet, Oral, Daily   Omega-3 Fatty Acids (FISH OIL PO) 2,400 mg, Oral, Daily   Turmeric (QC TUMERIC COMPLEX PO) 1,500 mg, Oral, Daily   vitamin B-12 (CYANOCOBALAMIN) 1,000 mcg, Oral, Daily   vitamin C 100 mg, Oral, Daily   Radiology:   Low dose CT Chest 08/11/2019: 1. Lung-RADS 2, benign appearance or behavior. Continue annual screening with low-dose chest CT without contrast in 12 months. 2. Two-vessel coronary atherosclerosis. 3. Aortic Atherosclerosis   Cardiac Studies:   Ultrasound arterial segmental pressure and exercise ABI 06/11/2017: Ankle-brachial indices and waveforms are normal at rest. Decreased ankle-brachial indices immediately after exercise, right side greater than left. ABIs returned to baseline within 2-3 minutes. Post exercise study is suggestive for mild underlying peripheral vascular disease, particularly in the right lower Extremity.  Right ABI: 0.68, one minute after exercise. 0.97, three minutes after exercise. Left ABI: 0.85, one minute after exercise. 1.09, two minutes after exercise.  Carotid artery duplex 08/23/2017: Minimal stenosis in the bilateral internal carotid artery (minimal).  Minimal stenosis of the left common  carotid artery. Antegrade right vertebral artery flow. Antegrade left vertebral artery flow. F/U if clinically indicated.  Lexiscan sestamibi stress test 08/17/2017: 1. The  exercise tolerance was not assessed due to pharmacologic stress. Resting EKG: Normal sinus rhythm, RBBB.  Stress EKG: Nondiagnostic for ischemia as a pharmacologic stress test. Stress symptoms included dyspnea. 2. Normal SPECT study with normal left ventricular systolic function. Low risk study.  Echocardiogram 12/31/2019:  1. Left ventricular ejection fraction, by estimation, is 60 to 65%. The  left ventricle has normal function. The left ventricle has no regional wall motion abnormalities. Left ventricular diastolic parameters were normal.  2. Right ventricular systolic function is normal. The right ventricular size is normal. Tricuspid regurgitation signal is inadequate for assessing  PA pressure.  3. Left atrial size was mildly dilated.  4. No significant change from 08/24/2017.  Abdominal Aortic Duplex 01/28/2020:  Mild heterogeneous plaque is noted in the mid aorta. No AAA observed.  Normal iliac artery velocity and no e/o hemodynamically significant stenosis.   EKG:  01/12/2020: Normal sinus rhythm at rate of 67 bpm, normal axis.  No evidence of ischemia, normal EKG.    Assessment     ICD-10-CM   1. Paroxysmal atrial fibrillation ([place]). CHA2DS2-VASc Score is 3.  Yearly risk of stroke: 3.2% (A, HTN, Vasc Dz).      I48.0   2. Coronary artery calcification seen on CAT scan  I25.10   3. Stage 3b chronic kidney disease  N18.32   4. Hypercholesteremia  E78.00   5. Primary hypertension  I10   6. Left carotid bruit  R09.89 PCV CAROTID DUPLEX (BILATERAL)  7. Tobacco use disorder  F17.200      Outpatient Encounter Medications as of 03/23/2020  Medication Sig   amphetamine-dextroamphetamine (ADDERALL) 20 MG tablet Take 20-30 mg by mouth See admin instructions. Take 1 tablet every morning then take 1 and 1/2 tablets in the afternoon if needed   apixaban (ELIQUIS) 5 MG TABS tablet Take 1 tablet (5 mg total) by mouth 2 (two) times daily.   Ascorbic Acid (VITAMIN C) 100 MG tablet Take 100  mg by mouth daily.   atenolol (TENORMIN) 25 MG tablet Take 25 mg by mouth daily.  atorvastatin (LIPITOR) 40 MG tablet Take 40 mg by mouth daily.   calcium carbonate (OS-CAL) 600 MG TABS tablet Take 600 mg by mouth daily with breakfast.    candesartan (ATACAND) 32 MG tablet Take 32 mg by mouth daily.    celecoxib (CELEBREX) 200 MG capsule 1 tab po q day with food for pain and  swelling (Patient taking differently: Take 200 mg by mouth daily. )   chlorthalidone (HYGROTON) 25 MG tablet Take 25 mg by mouth daily. Take 1/2 tablet daily.   Coenzyme Q10 (COQ-10 PO) Take 300 mg by mouth daily.   diltiazem (CARDIZEM) 30 MG tablet Take 1 tablet every 4 hours AS NEEDED for AFIB heart rate >100   fenofibrate 160 MG tablet Take 160 mg by mouth daily.    gabapentin (NEURONTIN) 300 MG capsule Take 1 capsule (300 mg total) by mouth at bedtime.   Multiple Vitamin (MULTIVITAMIN WITH MINERALS) TABS tablet Take 1 tablet by mouth daily.   Omega-3 Fatty Acids (FISH OIL PO) Take 2,400 mg by mouth daily.   Turmeric (QC TUMERIC COMPLEX PO) Take 1,500 mg by mouth daily.   vitamin B-12 (CYANOCOBALAMIN) 1000 MCG tablet Take 1,000 mcg by mouth daily.   Facility-Administered Encounter Medications as of 03/23/2020  Medication   methylPREDNISolone sodium succinate (SOLU-MEDROL) 1,000 mg in sodium chloride 0.9 % 100 mL IVPB    Recommendations:   Richard Lynch  is a 69 y.o. hypertension, hyperlipidemia, obstructive sleep apnea on CPAP follows Dr. Maxwell Caul, medically managed mild PAD here for follow-up and for preoperative cardiac risk stratification prior to hand surgery. Presented with A. Fib with RVR on 12/17/19 after presenting to the ED with symptoms of dizziness and heart racing and had successful DCCV in the ED. he has not had any recurrence. He is presently on Eliquis long-term.  I saw him 2 months ago but previous to that he had seen me 2 years ago. Hence I brought him back to reevaluate him. He  underwent hand surgery without periprocedural complication. I reviewed the results of the abdominal aortic duplex, he does have significant aortic plaque but no abdominal aortic aneurysm. I discussed with him regarding tobacco use disorder and increased risk of stroke. Offered medications as well. I had missed left carotid artery bruit, will obtain carotid duplex.  In view of atherosclerotic risk, ongoing tobacco use disorder, reviewed his labs, lipids in excellent control and blood pressure also under excellent control. He is under the care of Dr. [name].  I would like to see him back in 6 months for follow-up of his vascular risks and if he remains stable, I will see him back on annual basis. I spent additional 4 minutes regarding discussion of smoking cessation.  Adrian Prows, MD, [facility] 03/23/2020, 5:38 PM [facility] Pager: [phone] Office: [phone]

## 2020-03-23 ENCOUNTER — Ambulatory Visit: Payer: Medicare Other | Admitting: Cardiology

## 2020-03-23 ENCOUNTER — Encounter: Payer: Self-pay | Admitting: Cardiology

## 2020-03-23 ENCOUNTER — Other Ambulatory Visit: Payer: Self-pay

## 2020-03-23 VITALS — BP 136/69 | HR 79 | Ht 70.0 in | Wt 192.0 lb

## 2020-03-23 DIAGNOSIS — I480 Paroxysmal atrial fibrillation: Secondary | ICD-10-CM | POA: Diagnosis not present

## 2020-03-23 DIAGNOSIS — F17210 Nicotine dependence, cigarettes, uncomplicated: Secondary | ICD-10-CM | POA: Diagnosis not present

## 2020-03-23 DIAGNOSIS — F17200 Nicotine dependence, unspecified, uncomplicated: Secondary | ICD-10-CM

## 2020-03-23 DIAGNOSIS — E7800 Pure hypercholesterolemia, unspecified: Secondary | ICD-10-CM | POA: Diagnosis not present

## 2020-03-23 DIAGNOSIS — I10 Essential (primary) hypertension: Secondary | ICD-10-CM | POA: Diagnosis not present

## 2020-03-23 DIAGNOSIS — N1832 Chronic kidney disease, stage 3b: Secondary | ICD-10-CM | POA: Diagnosis not present

## 2020-03-23 DIAGNOSIS — I2510 Atherosclerotic heart disease of native coronary artery without angina pectoris: Secondary | ICD-10-CM | POA: Diagnosis not present

## 2020-03-23 DIAGNOSIS — R0989 Other specified symptoms and signs involving the circulatory and respiratory systems: Secondary | ICD-10-CM

## 2020-03-31 ENCOUNTER — Other Ambulatory Visit: Payer: Self-pay

## 2020-03-31 ENCOUNTER — Ambulatory Visit: Payer: Medicare Other

## 2020-03-31 DIAGNOSIS — R0989 Other specified symptoms and signs involving the circulatory and respiratory systems: Secondary | ICD-10-CM

## 2020-04-01 IMAGING — CT CT CHEST LUNG CANCER SCREENING LOW DOSE W/O CM
2 of 3 series · 15 of 36 positions shown, 18 images · non-contrast
Comparison: 07/24/2018 screening chest CT.

CLINICAL DATA: 68-year-old asymptomatic male current smoker with 64
pack-year smoking history.

EXAM:
CT CHEST WITHOUT CONTRAST LOW-DOSE FOR LUNG CANCER SCREENING
TECHNIQUE: Multidetector CT imaging of the chest was performed following the
standard protocol without IV contrast.

[Series 2: thorax 5.0 i31f 3 · axial · 0.75mm/px · z∈[-318,-43]mm · 12 of 65 slices shown, 15 images]
[im 5/65  mediastinal]
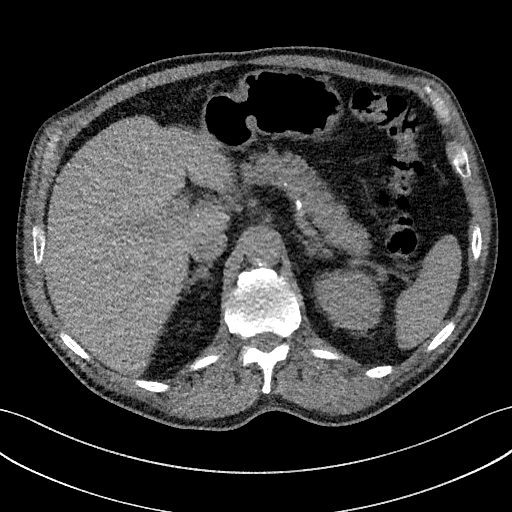
[im 5/65  lung]
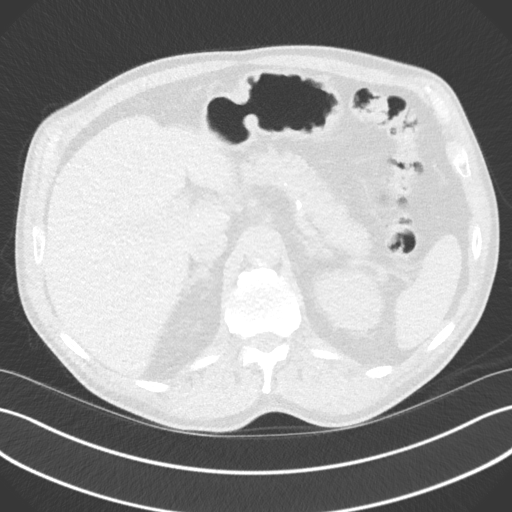
[im 10/65  lung]
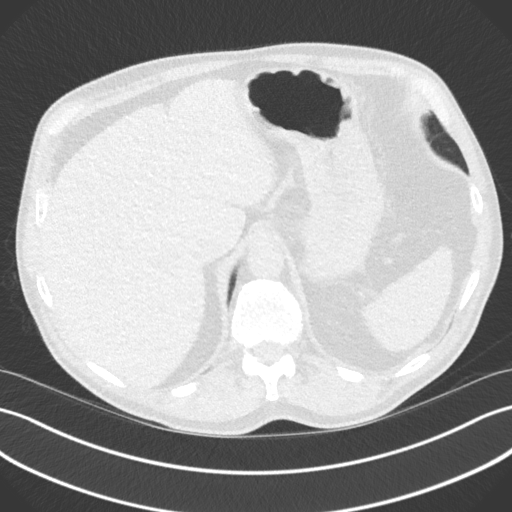
[im 15/65  lung]
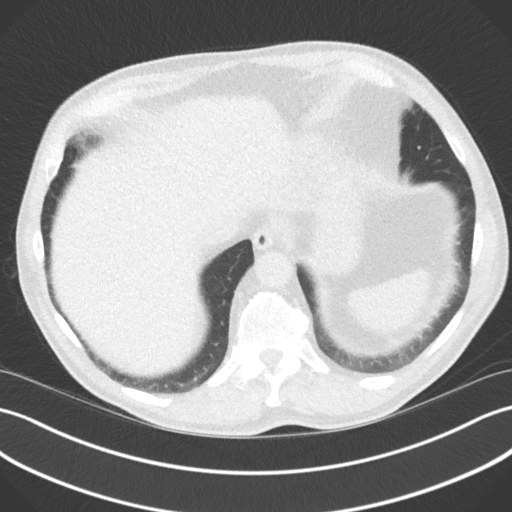
[im 19/65  lung]
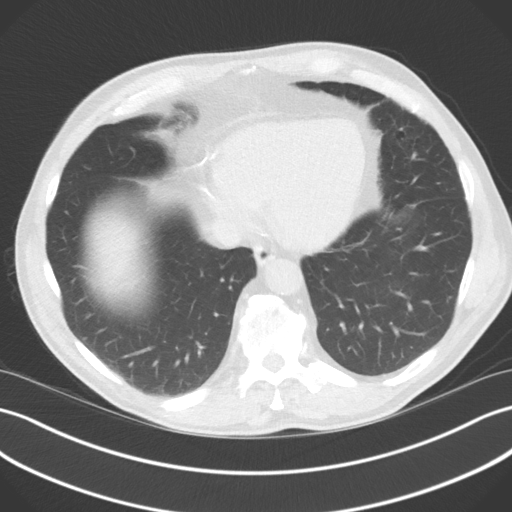
[im 24/65  mediastinal]
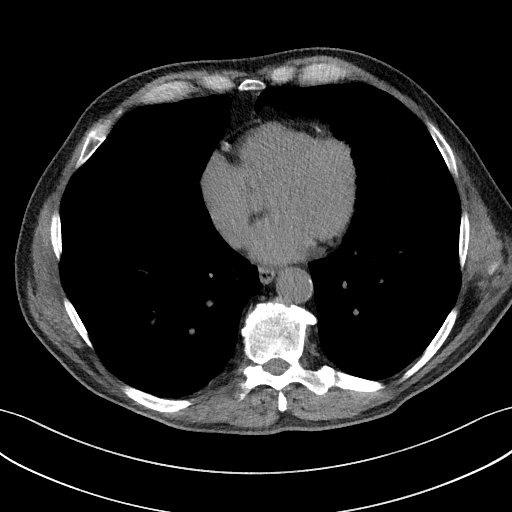
[im 24/65  lung]
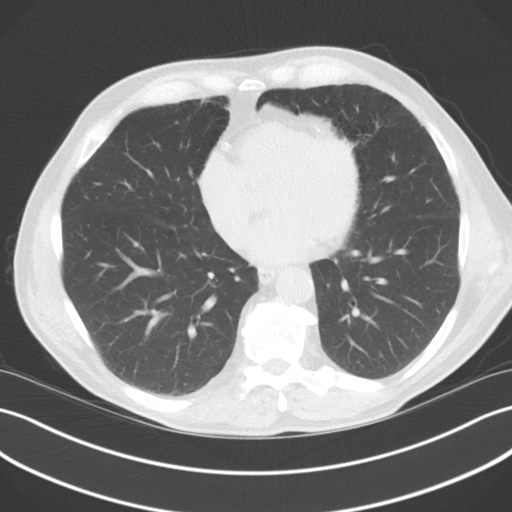
[im 29/65  lung]
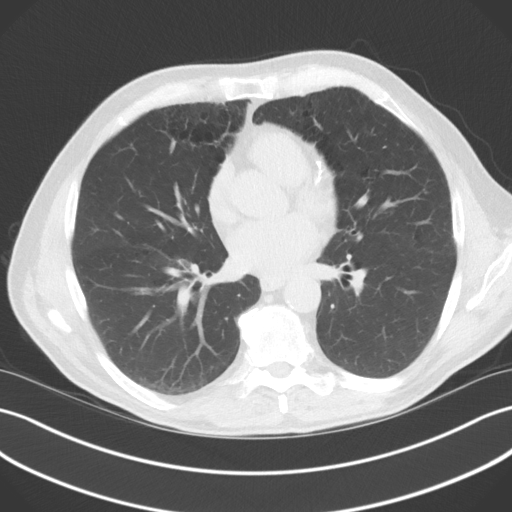
[im 36/65  lung]
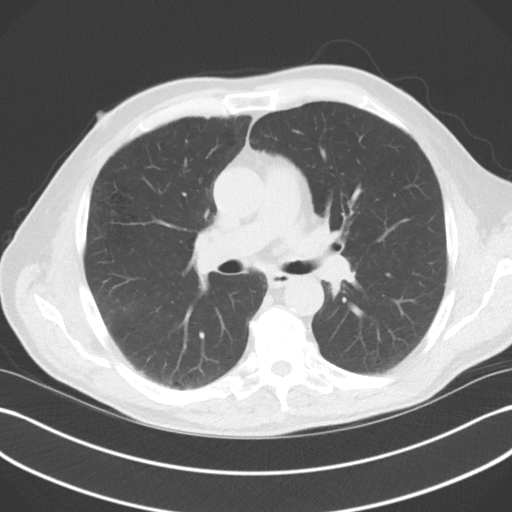
[im 41/65  lung]
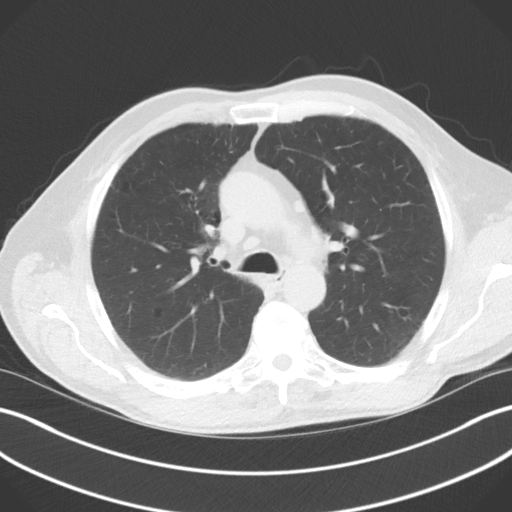
[im 46/65  mediastinal]
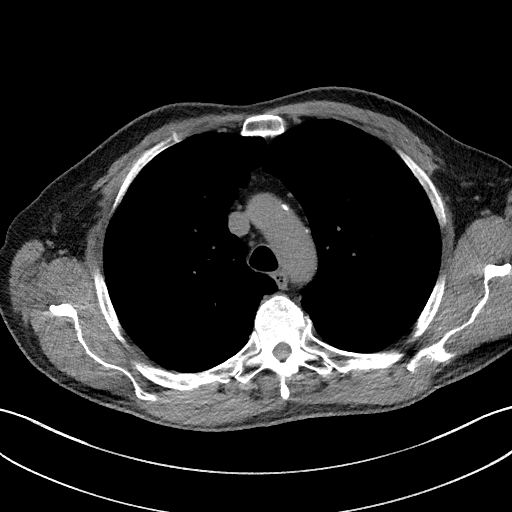
[im 46/65  lung]
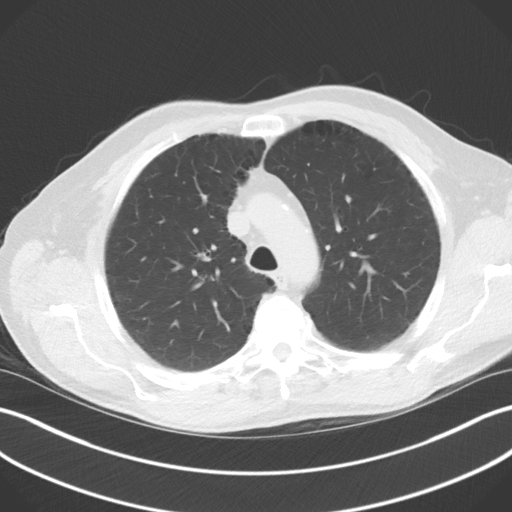
[im 50/65  lung]
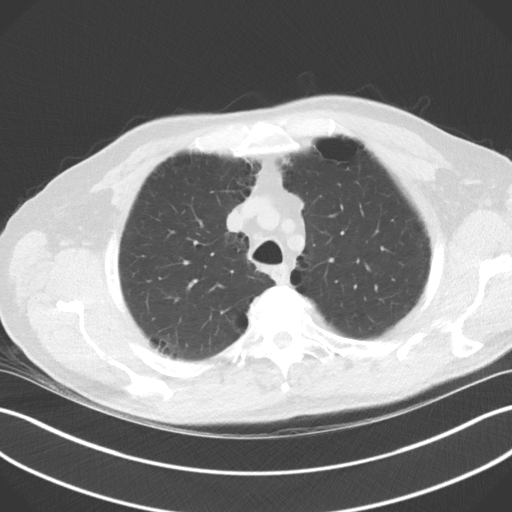
[im 55/65  lung]
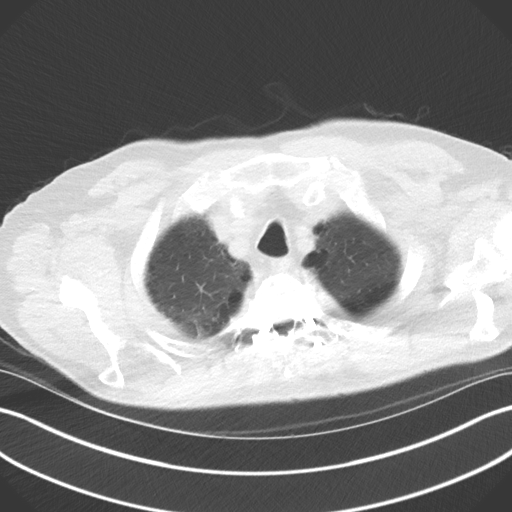
[im 60/65  lung]
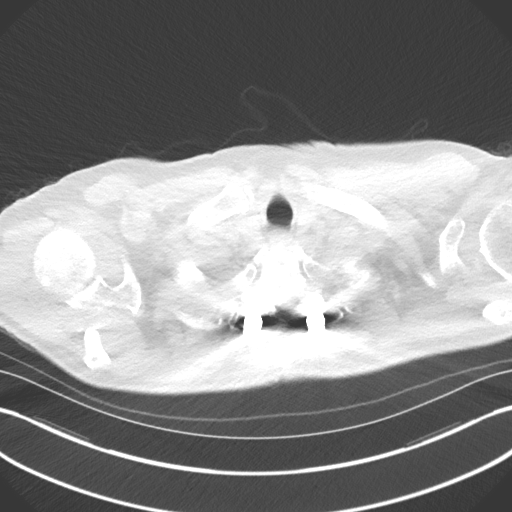

[Series 5: coronal · coronal · 0.66mm/px · 3 of 135 slices shown]
[im 27/135  lung]
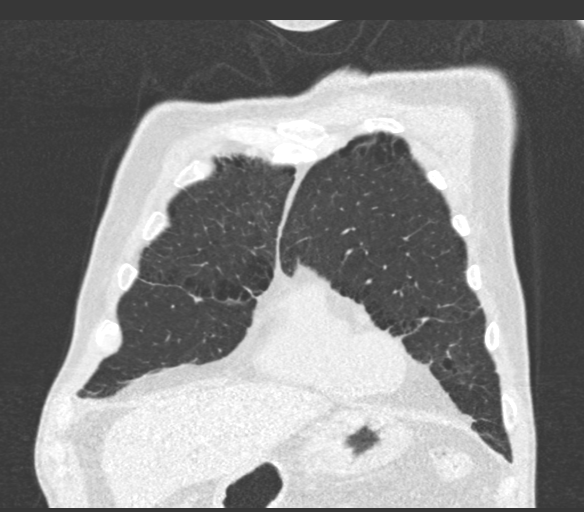
[im 54/135  lung]
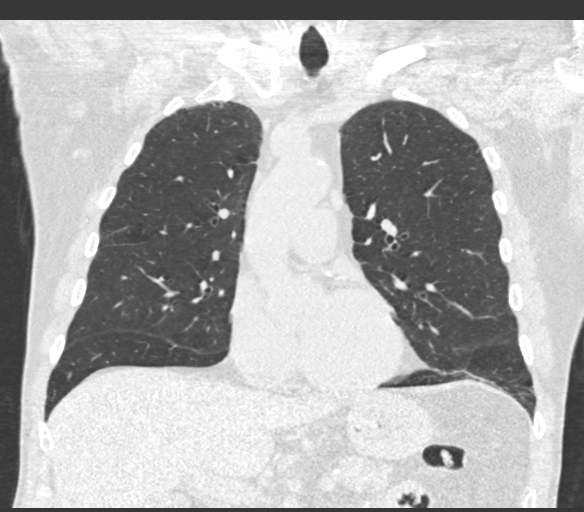
[im 81/135  lung]
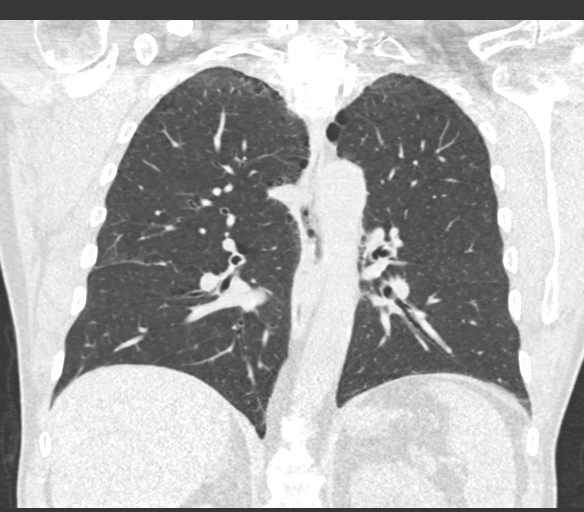

[15 of 36 positions shown; findings below may reference images not displayed]

FINDINGS: Cardiovascular: Normal heart size. No significant pericardial
effusion/thickening. Left anterior descending and right coronary
atherosclerosis. Atherosclerotic nonaneurysmal thoracic aorta.
Normal caliber pulmonary arteries.

Mediastinum/Nodes: No discrete thyroid nodules. Unremarkable
esophagus. No pathologically enlarged axillary, mediastinal or hilar
lymph nodes, noting limited sensitivity for the detection of hilar
adenopathy on this noncontrast study.

Lungs/Pleura: No pneumothorax. No pleural effusion. Mild
centrilobular and paraseptal emphysema. No acute consolidative
airspace disease or lung masses. No significant growth of previously
visualized scattered pulmonary nodules. No new significant pulmonary
nodules.

Upper abdomen: No acute abnormality.

Musculoskeletal: No aggressive appearing focal osseous lesions.
Moderate thoracic spondylosis. Partially visualized bilateral
posterior spinal fusion in the cervical and upper thoracic spine,
extending inferiorly to T3 level.
IMPRESSION: 1. Lung-RADS 2, benign appearance or behavior. Continue annual
screening with low-dose chest CT without contrast in 12 months.
2. Two-vessel coronary atherosclerosis.

Aortic Atherosclerosis ([id number].J) and Emphysema ([id number].M).

## 2020-04-02 NOTE — Progress Notes (Signed)
No significant carotid disease

## 2020-04-05 ENCOUNTER — Ambulatory Visit (HOSPITAL_COMMUNITY)
Admission: RE | Admit: 2020-04-05 | Discharge: 2020-04-05 | Disposition: A | Discharge status: Home / Self Care | Payer: Medicare Other | Source: Ambulatory Visit | Attending: Neurology | Admitting: Neurology

## 2020-04-05 ENCOUNTER — Other Ambulatory Visit: Payer: Self-pay

## 2020-04-05 DIAGNOSIS — G6181 Chronic inflammatory demyelinating polyneuritis: Secondary | ICD-10-CM | POA: Diagnosis present

## 2020-04-05 MED ORDER — IMMUNE GLOBULIN (HUMAN) 20 GM/200ML IV SOLN
1.0000 g/kg | INTRAVENOUS | Status: DC
  Administered 2020-04-05: 85 g via INTRAVENOUS
  Filled 2020-04-05: qty 50

## 2020-04-27 ENCOUNTER — Encounter (HOSPITAL_COMMUNITY): Payer: Medicare Other

## 2020-04-28 ENCOUNTER — Inpatient Hospital Stay (HOSPITAL_COMMUNITY): Admission: RE | Admit: 2020-04-28 | Payer: Medicare Other | Source: Ambulatory Visit

## 2020-05-03 ENCOUNTER — Encounter (HOSPITAL_COMMUNITY)
Admission: RE | Admit: 2020-05-03 | Discharge: 2020-05-03 | Disposition: A | Discharge status: Home / Self Care | Payer: Medicare Other | Source: Ambulatory Visit | Attending: Neurology | Admitting: Neurology

## 2020-05-03 ENCOUNTER — Other Ambulatory Visit: Payer: Self-pay

## 2020-05-03 DIAGNOSIS — G6181 Chronic inflammatory demyelinating polyneuritis: Secondary | ICD-10-CM | POA: Diagnosis not present

## 2020-05-03 DIAGNOSIS — G379 Demyelinating disease of central nervous system, unspecified: Secondary | ICD-10-CM | POA: Diagnosis present

## 2020-05-03 MED ORDER — IMMUNE GLOBULIN (HUMAN) 10 GM/100ML IV SOLN
1.0000 g/kg | INTRAVENOUS | Status: DC
  Administered 2020-05-03: 85 g via INTRAVENOUS
  Filled 2020-05-03: qty 800

## 2020-05-06 DIAGNOSIS — R531 Weakness: Secondary | ICD-10-CM | POA: Diagnosis not present

## 2020-05-06 DIAGNOSIS — Z9889 Other specified postprocedural states: Secondary | ICD-10-CM | POA: Diagnosis not present

## 2020-05-06 DIAGNOSIS — Z981 Arthrodesis status: Secondary | ICD-10-CM | POA: Diagnosis not present

## 2020-05-21 DIAGNOSIS — J449 Chronic obstructive pulmonary disease, unspecified: Secondary | ICD-10-CM | POA: Diagnosis not present

## 2020-05-21 DIAGNOSIS — I480 Paroxysmal atrial fibrillation: Secondary | ICD-10-CM | POA: Diagnosis not present

## 2020-05-21 DIAGNOSIS — J439 Emphysema, unspecified: Secondary | ICD-10-CM | POA: Diagnosis not present

## 2020-05-21 DIAGNOSIS — E782 Mixed hyperlipidemia: Secondary | ICD-10-CM | POA: Diagnosis not present

## 2020-05-21 DIAGNOSIS — E7800 Pure hypercholesterolemia, unspecified: Secondary | ICD-10-CM | POA: Diagnosis not present

## 2020-05-21 DIAGNOSIS — I10 Essential (primary) hypertension: Secondary | ICD-10-CM | POA: Diagnosis not present

## 2020-05-24 ENCOUNTER — Encounter (HOSPITAL_COMMUNITY): Payer: Medicare Other

## 2020-05-26 ENCOUNTER — Encounter (HOSPITAL_COMMUNITY)
Admission: RE | Admit: 2020-05-26 | Discharge: 2020-05-26 | Disposition: A | Discharge status: Home / Self Care | Payer: Medicare Other | Source: Ambulatory Visit | Attending: Neurology | Admitting: Neurology

## 2020-05-26 ENCOUNTER — Other Ambulatory Visit: Payer: Self-pay

## 2020-05-26 DIAGNOSIS — G379 Demyelinating disease of central nervous system, unspecified: Secondary | ICD-10-CM | POA: Diagnosis present

## 2020-05-26 DIAGNOSIS — G6181 Chronic inflammatory demyelinating polyneuritis: Secondary | ICD-10-CM | POA: Diagnosis not present

## 2020-05-26 MED ORDER — IMMUNE GLOBULIN (HUMAN) 5 GM/50ML IV SOLN
85.0000 g | INTRAVENOUS | Status: DC
  Administered 2020-05-26: 85 g via INTRAVENOUS
  Filled 2020-05-26: qty 800

## 2020-05-27 DIAGNOSIS — G5602 Carpal tunnel syndrome, left upper limb: Secondary | ICD-10-CM | POA: Diagnosis not present

## 2020-05-27 DIAGNOSIS — N3281 Overactive bladder: Secondary | ICD-10-CM | POA: Diagnosis not present

## 2020-05-27 DIAGNOSIS — I10 Essential (primary) hypertension: Secondary | ICD-10-CM | POA: Diagnosis not present

## 2020-05-27 DIAGNOSIS — J439 Emphysema, unspecified: Secondary | ICD-10-CM | POA: Diagnosis not present

## 2020-05-27 DIAGNOSIS — R899 Unspecified abnormal finding in specimens from other organs, systems and tissues: Secondary | ICD-10-CM | POA: Diagnosis not present

## 2020-05-27 DIAGNOSIS — G6181 Chronic inflammatory demyelinating polyneuritis: Secondary | ICD-10-CM | POA: Diagnosis not present

## 2020-05-27 DIAGNOSIS — E559 Vitamin D deficiency, unspecified: Secondary | ICD-10-CM | POA: Diagnosis not present

## 2020-05-27 DIAGNOSIS — G4719 Other hypersomnia: Secondary | ICD-10-CM | POA: Diagnosis not present

## 2020-05-27 DIAGNOSIS — J449 Chronic obstructive pulmonary disease, unspecified: Secondary | ICD-10-CM | POA: Diagnosis not present

## 2020-05-27 DIAGNOSIS — E782 Mixed hyperlipidemia: Secondary | ICD-10-CM | POA: Diagnosis not present

## 2020-05-27 DIAGNOSIS — I480 Paroxysmal atrial fibrillation: Secondary | ICD-10-CM | POA: Diagnosis not present

## 2020-05-27 DIAGNOSIS — M130 Polyarthritis, unspecified: Secondary | ICD-10-CM | POA: Diagnosis not present

## 2020-05-28 DIAGNOSIS — G5603 Carpal tunnel syndrome, bilateral upper limbs: Secondary | ICD-10-CM | POA: Diagnosis not present

## 2020-05-28 DIAGNOSIS — M5416 Radiculopathy, lumbar region: Secondary | ICD-10-CM | POA: Diagnosis not present

## 2020-05-28 DIAGNOSIS — G6181 Chronic inflammatory demyelinating polyneuritis: Secondary | ICD-10-CM | POA: Diagnosis not present

## 2020-05-28 DIAGNOSIS — G959 Disease of spinal cord, unspecified: Secondary | ICD-10-CM | POA: Diagnosis not present

## 2020-05-28 DIAGNOSIS — G609 Hereditary and idiopathic neuropathy, unspecified: Secondary | ICD-10-CM | POA: Diagnosis not present

## 2020-05-28 DIAGNOSIS — M5412 Radiculopathy, cervical region: Secondary | ICD-10-CM | POA: Diagnosis not present

## 2020-05-31 DIAGNOSIS — M4803 Spinal stenosis, cervicothoracic region: Secondary | ICD-10-CM | POA: Diagnosis not present

## 2020-05-31 DIAGNOSIS — M4722 Other spondylosis with radiculopathy, cervical region: Secondary | ICD-10-CM | POA: Diagnosis not present

## 2020-05-31 DIAGNOSIS — M4802 Spinal stenosis, cervical region: Secondary | ICD-10-CM | POA: Diagnosis not present

## 2020-05-31 DIAGNOSIS — M4723 Other spondylosis with radiculopathy, cervicothoracic region: Secondary | ICD-10-CM | POA: Diagnosis not present

## 2020-06-02 DIAGNOSIS — Z20822 Contact with and (suspected) exposure to covid-19: Secondary | ICD-10-CM | POA: Diagnosis not present

## 2020-06-07 DIAGNOSIS — Z961 Presence of intraocular lens: Secondary | ICD-10-CM | POA: Diagnosis not present

## 2020-06-07 DIAGNOSIS — H43813 Vitreous degeneration, bilateral: Secondary | ICD-10-CM | POA: Diagnosis not present

## 2020-06-07 DIAGNOSIS — H04123 Dry eye syndrome of bilateral lacrimal glands: Secondary | ICD-10-CM | POA: Diagnosis not present

## 2020-06-07 DIAGNOSIS — H0102A Squamous blepharitis right eye, upper and lower eyelids: Secondary | ICD-10-CM | POA: Diagnosis not present

## 2020-06-07 DIAGNOSIS — H02423 Myogenic ptosis of bilateral eyelids: Secondary | ICD-10-CM | POA: Diagnosis not present

## 2020-06-07 DIAGNOSIS — H47012 Ischemic optic neuropathy, left eye: Secondary | ICD-10-CM | POA: Diagnosis not present

## 2020-06-07 DIAGNOSIS — H2511 Age-related nuclear cataract, right eye: Secondary | ICD-10-CM | POA: Diagnosis not present

## 2020-06-07 DIAGNOSIS — H0102B Squamous blepharitis left eye, upper and lower eyelids: Secondary | ICD-10-CM | POA: Diagnosis not present

## 2020-06-07 DIAGNOSIS — H18593 Other hereditary corneal dystrophies, bilateral: Secondary | ICD-10-CM | POA: Diagnosis not present

## 2020-06-11 ENCOUNTER — Ambulatory Visit (INDEPENDENT_AMBULATORY_CARE_PROVIDER_SITE_OTHER): Payer: Medicare Other | Admitting: Neurology

## 2020-06-11 ENCOUNTER — Encounter: Payer: Self-pay | Admitting: Neurology

## 2020-06-11 ENCOUNTER — Other Ambulatory Visit: Payer: Self-pay

## 2020-06-11 VITALS — BP 95/59 | HR 69 | Resp 18 | Ht 70.0 in | Wt 185.0 lb

## 2020-06-11 DIAGNOSIS — G6181 Chronic inflammatory demyelinating polyneuritis: Secondary | ICD-10-CM

## 2020-06-11 DIAGNOSIS — I2510 Atherosclerotic heart disease of native coronary artery without angina pectoris: Secondary | ICD-10-CM

## 2020-06-11 NOTE — Progress Notes (Signed)
Follow-up Visit   Date: 06/11/20   Richard Lynch MRN: [id number] DOB: [birth year]   Interim History: Richard Lynch is a 69 y.o. left-handed Caucasian male with hypertension, hyperlipidemia, and tobacco use returning to the clinic for follow-up of CIDP.  The patient was accompanied to the clinic by self.  History of present illness: In 2016, he began having bilateral shoulder pain and hand tingling which steadily progressed over the years.  In February 2019, he had cervical decompression and fusion at C5-6 to T1-2 in [place] which helped his shoulder pain and hand tingling.   Over the course of the next few months, he developed clawing of the left hand, and difficulty with extending the fingers. Electrodiagnostic testing in October 2019 performed by [name] at [facility] which was interpreted as severe subacute left C8 radiculopathy, left median neuropathy at the wrist, and possible left ulnar at the wrist.   Based on these findings, he had left carpal tunnel release and Guyon canal release as well as anterior interosseous nerve transfer in December 2019.  Unfortunately, he has had not had any improvement and continues to have persistent numbness/tingling and weakness, especially with finger extension and grip on the left hand.   He also reports about a 2-year history of bilateral feet numbness below the level of the ankle.  Symptoms are constant.  He continues to have constant numbness over the fingers on both hands.  His grip is very weak on the left and he is starting to experience similar symptoms in the right hand.  He has a 20-year history of drinking 2-3 alcoholic beverages nightly.  He also has a 42-year history of smoking and is down to half a pack daily.  He previously had history of prediabetes which was corrected with lifestyle modifications.  He reports that his mother had neuropathy and similar symptoms late in her life.  She was also diabetic.  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   He was evaluated in the office here in June 2020 and diagnosed with chronic inflammatory demyelinating polyradiculoneuropathy based on electrodiagnostic and CSF findings (CSF protein 105, normal cell count).  He had loading dose of Solu-Medrol 1 g at the end of July and started on maintenance 1g every 21 days. In November 2020, he was transitioned from Solumedrol to IVIG due to lack of improvement. Unfortunately, he denies any change in hand strength or paresthesias.    UPDATE 06/11/2020:  He is here for follow-up visit.  He continues to receive IVIG every 3 weeks for CIDP.  He also underwent left ulnar decompression, left radial tunnel release, and right CTS.  Despite these measures, he has not appreciated any improvement.  He has not had any worsening of symptoms either.  He has seen his neurosurgeon in [name] who did not see any explanation of hand weakness stemming from his neck.  He also sought second opinion at [facility] and is scheduled to have repeat EDX next week.    Medications:  Current Outpatient Medications on File Prior to Visit  Medication Sig Dispense Refill  . amphetamine-dextroamphetamine (ADDERALL) 20 MG tablet Take 20-30 mg by mouth See admin instructions. Take 1 tablet every morning then take 1 and 1/2 tablets in the afternoon if needed    . apixaban (ELIQUIS) 5 MG TABS tablet Take 1 tablet (5 mg total) by mouth 2 (two) times daily. 180 tablet 3  . Ascorbic Acid (VITAMIN C) 100 MG  tablet Take 100 mg by mouth daily.    Marland Kitchen atenolol (TENORMIN) 25 MG tablet Take 25 mg by mouth daily.     Marland Kitchen atorvastatin (LIPITOR) 40 MG tablet Take 40 mg by mouth daily.    . calcium carbonate (OS-CAL) 600 MG TABS tablet Take 600 mg by mouth daily with breakfast.     . candesartan (ATACAND) 32 MG tablet Take 32 mg by mouth daily.     . celecoxib (CELEBREX) 200 MG capsule 1 tab po q day with food for pain and  swelling (Patient taking  differently: Take 200 mg by mouth daily. ) 30 capsule 0  . chlorthalidone (HYGROTON) 25 MG tablet Take 25 mg by mouth daily. Take 1/2 tablet daily.    . Coenzyme Q10 (COQ-10 PO) Take 300 mg by mouth daily.    Marland Kitchen diltiazem (CARDIZEM) 30 MG tablet Take 1 tablet every 4 hours AS NEEDED for AFIB heart rate >100 45 tablet 1  . fenofibrate 160 MG tablet Take 160 mg by mouth daily.     Marland Kitchen gabapentin (NEURONTIN) 300 MG capsule Take 1 capsule (300 mg total) by mouth at bedtime. 30 capsule 5  . Multiple Vitamin (MULTIVITAMIN WITH MINERALS) TABS tablet Take 1 tablet by mouth daily.    . Omega-3 Fatty Acids (FISH OIL PO) Take 2,400 mg by mouth daily.    . Turmeric (QC TUMERIC COMPLEX PO) Take 1,500 mg by mouth daily.    . vitamin B-12 (CYANOCOBALAMIN) 1000 MCG tablet Take 1,000 mcg by mouth daily.     Current Facility-Administered Medications on File Prior to Visit  Medication Dose Route Frequency Provider Last Rate Last Admin  . methylPREDNISolone sodium succinate (SOLU-MEDROL) 1,000 mg in sodium chloride 0.9 % 100 mL IVPB  1,000 mg Intravenous Q28 days Sieara Bremer K, DO        Allergies: No Known Allergies   Vital Signs:  BP (!) 95/59   Pulse 69   Resp 18   Ht $R'5\' 10"'eE$  (1.778 m)   Wt 185 lb (83.9 kg)   SpO2 95%   BMI 26.54 kg/m     Neurological Exam: MENTAL STATUS including orientation to time, place, person, recent and remote memory, attention span and concentration, language, and fund of knowledge is normal.  Speech is not dysarthric.  CRANIAL NERVES: Face is symmetric.  No ptosis.  MOTOR: Marked atrophy of the left intrinsic hand muscles and forearm. Left claw hand deformity. No  fasciculations or abnormal movements.    Upper Extremity:  Right  Left  Deltoid  5/5   5/5   Biceps  5/5   5/5   Triceps  5/5   5/5   Infraspinatus 5/5  5/5  Medial pectoralis 5/5  5/5  Wrist extensors  5/5   5/5   Wrist flexors  5/5   5/5   Finger extensors  5/5   2+/5   Finger flexors  5/5   5-/5    Dorsal interossei  5/5   3+/5   Abductor pollicis  5/5   1-/5   Tone (Ashworth scale)  0  0   Lower Extremity:  Right  Left  Hip flexors  5/5   5/5   Hip extensors  5/5   5/5   Adductor 5/5  5/5  Abductor 5/5  5/5  Knee flexors  5/5   5/5   Knee extensors  5/5   5/5   Dorsiflexors  5/5   5/5   Plantarflexors  5/5  5/5   Toe extensors  5/5   5/5   Toe flexors  5/5   5/5   Tone (Ashworth scale)  0  0    MSRs:                                           Right        Left brachioradialis 2+  2+  biceps 2+  2+  triceps 2+  2+  patellar 1+  2+  ankle jerk 0  0   SENSORY:  Vibration is reduced to 50% at the ankles, intact at the knees and MCP bilaterally. Temperature remains reduced over the palms/fingers.  COORDINATION/GAIT: Gait narrow based and stable.   Data: DATA: NCS/EMG of the legs 04/17/2019: The electrophysiologic findings are consistent with a sensorimotor polyneuropathy, demyelinating and axonal loss in type affecting the lower extremities.  The presence of sural-sparing suggests findings may be associated with a polyradiculoneuropathy, correlate clinically.  NCS/EMG of the arms 04/01/2019 This is a complex study of the upper extremities.  Findings are as follows:  1. Bilateral median neuropathy at or distal to the wrist (severe), consistent with a clinical diagnosis of carpal tunnel syndrome.   2. Bilateral ulnar neuropathy with slowing across the elbow, mild on the right and severe on the left. 3. Chronic left posterior interosseous neuropathy, very severe.  4. Chronic C7-8 radiculopathy affecting bilateral upper extremities, moderate in degree electrically.  MRI cervical spine wo contrast 07/04/2018: 1. C5-T3 posterior-lateral fusion with decompressive laminectomy from C5-6 to T1-2. There is no arthrodesis by CT and the C5 and C6 lateral mass screws are loose. 2. Disc and facet degeneration causes multilevel foraminal impingement. Greatest foraminal impingement is seen  on the left at C2-3, bilaterally at C3-4, right at C5-6, and right at T1-2. 3. Widely patent canal at levels of laminectomy. Noncompressive spinal stenosis seen at C3-4 and C4-5. 4. Septated fluid collection within the lower laminectomy defect without dural mass effect, best attributed to seroma.  MRI thoracic spine wo contrast 07/05/2018: Cervical and thoracic fusion and laminectomy. 15 mm fluid collection in the laminectomy bed. No cord compression or cord signal abnormality. Left paracentral disc protrusion T7-8 unchanged from the prior MRI.  CSF testing 05/08/2019:   W0 R1 G73 P105*, 2 oligoclonal bands, normal IgG index, MBP and ACE  Labs 04/17/2019: ESR 6, vitamin B1 28, TSH 1.26, copper 106, SPEP with IFE no M protein, vitamin B12 >2000, folate 19.6   IMPRESSION: Complex case given overlapping radiculopathy, entrapment neuropathy, and polyneuropathy Chronic inflammatory demyelinating polyradiculoneuropathy affecting arms > legs as seen by demyelinating and axonal loss changes on EMG and supported by spinal analysis which shows a albuminocytologic dissociation (diagnoaed July 2020).  He was started on Solu-Medrol at the end of July 2020 which was continued through October 2020.  He regained reflexes in the arms and full strength in the right hand, but no improvement in the left.  Therefore, it was decided to switch therapy to IVIG in November 2020 which he remains.   Unfortunately, he has not appreciated any improvement, so we discussed tapering/stopping IVIG.  We will plan on waiting to see what the results of his repeat EDX are prior to making a decision on his IVIG  Follow-up after evaluation at [facility]  Total time spent reviewing records, interview, history/exam, documentation, and coordination of care on day of  encounter:  30 min    Thank you for allowing me to participate in patient's care.  If I can answer any additional questions, I would be pleased to do so.     Sincerely,    Daishon Chui K. Posey Pronto, DO

## 2020-06-11 NOTE — Patient Instructions (Signed)
It was great to see you today  Please follow-up with your primary care doctor or cardiology for your low blood pressure.  Continue to stay well hydrated  Call the office to schedule visit after your evaluation at [facility]

## 2020-06-15 DIAGNOSIS — G5622 Lesion of ulnar nerve, left upper limb: Secondary | ICD-10-CM | POA: Diagnosis not present

## 2020-06-15 DIAGNOSIS — G5603 Carpal tunnel syndrome, bilateral upper limbs: Secondary | ICD-10-CM | POA: Diagnosis not present

## 2020-06-15 DIAGNOSIS — G6181 Chronic inflammatory demyelinating polyneuritis: Secondary | ICD-10-CM | POA: Diagnosis not present

## 2020-06-15 DIAGNOSIS — G609 Hereditary and idiopathic neuropathy, unspecified: Secondary | ICD-10-CM | POA: Diagnosis not present

## 2020-06-15 DIAGNOSIS — G959 Disease of spinal cord, unspecified: Secondary | ICD-10-CM | POA: Diagnosis not present

## 2020-06-15 DIAGNOSIS — M5416 Radiculopathy, lumbar region: Secondary | ICD-10-CM | POA: Diagnosis not present

## 2020-06-15 DIAGNOSIS — M5412 Radiculopathy, cervical region: Secondary | ICD-10-CM | POA: Diagnosis not present

## 2020-06-16 ENCOUNTER — Inpatient Hospital Stay (HOSPITAL_COMMUNITY): Admission: RE | Admit: 2020-06-16 | Payer: Medicare Other | Source: Ambulatory Visit

## 2020-06-17 DIAGNOSIS — E7800 Pure hypercholesterolemia, unspecified: Secondary | ICD-10-CM | POA: Diagnosis not present

## 2020-06-17 DIAGNOSIS — I10 Essential (primary) hypertension: Secondary | ICD-10-CM | POA: Diagnosis not present

## 2020-06-17 DIAGNOSIS — J439 Emphysema, unspecified: Secondary | ICD-10-CM | POA: Diagnosis not present

## 2020-06-17 DIAGNOSIS — J449 Chronic obstructive pulmonary disease, unspecified: Secondary | ICD-10-CM | POA: Diagnosis not present

## 2020-06-17 DIAGNOSIS — I480 Paroxysmal atrial fibrillation: Secondary | ICD-10-CM | POA: Diagnosis not present

## 2020-06-17 DIAGNOSIS — E782 Mixed hyperlipidemia: Secondary | ICD-10-CM | POA: Diagnosis not present

## 2020-06-23 ENCOUNTER — Encounter (HOSPITAL_COMMUNITY)
Admission: RE | Admit: 2020-06-23 | Discharge: 2020-06-23 | Disposition: A | Discharge status: Home / Self Care | Payer: Medicare Other | Source: Ambulatory Visit | Attending: Neurology | Admitting: Neurology

## 2020-06-23 ENCOUNTER — Other Ambulatory Visit: Payer: Self-pay

## 2020-06-23 DIAGNOSIS — G610 Guillain-Barre syndrome: Secondary | ICD-10-CM | POA: Insufficient documentation

## 2020-06-23 MED ORDER — IMMUNE GLOBULIN (HUMAN) 5 GM/50ML IV SOLN
85.0000 g | INTRAVENOUS | Status: DC
  Administered 2020-06-23: 85 g via INTRAVENOUS
  Filled 2020-06-23: qty 800

## 2020-06-23 NOTE — Progress Notes (Signed)
Pt declined 30 minute post observation wait instead waited 15 minutes.  He verbalized understanding of when to seek immediate medical assistance

## 2020-07-07 ENCOUNTER — Ambulatory Visit (HOSPITAL_COMMUNITY): Payer: Medicare Other

## 2020-07-12 DIAGNOSIS — J449 Chronic obstructive pulmonary disease, unspecified: Secondary | ICD-10-CM | POA: Diagnosis not present

## 2020-07-12 DIAGNOSIS — J439 Emphysema, unspecified: Secondary | ICD-10-CM | POA: Diagnosis not present

## 2020-07-12 DIAGNOSIS — I10 Essential (primary) hypertension: Secondary | ICD-10-CM | POA: Diagnosis not present

## 2020-07-12 DIAGNOSIS — E7800 Pure hypercholesterolemia, unspecified: Secondary | ICD-10-CM | POA: Diagnosis not present

## 2020-07-12 DIAGNOSIS — E782 Mixed hyperlipidemia: Secondary | ICD-10-CM | POA: Diagnosis not present

## 2020-07-12 DIAGNOSIS — I480 Paroxysmal atrial fibrillation: Secondary | ICD-10-CM | POA: Diagnosis not present

## 2020-07-14 ENCOUNTER — Ambulatory Visit (HOSPITAL_COMMUNITY): Payer: Medicare Other

## 2020-07-28 ENCOUNTER — Encounter (HOSPITAL_COMMUNITY)
Admission: RE | Admit: 2020-07-28 | Discharge: 2020-07-28 | Disposition: A | Discharge status: Home / Self Care | Payer: Medicare Other | Source: Ambulatory Visit | Attending: Neurology | Admitting: Neurology

## 2020-07-28 ENCOUNTER — Other Ambulatory Visit: Payer: Self-pay

## 2020-07-28 DIAGNOSIS — G6181 Chronic inflammatory demyelinating polyneuritis: Secondary | ICD-10-CM | POA: Diagnosis not present

## 2020-07-28 MED ORDER — IMMUNE GLOBULIN (HUMAN) 5 GM/50ML IV SOLN
85.0000 g | INTRAVENOUS | Status: DC
  Administered 2020-07-28: 85 g via INTRAVENOUS
  Filled 2020-07-28: qty 800

## 2020-07-29 DIAGNOSIS — M5412 Radiculopathy, cervical region: Secondary | ICD-10-CM | POA: Diagnosis not present

## 2020-07-29 DIAGNOSIS — Z9889 Other specified postprocedural states: Secondary | ICD-10-CM | POA: Diagnosis not present

## 2020-08-02 DIAGNOSIS — I480 Paroxysmal atrial fibrillation: Secondary | ICD-10-CM | POA: Diagnosis not present

## 2020-08-02 DIAGNOSIS — J439 Emphysema, unspecified: Secondary | ICD-10-CM | POA: Diagnosis not present

## 2020-08-02 DIAGNOSIS — I10 Essential (primary) hypertension: Secondary | ICD-10-CM | POA: Diagnosis not present

## 2020-08-02 DIAGNOSIS — E782 Mixed hyperlipidemia: Secondary | ICD-10-CM | POA: Diagnosis not present

## 2020-08-02 DIAGNOSIS — E7800 Pure hypercholesterolemia, unspecified: Secondary | ICD-10-CM | POA: Diagnosis not present

## 2020-08-02 DIAGNOSIS — J449 Chronic obstructive pulmonary disease, unspecified: Secondary | ICD-10-CM | POA: Diagnosis not present

## 2020-08-04 ENCOUNTER — Other Ambulatory Visit: Payer: Self-pay | Admitting: *Deleted

## 2020-08-04 DIAGNOSIS — Z87891 Personal history of nicotine dependence: Secondary | ICD-10-CM

## 2020-08-04 DIAGNOSIS — F17210 Nicotine dependence, cigarettes, uncomplicated: Secondary | ICD-10-CM

## 2020-08-18 ENCOUNTER — Ambulatory Visit (HOSPITAL_COMMUNITY): Payer: Medicare Other

## 2020-08-19 ENCOUNTER — Ambulatory Visit
Admission: RE | Admit: 2020-08-19 | Discharge: 2020-08-19 | Disposition: A | Discharge status: Home / Self Care | Payer: Medicare Other | Source: Ambulatory Visit | Attending: Acute Care | Admitting: Acute Care

## 2020-08-19 DIAGNOSIS — I2510 Atherosclerotic heart disease of native coronary artery without angina pectoris: Secondary | ICD-10-CM | POA: Diagnosis not present

## 2020-08-19 DIAGNOSIS — Z87891 Personal history of nicotine dependence: Secondary | ICD-10-CM

## 2020-08-19 DIAGNOSIS — J432 Centrilobular emphysema: Secondary | ICD-10-CM | POA: Diagnosis not present

## 2020-08-19 DIAGNOSIS — M5134 Other intervertebral disc degeneration, thoracic region: Secondary | ICD-10-CM | POA: Diagnosis not present

## 2020-08-19 DIAGNOSIS — F17210 Nicotine dependence, cigarettes, uncomplicated: Secondary | ICD-10-CM

## 2020-08-20 ENCOUNTER — Ambulatory Visit: Payer: Medicare Other

## 2020-08-23 NOTE — Progress Notes (Signed)
Please call patient and let them  know their  low dose Ct was read as a Lung RADS 2: nodules that are benign in appearance and behavior with a very low likelihood of becoming a clinically active cancer due to size or lack of growth. Recommendation per radiology is for a repeat LDCT in 12 months. .Please let them  know we will order and schedule their  annual screening scan for 07/2021. Please let them  know there was notation of CAD on their  scan.  Please remind the patient  that this is a non-gated exam therefore degree or severity of disease  cannot be determined. Please have them  follow up with their PCP regarding potential risk factor modification, dietary therapy or pharmacologic therapy if clinically indicated. Pt.  is  currently on statin therapy. Please place order for annual  screening scan for  07/2021 and fax results to PCP. Thanks so much. 

## 2020-08-26 ENCOUNTER — Other Ambulatory Visit: Payer: Self-pay | Admitting: *Deleted

## 2020-08-26 DIAGNOSIS — F17210 Nicotine dependence, cigarettes, uncomplicated: Secondary | ICD-10-CM

## 2020-08-26 DIAGNOSIS — Z87891 Personal history of nicotine dependence: Secondary | ICD-10-CM

## 2020-09-05 ENCOUNTER — Other Ambulatory Visit: Payer: Self-pay | Admitting: Neurology

## 2020-09-06 DIAGNOSIS — Z23 Encounter for immunization: Secondary | ICD-10-CM | POA: Diagnosis not present

## 2020-09-08 ENCOUNTER — Other Ambulatory Visit: Payer: Self-pay

## 2020-09-08 ENCOUNTER — Ambulatory Visit (HOSPITAL_COMMUNITY)
Admission: RE | Admit: 2020-09-08 | Discharge: 2020-09-08 | Disposition: A | Discharge status: Home / Self Care | Payer: Medicare Other | Source: Ambulatory Visit | Attending: Neurology | Admitting: Neurology

## 2020-09-08 DIAGNOSIS — G6181 Chronic inflammatory demyelinating polyneuritis: Secondary | ICD-10-CM | POA: Insufficient documentation

## 2020-09-08 MED ORDER — IMMUNE GLOBULIN (HUMAN) 5 GM/50ML IV SOLN
85.0000 g | INTRAVENOUS | Status: DC
  Administered 2020-09-08: 85 g via INTRAVENOUS
  Filled 2020-09-08: qty 800

## 2020-09-14 DIAGNOSIS — Z20822 Contact with and (suspected) exposure to covid-19: Secondary | ICD-10-CM | POA: Diagnosis not present

## 2020-09-20 DIAGNOSIS — K219 Gastro-esophageal reflux disease without esophagitis: Secondary | ICD-10-CM | POA: Diagnosis not present

## 2020-09-20 DIAGNOSIS — I10 Essential (primary) hypertension: Secondary | ICD-10-CM | POA: Diagnosis not present

## 2020-09-20 DIAGNOSIS — I480 Paroxysmal atrial fibrillation: Secondary | ICD-10-CM | POA: Diagnosis not present

## 2020-09-20 DIAGNOSIS — J449 Chronic obstructive pulmonary disease, unspecified: Secondary | ICD-10-CM | POA: Diagnosis not present

## 2020-09-20 DIAGNOSIS — J439 Emphysema, unspecified: Secondary | ICD-10-CM | POA: Diagnosis not present

## 2020-09-20 DIAGNOSIS — E7800 Pure hypercholesterolemia, unspecified: Secondary | ICD-10-CM | POA: Diagnosis not present

## 2020-09-20 DIAGNOSIS — E782 Mixed hyperlipidemia: Secondary | ICD-10-CM | POA: Diagnosis not present

## 2020-09-22 ENCOUNTER — Encounter: Payer: Self-pay | Admitting: Cardiology

## 2020-09-22 ENCOUNTER — Ambulatory Visit: Payer: Medicare Other | Admitting: Cardiology

## 2020-09-22 ENCOUNTER — Other Ambulatory Visit: Payer: Self-pay

## 2020-09-22 VITALS — BP 167/76 | HR 66 | Resp 15 | Ht 70.0 in | Wt 192.2 lb

## 2020-09-22 DIAGNOSIS — I10 Essential (primary) hypertension: Secondary | ICD-10-CM | POA: Diagnosis not present

## 2020-09-22 DIAGNOSIS — I2510 Atherosclerotic heart disease of native coronary artery without angina pectoris: Secondary | ICD-10-CM

## 2020-09-22 DIAGNOSIS — F17200 Nicotine dependence, unspecified, uncomplicated: Secondary | ICD-10-CM

## 2020-09-22 DIAGNOSIS — I480 Paroxysmal atrial fibrillation: Secondary | ICD-10-CM | POA: Diagnosis not present

## 2020-09-22 MED ORDER — CHLORTHALIDONE 25 MG PO TABS: 25.0000 mg | ORAL_TABLET | Freq: Every day | ORAL | 3 refills | Status: DC

## 2020-09-22 NOTE — Progress Notes (Signed)
Primary Physician/Referring:  Vernie Shanks, MD  Patient ID: Richard Lynch, male    DOB: [birth year], 69 y.o.   MRN: [id number]  Chief Complaint  Patient presents with  . Atrial Fibrillation  . CArotid Bruit  . PAD  . Follow-up    6 month   HPI:    Richard Lynch  is a 69 y.o. hypertension, hyperlipidemia, obstructive sleep apnea on CPAP follows Dr. Maxwell Caul, medically managed mild PAD, paroxysmal atrial fibrillation with RVR on 12/17/2019 presenting to the ED SP cardioversion, had brief episode 3 weeks later at home is here for 19-month follow-up.   He is presently on Eliquis long-term.  Couple weeks he has had nasal congestion and has been taking Mucinex and has noticed elevated blood pressure.  Except for left knee arthritis pain, chronic mild dyspnea, denies chest pain, palpitations or symptoms of claudication.  Continues to smoke about a pack of cigarettes a day.  He has had a normal stress tests and 07/2017. Carotid ultrasound showed minimal stenosis in bilateral internal carotid artery in 2018. Echocardiogram on 01/01/2020 showing essentially normal echocardiogram.     Past Medical History:  Diagnosis Date  . Constipation due to opioid therapy 12/22/2015  . Emphysema lung ([place])   . Gait abnormality 02/20/2017  . GERD (gastroesophageal reflux disease)   . High cholesterol   . Hypertension   . OSA (obstructive sleep apnea)    on CPAP  . Primary localized osteoarthritis of right knee 12/08/2015  . Septic olecranon bursitis 06/2015   MSSA   Past Surgical History:  Procedure Laterality Date  . COLONOSCOPY W/ BIOPSIES AND POLYPECTOMY    . ELBOW SURGERY  08/2015   bilateral  . EYE SURGERY     'repair of tear to left eye from pliers'  . HAND SURGERY Left 08/2018   Carpal Tunnel Surgery- [facility]  . HERNIA REPAIR  07/2010  . KNEE SURGERY     3 times on both knee's each  . MULTIPLE TOOTH EXTRACTIONS    . SHOULDER ARTHROSCOPY    . TOTAL HIP ARTHROPLASTY  08/2010  . TOTAL KNEE  ARTHROPLASTY Right 12/20/2015  . TOTAL KNEE ARTHROPLASTY Right 12/20/2015   Procedure: TOTAL KNEE ARTHROPLASTY;  Surgeon: [name], MD;  Location: [facility];  Service: Orthopedics;  Laterality: Right;   Family History  Problem Relation Age of Onset  . Emphysema Father   . Heart disease Father   . Heart disease Mother   . Colon cancer Mother   . Breast cancer Mother     Social History   Tobacco Use  . Smoking status: Current Every Day Smoker    Packs/day: 1.00    Types: Cigarettes    Start date: 01/24/1973  . Smokeless tobacco: Never Used  . Tobacco comment: 1 pack daily  Substance Use Topics  . Alcohol use: Yes    Alcohol/week: 4.0 standard drinks    Types: 2 Cans of beer, 2 Standard drinks or equivalent per week    Comment: 1-2 drinks per night   Marital Status: Divorced  ROS  Review of Systems  Cardiovascular: Positive for dyspnea on exertion (chronic). Negative for chest pain and leg swelling.  Musculoskeletal: Positive for arthritis (knee).  Gastrointestinal: Negative for melena.  Neurological: Positive for numbness (hands) and paresthesias (hands).   Objective  Blood pressure (!) 167/76, pulse 66, resp. rate 15, height 5\' 10"  (1.778 m), weight 192 lb 3.2 oz (87.2 kg), SpO2 99 %.  Vitals with BMI 09/22/2020 09/08/2020  09/08/2020  Height 5\' 10"  - -  Weight 192 lbs 3 oz - -  BMI 62.94 - -  Systolic 765 465 035  Diastolic 76 74 71  Pulse 66 68 63     Physical Exam Cardiovascular:     Rate and Rhythm: Normal rate and regular rhythm.     Pulses:          Carotid pulses are 2+ on the right side and 2+ on the left side with bruit.      Femoral pulses are 2+ on the right side and 2+ on the left side.      Dorsalis pedis pulses are 2+ on the right side and 0 on the left side.       Posterior tibial pulses are 1+ on the right side and 0 on the left side.     Heart sounds: Normal heart sounds. No murmur heard.  No gallop.      Comments: No leg edema, no JVD. Pulmonary:      Effort: Pulmonary effort is normal.     Breath sounds: Normal breath sounds.  Abdominal:     General: Bowel sounds are normal.     Palpations: Abdomen is soft.    Laboratory examination:   Recent Labs    12/17/19 0211  NA 138  K 3.8  CL 110  CO2 22  GLUCOSE 114*  BUN 35*  CREATININE 2.15*  CALCIUM 9.0  GFRNONAA 31*  GFRAA 36*   CrCl cannot be calculated (Patient's most recent lab result is older than the maximum 21 days allowed.).  CMP Latest Ref Rng & Units 12/17/2019 04/17/2019 03/06/2017  Glucose 70 - 99 mg/dL 114(H) - -  BUN 8 - 23 mg/dL 35(H) - -  Creatinine 0.61 - 1.24 mg/dL 2.15(H) - -  Sodium 135 - 145 mmol/L 138 - -  Potassium 3.5 - 5.1 mmol/L 3.8 - -  Chloride 98 - 111 mmol/L 110 - -  CO2 22 - 32 mmol/L 22 - -  Calcium 8.9 - 10.3 mg/dL 9.0 - -  Total Protein 6.1 - 8.1 g/dL - 6.7 6.9  Total Bilirubin 0.3 - 1.2 mg/dL - - -  Alkaline Phos 38 - 126 U/L - - -  AST 15 - 41 U/L - - -  ALT 17 - 63 U/L - - -   CBC Latest Ref Rng & Units 12/17/2019 12/23/2015 12/22/2015  WBC 4.0 - 10.5 K/uL 11.6(H) 12.1(H) 17.5(H)  Hemoglobin 13.0 - 17.0 g/dL 12.3(L) 9.1(L) 9.2(L)  Hematocrit 39 - 52 % 38.2(L) 27.3(L) 28.2(L)  Platelets 150 - 400 K/uL 295 306 319   Recent Labs    12/17/19 0212  TSH 2.061    External labs:   Cholesterol, total 103.000 m 05/27/2020 HDL 49.000 mg 05/27/2020 LDL-C 39.000 mg 05/27/2020 Triglycerides 65.000 mg 05/27/2020  Hemoglobin 13.100 g/d 05/27/2020 Platelets 295.000 K/ 12/17/2019  Creatinine, Serum 1.140 mg/ 05/27/2020 Potassium 4.100 mm 05/27/2020 ALT (SGPT) 17.000 U/L 05/27/2020    Glucose Random 81.000 02/27/2020 BUN 25.000 02/27/2020 Creatinine, Serum 1.160 02/27/2020  Cholesterol, total 128.000 12/10/2019 Triglycerides 132.000 12/10/2019 HDL 51.000 12/10/2019 LDL 54.000 12/10/2019  Hemoglobin 12.300 12/17/2019; Platelets 295.000 12/17/2019  Creatinine, Serum 2.150 12/17/2019 Potassium 3.800 12/17/2019 ALT (SGPT) 21.000 12/10/2019  TSH 2.061  12/17/2019; A1C 5.500 12/10/2019  Medications and allergies  No Known Allergies    Current Outpatient Medications on File Prior to Visit  Medication Sig Dispense Refill  . amphetamine-dextroamphetamine (ADDERALL) 20 MG tablet Take 20-30 mg by mouth  See admin instructions. Take 1 tablet every morning then take 1 and 1/2 tablets in the afternoon if needed    . apixaban (ELIQUIS) 5 MG TABS tablet Take 1 tablet (5 mg total) by mouth 2 (two) times daily. 180 tablet 3  . Ascorbic Acid (VITAMIN C) 100 MG tablet Take 100 mg by mouth daily.    Marland Kitchen atenolol (TENORMIN) 25 MG tablet Take 25 mg by mouth daily.     Marland Kitchen atorvastatin (LIPITOR) 40 MG tablet Take 40 mg by mouth daily.    . candesartan (ATACAND) 32 MG tablet Take 32 mg by mouth daily.     . celecoxib (CELEBREX) 200 MG capsule 1 tab po q day with food for pain and  swelling (Patient taking differently: Take 200 mg by mouth daily. ) 30 capsule 0  . chlorthalidone (HYGROTON) 25 MG tablet Take 25 mg by mouth daily. Take 1/2 tablet daily.    . Coenzyme Q10 (COQ-10 PO) Take 300 mg by mouth daily.    Marland Kitchen diltiazem (CARDIZEM) 30 MG tablet Take 1 tablet every 4 hours AS NEEDED for AFIB heart rate >100 45 tablet 1  . gabapentin (NEURONTIN) 300 MG capsule TAKE 1 CAPSULE BY MOUTH EVERYDAY AT BEDTIME 30 capsule 2  . Multiple Vitamin (MULTIVITAMIN WITH MINERALS) TABS tablet Take 1 tablet by mouth daily.    . Turmeric (QC TUMERIC COMPLEX PO) Take 1,500 mg by mouth daily.    . valACYclovir (VALTREX) 500 MG tablet Take 500 mg by mouth daily.    . vitamin B-12 (CYANOCOBALAMIN) 1000 MCG tablet Take 1,000 mcg by mouth daily.    . calcium carbonate (OS-CAL) 600 MG TABS tablet Take 600 mg by mouth daily with breakfast.  (Patient not taking: Reported on 09/22/2020)    . fenofibrate 160 MG tablet Take 160 mg by mouth daily.  (Patient not taking: Reported on 09/22/2020)    . Omega-3 Fatty Acids (FISH OIL PO) Take 2,400 mg by mouth daily. (Patient not taking: Reported on  09/22/2020)     Current Facility-Administered Medications on File Prior to Visit  Medication Dose Route Frequency Provider Last Rate Last Admin  . methylPREDNISolone sodium succinate (SOLU-MEDROL) 1,000 mg in sodium chloride 0.9 % 100 mL IVPB  1,000 mg Intravenous Q28 days Alda Berthold, DO       PRIVIGEN Every 3 weeks infusion by Neurology for  CIDP  Radiology:   Low dose CT Chest 08/11/2019: 1. Lung-RADS 2, benign appearance or behavior. Continue annual screening with low-dose chest CT without contrast in 12 months. 2. Two-vessel coronary atherosclerosis. 3. Aortic Atherosclerosis   Cardiac Studies:   Ultrasound arterial segmental pressure and exercise ABI 06/11/2017: Ankle-brachial indices and waveforms are normal at rest. Decreased ankle-brachial indices immediately after exercise, right side greater than left. ABIs returned to baseline within 2-3 minutes. Post exercise study is suggestive for mild underlying peripheral vascular disease, particularly in the right lower Extremity.  Right ABI: 0.68, one minute after exercise. 0.97, three minutes after exercise. Left ABI: 0.85, one minute after exercise. 1.09, two minutes after exercise.  Carotid artery duplex 08/23/2017: Minimal stenosis in the bilateral internal carotid artery (minimal).  Minimal stenosis of the left common  carotid artery. Antegrade right vertebral artery flow. Antegrade left vertebral artery flow. F/U if clinically indicated.  Lexiscan sestamibi stress test 08/17/2017: 1. The exercise tolerance was not assessed due to pharmacologic stress. Resting EKG: Normal sinus rhythm, RBBB.  Stress EKG: Nondiagnostic for ischemia as a pharmacologic stress test. Stress symptoms  included dyspnea. 2. Normal SPECT study with normal left ventricular systolic function. Low risk study.  Echocardiogram 12/31/2019:  1. Left ventricular ejection fraction, by estimation, is 60 to 65%. The  left ventricle has normal function. The left  ventricle has no regional wall motion abnormalities. Left ventricular diastolic parameters were normal.  2. Right ventricular systolic function is normal. The right ventricular size is normal. Tricuspid regurgitation signal is inadequate for assessing  PA pressure.  3. Left atrial size was mildly dilated.  4. No significant change from 08/24/2017.  Abdominal Aortic Duplex 01/28/2020:  Mild heterogeneous plaque is noted in the mid aorta. No AAA observed.  Normal iliac artery velocity and no e/o hemodynamically significant stenosis.   EKG:  EKG 09/22/2020: Normal sinus rhythm with rate of 68 bpm, left atrial enlargement, normal axis.  No evidence of ischemia.   01/12/2020: Normal sinus rhythm at rate of 67 bpm, normal axis.  No evidence of ischemia, normal EKG.    Assessment     ICD-10-CM   1. Paroxysmal atrial fibrillation (HCC)  I48.0 EKG 12-Lead  2. Primary hypertension  I10   3. Tobacco use disorder  F17.200   4. Coronary artery calcification seen on CAT scan  I25.10    CHA2DS2-VASc Score is 3.  Yearly risk of stroke: 3.2% (A, HTN, vasc dz).  Score of 1=0.6; 2=2.2; 3=3.2; 4=4.8; 5=7.2; 6=9.8; 7=>9.8) -(CHF; HTN; vasc disease DM,  Male = 1; Age <65 =0; 65-74 = 1,  >75 =2; stroke/embolism= 2).    Recommendations:   Richard Lynch  is a 69 y.o. hypertension, hyperlipidemia, obstructive sleep apnea on CPAP follows Dr. Maxwell Caul, medically managed mild PAD, paroxysmal atrial fibrillation with RVR on 12/17/2019 presenting to the ED SP cardioversion, here for 56-month follow-up.   He is presently on Eliquis long-term.  He is here on a 74-month office visit, he has noticed elevated blood pressure, chlorthalidone was discontinued as he was having borderline low blood pressure during IgG infusion that he undergoes for CIDP.  He was asymptomatic.  Today his blood pressure is elevated.  Advised him to increase his chlorthalidone from 12.5 mg to 25 mg in the morning, I will obtain a BMP in 2  weeks.  Smoking cessation again discussed with the patient.  I reviewed his lipids and external labs, normal renal function, normal lipids.  His fenofibric acid and over-the-counter fish oil capsules have been discontinued by PCP in view of normalization of triglycerides.  We will continue to trend his triglycerides in future.  With regard to peripheral arterial disease, he has reduced pulses in his left lower extremity, he is asymptomatic and hence continue present medical therapy and primary prevention for atherosclerotic cardiovascular disease.  This was a 40-minute encounter with evaluation of multiple medications and management of complex medical issues.   Adrian Prows, MD, [facility] 09/22/2020, 10:16 AM Office: [phone] Pager: [phone]

## 2020-09-23 DIAGNOSIS — Z23 Encounter for immunization: Secondary | ICD-10-CM | POA: Diagnosis not present

## 2020-09-28 DIAGNOSIS — G6181 Chronic inflammatory demyelinating polyneuritis: Secondary | ICD-10-CM | POA: Diagnosis not present

## 2020-09-28 DIAGNOSIS — G4733 Obstructive sleep apnea (adult) (pediatric): Secondary | ICD-10-CM | POA: Diagnosis not present

## 2020-09-28 DIAGNOSIS — I739 Peripheral vascular disease, unspecified: Secondary | ICD-10-CM | POA: Diagnosis not present

## 2020-09-28 DIAGNOSIS — I480 Paroxysmal atrial fibrillation: Secondary | ICD-10-CM | POA: Diagnosis not present

## 2020-09-28 DIAGNOSIS — G4719 Other hypersomnia: Secondary | ICD-10-CM | POA: Diagnosis not present

## 2020-09-28 DIAGNOSIS — E782 Mixed hyperlipidemia: Secondary | ICD-10-CM | POA: Diagnosis not present

## 2020-09-28 DIAGNOSIS — F17291 Nicotine dependence, other tobacco product, in remission: Secondary | ICD-10-CM | POA: Diagnosis not present

## 2020-09-28 DIAGNOSIS — E559 Vitamin D deficiency, unspecified: Secondary | ICD-10-CM | POA: Diagnosis not present

## 2020-09-28 DIAGNOSIS — R3589 Other polyuria: Secondary | ICD-10-CM | POA: Diagnosis not present

## 2020-09-28 DIAGNOSIS — J40 Bronchitis, not specified as acute or chronic: Secondary | ICD-10-CM | POA: Diagnosis not present

## 2020-09-28 DIAGNOSIS — I10 Essential (primary) hypertension: Secondary | ICD-10-CM | POA: Diagnosis not present

## 2020-09-28 DIAGNOSIS — M130 Polyarthritis, unspecified: Secondary | ICD-10-CM | POA: Diagnosis not present

## 2020-09-29 ENCOUNTER — Other Ambulatory Visit: Payer: Self-pay

## 2020-09-29 ENCOUNTER — Ambulatory Visit (HOSPITAL_COMMUNITY)
Admission: RE | Admit: 2020-09-29 | Discharge: 2020-09-29 | Disposition: A | Discharge status: Home / Self Care | Payer: Medicare Other | Source: Ambulatory Visit | Attending: Neurology | Admitting: Neurology

## 2020-09-29 DIAGNOSIS — G6181 Chronic inflammatory demyelinating polyneuritis: Secondary | ICD-10-CM | POA: Diagnosis not present

## 2020-09-29 MED ORDER — IMMUNE GLOBULIN (HUMAN) 5 GM/50ML IV SOLN
85.0000 g | INTRAVENOUS | Status: DC
  Administered 2020-09-29: 85 g via INTRAVENOUS
  Filled 2020-09-29: qty 800

## 2020-10-07 DIAGNOSIS — I10 Essential (primary) hypertension: Secondary | ICD-10-CM | POA: Diagnosis not present

## 2020-10-08 LAB — BASIC METABOLIC PANEL
BUN/Creatinine Ratio: 22 (ref 10–24)
BUN: 28 mg/dL — ABNORMAL HIGH (ref 8–27)
CO2: 21 mmol/L (ref 20–29)
Calcium: 8.8 mg/dL (ref 8.6–10.2)
Chloride: 100 mmol/L (ref 96–106)
Creatinine, Ser: 1.26 mg/dL (ref 0.76–1.27)
GFR calc Af Amer: 68 mL/min/{1.73_m2} (ref >59)
GFR calc non Af Amer: 59 mL/min/{1.73_m2} — ABNORMAL LOW (ref >59)
Glucose: 106 mg/dL — ABNORMAL HIGH (ref 65–99)
Potassium: 3.8 mmol/L (ref 3.5–5.2)
Sodium: 138 mmol/L (ref 134–144)

## 2020-10-20 ENCOUNTER — Ambulatory Visit (HOSPITAL_COMMUNITY)
Admission: RE | Admit: 2020-10-20 | Discharge: 2020-10-20 | Disposition: A | Discharge status: Home / Self Care | Payer: Medicare Other | Source: Ambulatory Visit | Attending: Neurology | Admitting: Neurology

## 2020-10-20 ENCOUNTER — Telehealth: Payer: Self-pay

## 2020-10-20 ENCOUNTER — Other Ambulatory Visit: Payer: Self-pay

## 2020-10-20 DIAGNOSIS — G6181 Chronic inflammatory demyelinating polyneuritis: Secondary | ICD-10-CM | POA: Diagnosis not present

## 2020-10-20 MED ORDER — IMMUNE GLOBULIN (HUMAN) 5 GM/50ML IV SOLN
85.0000 g | INTRAVENOUS | Status: DC
  Administered 2020-10-20: 10:00:00 85 g via INTRAVENOUS
  Filled 2020-10-20: qty 800

## 2020-10-20 MED ORDER — DIPHENHYDRAMINE HCL 50 MG/ML IJ SOLN
INTRAMUSCULAR | Status: AC
  Administered 2020-10-20: 13:00:00 25 mg
  Filled 2020-10-20: qty 1

## 2020-10-20 NOTE — Telephone Encounter (Signed)
Noted. Hold IVIG for now.  We will check on pt tomorrow via telephone.

## 2020-10-20 NOTE — Telephone Encounter (Signed)
Infusion center called in stated pt was having an allergic reaction to IV infusion. Pt was 3 hours into infusion when started to complain of itching of rt flank, groin and leg, infusion was turned down symptoms continued so infusion was stopped, at 12:36. Pt still has whelps, NO pain NO SOB, infusion center asking for Verbal Orders for 25mg  of IV benadryl Dr at Nurses Desk [facility] verbal orders for IV Benadryl 25 mg . And to hold rest of pt infusion

## 2020-10-20 NOTE — Progress Notes (Addendum)
1230: pt called out with c/o itching during IVIG infusion.  Redness, urticaria, to bilateral groins, back of legs, bilateral armpits/flanks noted. Infusion rate decreased to 204 1236: pt still with c/o itching, redness, urticaria noted as above, infusion stopped, about of IVIG received, MD paged 1310: Dr Allena Katz notified, give benadryl 25 mg IV now, discontinue IVIG, will continue to monitor

## 2020-10-21 NOTE — Telephone Encounter (Signed)
Called patient and left a message for a call back to see how he is doing after his allergic reaction to his infusion yesterday.

## 2020-10-21 NOTE — Progress Notes (Signed)
VSS, pt denies itching, urticaria resolving, pt verbalized understanding seek emergency help for difficulty breathing, swelling, CP, pt d/c home

## 2020-10-21 NOTE — Telephone Encounter (Signed)
Patient returned call and informed me that he has been doing well after his allergic reaction from his IVIG.   Patient also stated that he saw a Neurosurgeon from [facility] and was informed that there was nothing else he would do for him. However, the patient asked the Neurosurgeon about his pain at night and the Neurosurgeon wanted him to ask Dr. Allena Katz about increasing his Gabapentin from 1 tablet to 2 tablets at night for his pain. Patient stated that he has started taking 2 tablets at night of his gabapentin and it has been helping.   Informed patient that would forward this message to Dr. Allena Katz and give him a call when I hear back. Patient is aware that Dr. Allena Katz has left for the day and that we are closed tomorrow.

## 2020-10-25 DIAGNOSIS — M130 Polyarthritis, unspecified: Secondary | ICD-10-CM | POA: Diagnosis not present

## 2020-10-25 DIAGNOSIS — I480 Paroxysmal atrial fibrillation: Secondary | ICD-10-CM | POA: Diagnosis not present

## 2020-10-25 DIAGNOSIS — G6181 Chronic inflammatory demyelinating polyneuritis: Secondary | ICD-10-CM | POA: Diagnosis not present

## 2020-10-25 DIAGNOSIS — G4733 Obstructive sleep apnea (adult) (pediatric): Secondary | ICD-10-CM | POA: Diagnosis not present

## 2020-10-25 DIAGNOSIS — I739 Peripheral vascular disease, unspecified: Secondary | ICD-10-CM | POA: Diagnosis not present

## 2020-10-25 DIAGNOSIS — I10 Essential (primary) hypertension: Secondary | ICD-10-CM | POA: Diagnosis not present

## 2020-10-25 DIAGNOSIS — F17200 Nicotine dependence, unspecified, uncomplicated: Secondary | ICD-10-CM | POA: Diagnosis not present

## 2020-10-25 DIAGNOSIS — G4719 Other hypersomnia: Secondary | ICD-10-CM | POA: Diagnosis not present

## 2020-10-25 DIAGNOSIS — J40 Bronchitis, not specified as acute or chronic: Secondary | ICD-10-CM | POA: Diagnosis not present

## 2020-10-25 DIAGNOSIS — F17291 Nicotine dependence, other tobacco product, in remission: Secondary | ICD-10-CM | POA: Diagnosis not present

## 2020-10-25 DIAGNOSIS — E782 Mixed hyperlipidemia: Secondary | ICD-10-CM | POA: Diagnosis not present

## 2020-10-25 DIAGNOSIS — E559 Vitamin D deficiency, unspecified: Secondary | ICD-10-CM | POA: Diagnosis not present

## 2020-10-25 MED ORDER — GABAPENTIN 300 MG PO CAPS
600.0000 mg | ORAL_CAPSULE | Freq: Every day | ORAL | 5 refills | Status: DC
Start: 2020-10-25 — End: 2020-12-23

## 2020-10-25 MED ORDER — GABAPENTIN 300 MG PO CAPS
ORAL_CAPSULE | ORAL | 5 refills | Status: DC
Start: 2020-10-25 — End: 2020-10-25

## 2020-10-25 NOTE — Telephone Encounter (Signed)
Called [facility] (Short stay) at [phone] and informed scheduling that patient will be holding infusions until Dr. Allena Katz see's him on 12/27/20 to decide what she would like patient to do. Patient is aware

## 2020-10-25 NOTE — Telephone Encounter (Signed)
Called patient and informed him that Gabapentin has been sent to his pharmacy. Also informed patient to hold on his infusions and to see Dr. Allena Katz in 2 months for a f/u. Patient was transferred to the front to schedule a 2 month f/u.

## 2020-10-25 NOTE — Telephone Encounter (Addendum)
Please inform pt that Rx for gabapentin 600mg  at bedtime as been sent to his [facility].  Let's hold on IVIG for now.  I will see him back in 2 months.

## 2020-10-25 NOTE — Addendum Note (Signed)
Addended by: Glendale Chard on: 10/25/2020 02:15 PM   Modules accepted: Orders

## 2020-11-03 ENCOUNTER — Other Ambulatory Visit: Payer: Self-pay

## 2020-11-03 ENCOUNTER — Encounter: Payer: Self-pay | Admitting: Cardiology

## 2020-11-03 ENCOUNTER — Ambulatory Visit: Payer: Medicare Other | Admitting: Cardiology

## 2020-11-03 VITALS — BP 130/74 | HR 76 | Resp 16 | Ht 70.0 in | Wt 189.0 lb

## 2020-11-03 DIAGNOSIS — I10 Essential (primary) hypertension: Secondary | ICD-10-CM

## 2020-11-03 DIAGNOSIS — I2510 Atherosclerotic heart disease of native coronary artery without angina pectoris: Secondary | ICD-10-CM

## 2020-11-03 DIAGNOSIS — Z716 Tobacco abuse counseling: Secondary | ICD-10-CM

## 2020-11-03 DIAGNOSIS — F17210 Nicotine dependence, cigarettes, uncomplicated: Secondary | ICD-10-CM | POA: Diagnosis not present

## 2020-11-03 DIAGNOSIS — F17200 Nicotine dependence, unspecified, uncomplicated: Secondary | ICD-10-CM

## 2020-11-03 MED ORDER — BUPROPION HCL ER (SR) 150 MG PO TB12: 150.0000 mg | ORAL_TABLET | Freq: Two times a day (BID) | ORAL | 3 refills | Status: DC

## 2020-11-03 MED ORDER — NICOTINE 7 MG/24HR TD PT24: 7.0000 mg | MEDICATED_PATCH | Freq: Every day | TRANSDERMAL | 0 refills | Status: DC

## 2020-11-03 MED ORDER — NICOTINE 21 MG/24HR TD PT24: 21.0000 mg | MEDICATED_PATCH | Freq: Every day | TRANSDERMAL | 0 refills | Status: DC

## 2020-11-03 MED ORDER — NICOTINE 14 MG/24HR TD PT24: 14.0000 mg | MEDICATED_PATCH | Freq: Every day | TRANSDERMAL | 0 refills | Status: DC

## 2020-11-03 NOTE — Progress Notes (Signed)
Primary Physician/Referring:  Vernie Shanks, MD  Patient ID: Richard Lynch, male    DOB: Jun 17, 1951, 70 y.o.   MRN: [id number]  No chief complaint on file.  HPI:    Richard Lynch  is a 70 y.o. hypertension, hyperlipidemia, obstructive sleep apnea on CPAP follows Dr. Maxwell Caul, medically managed mild PAD, paroxysmal atrial fibrillation with RVR on 12/17/2019 presenting to the ED SP cardioversion, chronic inflammatory demyelinating polyneuropathy (CIDP) and received immunotherapy and also steroid therapy in the past, presents here for follow-up of hypertension.  On his last office visit I increase his chlorthalidone to 25 mg daily for uncontrolled hypertension.  I had discussed smoking cessation.  Continues to smoke about a pack of cigarettes a day.  He is frustrated about CIDP.  He is wondering what he should do next.  He has an appointment to see Dr. [name] soon.  Dyspnea has remained stable, he has not had any chest discomfort or chest tightness.  Past Medical History:  Diagnosis Date  . Constipation due to opioid therapy 12/22/2015  . Emphysema lung ([place])   . Gait abnormality 02/20/2017  . GERD (gastroesophageal reflux disease)   . High cholesterol   . Hypertension   . OSA (obstructive sleep apnea)    on CPAP  . Primary localized osteoarthritis of right knee 12/08/2015  . Septic olecranon bursitis 06/2015   MSSA   Past Surgical History:  Procedure Laterality Date  . COLONOSCOPY W/ BIOPSIES AND POLYPECTOMY    . ELBOW SURGERY  08/2015   bilateral  . EYE SURGERY     'repair of tear to left eye from pliers'  . HAND SURGERY Left 08/2018   Carpal Tunnel Surgery- [place]  . HERNIA REPAIR  07/2010  . KNEE SURGERY     3 times on both knee's each  . MULTIPLE TOOTH EXTRACTIONS    . SHOULDER ARTHROSCOPY    . TOTAL HIP ARTHROPLASTY  08/2010  . TOTAL KNEE ARTHROPLASTY Right 12/20/2015  . TOTAL KNEE ARTHROPLASTY Right 12/20/2015   Procedure: TOTAL KNEE ARTHROPLASTY;  Surgeon:  [name], MD;  Location: [facility];  Service: Orthopedics;  Laterality: Right;   Family History  Problem Relation Age of Onset  . Emphysema Father   . Heart disease Father   . Heart disease Mother   . Colon cancer Mother   . Breast cancer Mother     Social History   Tobacco Use  . Smoking status: Current Every Day Smoker    Packs/day: 1.00    Types: Cigarettes    Start date: 01/24/1973  . Smokeless tobacco: Never Used  . Tobacco comment: 1 pack daily  Substance Use Topics  . Alcohol use: Yes    Alcohol/week: 4.0 standard drinks    Types: 2 Cans of beer, 2 Standard drinks or equivalent per week    Comment: 1-2 drinks per night   Marital Status: Divorced  ROS  Review of Systems  Cardiovascular: Positive for dyspnea on exertion (chronic). Negative for chest pain and leg swelling.  Musculoskeletal: Positive for arthritis (knee).  Gastrointestinal: Negative for melena.  Neurological: Positive for numbness (hands) and paresthesias (hands).   Objective  Blood pressure 130/74, pulse 76, resp. rate 16, height 5\' 10"  (1.778 m), weight 189 lb (85.7 kg), SpO2 99 %.  Vitals with BMI 11/03/2020 11/03/2020 10/20/2020  Height - 5\' 10"  -  Weight - 189 lbs -  BMI - 00.86 -  Systolic 761 950 932  Diastolic 74 76 71  Pulse 76 71 73     Physical Exam Cardiovascular:     Rate and Rhythm: Normal rate and regular rhythm.     Pulses:          Carotid pulses are 2+ on the right side and 2+ on the left side with bruit.      Femoral pulses are 2+ on the right side and 2+ on the left side.      Dorsalis pedis pulses are 2+ on the right side and 0 on the left side.       Posterior tibial pulses are 1+ on the right side and 0 on the left side.     Heart sounds: Normal heart sounds. No murmur heard. No gallop.      Comments: No leg edema, no JVD. Pulmonary:     Effort: Pulmonary effort is normal.     Breath sounds: Normal breath sounds.  Abdominal:     General: Bowel sounds are normal.      Palpations: Abdomen is soft.    Laboratory examination:   Recent Labs    12/17/19 0211 10/07/20 1515  NA 138 138  K 3.8 3.8  CL 110 100  CO2 22 21  GLUCOSE 114* 106*  BUN 35* 28*  CREATININE 2.15* 1.26  CALCIUM 9.0 8.8  GFRNONAA 31* 59*  GFRAA 36* 68   CrCl cannot be calculated (Patient's most recent lab result is older than the maximum 21 days allowed.).  CMP Latest Ref Rng & Units 10/07/2020 12/17/2019 04/17/2019  Glucose 65 - 99 mg/dL 106(H) 114(H) -  BUN 8 - 27 mg/dL 28(H) 35(H) -  Creatinine 0.76 - 1.27 mg/dL 1.26 2.15(H) -  Sodium 134 - 144 mmol/L 138 138 -  Potassium 3.5 - 5.2 mmol/L 3.8 3.8 -  Chloride 96 - 106 mmol/L 100 110 -  CO2 20 - 29 mmol/L 21 22 -  Calcium 8.6 - 10.2 mg/dL 8.8 9.0 -  Total Protein 6.1 - 8.1 g/dL - - 6.7  Total Bilirubin 0.3 - 1.2 mg/dL - - -  Alkaline Phos 38 - 126 U/L - - -  AST 15 - 41 U/L - - -  ALT 17 - 63 U/L - - -   CBC Latest Ref Rng & Units 12/17/2019 12/23/2015 12/22/2015  WBC 4.0 - 10.5 K/uL 11.6(H) 12.1(H) 17.5(H)  Hemoglobin 13.0 - 17.0 g/dL 12.3(L) 9.1(L) 9.2(L)  Hematocrit 39.0 - 52.0 % 38.2(L) 27.3(L) 28.2(L)  Platelets 150 - 400 K/uL 295 306 319   Recent Labs    12/17/19 0212  TSH 2.061    External labs:   Cholesterol, total 103.000 m 05/27/2020 HDL 49.000 mg 05/27/2020 LDL-C 39.000 mg 05/27/2020 Triglycerides 65.000 mg 05/27/2020  Hemoglobin 13.100 g/d 05/27/2020 Platelets 295.000 K/ 12/17/2019  Creatinine, Serum 1.140 mg/ 05/27/2020 Potassium 4.100 mm 05/27/2020 ALT (SGPT) 17.000 U/L 05/27/2020    Glucose Random 81.000 02/27/2020 BUN 25.000 02/27/2020 Creatinine, Serum 1.160 02/27/2020  Cholesterol, total 128.000 12/10/2019 Triglycerides 132.000 12/10/2019 HDL 51.000 12/10/2019 LDL 54.000 12/10/2019  Hemoglobin 12.300 12/17/2019; Platelets 295.000 12/17/2019  Creatinine, Serum 2.150 12/17/2019 Potassium 3.800 12/17/2019 ALT (SGPT) 21.000 12/10/2019  TSH 2.061 12/17/2019; A1C 5.500 12/10/2019  Medications and allergies  No  Known Allergies    Current Outpatient Medications on File Prior to Visit  Medication Sig Dispense Refill  . amphetamine-dextroamphetamine (ADDERALL) 20 MG tablet Take 20-30 mg by mouth See admin instructions. Take 1 tablet every morning then take 1 and 1/2 tablets in the afternoon if needed    .  apixaban (ELIQUIS) 5 MG TABS tablet Take 1 tablet (5 mg total) by mouth 2 (two) times daily. 180 tablet 3  . Ascorbic Acid (VITAMIN C) 100 MG tablet Take 100 mg by mouth daily.    Marland Kitchen atenolol (TENORMIN) 25 MG tablet Take 25 mg by mouth daily.     Marland Kitchen atorvastatin (LIPITOR) 40 MG tablet Take 40 mg by mouth daily.    . candesartan (ATACAND) 32 MG tablet Take 32 mg by mouth daily.     . celecoxib (CELEBREX) 200 MG capsule 1 tab po q day with food for pain and  swelling (Patient taking differently: Take 200 mg by mouth daily.) 30 capsule 0  . chlorthalidone (HYGROTON) 25 MG tablet Take 1 tablet (25 mg total) by mouth daily. Take 1/2 tablet daily. 90 tablet 3  . Coenzyme Q10 (COQ-10 PO) Take 300 mg by mouth daily.    Marland Kitchen diltiazem (CARDIZEM) 30 MG tablet Take 1 tablet every 4 hours AS NEEDED for AFIB heart rate >100 45 tablet 1  . gabapentin (NEURONTIN) 300 MG capsule Take 2 capsules (600 mg total) by mouth at bedtime. 60 capsule 5  . Multiple Vitamin (MULTIVITAMIN WITH MINERALS) TABS tablet Take 1 tablet by mouth daily.    . Turmeric (QC TUMERIC COMPLEX PO) Take 1,500 mg by mouth daily.    . valACYclovir (VALTREX) 500 MG tablet Take 500 mg by mouth daily.    . vitamin B-12 (CYANOCOBALAMIN) 1000 MCG tablet Take 1,000 mcg by mouth daily.     No current facility-administered medications on file prior to visit.   PRIVIGEN Every 3 weeks infusion by Neurology for  CIDP  Radiology:   Low dose CT Chest 08/11/2019: 1. Lung-RADS 2, benign appearance or behavior. Continue annual screening with low-dose chest CT without contrast in 12 months. 2. Two-vessel coronary atherosclerosis. 3. Aortic Atherosclerosis    Cardiac Studies:   Ultrasound arterial segmental pressure and exercise ABI 06/11/2017: Ankle-brachial indices and waveforms are normal at rest. Decreased ankle-brachial indices immediately after exercise, right side greater than left. ABIs returned to baseline within 2-3 minutes. Post exercise study is suggestive for mild underlying peripheral vascular disease, particularly in the right lower Extremity.  Right ABI: 0.68, one minute after exercise. 0.97, three minutes after exercise. Left ABI: 0.85, one minute after exercise. 1.09, two minutes after exercise.  Carotid artery duplex 08/23/2017: Minimal stenosis in the bilateral internal carotid artery (minimal).  Minimal stenosis of the left common  carotid artery. Antegrade right vertebral artery flow. Antegrade left vertebral artery flow. F/U if clinically indicated.  Lexiscan sestamibi stress test 08/17/2017: 1. The exercise tolerance was not assessed due to pharmacologic stress. Resting EKG: Normal sinus rhythm, RBBB.  Stress EKG: Nondiagnostic for ischemia as a pharmacologic stress test. Stress symptoms included dyspnea. 2. Normal SPECT study with normal left ventricular systolic function. Low risk study.  Echocardiogram 12/31/2019:  1. Left ventricular ejection fraction, by estimation, is 60 to 65%. The  left ventricle has normal function. The left ventricle has no regional wall motion abnormalities. Left ventricular diastolic parameters were normal.  2. Right ventricular systolic function is normal. The right ventricular size is normal. Tricuspid regurgitation signal is inadequate for assessing  PA pressure.  3. Left atrial size was mildly dilated.  4. No significant change from 08/24/2017.  Abdominal Aortic Duplex 01/28/2020:  Mild heterogeneous plaque is noted in the mid aorta. No AAA observed.  Normal iliac artery velocity and no e/o hemodynamically significant stenosis.   EKG:  EKG 09/22/2020:  Normal sinus rhythm with rate  of 68 bpm, left atrial enlargement, normal axis.  No evidence of ischemia.   01/12/2020: Normal sinus rhythm at rate of 67 bpm, normal axis.  No evidence of ischemia, normal EKG.    Assessment    1. Encounter for counseling for tobacco use disorder   2. Primary hypertension   3. Coronary artery calcification seen on CAT scan   4. Tobacco use disorder       CHA2DS2-VASc Score is 3.  Yearly risk of stroke: 3.2% (A, HTN, vasc dz).  Score of 1=0.6; 2=2.2; 3=3.2; 4=4.8; 5=7.2; 6=9.8; 7=>9.8) -(CHF; HTN; vasc disease DM,  Male = 1; Age <65 =0; 65-74 = 1,  >75 =2; stroke/embolism= 2).   Meds ordered this encounter  Medications  . nicotine (NICODERM CQ - DOSED IN MG/24 HOURS) 21 mg/24hr patch    Sig: Place 1 patch (21 mg total) onto the skin daily.    Dispense:  28 patch    Refill:  0  . nicotine (NICODERM CQ) 14 mg/24hr patch    Sig: Place 1 patch (14 mg total) onto the skin daily. Start after you use 21 mg dose in 4 weeks    Dispense:  28 patch    Refill:  0    Dispense after 21 mg dose used  . nicotine (NICODERM CQ) 7 mg/24hr patch    Sig: Place 1 patch (7 mg total) onto the skin daily. Start after you use 14 mg dose in 8 weeks    Dispense:  28 patch    Refill:  0    Dispense after 14 mg dose used  . buPROPion (WELLBUTRIN SR) 150 MG 12 hr tablet    Sig: Take 1 tablet (150 mg total) by mouth 2 (two) times daily at 10 am and 4 pm. Start one tab a day for 3 days in morning    Dispense:  60 tablet    Refill:  3    Medications Discontinued During This Encounter  Medication Reason  . methylPREDNISolone sodium succinate (SOLU-MEDROL) 1,000 mg in sodium chloride 0.9 % 100 mL IVPB Completed Course    No orders of the defined types were placed in this encounter.     Recommendations:   Richard Lynch  is a 70 y.o. hypertension, hyperlipidemia, obstructive sleep apnea on CPAP follows Dr. Maxwell Caul, medically managed mild PAD, paroxysmal atrial fibrillation with RVR on 12/17/2019  presenting to the ED SP cardioversion, chronic inflammatory demyelinating polyneuropathy (CIDP) and received immunotherapy and also steroid therapy in the past, presents here for follow-up of hypertension  Since increasing the dose of chlorthalidone to 25 mg daily, blood pressure is now well controlled.  With regard to CIDP, patient is extremely frustrated.  He has an appointment to see Dr. Posey Pronto, neurology soon.  Advised him that he can supplement B1 and B6 along with B12 that he is already taking to see if would also help him.  Smoking cessation again discussed with the patient.  I discussed with him the long-term side effects of smoking including a component of chronic inflammatory diseases in general.  He now appears to be motivated for smoking cessation.  I prescribed him Wellbutrin 150 mg twice daily, also prescribed him nicotine patches.  No.  I spent additional 30 minutes discussing various side effects of medications and also tobacco.  Would like to see him back in 6 weeks to reinforce and counsel for tobacco use.  Patient has significant cardiovascular risks that includes coronary  calcification, hyperlipidemia, paroxysmal atrial fibrillation all adding to his comorbidity.     Adrian Prows, MD, [facility] 11/03/2020, 2:16 PM Office: [phone] Pager: [phone]   CC: [name], MD (Neuro)

## 2020-11-10 ENCOUNTER — Ambulatory Visit (HOSPITAL_COMMUNITY): Payer: Medicare Other

## 2020-11-17 DIAGNOSIS — I10 Essential (primary) hypertension: Secondary | ICD-10-CM | POA: Diagnosis not present

## 2020-11-17 DIAGNOSIS — E7800 Pure hypercholesterolemia, unspecified: Secondary | ICD-10-CM | POA: Diagnosis not present

## 2020-11-17 DIAGNOSIS — K219 Gastro-esophageal reflux disease without esophagitis: Secondary | ICD-10-CM | POA: Diagnosis not present

## 2020-11-17 DIAGNOSIS — J449 Chronic obstructive pulmonary disease, unspecified: Secondary | ICD-10-CM | POA: Diagnosis not present

## 2020-11-17 DIAGNOSIS — I480 Paroxysmal atrial fibrillation: Secondary | ICD-10-CM | POA: Diagnosis not present

## 2020-11-17 DIAGNOSIS — E782 Mixed hyperlipidemia: Secondary | ICD-10-CM | POA: Diagnosis not present

## 2020-11-17 DIAGNOSIS — J439 Emphysema, unspecified: Secondary | ICD-10-CM | POA: Diagnosis not present

## 2020-11-30 ENCOUNTER — Other Ambulatory Visit: Payer: Self-pay | Admitting: Cardiology

## 2020-11-30 DIAGNOSIS — F17200 Nicotine dependence, unspecified, uncomplicated: Secondary | ICD-10-CM

## 2020-11-30 DIAGNOSIS — Z716 Tobacco abuse counseling: Secondary | ICD-10-CM

## 2020-12-16 ENCOUNTER — Ambulatory Visit: Payer: Medicare Other | Admitting: Cardiology

## 2020-12-17 ENCOUNTER — Other Ambulatory Visit: Payer: Self-pay | Admitting: Cardiology

## 2020-12-17 DIAGNOSIS — F17200 Nicotine dependence, unspecified, uncomplicated: Secondary | ICD-10-CM

## 2020-12-17 DIAGNOSIS — Z716 Tobacco abuse counseling: Secondary | ICD-10-CM

## 2020-12-20 DIAGNOSIS — E782 Mixed hyperlipidemia: Secondary | ICD-10-CM | POA: Diagnosis not present

## 2020-12-20 DIAGNOSIS — I480 Paroxysmal atrial fibrillation: Secondary | ICD-10-CM | POA: Diagnosis not present

## 2020-12-20 DIAGNOSIS — J449 Chronic obstructive pulmonary disease, unspecified: Secondary | ICD-10-CM | POA: Diagnosis not present

## 2020-12-20 DIAGNOSIS — E7800 Pure hypercholesterolemia, unspecified: Secondary | ICD-10-CM | POA: Diagnosis not present

## 2020-12-20 DIAGNOSIS — J40 Bronchitis, not specified as acute or chronic: Secondary | ICD-10-CM | POA: Diagnosis not present

## 2020-12-20 DIAGNOSIS — J439 Emphysema, unspecified: Secondary | ICD-10-CM | POA: Diagnosis not present

## 2020-12-20 DIAGNOSIS — K219 Gastro-esophageal reflux disease without esophagitis: Secondary | ICD-10-CM | POA: Diagnosis not present

## 2020-12-20 DIAGNOSIS — I10 Essential (primary) hypertension: Secondary | ICD-10-CM | POA: Diagnosis not present

## 2020-12-21 DIAGNOSIS — Z1389 Encounter for screening for other disorder: Secondary | ICD-10-CM | POA: Diagnosis not present

## 2020-12-21 DIAGNOSIS — J40 Bronchitis, not specified as acute or chronic: Secondary | ICD-10-CM | POA: Diagnosis not present

## 2020-12-21 DIAGNOSIS — E782 Mixed hyperlipidemia: Secondary | ICD-10-CM | POA: Diagnosis not present

## 2020-12-21 DIAGNOSIS — Z0000 Encounter for general adult medical examination without abnormal findings: Secondary | ICD-10-CM | POA: Diagnosis not present

## 2020-12-21 DIAGNOSIS — I480 Paroxysmal atrial fibrillation: Secondary | ICD-10-CM | POA: Diagnosis not present

## 2020-12-21 DIAGNOSIS — I739 Peripheral vascular disease, unspecified: Secondary | ICD-10-CM | POA: Diagnosis not present

## 2020-12-21 DIAGNOSIS — M130 Polyarthritis, unspecified: Secondary | ICD-10-CM | POA: Diagnosis not present

## 2020-12-21 DIAGNOSIS — G6181 Chronic inflammatory demyelinating polyneuritis: Secondary | ICD-10-CM | POA: Diagnosis not present

## 2020-12-21 DIAGNOSIS — G4733 Obstructive sleep apnea (adult) (pediatric): Secondary | ICD-10-CM | POA: Diagnosis not present

## 2020-12-21 DIAGNOSIS — G4719 Other hypersomnia: Secondary | ICD-10-CM | POA: Diagnosis not present

## 2020-12-21 DIAGNOSIS — Z23 Encounter for immunization: Secondary | ICD-10-CM | POA: Diagnosis not present

## 2020-12-21 DIAGNOSIS — I10 Essential (primary) hypertension: Secondary | ICD-10-CM | POA: Diagnosis not present

## 2020-12-23 ENCOUNTER — Other Ambulatory Visit: Payer: Self-pay

## 2020-12-23 ENCOUNTER — Ambulatory Visit (INDEPENDENT_AMBULATORY_CARE_PROVIDER_SITE_OTHER): Payer: Medicare Other | Admitting: Neurology

## 2020-12-23 ENCOUNTER — Encounter: Payer: Self-pay | Admitting: Neurology

## 2020-12-23 VITALS — BP 129/68 | HR 83 | Ht 70.0 in | Wt 187.8 lb

## 2020-12-23 DIAGNOSIS — G629 Polyneuropathy, unspecified: Secondary | ICD-10-CM

## 2020-12-23 DIAGNOSIS — I2510 Atherosclerotic heart disease of native coronary artery without angina pectoris: Secondary | ICD-10-CM | POA: Diagnosis not present

## 2020-12-23 MED ORDER — GABAPENTIN 300 MG PO CAPS
ORAL_CAPSULE | ORAL | 3 refills | Status: DC
Start: 2020-12-23 — End: 2021-06-30

## 2020-12-23 NOTE — Progress Notes (Signed)
Follow-up Visit   Date: 12/23/20   Richard Lynch MRN: [id number] DOB: Apr 24, 1951   Interim History: Richard Lynch is a 70 y.o. left-handed Caucasian male with hypertension, hyperlipidemia, and tobacco use returning to the clinic for follow-up of CIDP.  The patient was accompanied to the clinic by self.  He was evaluated at [facility] who repeated NCS/EMG and recommended tapering off IVIG due to unlikely ongoing benefit.  With his last IVIG, he developed generalized itching and since December, he remains off IVIG.  He has not noticed any significant benefit or worsening with hand strength.  Despite his hand weakness, he remains active with various household projects.   He is taking gabapentin $RemoveBeforeDEI'600mg'[id number]$  at bedtime which helps his sleep and controls the sharp shooting pain in the hands.  However, he has noticed mild lightheadedness in the morning upon awakening.    Medications:  Current Outpatient Medications on File Prior to Visit  Medication Sig Dispense Refill  . amphetamine-dextroamphetamine (ADDERALL) 20 MG tablet Take 20-30 mg by mouth See admin instructions. Take 1 tablet every morning then take 1 and 1/2 tablets in the afternoon if needed    . apixaban (ELIQUIS) 5 MG TABS tablet Take 1 tablet (5 mg total) by mouth 2 (two) times daily. 180 tablet 3  . Ascorbic Acid (VITAMIN C) 100 MG tablet Take 100 mg by mouth daily.    Marland Kitchen atenolol (TENORMIN) 25 MG tablet Take 25 mg by mouth daily.     Marland Kitchen atorvastatin (LIPITOR) 40 MG tablet Take 40 mg by mouth daily.    . candesartan (ATACAND) 32 MG tablet Take 32 mg by mouth daily.     . celecoxib (CELEBREX) 200 MG capsule 1 tab po q day with food for pain and  swelling (Patient taking differently: Take 200 mg by mouth daily.) 30 capsule 0  . chlorthalidone (HYGROTON) 25 MG tablet Take 1 tablet (25 mg total) by mouth daily. Take 1/2 tablet daily. 90 tablet 3  . Coenzyme Q10 (COQ-10 PO) Take 300 mg by mouth daily.    Marland Kitchen diltiazem (CARDIZEM) 30 MG  tablet Take 1 tablet every 4 hours AS NEEDED for AFIB heart rate >100 45 tablet 1  . gabapentin (NEURONTIN) 300 MG capsule Take 2 capsules (600 mg total) by mouth at bedtime. 60 capsule 5  . Multiple Vitamin (MULTIVITAMIN WITH MINERALS) TABS tablet Take 1 tablet by mouth daily.    . nicotine (NICODERM CQ) 7 mg/24hr patch Place 1 patch (7 mg total) onto the skin daily. Start after you use 14 mg dose in 8 weeks 28 patch 0  . pyridoxine (B-6) 100 MG tablet Take 100 mg by mouth daily.    Marland Kitchen thiamine 100 MG tablet Take 100 mg by mouth daily.    . Turmeric (QC TUMERIC COMPLEX PO) Take 1,500 mg by mouth daily.    . valACYclovir (VALTREX) 500 MG tablet Take 500 mg by mouth daily.    . vitamin B-12 (CYANOCOBALAMIN) 1000 MCG tablet Take 1,000 mcg by mouth daily.    Marland Kitchen buPROPion (WELLBUTRIN SR) 150 MG 12 hr tablet TAKE 1 TABLET BY MOUTH 2 TIMES DAILY AT 10 AM AND 4 PM. START ONE TAB A DAY FOR 3 DAYS IN MORNING (Patient not taking: Reported on 12/23/2020) 180 tablet 2  . nicotine (NICODERM CQ - DOSED IN MG/24 HOURS) 21 mg/24hr patch PLACE 1 PATCH ONTO THE SKIN DAILY. (Patient not taking: Reported on 12/23/2020) 28 patch 0  . nicotine (NICODERM CQ)  14 mg/24hr patch Place 1 patch (14 mg total) onto the skin daily. Start after you use 21 mg dose in 4 weeks (Patient not taking: Reported on 12/23/2020) 28 patch 0   No current facility-administered medications on file prior to visit.    Allergies: No Known Allergies   Vital Signs:  BP 129/68   Pulse 83   Ht $R'5\' 10"'LP$  (1.778 m)   Wt 187 lb 12.8 oz (85.2 kg)   SpO2 97%   BMI 26.95 kg/m     Neurological Exam: MENTAL STATUS including orientation to time, place, person, recent and remote memory, attention span and concentration, language, and fund of knowledge is normal.  Speech is not dysarthric.  CRANIAL NERVES: Face is symmetric.  No ptosis.  MOTOR: Marked atrophy of the left intrinsic hand muscles and forearm. Left claw hand deformity. No  fasciculations or  abnormal movements.    Upper Extremity:  Right  Left  Deltoid  5/5   5/5   Biceps  5/5   5/5   Triceps  5/5   5/5   Infraspinatus 5/5  5/5  Medial pectoralis 5/5  5/5  Wrist extensors  5/5   5/5   Wrist flexors  5/5   5/5   Finger extensors  5/5   2+/5   Finger flexors  5/5   5-/5   Dorsal interossei  5/5   3+/5   Abductor pollicis  5/5   1-/5   Tone (Ashworth scale)  0  0   Lower Extremity:  Right  Left  Hip flexors  5/5   5/5   Hip extensors  5/5   5/5   Adductor 5/5  5/5  Abductor 5/5  5/5  Knee flexors  5/5   5/5   Knee extensors  5/5   5/5   Dorsiflexors  5/5   5/5   Plantarflexors  5/5   5/5   Toe extensors  5/5   5/5   Toe flexors  5/5   5/5   Tone (Ashworth scale)  0  0    MSRs:                                           Right        Left brachioradialis 2+  2+  biceps 2+  2+  triceps 2+  2+  patellar 1+  2+  ankle jerk 0  0   SENSORY:  Vibration is reduced to 50% at the ankles, intact at the knees and MCP bilaterally. Temperature remains reduced over the palms/fingers.  COORDINATION/GAIT: Gait narrow based and stable.   Data: DATA: NCS/EMG of the legs 04/17/2019: The electrophysiologic findings are consistent with a sensorimotor polyneuropathy, demyelinating and axonal loss in type affecting the lower extremities.  The presence of sural-sparing suggests findings may be associated with a polyradiculoneuropathy, correlate clinically.  NCS/EMG of the arms 04/01/2019 This is a complex study of the upper extremities.  Findings are as follows:  1. Bilateral median neuropathy at or distal to the wrist (severe), consistent with a clinical diagnosis of carpal tunnel syndrome.   2. Bilateral ulnar neuropathy with slowing across the elbow, mild on the right and severe on the left. 3. Chronic left posterior interosseous neuropathy, very severe.  4. Chronic C7-8 radiculopathy affecting bilateral upper extremities, moderate in degree electrically.  MRI cervical spine wo  contrast 07/04/2018: 1.  C5-T3 posterior-lateral fusion with decompressive laminectomy from C5-6 to T1-2. There is no arthrodesis by CT and the C5 and C6 lateral mass screws are loose. 2. Disc and facet degeneration causes multilevel foraminal impingement. Greatest foraminal impingement is seen on the left at C2-3, bilaterally at C3-4, right at C5-6, and right at T1-2. 3. Widely patent canal at levels of laminectomy. Noncompressive spinal stenosis seen at C3-4 and C4-5. 4. Septated fluid collection within the lower laminectomy defect without dural mass effect, best attributed to seroma.  MRI thoracic spine wo contrast 07/05/2018: Cervical and thoracic fusion and laminectomy. 15 mm fluid collection in the laminectomy bed. No cord compression or cord signal abnormality. Left paracentral disc protrusion T7-8 unchanged from the prior MRI.  CSF testing 05/08/2019:   W0 R1 G73 P105*, 2 oligoclonal bands, normal IgG index, MBP and ACE  Labs 04/17/2019: ESR 6, vitamin B1 28, TSH 1.26, copper 106, SPEP with IFE no M protein, vitamin B12 >2000, folate 19.6   IMPRESSION: Complex case given overlapping radiculopathy, entrapment neuropathy, and polyneuropathy.  His neurological exam has been relatively unchanged despite being on and off IVIG.  At this point, I discussed that there is no benefit of IVIG and will remain off this.  It is very likely that his current neurological status will be his new baseline and management will be supportive.  For pain, increase gabapentin to $RemoveBefor'300mg'[id number]$  in the morning and $RemoveBef'600mg'bwlxfQinkm$  at bedtime.   Return to clinic in 6 months     Thank you for allowing me to participate in patient's care.  If I can answer any additional questions, I would be pleased to do so.    Sincerely,    Annsleigh Dragoo K. Posey Pronto, DO

## 2020-12-23 NOTE — Patient Instructions (Signed)
Increase gabapentin to 300mg  in the morning and 2 tablet daily  Return to clinic in 6 months

## 2020-12-27 ENCOUNTER — Ambulatory Visit: Payer: Medicare Other | Admitting: Neurology

## 2020-12-27 DIAGNOSIS — Z800 Family history of malignant neoplasm of digestive organs: Secondary | ICD-10-CM | POA: Diagnosis not present

## 2020-12-27 DIAGNOSIS — I480 Paroxysmal atrial fibrillation: Secondary | ICD-10-CM | POA: Diagnosis not present

## 2020-12-27 DIAGNOSIS — Z86010 Personal history of colonic polyps: Secondary | ICD-10-CM | POA: Diagnosis not present

## 2020-12-28 ENCOUNTER — Encounter: Payer: Self-pay | Admitting: Cardiology

## 2021-01-13 ENCOUNTER — Ambulatory Visit: Payer: Medicare Other | Admitting: Student

## 2021-01-16 ENCOUNTER — Other Ambulatory Visit: Payer: Self-pay | Admitting: Cardiology

## 2021-01-16 DIAGNOSIS — I480 Paroxysmal atrial fibrillation: Secondary | ICD-10-CM

## 2021-01-20 DIAGNOSIS — J40 Bronchitis, not specified as acute or chronic: Secondary | ICD-10-CM | POA: Diagnosis not present

## 2021-01-20 DIAGNOSIS — J449 Chronic obstructive pulmonary disease, unspecified: Secondary | ICD-10-CM | POA: Diagnosis not present

## 2021-01-20 DIAGNOSIS — E782 Mixed hyperlipidemia: Secondary | ICD-10-CM | POA: Diagnosis not present

## 2021-01-20 DIAGNOSIS — E7800 Pure hypercholesterolemia, unspecified: Secondary | ICD-10-CM | POA: Diagnosis not present

## 2021-01-20 DIAGNOSIS — K219 Gastro-esophageal reflux disease without esophagitis: Secondary | ICD-10-CM | POA: Diagnosis not present

## 2021-01-20 DIAGNOSIS — I480 Paroxysmal atrial fibrillation: Secondary | ICD-10-CM | POA: Diagnosis not present

## 2021-01-20 DIAGNOSIS — J439 Emphysema, unspecified: Secondary | ICD-10-CM | POA: Diagnosis not present

## 2021-01-20 DIAGNOSIS — I10 Essential (primary) hypertension: Secondary | ICD-10-CM | POA: Diagnosis not present

## 2021-01-24 ENCOUNTER — Ambulatory Visit: Payer: Medicare Other | Admitting: Student

## 2021-02-03 DIAGNOSIS — K621 Rectal polyp: Secondary | ICD-10-CM | POA: Diagnosis not present

## 2021-02-03 DIAGNOSIS — Z86010 Personal history of colonic polyps: Secondary | ICD-10-CM | POA: Diagnosis not present

## 2021-02-03 DIAGNOSIS — Z800 Family history of malignant neoplasm of digestive organs: Secondary | ICD-10-CM | POA: Diagnosis not present

## 2021-02-03 DIAGNOSIS — K635 Polyp of colon: Secondary | ICD-10-CM | POA: Diagnosis not present

## 2021-02-10 DIAGNOSIS — K621 Rectal polyp: Secondary | ICD-10-CM | POA: Diagnosis not present

## 2021-02-10 DIAGNOSIS — K635 Polyp of colon: Secondary | ICD-10-CM | POA: Diagnosis not present

## 2021-03-11 DIAGNOSIS — J439 Emphysema, unspecified: Secondary | ICD-10-CM | POA: Diagnosis not present

## 2021-03-11 DIAGNOSIS — K219 Gastro-esophageal reflux disease without esophagitis: Secondary | ICD-10-CM | POA: Diagnosis not present

## 2021-03-11 DIAGNOSIS — I10 Essential (primary) hypertension: Secondary | ICD-10-CM | POA: Diagnosis not present

## 2021-03-11 DIAGNOSIS — I480 Paroxysmal atrial fibrillation: Secondary | ICD-10-CM | POA: Diagnosis not present

## 2021-03-11 DIAGNOSIS — E7800 Pure hypercholesterolemia, unspecified: Secondary | ICD-10-CM | POA: Diagnosis not present

## 2021-03-11 DIAGNOSIS — E782 Mixed hyperlipidemia: Secondary | ICD-10-CM | POA: Diagnosis not present

## 2021-03-11 DIAGNOSIS — J40 Bronchitis, not specified as acute or chronic: Secondary | ICD-10-CM | POA: Diagnosis not present

## 2021-03-11 DIAGNOSIS — J449 Chronic obstructive pulmonary disease, unspecified: Secondary | ICD-10-CM | POA: Diagnosis not present

## 2021-03-30 ENCOUNTER — Emergency Department (HOSPITAL_BASED_OUTPATIENT_CLINIC_OR_DEPARTMENT_OTHER)
Admission: EM | Admit: 2021-03-30 | Discharge: 2021-03-30 | Disposition: A | Discharge status: Home / Self Care | Payer: Medicare Other | Attending: Emergency Medicine | Admitting: Emergency Medicine

## 2021-03-30 ENCOUNTER — Emergency Department (HOSPITAL_BASED_OUTPATIENT_CLINIC_OR_DEPARTMENT_OTHER): Payer: Medicare Other

## 2021-03-30 ENCOUNTER — Other Ambulatory Visit: Payer: Self-pay

## 2021-03-30 ENCOUNTER — Encounter (HOSPITAL_BASED_OUTPATIENT_CLINIC_OR_DEPARTMENT_OTHER): Payer: Self-pay | Admitting: *Deleted

## 2021-03-30 DIAGNOSIS — S86112A Strain of other muscle(s) and tendon(s) of posterior muscle group at lower leg level, left leg, initial encounter: Secondary | ICD-10-CM | POA: Diagnosis not present

## 2021-03-30 DIAGNOSIS — Z7901 Long term (current) use of anticoagulants: Secondary | ICD-10-CM | POA: Insufficient documentation

## 2021-03-30 DIAGNOSIS — I10 Essential (primary) hypertension: Secondary | ICD-10-CM | POA: Insufficient documentation

## 2021-03-30 DIAGNOSIS — Y9353 Activity, golf: Secondary | ICD-10-CM | POA: Diagnosis not present

## 2021-03-30 DIAGNOSIS — S86812A Strain of other muscle(s) and tendon(s) at lower leg level, left leg, initial encounter: Secondary | ICD-10-CM

## 2021-03-30 DIAGNOSIS — Z96649 Presence of unspecified artificial hip joint: Secondary | ICD-10-CM | POA: Diagnosis not present

## 2021-03-30 DIAGNOSIS — F17210 Nicotine dependence, cigarettes, uncomplicated: Secondary | ICD-10-CM | POA: Insufficient documentation

## 2021-03-30 DIAGNOSIS — Z79899 Other long term (current) drug therapy: Secondary | ICD-10-CM | POA: Diagnosis not present

## 2021-03-30 DIAGNOSIS — M25462 Effusion, left knee: Secondary | ICD-10-CM | POA: Diagnosis not present

## 2021-03-30 DIAGNOSIS — X58XXXA Exposure to other specified factors, initial encounter: Secondary | ICD-10-CM | POA: Diagnosis not present

## 2021-03-30 DIAGNOSIS — Z96651 Presence of right artificial knee joint: Secondary | ICD-10-CM | POA: Insufficient documentation

## 2021-03-30 DIAGNOSIS — I4891 Unspecified atrial fibrillation: Secondary | ICD-10-CM | POA: Insufficient documentation

## 2021-03-30 DIAGNOSIS — S86912A Strain of unspecified muscle(s) and tendon(s) at lower leg level, left leg, initial encounter: Secondary | ICD-10-CM | POA: Diagnosis not present

## 2021-03-30 DIAGNOSIS — M79662 Pain in left lower leg: Secondary | ICD-10-CM | POA: Diagnosis not present

## 2021-03-30 DIAGNOSIS — S8992XA Unspecified injury of left lower leg, initial encounter: Secondary | ICD-10-CM | POA: Diagnosis present

## 2021-03-30 HISTORY — DX: Unspecified atrial fibrillation: I48.91

## 2021-03-30 MED ORDER — DICLOFENAC SODIUM 1 % EX GEL: 4.0000 g | Freq: Four times a day (QID) | CUTANEOUS | 0 refills | Status: DC

## 2021-03-30 MED ORDER — OXYCODONE HCL 5 MG PO TABS
5.0000 mg | ORAL_TABLET | Freq: Once | ORAL | Status: AC
  Administered 2021-03-30: 19:00:00 5 mg via ORAL
  Filled 2021-03-30: qty 1

## 2021-03-30 MED ORDER — ACETAMINOPHEN 500 MG PO TABS
1000.0000 mg | ORAL_TABLET | Freq: Once | ORAL | Status: AC
  Administered 2021-03-30: 19:00:00 1000 mg via ORAL
  Filled 2021-03-30: qty 2

## 2021-03-30 MED ORDER — KETOROLAC TROMETHAMINE 60 MG/2ML IM SOLN
15.0000 mg | Freq: Once | INTRAMUSCULAR | Status: AC
  Administered 2021-03-30: 19:00:00 15 mg via INTRAMUSCULAR
  Filled 2021-03-30: qty 2

## 2021-03-30 NOTE — ED Triage Notes (Addendum)
C/o left calf pain x 4 hrs denies injury , pt is on Eliquis

## 2021-03-30 NOTE — ED Notes (Signed)
Pt. To ultrasound.

## 2021-03-30 NOTE — Discharge Instructions (Signed)
Take 4 over the counter ibuprofen tablets 3 times a day or 2 over-the-counter naproxen tablets twice a day for pain. Also take tylenol 1000mg(2 extra strength) four times a day.    

## 2021-03-30 NOTE — ED Provider Notes (Addendum)
[facility] EMERGENCY DEPARTMENT Provider Note   CSN: [id number] Arrival date & time: 03/30/21  1804     History Chief Complaint  Patient presents with  . Leg Pain    Richard Lynch is a 70 y.o. male.  70 yo M with a chief complaints of left calf pain.  The patient was playing golf today and started having a tightness in his calf that got worse throughout the round.  He had apparently called off because the pain and gotten so bad.  This started suddenly today.  Prior to going out to play golf he felt a mild twinge and before that had had no symptoms.  He denies trauma to the area.  He feels like it swollen.  Denies fevers.  Denies breaks in the skin.  The history is provided by the patient and the spouse.  Leg Pain Location:  Leg Time since incident:  4 hours Injury: no   Leg location:  L lower leg Pain details:    Quality:  Aching   Radiates to:  Does not radiate   Severity:  Moderate   Onset quality:  Gradual   Duration:  4 hours   Timing:  Constant   Progression:  Worsening Chronicity:  New Prior injury to area:  No Relieved by:  Nothing Worsened by:  Activity, abduction and bearing weight Ineffective treatments:  None tried Associated symptoms: no fever        Past Medical History:  Diagnosis Date  . Atrial fibrillation ([place])   . Constipation due to opioid therapy 12/22/2015  . Emphysema lung ([place])   . Gait abnormality 02/20/2017  . GERD (gastroesophageal reflux disease)   . High cholesterol   . Hypertension   . OSA (obstructive sleep apnea)    on CPAP  . Primary localized osteoarthritis of right knee 12/08/2015  . Septic olecranon bursitis 06/2015   MSSA    Patient Active Problem List   Diagnosis Date Noted  . Paroxysmal atrial fibrillation ([place]) 12/24/2019  . Secondary hypercoagulable state ([place]) 12/24/2019  . Dupuytren's contracture 07/02/2018  . Spinal stenosis in cervical region 12/17/2017  . Carpal tunnel syndrome of left wrist 11/27/2017   . Radial nerve palsy, left 11/27/2017  . Trigger ring finger of left hand 11/27/2017  . Gait abnormality 02/20/2017  . Restless leg syndrome 07/05/2016  . Constipation due to opioid therapy 12/22/2015  . Primary localized osteoarthritis of right knee 12/08/2015  . Bullous emphysema ([place]) 11/02/2015  . Essential hypertension 11/02/2015  . Arthritis 11/02/2015  . Hyperlipidemia 11/02/2015  . OSA on CPAP 11/02/2015  . GERD (gastroesophageal reflux disease) 11/02/2015  . Septic olecranon bursitis 06/24/2015  . Blepharochalasis 12/29/2014  . Hidrocystoma of left eyelid 12/29/2014  . Myogenic ptosis of eyelid of both eyes 12/29/2014  . Multiple lung nodules on CT 06/30/2014  . Combined senile cataract 12/24/2012  . 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 Myopia with astigmatism and presbyopia 12/24/2012  . Neurotrophic ulcer ([place]) 12/24/2012    Past Surgical History:  Procedure Laterality Date  . COLONOSCOPY W/ BIOPSIES AND POLYPECTOMY    . ELBOW SURGERY  08/2015   bilateral  . EYE SURGERY     'repair of tear to left eye from pliers'  . HAND SURGERY Left 08/2018   Carpal Tunnel Surgery- [facility]  . HERNIA REPAIR  07/2010  . KNEE SURGERY     3 times on both knee's each  . MULTIPLE TOOTH EXTRACTIONS    . SHOULDER ARTHROSCOPY    .  TOTAL HIP ARTHROPLASTY  08/2010  . TOTAL KNEE ARTHROPLASTY Right 12/20/2015  . TOTAL KNEE ARTHROPLASTY Right 12/20/2015   Procedure: TOTAL KNEE ARTHROPLASTY;  Surgeon: [name], MD;  Location: [facility];  Service: Orthopedics;  Laterality: Right;       Family History  Problem Relation Age of Onset  . Emphysema Father   . Heart disease Father   . Heart disease Mother   . Colon cancer Mother   . Breast cancer Mother     Social History   Tobacco Use  . Smoking status: Current Every Day Smoker    Packs/day: 1.00    Types: Cigarettes    Start date: 01/24/1973  . Smokeless tobacco: Never Used  . Tobacco comment: 1 pack daily  Vaping Use  .  Vaping Use: Former  . Substances: Nicotine  Substance Use Topics  . Alcohol use: Yes    Alcohol/week: 4.0 standard drinks    Types: 2 Cans of beer, 2 Standard drinks or equivalent per week    Comment: 1-2 drinks per night  . Drug use: No    Home Medications Prior to Admission medications   Medication Sig Start Date End Date Taking? Authorizing Provider  amphetamine-dextroamphetamine (ADDERALL) 20 MG tablet Take 20-30 mg by mouth See admin instructions. Take 1 tablet every morning then take 1 and 1/2 tablets in the afternoon if needed    [provider]  apixaban (ELIQUIS) 5 MG TABS tablet 1 tablet    [provider]  Ascorbic Acid (VITAMIN C) 100 MG tablet Take 100 mg by mouth daily.    [provider]  atenolol (TENORMIN) 25 MG tablet Take 25 mg by mouth daily.  10/16/15   [provider]  atorvastatin (LIPITOR) 40 MG tablet Take 40 mg by mouth daily. 03/07/19   [provider]  buPROPion (WELLBUTRIN SR) 150 MG 12 hr tablet TAKE 1 TABLET BY MOUTH 2 TIMES DAILY AT 10 AM AND 4 PM. START ONE TAB A DAY FOR 3 DAYS IN MORNING Patient not taking: Reported on 12/23/2020 11/30/20   Adrian Prows, MD  candesartan (ATACAND) 32 MG tablet Take 32 mg by mouth daily.  10/16/15   [provider]  celecoxib (CELEBREX) 200 MG capsule 1 tab po q day with food for pain and  swelling Patient taking differently: Take 200 mg by mouth daily. 12/23/15   Shepperson, Kirstin, PA-C  chlorthalidone (HYGROTON) 25 MG tablet Take 1 tablet (25 mg total) by mouth daily. Take 1/2 tablet daily. 09/22/20   Adrian Prows, MD  Coenzyme Q10 (COQ-10 PO) Take 300 mg by mouth daily.    [provider]  diltiazem (CARDIZEM) 30 MG tablet Take 1 tablet every 4 hours AS NEEDED for AFIB heart rate >100 12/24/19   Fenton, Clint R, PA  ELIQUIS 5 MG TABS tablet TAKE 1 TABLET BY MOUTH TWICE A DAY 01/17/21   Adrian Prows, MD  gabapentin (NEURONTIN) 300 MG capsule Take 1 tablet in the morning and 2  tablets at bedtime. 12/23/20   [name] K, DO  Multiple Vitamin (MULTIVITAMIN WITH MINERALS) TABS tablet Take 1 tablet by mouth daily.    [provider]  nicotine (NICODERM CQ - DOSED IN MG/24 HOURS) 21 mg/24hr patch PLACE 1 PATCH ONTO THE SKIN DAILY. Patient not taking: Reported on 12/23/2020 12/17/20   Adrian Prows, MD  nicotine (NICODERM CQ) 14 mg/24hr patch Place 1 patch (14 mg total) onto the skin daily. Start after you use 21 mg dose in  4 weeks Patient not taking: Reported on 12/23/2020 11/03/20   Adrian Prows, MD  nicotine (NICODERM CQ) 7 mg/24hr patch Place 1 patch (7 mg total) onto the skin daily. Start after you use 14 mg dose in 8 weeks 11/03/20   Adrian Prows, MD  pyridoxine (B-6) 100 MG tablet Take 100 mg by mouth daily.    [provider]  thiamine 100 MG tablet Take 100 mg by mouth daily.    [provider]  Turmeric (QC TUMERIC COMPLEX PO) Take 1,500 mg by mouth daily.    [provider]  valACYclovir (VALTREX) 500 MG tablet Take 500 mg by mouth daily. 07/15/20   [provider]  vitamin B-12 (CYANOCOBALAMIN) 1000 MCG tablet Take 1,000 mcg by mouth daily.    [provider]    Allergies    Patient has no known allergies.  Review of Systems   Review of Systems  Constitutional: Negative for chills and fever.  HENT: Negative for congestion and facial swelling.   Eyes: Negative for discharge and visual disturbance.  Respiratory: Negative for shortness of breath.   Cardiovascular: Negative for chest pain and palpitations.  Gastrointestinal: Negative for abdominal pain, diarrhea and vomiting.  Musculoskeletal: Positive for myalgias. Negative for arthralgias.  Skin: Negative for color change and rash.  Neurological: Negative for tremors, syncope and headaches.  Psychiatric/Behavioral: Negative for confusion and dysphoric mood.    Physical Exam Updated Vital Signs BP (!) 179/94 (BP Location: Left Arm)   Pulse 85   Temp 98.5 F  (36.9 C) (Oral)   Resp 18   Ht 5' 10.5" (1.791 m)   Wt 84.4 kg   SpO2 99%   BMI 26.31 kg/m   Physical Exam Vitals and nursing note reviewed.  Constitutional:      Appearance: He is well-developed.  HENT:     Head: Normocephalic and atraumatic.  Eyes:     Pupils: Pupils are equal, round, and reactive to light.  Neck:     Vascular: No JVD.  Cardiovascular:     Rate and Rhythm: Normal rate and regular rhythm.     Heart sounds: No murmur heard. No friction rub. No gallop.   Pulmonary:     Effort: No respiratory distress.     Breath sounds: No wheezing.  Abdominal:     General: There is no distension.     Tenderness: There is no guarding or rebound.  Musculoskeletal:        General: Tenderness present. Normal range of motion.     Cervical back: Normal range of motion and neck supple.     Comments: Tenderness about the calf muscle.  No obvious bony tenderness or crepitus.  No appreciable edema.  Pulse motor and sensation intact distally.  Skin:    Coloration: Skin is not pale.     Findings: No rash.  Neurological:     Mental Status: He is alert and oriented to person, place, and time.  Psychiatric:        Behavior: Behavior normal.     ED Results / Procedures / Treatments   Labs (all labs ordered are listed, but only abnormal results are displayed) Labs Reviewed - No data to display  EKG None  Radiology DG Tibia/Fibula Left  Result Date: 03/30/2021 CLINICAL DATA:  Left leg pain.  Calf pain. EXAM: LEFT TIBIA AND FIBULA - 2 VIEW COMPARISON:  None. FINDINGS: Cortical margins of the tibia and fibular intact. There is no evidence of fracture or other focal  bone lesions. Moderate to advanced osteoarthritis of the knee. Knee joint effusion. Minimal osteoarthritis of the ankle. Prominent talar navicular degenerative change partially included. Soft tissues are unremarkable. IMPRESSION: 1. No acute osseous abnormality. 2. Moderate to advanced osteoarthritis of the knee with joint  effusion. Minimal osteoarthritis of the ankle. Electronically Signed   By: Keith Rake M.D.   On: 03/30/2021 18:40    Procedures Procedures   Medications Ordered in ED Medications  acetaminophen (TYLENOL) tablet 1,000 mg (1,000 mg Oral Given 03/30/21 1836)  oxyCODONE (Oxy IR/ROXICODONE) immediate release tablet 5 mg (5 mg Oral Given 03/30/21 1836)    ED Course  I have reviewed the triage vital signs and the nursing notes.  Pertinent labs & imaging results that were available during my care of the patient were reviewed by me and considered in my medical decision making (see chart for details).    MDM Rules/Calculators/A&P                          70 yo M with a cc of L calf pain.  Noticed today while playing golf.  Most likely this is a muscular strain based on history and exam the patient however feels like he is having severe pain.  I do not appreciate any edema I do not appreciate any crepitus.  I do not appreciate any subcutaneous emphysema.  There is no erythema to his leg.  Pulse motor and sensation are intact distally.  Pulses unlikely to be an acute arterial occlusion with distal pulses, seems unlikely he has a acute DVT.  Will obtain a plain film to assess for subcutaneous gas.  If negative will treat for likely strain have him immobilize the area.  PCP follow-up. Plain film viewed by me negative.  I reassessed the patient after the x-ray and they were still concerned that there may be another underlying diagnosis.  I DVT study was obtained and is negative.    Discharge home.  6:47 PM:  I have discussed the diagnosis/risks/treatment options with the patient and family and believe the pt to be eligible for discharge home to follow-up with PCP. We also discussed returning to the ED immediately if new or worsening sx occur. We discussed the sx which are most concerning (e.g., sudden worsening pain, fever, inability to tolerate by mouth) that necessitate immediate return. Medications  administered to the patient during their visit and any new prescriptions provided to the patient are listed below.  Medications given during this visit Medications  acetaminophen (TYLENOL) tablet 1,000 mg (1,000 mg Oral Given 03/30/21 1836)  oxyCODONE (Oxy IR/ROXICODONE) immediate release tablet 5 mg (5 mg Oral Given 03/30/21 1836)     The patient appears reasonably screen and/or stabilized for discharge and I doubt any other medical condition or other [facility] requiring further screening, evaluation, or treatment in the ED at this time prior to discharge.     Final Clinical Impression(s) / ED Diagnoses Final diagnoses:  Strain of calf muscle, left, initial encounter    Rx / DC Orders ED Discharge Orders    None       Deno Etienne, DO 03/30/21 [place], [place], DO 03/30/21 1955

## 2021-04-01 DIAGNOSIS — E7800 Pure hypercholesterolemia, unspecified: Secondary | ICD-10-CM | POA: Diagnosis not present

## 2021-04-01 DIAGNOSIS — I480 Paroxysmal atrial fibrillation: Secondary | ICD-10-CM | POA: Diagnosis not present

## 2021-04-01 DIAGNOSIS — M79662 Pain in left lower leg: Secondary | ICD-10-CM | POA: Diagnosis not present

## 2021-04-01 DIAGNOSIS — K219 Gastro-esophageal reflux disease without esophagitis: Secondary | ICD-10-CM | POA: Diagnosis not present

## 2021-04-01 DIAGNOSIS — S86812A Strain of other muscle(s) and tendon(s) at lower leg level, left leg, initial encounter: Secondary | ICD-10-CM | POA: Diagnosis not present

## 2021-04-01 DIAGNOSIS — J439 Emphysema, unspecified: Secondary | ICD-10-CM | POA: Diagnosis not present

## 2021-04-01 DIAGNOSIS — E782 Mixed hyperlipidemia: Secondary | ICD-10-CM | POA: Diagnosis not present

## 2021-04-01 DIAGNOSIS — I10 Essential (primary) hypertension: Secondary | ICD-10-CM | POA: Diagnosis not present

## 2021-04-01 DIAGNOSIS — J449 Chronic obstructive pulmonary disease, unspecified: Secondary | ICD-10-CM | POA: Diagnosis not present

## 2021-04-06 DIAGNOSIS — I10 Essential (primary) hypertension: Secondary | ICD-10-CM | POA: Diagnosis not present

## 2021-04-06 DIAGNOSIS — J449 Chronic obstructive pulmonary disease, unspecified: Secondary | ICD-10-CM | POA: Diagnosis not present

## 2021-04-06 DIAGNOSIS — M1712 Unilateral primary osteoarthritis, left knee: Secondary | ICD-10-CM | POA: Diagnosis not present

## 2021-04-06 DIAGNOSIS — I480 Paroxysmal atrial fibrillation: Secondary | ICD-10-CM | POA: Diagnosis not present

## 2021-04-06 DIAGNOSIS — J439 Emphysema, unspecified: Secondary | ICD-10-CM | POA: Diagnosis not present

## 2021-04-06 DIAGNOSIS — E782 Mixed hyperlipidemia: Secondary | ICD-10-CM | POA: Diagnosis not present

## 2021-04-06 DIAGNOSIS — J40 Bronchitis, not specified as acute or chronic: Secondary | ICD-10-CM | POA: Diagnosis not present

## 2021-04-06 DIAGNOSIS — K219 Gastro-esophageal reflux disease without esophagitis: Secondary | ICD-10-CM | POA: Diagnosis not present

## 2021-04-06 DIAGNOSIS — E7800 Pure hypercholesterolemia, unspecified: Secondary | ICD-10-CM | POA: Diagnosis not present

## 2021-05-02 DIAGNOSIS — M25472 Effusion, left ankle: Secondary | ICD-10-CM | POA: Diagnosis not present

## 2021-05-02 DIAGNOSIS — J449 Chronic obstructive pulmonary disease, unspecified: Secondary | ICD-10-CM | POA: Diagnosis not present

## 2021-05-02 DIAGNOSIS — K219 Gastro-esophageal reflux disease without esophagitis: Secondary | ICD-10-CM | POA: Diagnosis not present

## 2021-05-02 DIAGNOSIS — E7800 Pure hypercholesterolemia, unspecified: Secondary | ICD-10-CM | POA: Diagnosis not present

## 2021-05-02 DIAGNOSIS — E782 Mixed hyperlipidemia: Secondary | ICD-10-CM | POA: Diagnosis not present

## 2021-05-02 DIAGNOSIS — U071 COVID-19: Secondary | ICD-10-CM | POA: Diagnosis not present

## 2021-05-02 DIAGNOSIS — S86812A Strain of other muscle(s) and tendon(s) at lower leg level, left leg, initial encounter: Secondary | ICD-10-CM | POA: Diagnosis not present

## 2021-05-02 DIAGNOSIS — M1712 Unilateral primary osteoarthritis, left knee: Secondary | ICD-10-CM | POA: Diagnosis not present

## 2021-05-02 DIAGNOSIS — I480 Paroxysmal atrial fibrillation: Secondary | ICD-10-CM | POA: Diagnosis not present

## 2021-05-02 DIAGNOSIS — J439 Emphysema, unspecified: Secondary | ICD-10-CM | POA: Diagnosis not present

## 2021-05-02 DIAGNOSIS — I10 Essential (primary) hypertension: Secondary | ICD-10-CM | POA: Diagnosis not present

## 2021-05-17 DIAGNOSIS — M1712 Unilateral primary osteoarthritis, left knee: Secondary | ICD-10-CM | POA: Diagnosis not present

## 2021-05-17 DIAGNOSIS — M1811 Unilateral primary osteoarthritis of first carpometacarpal joint, right hand: Secondary | ICD-10-CM | POA: Diagnosis not present

## 2021-06-08 ENCOUNTER — Other Ambulatory Visit: Payer: Self-pay

## 2021-06-08 ENCOUNTER — Emergency Department (HOSPITAL_BASED_OUTPATIENT_CLINIC_OR_DEPARTMENT_OTHER): Payer: Medicare Other

## 2021-06-08 ENCOUNTER — Emergency Department (HOSPITAL_BASED_OUTPATIENT_CLINIC_OR_DEPARTMENT_OTHER)
Admission: EM | Admit: 2021-06-08 | Discharge: 2021-06-09 | Disposition: A | Discharge status: Home / Self Care | Payer: Medicare Other | Attending: Emergency Medicine | Admitting: Emergency Medicine

## 2021-06-08 ENCOUNTER — Encounter (HOSPITAL_BASED_OUTPATIENT_CLINIC_OR_DEPARTMENT_OTHER): Payer: Self-pay

## 2021-06-08 DIAGNOSIS — I10 Essential (primary) hypertension: Secondary | ICD-10-CM | POA: Diagnosis not present

## 2021-06-08 DIAGNOSIS — R52 Pain, unspecified: Secondary | ICD-10-CM

## 2021-06-08 DIAGNOSIS — R55 Syncope and collapse: Secondary | ICD-10-CM | POA: Insufficient documentation

## 2021-06-08 DIAGNOSIS — Z96651 Presence of right artificial knee joint: Secondary | ICD-10-CM | POA: Diagnosis not present

## 2021-06-08 DIAGNOSIS — M25562 Pain in left knee: Secondary | ICD-10-CM | POA: Diagnosis not present

## 2021-06-08 DIAGNOSIS — F17210 Nicotine dependence, cigarettes, uncomplicated: Secondary | ICD-10-CM | POA: Diagnosis not present

## 2021-06-08 DIAGNOSIS — Z20822 Contact with and (suspected) exposure to covid-19: Secondary | ICD-10-CM | POA: Insufficient documentation

## 2021-06-08 MED ORDER — FENTANYL CITRATE (PF) 100 MCG/2ML IJ SOLN
50.0000 ug | Freq: Once | INTRAMUSCULAR | Status: AC
  Administered 2021-06-08: 50 ug via INTRAMUSCULAR
  Filled 2021-06-08: qty 2

## 2021-06-08 NOTE — ED Notes (Signed)
ED Provider at bedside. 

## 2021-06-08 NOTE — ED Provider Notes (Signed)
Emergency Department Provider Note   I have reviewed the triage vital signs and the nursing notes.   HISTORY  Chief Complaint Knee Pain   HPI Richard Lynch is a 70 y.o. male with past history reviewed below presents to the emergency department with left knee pain without history of trauma.  Symptoms began today and progressively worsened.  He is tried a cane and crutches but pain is moderate to severe and worse with movement.  Denies fevers, chills, redness to the knee.  He has had a knee replacement on the right and is followed by [facility] in [place]. He is anticoagulated.   While awaiting evaluation patient had a near syncope event.  Patient's wife is at bedside and states that he became unresponsive to her verbally but eyes remained open.  He remained seated in the wheelchair.  He felt lightheaded.  He states he felt warm/hot all over prior to having symptoms.  He states he has had episodes of A. fib in the past which feels similar.  He denies any chest pain or pressure.  No shortness of breath. No palpitations.    Past Medical History:  Diagnosis Date   Atrial fibrillation ([place])    Constipation due to opioid therapy 12/22/2015   Emphysema lung ([place])    Gait abnormality 02/20/2017   GERD (gastroesophageal reflux disease)    High cholesterol    Hypertension    OSA (obstructive sleep apnea)    on CPAP   Primary localized osteoarthritis of right knee 12/08/2015   Septic olecranon bursitis 06/2015   MSSA    Patient Active Problem List   Diagnosis Date Noted   Paroxysmal atrial fibrillation ([place]) 12/24/2019   Secondary hypercoagulable state ([place]) 12/24/2019   Dupuytren's contracture 07/02/2018   Spinal stenosis in cervical region 12/17/2017   Carpal tunnel syndrome of left wrist 11/27/2017   Radial nerve palsy, left 11/27/2017   Trigger ring finger of left hand 11/27/2017   Gait abnormality 02/20/2017   Restless leg syndrome 07/05/2016   Constipation due to opioid  therapy 12/22/2015   Primary localized osteoarthritis of right knee 12/08/2015   Bullous emphysema ([place]) 11/02/2015   Essential hypertension 11/02/2015   Arthritis 11/02/2015   Hyperlipidemia 11/02/2015   OSA on CPAP 11/02/2015   GERD (gastroesophageal reflux disease) 11/02/2015   Septic olecranon bursitis 06/24/2015   Blepharochalasis 12/29/2014   Hidrocystoma of left eyelid 12/29/2014   Myogenic ptosis of eyelid of both eyes 12/29/2014   Multiple lung nodules on CT 06/30/2014   Combined senile cataract 12/24/2012   HSV epithelial keratitis 12/24/2012   Myopia with astigmatism and presbyopia 12/24/2012   Neurotrophic ulcer ([place]) 12/24/2012    Past Surgical History:  Procedure Laterality Date   COLONOSCOPY W/ BIOPSIES AND POLYPECTOMY     ELBOW SURGERY  08/2015   bilateral   EYE SURGERY     'repair of tear to left eye from pliers'   HAND SURGERY Left 08/2018   Carpal Tunnel Surgery- [place]  07/2010   KNEE SURGERY     3 times on both knee's each   MULTIPLE TOOTH EXTRACTIONS     SHOULDER ARTHROSCOPY     TOTAL HIP ARTHROPLASTY  08/2010   TOTAL KNEE ARTHROPLASTY Right 12/20/2015   TOTAL KNEE ARTHROPLASTY Right 12/20/2015   Procedure: TOTAL KNEE ARTHROPLASTY;  Surgeon: [name], MD;  Location: [facility];  Service: Orthopedics;  Laterality: Right;    Allergies Patient has no known allergies.  Family  History  Problem Relation Age of Onset   Emphysema Father    Heart disease Father    Heart disease Mother    Colon cancer Mother    Breast cancer Mother     Social History Social History   Tobacco Use   Smoking status: Every Day    Packs/day: 1.00    Types: Cigarettes    Start date: 01/24/1973   Smokeless tobacco: Never   Tobacco comments:    1 pack daily  Vaping Use   Vaping Use: Former   Substances: Nicotine  Substance Use Topics   Alcohol use: Yes    Alcohol/week: 4.0 standard drinks    Types: 2 Cans of beer, 2 Standard drinks or equivalent  per week    Comment: daily   Drug use: No    Review of Systems  Constitutional: No fever/chills Eyes: No visual changes. ENT: No sore throat. Cardiovascular: Denies chest pain. Positive near syncope.  Respiratory: Denies shortness of breath. Gastrointestinal: No abdominal pain.  No nausea, no vomiting.  No diarrhea.  No constipation. Genitourinary: Negative for dysuria. Musculoskeletal: Negative for back pain. Positive left knee pain.  Skin: Negative for rash. Neurological: Negative for headaches, focal weakness or numbness.  10-point ROS otherwise negative.  ____________________________________________   PHYSICAL EXAM:  VITAL SIGNS: ED Triage Vitals  Enc Vitals Group     BP 06/08/21 2101 103/73     Pulse Rate 06/08/21 2101 97     Resp 06/08/21 2101 16     Temp 06/08/21 2101 98.5 F (36.9 C)     Temp Source 06/08/21 2101 Oral     SpO2 06/08/21 2101 94 %     Weight 06/08/21 2102 182 lb (82.6 kg)     Height 06/08/21 2102 '5\' 10"'$  (1.778 m)   Constitutional: Alert and oriented. Patient appears moderately uncomfortable but able to provide full history.  Eyes: Conjunctivae are normal. Head: Atraumatic. Nose: No congestion/rhinnorhea. Mouth/Throat: Mucous membranes are moist.   Neck: No stridor.   Cardiovascular: Normal rate, regular rhythm. Good peripheral circulation. Grossly normal heart sounds.   Respiratory: Normal respiratory effort.  No retractions. Lungs CTAB. Gastrointestinal: Soft and nontender. No distention.  Musculoskeletal: Left knee swelling without warmth or erythema. No ankle swelling or pain.  Neurologic:  Normal speech and language. No gross focal neurologic deficits are appreciated.  Skin:  Skin is warm, dry and intact. No rash noted.   ____________________________________________   LABS (all labs ordered are listed, but only abnormal results are displayed)  Labs Reviewed  COMPREHENSIVE METABOLIC PANEL - Abnormal; Notable for the following  components:      Result Value   Glucose, Bld 136 (*)    All other components within normal limits  CBC WITH DIFFERENTIAL/PLATELET - Abnormal; Notable for the following components:   WBC 15.8 (*)    RBC 4.15 (*)    Neutro Abs 11.9 (*)    Monocytes Absolute 1.6 (*)    Abs Immature Granulocytes 0.08 (*)    All other components within normal limits  MAGNESIUM - Abnormal; Notable for the following components:   Magnesium 1.6 (*)    All other components within normal limits  RESP PANEL BY RT-PCR (FLU A&B, COVID) ARPGX2  LIPASE, BLOOD  TSH  TROPONIN I (HIGH SENSITIVITY)  TROPONIN I (HIGH SENSITIVITY)   ____________________________________________  EKG   EKG Interpretation  Date/Time:  Wednesday June 08 2021 23:34:13 EDT Ventricular Rate:  83 PR Interval:  143 QRS Duration: 81 QT Interval:  384 QTC Calculation: 452 R Axis:   69 Text Interpretation: Sinus rhythm Confirmed by Nanda Quinton [id number]) on 06/08/2021 11:36:57 PM        ____________________________________________  RADIOLOGY  DG Chest Portable 1 View  Result Date: 06/09/2021 CLINICAL DATA:  Near syncopal episode EXAM: PORTABLE CHEST 1 VIEW COMPARISON:  CT from 08/19/2020 FINDINGS: Cardiac shadow is within normal limits. Postsurgical changes in cervicothoracic junction are seen. Lungs are well aerated bilaterally. No focal infiltrate or sizable effusion is seen. No bony abnormality is noted. IMPRESSION: No acute abnormality noted. Electronically Signed   By: Inez Catalina M.D.   On: 06/09/2021 00:52   DG Knee 3 Views Left  Result Date: 06/08/2021 CLINICAL DATA:  Left knee pain today, no known injury. EXAM: LEFT KNEE - 3 VIEW COMPARISON:  Knee radiograph 05/17/2021 left tibia/fibula exam 03/30/2021 FINDINGS: Imaging obtained standing. Lateral tibiofemoral joint space narrowing. Moderate tricompartmental peripheral osteophytes and spurring of the tibial spines. There is a large fragmented osteophyte arising from the  superior patella. Large knee joint effusion. No erosion or bony destruction. No fracture. Anterior soft tissue edema. IMPRESSION: 1. Moderate tricompartmental osteoarthritis. No acute osseous findings. 2. Large knee joint effusion increased from prior exam. Electronically Signed   By: Keith Rake M.D.   On: 06/08/2021 21:43    ____________________________________________   PROCEDURES  Procedure(s) performed:   Procedures  None  ____________________________________________   INITIAL IMPRESSION / ASSESSMENT AND PLAN / ED COURSE  Pertinent labs & imaging results that were available during my care of the patient were reviewed by me and considered in my medical decision making (see chart for details).   Patient presents emergency department with left knee pain. No fever. No findings on exam to suggest septic joint. Plain films reviewed.   11:30 PM  Called to the patient bedside.  While waiting for evaluation the patient was found to be near syncopal.  Patient states his pain is so severe he felt lightheaded and got very sweaty and clammy.  He is not having chest pain.  He is mainly having pain in the left knee.  Patient was in fast track and moved to an acute care room.  His vital signs are within normal limits.  EKG shows sinus rhythm with no acute ischemic or arrhythmia change. IM fentanyl given for pain. Will place on cardiac monitor.   02:00 AM  Patient's lab work here is reassuring.  Mild leukocytosis and mild mild Hypo Mg. Plan to replace here with IVF. Patient feeling much better after pain mgmt here. Knee exam is consistent with effusion but no other findings to suspect infectious/septic process. Patient is anticoagulated making therapeutic joint aspiration higher risk if infection not strongly suspected. Suspect that his near syncope was pain related but will follow repeat troponin and reassess after additional ED monitoring.   Repeat troponin negative. EKG interpreted as above.  Plan for pain mgmt at home and ortho follow up. Discussed ED return precautions. No abnormalities on telemetry in the ED or vital sign changes. Near syncope symptoms resolved with pain mgmt here. Doubt cardiogenic syncope cause.  drug database reviewed prior to pain med Rx.  ____________________________________________  FINAL CLINICAL IMPRESSION(S) / ED DIAGNOSES  Final diagnoses:  Acute pain of left knee  Near syncope     MEDICATIONS GIVEN DURING THIS VISIT:  Medications  lactated ringers infusion (0 mLs Intravenous Stopped 06/09/21 0440)  fentaNYL (SUBLIMAZE) injection 50 mcg (50 mcg Intramuscular Given 06/08/21 2339)  sodium chloride 0.9 % bolus  500 mL ( Intravenous Stopped 06/09/21 0148)  magnesium sulfate IVPB 1 g 100 mL (0 g Intravenous Stopped 06/09/21 0215)  oxyCODONE-acetaminophen (PERCOCET/ROXICET) 5-325 MG per tablet 1 tablet (1 tablet Oral Given 06/09/21 0325)  fentaNYL (SUBLIMAZE) injection 50 mcg (50 mcg Intravenous Given 06/09/21 0412)     NEW OUTPATIENT MEDICATIONS STARTED DURING THIS VISIT:  Discharge Medication List as of 06/09/2021  3:51 AM     START taking these medications   Details  oxyCODONE-acetaminophen (PERCOCET/ROXICET) 5-325 MG tablet Take 1 tablet by mouth every 6 (six) hours as needed for severe pain., Starting Thu 06/09/2021, Normal        Note:  This document was prepared using Dragon voice recognition software and may include unintentional dictation errors.  Nanda Quinton, MD, [facility] Emergency Medicine    Torre [place], [facility], MD 06/09/21 [phone]

## 2021-06-08 NOTE — ED Triage Notes (Signed)
Pt c/o pain to left knee x today-denies injury-states he needs knee replacement-NAD-to triage in w/c

## 2021-06-09 ENCOUNTER — Emergency Department (HOSPITAL_BASED_OUTPATIENT_CLINIC_OR_DEPARTMENT_OTHER): Payer: Medicare Other

## 2021-06-09 ENCOUNTER — Other Ambulatory Visit: Payer: Self-pay

## 2021-06-09 DIAGNOSIS — E782 Mixed hyperlipidemia: Secondary | ICD-10-CM | POA: Diagnosis not present

## 2021-06-09 DIAGNOSIS — J40 Bronchitis, not specified as acute or chronic: Secondary | ICD-10-CM | POA: Diagnosis not present

## 2021-06-09 DIAGNOSIS — K219 Gastro-esophageal reflux disease without esophagitis: Secondary | ICD-10-CM | POA: Diagnosis not present

## 2021-06-09 DIAGNOSIS — I480 Paroxysmal atrial fibrillation: Secondary | ICD-10-CM | POA: Diagnosis not present

## 2021-06-09 DIAGNOSIS — J439 Emphysema, unspecified: Secondary | ICD-10-CM | POA: Diagnosis not present

## 2021-06-09 DIAGNOSIS — M25562 Pain in left knee: Secondary | ICD-10-CM | POA: Diagnosis not present

## 2021-06-09 DIAGNOSIS — J449 Chronic obstructive pulmonary disease, unspecified: Secondary | ICD-10-CM | POA: Diagnosis not present

## 2021-06-09 DIAGNOSIS — R55 Syncope and collapse: Secondary | ICD-10-CM | POA: Diagnosis not present

## 2021-06-09 DIAGNOSIS — M1712 Unilateral primary osteoarthritis, left knee: Secondary | ICD-10-CM | POA: Diagnosis not present

## 2021-06-09 DIAGNOSIS — E7800 Pure hypercholesterolemia, unspecified: Secondary | ICD-10-CM | POA: Diagnosis not present

## 2021-06-09 DIAGNOSIS — I10 Essential (primary) hypertension: Secondary | ICD-10-CM | POA: Diagnosis not present

## 2021-06-09 LAB — CBC WITH DIFFERENTIAL/PLATELET
Abs Immature Granulocytes: 0.08 10*3/uL — ABNORMAL HIGH (ref 0.00–0.07)
Basophils Absolute: 0.1 10*3/uL (ref 0.0–0.1)
Basophils Relative: 0 %
Eosinophils Absolute: 0.1 10*3/uL (ref 0.0–0.5)
Eosinophils Relative: 1 %
HCT: 40.7 % (ref 39.0–52.0)
Hemoglobin: 13.9 g/dL (ref 13.0–17.0)
Immature Granulocytes: 1 %
Lymphocytes Relative: 13 %
Lymphs Abs: 2.1 10*3/uL (ref 0.7–4.0)
MCH: 33.5 pg (ref 26.0–34.0)
MCHC: 34.2 g/dL (ref 30.0–36.0)
MCV: 98.1 fL (ref 80.0–100.0)
Monocytes Absolute: 1.6 10*3/uL — ABNORMAL HIGH (ref 0.1–1.0)
Monocytes Relative: 10 %
Neutro Abs: 11.9 10*3/uL — ABNORMAL HIGH (ref 1.7–7.7)
Neutrophils Relative %: 75 %
Platelets: 284 10*3/uL (ref 150–400)
RBC: 4.15 MIL/uL — ABNORMAL LOW (ref 4.22–5.81)
RDW: 13.8 % (ref 11.5–15.5)
WBC: 15.8 10*3/uL — ABNORMAL HIGH (ref 4.0–10.5)
nRBC: 0 % (ref 0.0–0.2)

## 2021-06-09 LAB — LIPASE, BLOOD: Lipase: 22 U/L (ref 11–51)

## 2021-06-09 LAB — COMPREHENSIVE METABOLIC PANEL
ALT: 24 U/L (ref 0–44)
AST: 26 U/L (ref 15–41)
Albumin: 4 g/dL (ref 3.5–5.0)
Alkaline Phosphatase: 88 U/L (ref 38–126)
Anion gap: 13 (ref 5–15)
BUN: 19 mg/dL (ref 8–23)
CO2: 26 mmol/L (ref 22–32)
Calcium: 8.9 mg/dL (ref 8.9–10.3)
Chloride: 100 mmol/L (ref 98–111)
Creatinine, Ser: 1.09 mg/dL (ref 0.61–1.24)
GFR, Estimated: >60 mL/min (ref >60)
Glucose, Bld: 136 mg/dL — ABNORMAL HIGH (ref 70–99)
Potassium: 4 mmol/L (ref 3.5–5.1)
Sodium: 139 mmol/L (ref 135–145)
Total Bilirubin: 0.8 mg/dL (ref 0.3–1.2)
Total Protein: 7.7 g/dL (ref 6.5–8.1)

## 2021-06-09 LAB — TSH: TSH: 1.106 u[IU]/mL (ref 0.350–4.500)

## 2021-06-09 LAB — TROPONIN I (HIGH SENSITIVITY)
Troponin I (High Sensitivity): 3 ng/L (ref <18)
Troponin I (High Sensitivity): 2 ng/L (ref <18)

## 2021-06-09 LAB — RESP PANEL BY RT-PCR (FLU A&B, COVID) ARPGX2
Influenza A by PCR: NEGATIVE
Influenza B by PCR: NEGATIVE
SARS Coronavirus 2 by RT PCR: NEGATIVE

## 2021-06-09 LAB — MAGNESIUM: Magnesium: 1.6 mg/dL — ABNORMAL LOW (ref 1.7–2.4)

## 2021-06-09 MED ORDER — OXYCODONE-ACETAMINOPHEN 5-325 MG PO TABS: 1.0000 | ORAL_TABLET | Freq: Four times a day (QID) | ORAL | 0 refills | Status: DC | PRN

## 2021-06-09 MED ORDER — LACTATED RINGERS IV SOLN: INTRAVENOUS | Status: DC

## 2021-06-09 MED ORDER — OXYCODONE-ACETAMINOPHEN 5-325 MG PO TABS
1.0000 | ORAL_TABLET | Freq: Once | ORAL | Status: AC
  Administered 2021-06-09: 1 via ORAL
  Filled 2021-06-09: qty 1

## 2021-06-09 MED ORDER — SODIUM CHLORIDE 0.9 % IV BOLUS
500.0000 mL | Freq: Once | INTRAVENOUS | Status: AC
  Administered 2021-06-09: 500 mL via INTRAVENOUS

## 2021-06-09 MED ORDER — MAGNESIUM SULFATE IN D5W 1-5 GM/100ML-% IV SOLN
1.0000 g | Freq: Once | INTRAVENOUS | Status: AC
  Administered 2021-06-09: 1 g via INTRAVENOUS
  Filled 2021-06-09: qty 100

## 2021-06-09 MED ORDER — FENTANYL CITRATE (PF) 100 MCG/2ML IJ SOLN
50.0000 ug | Freq: Once | INTRAMUSCULAR | Status: AC
  Administered 2021-06-09: 50 ug via INTRAVENOUS
  Filled 2021-06-09: qty 2

## 2021-06-09 NOTE — Discharge Instructions (Addendum)
You are seen in the emergency room today with knee pain.  I called in some pain medications will have you follow closely with your orthopedic doctors for follow-up treatment.  If he develops fevers or new redness he should return to the emergency department.  I have called in some pain medication to your pharmacy.  Please do not take this with alcohol or other pain medications.  It can make you drowsy and impair your ability to drive a car.

## 2021-06-10 DIAGNOSIS — M1712 Unilateral primary osteoarthritis, left knee: Secondary | ICD-10-CM | POA: Diagnosis not present

## 2021-06-30 ENCOUNTER — Other Ambulatory Visit: Payer: Self-pay | Admitting: Neurology

## 2021-06-30 ENCOUNTER — Other Ambulatory Visit: Payer: Self-pay

## 2021-06-30 ENCOUNTER — Ambulatory Visit (INDEPENDENT_AMBULATORY_CARE_PROVIDER_SITE_OTHER): Payer: Medicare Other | Admitting: Neurology

## 2021-06-30 ENCOUNTER — Encounter: Payer: Self-pay | Admitting: Neurology

## 2021-06-30 VITALS — BP 132/77 | HR 84 | Ht 70.0 in | Wt 183.0 lb

## 2021-06-30 DIAGNOSIS — G629 Polyneuropathy, unspecified: Secondary | ICD-10-CM

## 2021-06-30 DIAGNOSIS — I2510 Atherosclerotic heart disease of native coronary artery without angina pectoris: Secondary | ICD-10-CM | POA: Diagnosis not present

## 2021-06-30 DIAGNOSIS — G5632 Lesion of radial nerve, left upper limb: Secondary | ICD-10-CM

## 2021-06-30 DIAGNOSIS — M4802 Spinal stenosis, cervical region: Secondary | ICD-10-CM | POA: Diagnosis not present

## 2021-06-30 DIAGNOSIS — G5623 Lesion of ulnar nerve, bilateral upper limbs: Secondary | ICD-10-CM

## 2021-06-30 DIAGNOSIS — R29898 Other symptoms and signs involving the musculoskeletal system: Secondary | ICD-10-CM

## 2021-06-30 MED ORDER — GABAPENTIN 300 MG PO CAPS: 600.0000 mg | ORAL_CAPSULE | Freq: Three times a day (TID) | ORAL | 3 refills | Status: DC

## 2021-06-30 NOTE — Patient Instructions (Addendum)
Increase gabapentin to 300 mg in the morning and afternoon, continue '600mg'$  at bedtime x 1 week, then increase to '600mg'$  three times  MRI cervical spine without contrast  Referral to [facility]  Return to clinic in 6 months

## 2021-06-30 NOTE — Progress Notes (Signed)
Follow-up Visit   Date: 06/30/21   Richard Lynch MRN: [id number] DOB: 07-14-1951   Interim History: Richard Lynch is a 70 y.o. left-handed Caucasian male with hypertension, hyperlipidemia, and tobacco use returning to the clinic for follow-up of polyneuropathy.  The patient was accompanied to the clinic by self.  History of present illness: In 2016, he began having bilateral shoulder pain and hand tingling which steadily progressed over the years.  In February 2019, he had cervical decompression and fusion at C5-6 to T1-2 in [place] which helped his shoulder pain and hand tingling.   Over the course of the next few months, he developed clawing of the left hand, and difficulty with extending the fingers. Electrodiagnostic testing in October 2019 performed by [name] at [facility] which was interpreted as severe subacute left C8 radiculopathy, left median neuropathy at the wrist, and possible left ulnar at the wrist.   Based on these findings, he had left carpal tunnel release and Guyon canal release as well as anterior interosseous nerve transfer in December 2019.  Unfortunately, he has had not had any improvement and continues to have persistent numbness/tingling and weakness, especially with finger extension and grip on the left hand.   He also reports about a 2-year history of bilateral feet numbness below the level of the ankle.  Symptoms are constant.  He continues to have constant numbness over the fingers on both hands.  His grip is very weak on the left and he is starting to experience similar symptoms in the right hand.  He has a 20-year history of drinking 2-3 alcoholic beverages nightly.  He also has a 42-year history of smoking and is down to half a pack daily.  He previously had history of prediabetes which was corrected with lifestyle modifications.  He reports that his mother had neuropathy and similar symptoms late in her life.  She was also diabetic.  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   He was evaluated in the office here in June 2020 and diagnosed with chronic inflammatory demyelinating polyradiculoneuropathy based on electrodiagnostic and CSF findings (CSF protein 105, normal cell count).  He had loading dose of Solu-Medrol 1 g at the end of July and started on maintenance 1g every 21 days. In November 2020, he was transitioned from Solumedrol to IVIG due to lack of improvement. Unfortunately, he denies any change in hand strength or paresthesias.    UPDATE 06/30/2021:  Unfortunately, there has been no improvement in his hand weakness or tingling. He has difficulty with grasping objects with the left hand and now noticing weakness in the right hand also.  He has been off IVIG (trial for possible CIDP).  He was evaluated at [facility] by Dr. Erik Obey who did not feel that symptoms were consistent with CIDP and likely combination of cervical myelopathy and neuropathy.  He is frustrated at the lack of treatment options and improvement.    He is taking gabapentin 644m at bedtime and 3019min the morning, and requesting to increase the dose to see if it helps his pain.   Medications:  Current Outpatient Medications on File Prior to Visit  Medication Sig Dispense Refill   amphetamine-dextroamphetamine (ADDERALL) 20 MG tablet Take 20-30 mg by mouth See admin instructions. Take 1 tablet every morning then take 1 and 1/2 tablets in the afternoon if needed     apixaban (ELIQUIS) 5 MG TABS tablet 1 tablet  Ascorbic Acid (VITAMIN C) 100 MG tablet Take 100 mg by mouth daily.     atenolol (TENORMIN) 25 MG tablet Take 25 mg by mouth daily.      atorvastatin (LIPITOR) 40 MG tablet Take 40 mg by mouth daily.     B Complex-C (SUPER B COMPLEX PO) Take by mouth.     candesartan (ATACAND) 32 MG tablet Take 32 mg by mouth daily.      celecoxib (CELEBREX) 200 MG capsule 1 tab po q day with food for pain and  swelling  (Patient taking differently: Take 200 mg by mouth daily.) 30 capsule 0   chlorthalidone (HYGROTON) 25 MG tablet Take 1 tablet (25 mg total) by mouth daily. Take 1/2 tablet daily. 90 tablet 3   Coenzyme Q10 (COQ-10 PO) Take 300 mg by mouth daily.     diclofenac Sodium (VOLTAREN) 1 % GEL Apply 4 g topically 4 (four) times daily. 100 g 0   diltiazem (CARDIZEM) 30 MG tablet Take 1 tablet every 4 hours AS NEEDED for AFIB heart rate >100 45 tablet 1   ELIQUIS 5 MG TABS tablet TAKE 1 TABLET BY MOUTH TWICE A DAY 180 tablet 3   gabapentin (NEURONTIN) 300 MG capsule Take 1 tablet in the morning and 2 tablets at bedtime. 270 capsule 3   Multiple Vitamin (MULTIVITAMIN WITH MINERALS) TABS tablet Take 1 tablet by mouth daily.     nicotine (NICODERM CQ - DOSED IN MG/24 HOURS) 21 mg/24hr patch PLACE 1 PATCH ONTO THE SKIN DAILY. 28 patch 0   pyridoxine (B-6) 100 MG tablet Take 100 mg by mouth daily.     Turmeric (QC TUMERIC COMPLEX PO) Take 1,500 mg by mouth daily.     valACYclovir (VALTREX) 500 MG tablet Take 500 mg by mouth daily.     Zinc Sulfate (ZINC 15 PO) Take by mouth.     No current facility-administered medications on file prior to visit.    Allergies: No Known Allergies   Vital Signs:  BP 132/77   Pulse 84   Ht _0  (1.778 m)   Wt 183 lb (83 kg)   SpO2 94%   BMI 26.26 kg/m     Neurological Exam: MENTAL STATUS including orientation to time, place, person, recent and remote memory, attention span and concentration, language, and fund of knowledge is normal.  Speech is not dysarthric.  CRANIAL NERVES: Face is symmetric.  No ptosis.  MOTOR: Marked atrophy of the left intrinsic hand muscles and forearm. Left claw hand deformity. No  fasciculations or abnormal movements.    Upper Extremity:  Right  Left  Deltoid  5/5   5/5   Biceps  5/5   5/5   Triceps  5/5   5/5   Infraspinatus 5/5  5/5  Medial pectoralis 5/5  5/5  Wrist extensors  5/5   5/5   Wrist flexors  5/5   5/5   Finger  extensors  5/5   2+/5   Finger flexors  5/5   5-/5   Dorsal interossei  5/5   3+/5   Abductor pollicis  5/5   1-/5   Tone (Ashworth scale)  0  0   Lower Extremity:  Right  Left  Hip flexors  5/5   5/5   Hip extensors  5/5   5/5   Adductor 5/5  5/5  Abductor 5/5  5/5  Knee flexors  5/5   5/5   Knee extensors  5/5   5/5  Dorsiflexors  5/5   5/5   Plantarflexors  5/5   5/5   Toe extensors  5/5   5/5   Toe flexors  5/5   5/5   Tone (Ashworth scale)  0  0    MSRs:                                           Right        Left brachioradialis 2+  2+  biceps 2+  2+  triceps 2+  2+  patellar 1+  2+  ankle jerk 0  0   SENSORY:  Vibration is reduced to 50% at the ankles, intact at the knees and MCP bilaterally. Temperature remains reduced over the palms/fingers.  COORDINATION/GAIT: Gait narrow based and stable.   Data: DATA: NCS/EMG of the legs 04/17/2019: The electrophysiologic findings are consistent with a sensorimotor polyneuropathy, demyelinating and axonal loss in type affecting the lower extremities.  The presence of sural-sparing suggests findings may be associated with a polyradiculoneuropathy, correlate clinically.  NCS/EMG of the arms 04/01/2019 This is a complex study of the upper extremities.  Findings are as follows:  Bilateral median neuropathy at or distal to the wrist (severe), consistent with a clinical diagnosis of carpal tunnel syndrome.   Bilateral ulnar neuropathy with slowing across the elbow, mild on the right and severe on the left. Chronic left posterior interosseous neuropathy, very severe.  Chronic C7-8 radiculopathy affecting bilateral upper extremities, moderate in degree electrically.  MRI cervical spine wo contrast 07/04/2018: 1. C5-T3 posterior-lateral fusion with decompressive laminectomy from C5-6 to T1-2. There is no arthrodesis by CT and the C5 and C6 lateral mass screws are loose. 2. Disc and facet degeneration causes multilevel foraminal  impingement. Greatest foraminal impingement is seen on the left at C2-3, bilaterally at C3-4, right at C5-6, and right at T1-2. 3. Widely patent canal at levels of laminectomy. Noncompressive spinal stenosis seen at C3-4 and C4-5. 4. Septated fluid collection within the lower laminectomy defect without dural mass effect, best attributed to seroma.  MRI thoracic spine wo contrast 07/05/2018: Cervical and thoracic fusion and laminectomy. 15 mm fluid collection in the laminectomy bed. No cord compression or cord signal abnormality. Left paracentral disc protrusion T7-8 unchanged from the prior MRI.  CSF testing 05/08/2019:   W0 R1 G73 P105*, 2 oligoclonal bands, normal IgG index, MBP and ACE  Labs 04/17/2019: ESR 6, vitamin B1 28, TSH 1.26, copper 106, SPEP with IFE no M protein, vitamin B12 >2000, folate 19.6   IMPRESSION: Complex case given overlapping cervical radiculopathy, entrapment neuropathy, and polyneuropathy.  His neurological exam is unchanged and continues to show severe left hand weakness and atrophy.  He was treated for presumed CIDP based one demyelinating changes on EMG and albuminocytologic dissociation on CSF with Solumedrol (2020) and IVIG (2021) with no improvement. He has been off IVIG since December 2021.  He is frustrated and willing to do anything to try to regain function.  I have exhausted my options locally and will refer him to [facility] for their opinion. For his pain, he may titrate gabapentin to 626m three times daily  Return to clinic in 6 months     Thank you for allowing me to participate in patient's care.  If I can answer any additional questions, I would be pleased to do so.    Sincerely,  Tilda Samudio K. Posey Pronto, DO

## 2021-07-07 ENCOUNTER — Telehealth: Payer: Self-pay

## 2021-07-07 NOTE — Telephone Encounter (Signed)
[facility] called on behalf of Richard Lynch, DDS. Pt needs two teeth extracted and would like to know how long the pt should stop his blood thinners for the procedure. Please advise.  [phone]

## 2021-07-11 ENCOUNTER — Ambulatory Visit
Admission: RE | Admit: 2021-07-11 | Discharge: 2021-07-11 | Disposition: A | Discharge status: Home / Self Care | Payer: Medicare Other | Source: Ambulatory Visit | Attending: Neurology | Admitting: Neurology

## 2021-07-11 ENCOUNTER — Other Ambulatory Visit: Payer: Self-pay

## 2021-07-11 DIAGNOSIS — M542 Cervicalgia: Secondary | ICD-10-CM | POA: Diagnosis not present

## 2021-07-11 DIAGNOSIS — M4802 Spinal stenosis, cervical region: Secondary | ICD-10-CM | POA: Diagnosis not present

## 2021-07-12 DIAGNOSIS — H04123 Dry eye syndrome of bilateral lacrimal glands: Secondary | ICD-10-CM | POA: Diagnosis not present

## 2021-07-12 DIAGNOSIS — H47012 Ischemic optic neuropathy, left eye: Secondary | ICD-10-CM | POA: Diagnosis not present

## 2021-07-12 DIAGNOSIS — H2511 Age-related nuclear cataract, right eye: Secondary | ICD-10-CM | POA: Diagnosis not present

## 2021-07-12 DIAGNOSIS — H02423 Myogenic ptosis of bilateral eyelids: Secondary | ICD-10-CM | POA: Diagnosis not present

## 2021-07-12 DIAGNOSIS — Z961 Presence of intraocular lens: Secondary | ICD-10-CM | POA: Diagnosis not present

## 2021-07-12 DIAGNOSIS — H18593 Other hereditary corneal dystrophies, bilateral: Secondary | ICD-10-CM | POA: Diagnosis not present

## 2021-07-12 DIAGNOSIS — H0102B Squamous blepharitis left eye, upper and lower eyelids: Secondary | ICD-10-CM | POA: Diagnosis not present

## 2021-07-12 DIAGNOSIS — H0102A Squamous blepharitis right eye, upper and lower eyelids: Secondary | ICD-10-CM | POA: Diagnosis not present

## 2021-07-12 DIAGNOSIS — H43813 Vitreous degeneration, bilateral: Secondary | ICD-10-CM | POA: Diagnosis not present

## 2021-07-22 ENCOUNTER — Other Ambulatory Visit: Payer: Self-pay | Admitting: Neurology

## 2021-07-28 DIAGNOSIS — L989 Disorder of the skin and subcutaneous tissue, unspecified: Secondary | ICD-10-CM | POA: Diagnosis not present

## 2021-07-28 DIAGNOSIS — M1712 Unilateral primary osteoarthritis, left knee: Secondary | ICD-10-CM | POA: Diagnosis not present

## 2021-07-28 DIAGNOSIS — E782 Mixed hyperlipidemia: Secondary | ICD-10-CM | POA: Diagnosis not present

## 2021-07-28 DIAGNOSIS — E7800 Pure hypercholesterolemia, unspecified: Secondary | ICD-10-CM | POA: Diagnosis not present

## 2021-07-28 DIAGNOSIS — F17200 Nicotine dependence, unspecified, uncomplicated: Secondary | ICD-10-CM | POA: Diagnosis not present

## 2021-07-28 DIAGNOSIS — K219 Gastro-esophageal reflux disease without esophagitis: Secondary | ICD-10-CM | POA: Diagnosis not present

## 2021-07-28 DIAGNOSIS — J439 Emphysema, unspecified: Secondary | ICD-10-CM | POA: Diagnosis not present

## 2021-07-28 DIAGNOSIS — Z23 Encounter for immunization: Secondary | ICD-10-CM | POA: Diagnosis not present

## 2021-07-28 DIAGNOSIS — I10 Essential (primary) hypertension: Secondary | ICD-10-CM | POA: Diagnosis not present

## 2021-07-28 DIAGNOSIS — I480 Paroxysmal atrial fibrillation: Secondary | ICD-10-CM | POA: Diagnosis not present

## 2021-08-03 ENCOUNTER — Ambulatory Visit: Payer: Medicare Other | Admitting: Student

## 2021-08-03 ENCOUNTER — Other Ambulatory Visit: Payer: Self-pay

## 2021-08-03 ENCOUNTER — Encounter: Payer: Self-pay | Admitting: Student

## 2021-08-03 VITALS — BP 129/82 | HR 68 | Temp 97.8°F | Ht 70.0 in | Wt 185.0 lb

## 2021-08-03 DIAGNOSIS — I2510 Atherosclerotic heart disease of native coronary artery without angina pectoris: Secondary | ICD-10-CM | POA: Diagnosis not present

## 2021-08-03 DIAGNOSIS — I480 Paroxysmal atrial fibrillation: Secondary | ICD-10-CM | POA: Diagnosis not present

## 2021-08-03 DIAGNOSIS — I10 Essential (primary) hypertension: Secondary | ICD-10-CM

## 2021-08-03 DIAGNOSIS — F17210 Nicotine dependence, cigarettes, uncomplicated: Secondary | ICD-10-CM | POA: Diagnosis not present

## 2021-08-03 MED ORDER — METOPROLOL SUCCINATE ER 50 MG PO TB24: 50.0000 mg | ORAL_TABLET | Freq: Every day | ORAL | 3 refills | Status: DC

## 2021-08-03 NOTE — Progress Notes (Signed)
Primary Physician/Referring:  Vernie Shanks, MD  Patient ID: Richard Lynch, male    DOB: 1951-06-10, 70 y.o.   MRN: [id number]  Chief Complaint  Patient presents with   Atrial Fibrillation   Follow-up   HPI:    Richard Lynch  is a 70 y.o. hypertension, hyperlipidemia, obstructive sleep apnea on CPAP follows Dr. Maxwell Caul, medically managed mild PAD, paroxysmal atrial fibrillation with RVR on 12/17/2019 presenting to the ED SP cardioversion, chronic inflammatory demyelinating polyneuropathy (CIDP) and received immunotherapy and also steroid therapy in the past.   Patient was last seen in our office 11/04/2019 by Dr. Einar Gip at which time he was prescribed Wellbutrin persistent smoking cessation and advised to follow-up in 6 weeks for reinforcement of tobacco cessation counseling.  However patient was unfortunately lost to follow-up until now.  [name]  Only one episode lasted long enough that he took diltiazem 30 mg with relief.  Patient reports episodes are associated with palpitations, fatigue.  Denies syncope, near syncope, chest pain.  He also wears a smart watch which alerts him when his heart rate is elevated.  Patient's primary concern today is reestablishing care for paroxysmal atrial fibrillation and he currently takes Adderall due to daytime fatigue, he would like to know if this is okay from a cardiovascular standpoint.  [name]  Past Medical History:  Diagnosis Date   Atrial fibrillation ([place])    Constipation due to opioid therapy 12/22/2015   Emphysema lung ([place])    Gait abnormality 02/20/2017   GERD (gastroesophageal reflux disease)    High cholesterol    Hypertension    OSA (obstructive sleep apnea)     on CPAP   Primary localized osteoarthritis of right knee 12/08/2015   Septic olecranon bursitis 06/2015   MSSA   Past Surgical History:  Procedure Laterality Date   COLONOSCOPY W/ BIOPSIES AND POLYPECTOMY     ELBOW SURGERY  08/2015   bilateral   EYE SURGERY     'repair of tear to left eye from pliers'   HAND SURGERY Left 08/2018   Carpal Tunnel Surgery- [place]  07/2010   KNEE SURGERY     3 times on both knee's each   MULTIPLE TOOTH EXTRACTIONS     SHOULDER ARTHROSCOPY     TOTAL HIP ARTHROPLASTY  08/2010   TOTAL KNEE ARTHROPLASTY Right 12/20/2015   TOTAL KNEE ARTHROPLASTY Right 12/20/2015   Procedure: TOTAL KNEE ARTHROPLASTY;  Surgeon: [name], MD;  Location: [facility];  Service: Orthopedics;  Laterality: Right;   Family History  Problem Relation Age of Onset   Emphysema Father    Heart disease Father    Heart disease Mother    Colon cancer Mother    Breast cancer Mother     Social History   Tobacco Use   Smoking status: Every Day    Packs/day: 1.00    Types: Cigarettes    Start date: 01/24/1973   Smokeless tobacco: Never   Tobacco comments:    1 pack daily  Substance Use Topics   Alcohol use: Yes    Alcohol/week: 4.0 standard drinks    Types: 2 Cans of beer, 2 Standard drinks or equivalent per week    Comment:  daily   Marital Status: Divorced  ROS  Review of Systems  Constitutional: Negative for malaise/fatigue and weight gain.  Cardiovascular:  Positive for dyspnea on exertion (chronic, stable) and palpitations (4 epiosdes this year). Negative for chest pain, claudication, leg swelling, near-syncope, orthopnea, paroxysmal nocturnal dyspnea and syncope.  Musculoskeletal:  Positive for arthritis (knee).  Gastrointestinal:  Negative for melena.  Neurological:  Positive for numbness (hands) and paresthesias (hands). Negative for dizziness.   Objective  Blood pressure 129/82, pulse 68, temperature 97.8 F (36.6 C), temperature source Temporal,  height 5\' 10"  (1.778 m), weight 185 lb (83.9 kg), SpO2 95 %.  Vitals with BMI 08/03/2021 06/30/2021 06/09/2021  Height 5\' 10"  5\' 10"  -  Weight 185 lbs 183 lbs -  BMI 38.75 64.33 -  Systolic 295 188 416  Diastolic 82 77 77  Pulse 68 84 86     Physical Exam Vitals reviewed.  HENT:     Head: Normocephalic and atraumatic.  Cardiovascular:     Rate and Rhythm: Normal rate and regular rhythm.     Pulses:          Carotid pulses are 2+ on the right side and 2+ on the left side with bruit.      Femoral pulses are 2+ on the right side and 2+ on the left side.      Dorsalis pedis pulses are 2+ on the right side and 0 on the left side.       Posterior tibial pulses are 1+ on the right side and 0 on the left side.     Heart sounds: Normal heart sounds, S1 normal and S2 normal. No murmur heard.   No gallop.     Comments: No leg edema, no JVD. Pulmonary:     Effort: Pulmonary effort is normal. No respiratory distress.     Breath sounds: Rhonchi (bilaterally) present. No wheezing or rales.     Comments: Decreased air movement left base  Musculoskeletal:     Right lower leg: No edema.     Left lower leg: No edema.  Neurological:     Mental Status: He is alert.   Laboratory examination:   Recent Labs    10/07/20 1515 06/09/21 0040  NA 138 139  K 3.8 4.0  CL 100 100  CO2 21 26  GLUCOSE 106* 136*  BUN 28* 19  CREATININE 1.26 1.09  CALCIUM 8.8 8.9  GFRNONAA 59* >60  GFRAA 68  --    CrCl cannot be calculated (Patient's most recent lab result is older than the maximum 21 days allowed.).  CMP Latest Ref Rng & Units 06/09/2021 10/07/2020 12/17/2019  Glucose 70 - 99 mg/dL 136(H) 106(H) 114(H)  BUN 8 - 23 mg/dL 19 28(H) 35(H)  Creatinine 0.61 - 1.24 mg/dL 1.09 1.26 2.15(H)  Sodium 135 - 145 mmol/L 139 138 138  Potassium 3.5 - 5.1 mmol/L 4.0 3.8 3.8  Chloride 98 - 111 mmol/L 100 100 110  CO2 22 - 32 mmol/L 26 21 22   Calcium 8.9 - 10.3 mg/dL 8.9 8.8 9.0  Total Protein 6.5 - 8.1 g/dL 7.7 -  -  Total Bilirubin 0.3 - 1.2 mg/dL 0.8 - -  Alkaline Phos 38 - 126 U/L 88 - -  AST 15 - 41 U/L 26 - -  ALT 0 - 44 U/L 24 - -   CBC Latest Ref Rng & Units 06/09/2021 12/17/2019 12/23/2015  WBC 4.0 - 10.5 K/uL 15.8(H) 11.6(H) 12.1(H)  Hemoglobin 13.0 - 17.0  g/dL 13.9 12.3(L) 9.1(L)  Hematocrit 39.0 - 52.0 % 40.7 38.2(L) 27.3(L)  Platelets 150 - 400 K/uL 284 295 306   Recent Labs    06/09/21 0303  TSH 1.106   External labs:  07/28/2021: HDL 46, LDL 60, total cholesterol 130, triglycerides 136,  A1c 5.5% BUN 18, creatinine 0.92, GFR >60 TSH 1.21  Cholesterol, total 103.000 m 05/27/2020 HDL 49.000 mg 05/27/2020 LDL-C 39.000 mg 05/27/2020 Triglycerides 65.000 mg 05/27/2020  Hemoglobin 13.100 g/d 05/27/2020 Platelets 295.000 K/ 12/17/2019  Creatinine, Serum 1.140 mg/ 05/27/2020 Potassium 4.100 mm 05/27/2020 ALT (SGPT) 17.000 U/L 05/27/2020    Glucose Random 81.000 02/27/2020 BUN 25.000 02/27/2020 Creatinine, Serum 1.160 02/27/2020  Cholesterol, total 128.000 12/10/2019 Triglycerides 132.000 12/10/2019 HDL 51.000 12/10/2019 LDL 54.000 12/10/2019  Hemoglobin 12.300 12/17/2019; Platelets 295.000 12/17/2019  Creatinine, Serum 2.150 12/17/2019 Potassium 3.800 12/17/2019 ALT (SGPT) 21.000 12/10/2019  TSH 2.061 12/17/2019; A1C 5.500 12/10/2019 Allergies  No Known Allergies    Medications Prior to Visit:   Outpatient Medications Prior to Visit  Medication Sig Dispense Refill   amphetamine-dextroamphetamine (ADDERALL) 20 MG tablet Take 20-30 mg by mouth See admin instructions. Take 1 tablet every morning then take 1 and 1/2 tablets in the afternoon if needed     apixaban (ELIQUIS) 5 MG TABS tablet Take 5 mg by mouth 2 (two) times daily.     Ascorbic Acid (VITAMIN C) 100 MG tablet Take 100 mg by mouth daily.     atorvastatin (LIPITOR) 40 MG tablet Take 40 mg by mouth daily.     B Complex-C (SUPER B COMPLEX PO) Take by mouth.     buPROPion (ZYBAN) 150 MG 12 hr tablet Take 150 mg by mouth daily. Hasn't  started yet     candesartan (ATACAND) 32 MG tablet Take 32 mg by mouth daily.      chlorthalidone (HYGROTON) 25 MG tablet Take 1 tablet (25 mg total) by mouth daily. Take 1/2 tablet daily. (Patient taking differently: Take 25 mg by mouth daily.) 90 tablet 3   Coenzyme Q10 (COQ-10 PO) Take 300 mg by mouth daily.     diclofenac Sodium (VOLTAREN) 1 % GEL Apply 4 g topically 4 (four) times daily. 100 g 0   diltiazem (CARDIZEM) 30 MG tablet Take 1 tablet every 4 hours AS NEEDED for AFIB heart rate >100 45 tablet 1   gabapentin (NEURONTIN) 300 MG capsule Take 2 tablets 3 times a day 540 capsule 3   Multiple Vitamin (MULTIVITAMIN WITH MINERALS) TABS tablet Take 1 tablet by mouth daily.     Turmeric (QC TUMERIC COMPLEX PO) Take 1,500 mg by mouth daily.     valACYclovir (VALTREX) 500 MG tablet Take 500 mg by mouth daily.     Zinc Sulfate (ZINC 15 PO) Take by mouth.     atenolol (TENORMIN) 25 MG tablet Take 25 mg by mouth daily.      nicotine (NICODERM CQ - DOSED IN MG/24 HOURS) 21 mg/24hr patch PLACE 1 PATCH ONTO THE SKIN DAILY. 28 patch 0   SYMBICORT 160-4.5 MCG/ACT inhaler Inhale into the lungs.     celecoxib (CELEBREX) 200 MG capsule 1 tab po q day with food for pain and  swelling (Patient not taking: Reported on 08/03/2021) 30 capsule 0   ELIQUIS 5 MG TABS tablet TAKE 1 TABLET BY MOUTH TWICE A DAY 180 tablet 3   pyridoxine (B-6) 100 MG tablet Take 100 mg by mouth daily.     No facility-administered medications prior to visit.  Final Medications at End of Visit    Current Meds  Medication Sig   amphetamine-dextroamphetamine (ADDERALL) 20 MG tablet Take 20-30 mg by mouth See admin instructions. Take 1 tablet every morning then take 1 and 1/2 tablets in the afternoon if needed   apixaban (ELIQUIS) 5 MG TABS tablet Take 5 mg by mouth 2 (two) times daily.   Ascorbic Acid (VITAMIN C) 100 MG tablet Take 100 mg by mouth daily.   atorvastatin (LIPITOR) 40 MG tablet Take 40 mg by mouth daily.   B  Complex-C (SUPER B COMPLEX PO) Take by mouth.   buPROPion (ZYBAN) 150 MG 12 hr tablet Take 150 mg by mouth daily. Hasn't started yet   candesartan (ATACAND) 32 MG tablet Take 32 mg by mouth daily.    chlorthalidone (HYGROTON) 25 MG tablet Take 1 tablet (25 mg total) by mouth daily. Take 1/2 tablet daily. (Patient taking differently: Take 25 mg by mouth daily.)   Coenzyme Q10 (COQ-10 PO) Take 300 mg by mouth daily.   diclofenac Sodium (VOLTAREN) 1 % GEL Apply 4 g topically 4 (four) times daily.   diltiazem (CARDIZEM) 30 MG tablet Take 1 tablet every 4 hours AS NEEDED for AFIB heart rate >100   gabapentin (NEURONTIN) 300 MG capsule Take 2 tablets 3 times a day   metoprolol succinate (TOPROL-XL) 50 MG 24 hr tablet Take 1 tablet (50 mg total) by mouth daily. Take with or immediately following a meal.   Multiple Vitamin (MULTIVITAMIN WITH MINERALS) TABS tablet Take 1 tablet by mouth daily.   Turmeric (QC TUMERIC COMPLEX PO) Take 1,500 mg by mouth daily.   valACYclovir (VALTREX) 500 MG tablet Take 500 mg by mouth daily.   Zinc Sulfate (ZINC 15 PO) Take by mouth.   [DISCONTINUED] atenolol (TENORMIN) 25 MG tablet Take 25 mg by mouth daily.    [DISCONTINUED] nicotine (NICODERM CQ - DOSED IN MG/24 HOURS) 21 mg/24hr patch PLACE 1 PATCH ONTO THE SKIN DAILY.   PRIVIGEN Every 3 weeks infusion by Neurology for  CIDP   Radiology:   Low dose CT Chest 08/11/2019: 1. Lung-RADS 2, benign appearance or behavior. Continue annual screening with low-dose chest CT without contrast in 12 months. 2. Two-vessel coronary atherosclerosis. 3. Aortic Atherosclerosis   Cardiac Studies:   Ultrasound arterial segmental pressure and exercise ABI 06/11/2017: Ankle-brachial indices and waveforms are normal at rest. Decreased ankle-brachial indices immediately after exercise, right side greater than left. ABIs returned to baseline within 2-3 minutes. Post exercise study is suggestive for mild underlying peripheral vascular  disease, particularly in the right lower Extremity.  Right ABI: 0.68, one minute after exercise. 0.97, three minutes after exercise. Left ABI: 0.85, one minute after exercise. 1.09, two minutes after exercise.  Carotid artery duplex 08/23/2017: Minimal stenosis in the bilateral internal carotid artery (minimal).  Minimal stenosis of the left common  carotid artery. Antegrade right vertebral artery flow. Antegrade left vertebral artery flow. F/U if clinically indicated.  Lexiscan sestamibi stress test 08/17/2017: 1. The exercise tolerance was not assessed due to pharmacologic stress. Resting EKG: Normal sinus rhythm, RBBB.  Stress EKG: Nondiagnostic for ischemia as a pharmacologic stress test. Stress symptoms included dyspnea. 2. Normal SPECT study with normal left ventricular systolic function. Low risk study.  Echocardiogram 12/31/2019:  1. Left ventricular ejection fraction, by estimation, is 60 to 65%. The  left ventricle has normal function. The left ventricle has no regional wall motion abnormalities. Left ventricular diastolic parameters were normal.  2. Right ventricular systolic  function is normal. The right ventricular size is normal. Tricuspid regurgitation signal is inadequate for assessing  PA pressure.  3. Left atrial size was mildly dilated.  4. No significant change from 08/24/2017.  Abdominal Aortic Duplex  01/28/2020:  Mild heterogeneous plaque is noted in the mid aorta. No AAA observed.  Normal iliac artery velocity and no e/o hemodynamically significant stenosis.     EKG  08/03/2021: Sinus rhythm at a rate of 69 bpm.  Normal axis.  Left atrial lodgment.  No evidence of ischemia or underlying injury pattern.  09/22/2020: Normal sinus rhythm with rate of 68 bpm, left atrial enlargement, normal axis.  No evidence of ischemia.   01/12/2020: Normal sinus rhythm at rate of 67 bpm, normal axis.  No evidence of ischemia, normal EKG.    Assessment    1. Paroxysmal atrial  fibrillation (HCC)   2. Primary hypertension   3. Coronary artery calcification seen on CAT scan   4. Nicotine dependence, cigarettes, uncomplicated      [id number] Score is 3.  Yearly risk of stroke: 3.2% (A, HTN, vasc dz).  Score of 1=0.6; 2=2.2; 3=3.2; 4=4.8; 5=7.2; 6=9.8; 7=>9.8) -(CHF; HTN; vasc disease DM,  Male = 1; Age <65 =0; 65-74 = 1,  >75 =2; stroke/embolism= 2).   Meds ordered this encounter  Medications   metoprolol succinate (TOPROL-XL) 50 MG 24 hr tablet    Sig: Take 1 tablet (50 mg total) by mouth daily. Take with or immediately following a meal.    Dispense:  90 tablet    Refill:  3    Medications Discontinued During This Encounter  Medication Reason   ELIQUIS 5 MG TABS tablet Duplicate   nicotine (NICODERM CQ - DOSED IN MG/24 HOURS) 21 mg/24hr patch Error   pyridoxine (B-6) 100 MG tablet Error   celecoxib (CELEBREX) 200 MG capsule Completed Course   atenolol (TENORMIN) 25 MG tablet Change in therapy    Orders Placed This Encounter  Procedures   EKG 12-Lead      Recommendations:   Richard Lynch  is a 70 y.o. hypertension, hyperlipidemia, obstructive sleep apnea on CPAP follows Dr. Maxwell Caul, medically managed mild PAD, paroxysmal atrial fibrillation with RVR on 12/17/2019 presenting to the ED SP cardioversion, chronic inflammatory demyelinating polyneuropathy (CIDP) and received immunotherapy and also steroid therapy in the past,   Patient was last seen in our office 11/04/2019 by Dr. Einar Gip at which time he was prescribed Wellbutrin persistent smoking cessation and advised to follow-up in 6 weeks for reinforcement of tobacco cessation counseling.  However patient was unfortunately lost to follow-up until now.  He is now referred back to our office by PCP for evaluation and management of paroxysmal atrial fibrillation.  I personally reviewed external labs, patient's lipids are well controlled.  His primary concern today is reestablishing care for management of  paroxysmal atrial fibrillation and wondering if he can continue to take Adderall.  Patient has rare episodes of atrial fibrillation, however when he does have episodes they tend to be in the evening.  He has been on Toprol-XL in the past which he tolerated without issue.  We will therefore switch atenolol to Toprol-XL 50 mg p.o. daily.  Patient is presently in sinus rhythm.  Discussed with patient regarding option of antiarrhythmic therapy given symptomatic episodes of atrial fibrillation, however shared decision was to hold off on antiarrhythmic therapy.  He is tolerating anticoagulation without bleeding diathesis, will continue Eliquis.  Patient does has upcoming tooth extraction, for which  she is advised he may hold Eliquis for 2 days prior to procedure.  Advised patient that he may continue Adderall use per his PCP, as his heart rate is well controlled.  However advised patient that if he does not need Adderall would recommend coming off of it.  Patient is otherwise stable from a cardiovascular standpoint.  Blood pressure is well controlled.  Follow-up in 6 months, sooner if needed, for paroxysmal atrial fibrillation, hypertension, hyperlipidemia  5 minutes of today's office visit were spent counseling patient regarding smoking cessation.   Alethia Berthold, PA-C 08/03/2021, 3:24 PM Office: [phone]

## 2021-08-20 DIAGNOSIS — K219 Gastro-esophageal reflux disease without esophagitis: Secondary | ICD-10-CM | POA: Diagnosis not present

## 2021-08-20 DIAGNOSIS — I480 Paroxysmal atrial fibrillation: Secondary | ICD-10-CM | POA: Diagnosis not present

## 2021-08-20 DIAGNOSIS — J40 Bronchitis, not specified as acute or chronic: Secondary | ICD-10-CM | POA: Diagnosis not present

## 2021-08-20 DIAGNOSIS — J439 Emphysema, unspecified: Secondary | ICD-10-CM | POA: Diagnosis not present

## 2021-08-20 DIAGNOSIS — I10 Essential (primary) hypertension: Secondary | ICD-10-CM | POA: Diagnosis not present

## 2021-08-20 DIAGNOSIS — E7800 Pure hypercholesterolemia, unspecified: Secondary | ICD-10-CM | POA: Diagnosis not present

## 2021-08-20 DIAGNOSIS — E782 Mixed hyperlipidemia: Secondary | ICD-10-CM | POA: Diagnosis not present

## 2021-08-20 DIAGNOSIS — J449 Chronic obstructive pulmonary disease, unspecified: Secondary | ICD-10-CM | POA: Diagnosis not present

## 2021-09-01 DIAGNOSIS — I10 Essential (primary) hypertension: Secondary | ICD-10-CM | POA: Diagnosis not present

## 2021-09-01 DIAGNOSIS — F17218 Nicotine dependence, cigarettes, with other nicotine-induced disorders: Secondary | ICD-10-CM | POA: Diagnosis not present

## 2021-09-01 DIAGNOSIS — M25511 Pain in right shoulder: Secondary | ICD-10-CM | POA: Diagnosis not present

## 2021-09-01 DIAGNOSIS — M25512 Pain in left shoulder: Secondary | ICD-10-CM | POA: Diagnosis not present

## 2021-09-01 DIAGNOSIS — M4323 Fusion of spine, cervicothoracic region: Secondary | ICD-10-CM | POA: Diagnosis not present

## 2021-09-01 DIAGNOSIS — M542 Cervicalgia: Secondary | ICD-10-CM | POA: Diagnosis not present

## 2021-09-08 ENCOUNTER — Other Ambulatory Visit: Payer: Self-pay | Admitting: Orthopedic Surgery

## 2021-09-08 DIAGNOSIS — M542 Cervicalgia: Secondary | ICD-10-CM

## 2021-09-08 DIAGNOSIS — M25511 Pain in right shoulder: Secondary | ICD-10-CM

## 2021-09-08 DIAGNOSIS — I10 Essential (primary) hypertension: Secondary | ICD-10-CM

## 2021-09-08 DIAGNOSIS — M4323 Fusion of spine, cervicothoracic region: Secondary | ICD-10-CM

## 2021-09-08 DIAGNOSIS — F17218 Nicotine dependence, cigarettes, with other nicotine-induced disorders: Secondary | ICD-10-CM

## 2021-09-14 ENCOUNTER — Other Ambulatory Visit: Payer: Self-pay | Admitting: Orthopedic Surgery

## 2021-09-14 DIAGNOSIS — M25511 Pain in right shoulder: Secondary | ICD-10-CM

## 2021-09-14 DIAGNOSIS — M1712 Unilateral primary osteoarthritis, left knee: Secondary | ICD-10-CM | POA: Diagnosis not present

## 2021-09-14 DIAGNOSIS — I10 Essential (primary) hypertension: Secondary | ICD-10-CM | POA: Diagnosis not present

## 2021-09-14 DIAGNOSIS — E782 Mixed hyperlipidemia: Secondary | ICD-10-CM | POA: Diagnosis not present

## 2021-09-14 DIAGNOSIS — J439 Emphysema, unspecified: Secondary | ICD-10-CM | POA: Diagnosis not present

## 2021-09-14 DIAGNOSIS — K219 Gastro-esophageal reflux disease without esophagitis: Secondary | ICD-10-CM | POA: Diagnosis not present

## 2021-09-14 DIAGNOSIS — E7800 Pure hypercholesterolemia, unspecified: Secondary | ICD-10-CM | POA: Diagnosis not present

## 2021-09-14 DIAGNOSIS — I480 Paroxysmal atrial fibrillation: Secondary | ICD-10-CM | POA: Diagnosis not present

## 2021-09-14 DIAGNOSIS — M25512 Pain in left shoulder: Secondary | ICD-10-CM

## 2021-09-14 DIAGNOSIS — J449 Chronic obstructive pulmonary disease, unspecified: Secondary | ICD-10-CM | POA: Diagnosis not present

## 2021-09-14 DIAGNOSIS — F17218 Nicotine dependence, cigarettes, with other nicotine-induced disorders: Secondary | ICD-10-CM

## 2021-09-14 DIAGNOSIS — J40 Bronchitis, not specified as acute or chronic: Secondary | ICD-10-CM | POA: Diagnosis not present

## 2021-09-16 ENCOUNTER — Other Ambulatory Visit: Payer: Self-pay | Admitting: Cardiology

## 2021-09-16 DIAGNOSIS — I10 Essential (primary) hypertension: Secondary | ICD-10-CM

## 2021-09-20 DIAGNOSIS — M1811 Unilateral primary osteoarthritis of first carpometacarpal joint, right hand: Secondary | ICD-10-CM | POA: Diagnosis not present

## 2021-09-28 ENCOUNTER — Ambulatory Visit
Admission: RE | Admit: 2021-09-28 | Discharge: 2021-09-28 | Disposition: A | Discharge status: Home / Self Care | Payer: Medicare Other | Source: Ambulatory Visit | Attending: Orthopedic Surgery | Admitting: Orthopedic Surgery

## 2021-09-28 DIAGNOSIS — M4323 Fusion of spine, cervicothoracic region: Secondary | ICD-10-CM

## 2021-09-28 DIAGNOSIS — M542 Cervicalgia: Secondary | ICD-10-CM | POA: Diagnosis not present

## 2021-10-02 ENCOUNTER — Other Ambulatory Visit: Payer: Medicare Other

## 2021-10-03 ENCOUNTER — Ambulatory Visit
Admission: RE | Admit: 2021-10-03 | Discharge: 2021-10-03 | Disposition: A | Discharge status: Home / Self Care | Payer: Medicare Other | Source: Ambulatory Visit | Attending: Orthopedic Surgery | Admitting: Orthopedic Surgery

## 2021-10-03 ENCOUNTER — Other Ambulatory Visit: Payer: Self-pay

## 2021-10-03 DIAGNOSIS — F17218 Nicotine dependence, cigarettes, with other nicotine-induced disorders: Secondary | ICD-10-CM

## 2021-10-03 DIAGNOSIS — M4323 Fusion of spine, cervicothoracic region: Secondary | ICD-10-CM

## 2021-10-03 DIAGNOSIS — M48061 Spinal stenosis, lumbar region without neurogenic claudication: Secondary | ICD-10-CM | POA: Diagnosis not present

## 2021-10-03 DIAGNOSIS — M25511 Pain in right shoulder: Secondary | ICD-10-CM

## 2021-10-03 DIAGNOSIS — M545 Low back pain, unspecified: Secondary | ICD-10-CM | POA: Diagnosis not present

## 2021-10-03 DIAGNOSIS — M542 Cervicalgia: Secondary | ICD-10-CM

## 2021-10-03 DIAGNOSIS — M25512 Pain in left shoulder: Secondary | ICD-10-CM

## 2021-10-03 DIAGNOSIS — I10 Essential (primary) hypertension: Secondary | ICD-10-CM

## 2021-10-03 DIAGNOSIS — M549 Dorsalgia, unspecified: Secondary | ICD-10-CM | POA: Diagnosis not present

## 2021-10-04 DIAGNOSIS — M1811 Unilateral primary osteoarthritis of first carpometacarpal joint, right hand: Secondary | ICD-10-CM | POA: Diagnosis not present

## 2021-10-05 DIAGNOSIS — G5622 Lesion of ulnar nerve, left upper limb: Secondary | ICD-10-CM | POA: Diagnosis not present

## 2021-10-05 DIAGNOSIS — M6281 Muscle weakness (generalized): Secondary | ICD-10-CM | POA: Diagnosis not present

## 2021-10-05 DIAGNOSIS — M4802 Spinal stenosis, cervical region: Secondary | ICD-10-CM | POA: Diagnosis not present

## 2021-10-05 DIAGNOSIS — R531 Weakness: Secondary | ICD-10-CM | POA: Diagnosis not present

## 2021-10-05 DIAGNOSIS — M5412 Radiculopathy, cervical region: Secondary | ICD-10-CM | POA: Diagnosis not present

## 2021-10-05 DIAGNOSIS — G6181 Chronic inflammatory demyelinating polyneuritis: Secondary | ICD-10-CM | POA: Diagnosis not present

## 2021-10-06 DIAGNOSIS — G5622 Lesion of ulnar nerve, left upper limb: Secondary | ICD-10-CM | POA: Diagnosis not present

## 2021-10-06 DIAGNOSIS — G5603 Carpal tunnel syndrome, bilateral upper limbs: Secondary | ICD-10-CM | POA: Diagnosis not present

## 2021-10-06 DIAGNOSIS — M5412 Radiculopathy, cervical region: Secondary | ICD-10-CM | POA: Diagnosis not present

## 2021-10-26 DIAGNOSIS — L570 Actinic keratosis: Secondary | ICD-10-CM | POA: Diagnosis not present

## 2021-10-26 DIAGNOSIS — L720 Epidermal cyst: Secondary | ICD-10-CM | POA: Diagnosis not present

## 2021-10-26 DIAGNOSIS — D485 Neoplasm of uncertain behavior of skin: Secondary | ICD-10-CM | POA: Diagnosis not present

## 2021-10-26 DIAGNOSIS — L821 Other seborrheic keratosis: Secondary | ICD-10-CM | POA: Diagnosis not present

## 2021-11-08 DIAGNOSIS — G6181 Chronic inflammatory demyelinating polyneuritis: Secondary | ICD-10-CM | POA: Diagnosis not present

## 2021-11-08 DIAGNOSIS — M5013 Cervical disc disorder with radiculopathy, cervicothoracic region: Secondary | ICD-10-CM | POA: Diagnosis not present

## 2021-11-15 DIAGNOSIS — E7800 Pure hypercholesterolemia, unspecified: Secondary | ICD-10-CM | POA: Diagnosis not present

## 2021-11-15 DIAGNOSIS — K219 Gastro-esophageal reflux disease without esophagitis: Secondary | ICD-10-CM | POA: Diagnosis not present

## 2021-11-15 DIAGNOSIS — E790 Hyperuricemia without signs of inflammatory arthritis and tophaceous disease: Secondary | ICD-10-CM | POA: Diagnosis not present

## 2021-11-15 DIAGNOSIS — I10 Essential (primary) hypertension: Secondary | ICD-10-CM | POA: Diagnosis not present

## 2021-11-15 DIAGNOSIS — I480 Paroxysmal atrial fibrillation: Secondary | ICD-10-CM | POA: Diagnosis not present

## 2021-11-15 DIAGNOSIS — E782 Mixed hyperlipidemia: Secondary | ICD-10-CM | POA: Diagnosis not present

## 2021-11-15 DIAGNOSIS — J439 Emphysema, unspecified: Secondary | ICD-10-CM | POA: Diagnosis not present

## 2021-11-15 DIAGNOSIS — J449 Chronic obstructive pulmonary disease, unspecified: Secondary | ICD-10-CM | POA: Diagnosis not present

## 2021-11-15 DIAGNOSIS — M1712 Unilateral primary osteoarthritis, left knee: Secondary | ICD-10-CM | POA: Diagnosis not present

## 2021-11-15 DIAGNOSIS — R42 Dizziness and giddiness: Secondary | ICD-10-CM | POA: Diagnosis not present

## 2021-11-15 DIAGNOSIS — F17200 Nicotine dependence, unspecified, uncomplicated: Secondary | ICD-10-CM | POA: Diagnosis not present

## 2021-11-21 ENCOUNTER — Telehealth: Payer: Self-pay

## 2021-11-22 ENCOUNTER — Other Ambulatory Visit: Payer: Self-pay

## 2021-11-22 ENCOUNTER — Inpatient Hospital Stay: Payer: Medicare Other

## 2021-11-22 ENCOUNTER — Encounter: Payer: Self-pay | Admitting: Student

## 2021-11-22 ENCOUNTER — Ambulatory Visit: Payer: Medicare Other | Admitting: Student

## 2021-11-22 VITALS — BP 132/77 | HR 86 | Temp 98.0°F | Wt 192.0 lb

## 2021-11-22 DIAGNOSIS — I480 Paroxysmal atrial fibrillation: Secondary | ICD-10-CM

## 2021-11-22 DIAGNOSIS — I493 Ventricular premature depolarization: Secondary | ICD-10-CM

## 2021-11-22 NOTE — Progress Notes (Signed)
Primary Physician/Referring:  Vernie Shanks, MD  Patient ID: Richard Lynch, male    DOB: [birth year], 71 y.o.   MRN: [id number]  Chief Complaint  Patient presents with   Atrial Fibrillation   Medication Management   Hypotension   HPI:    Richard Lynch  is a 71 y.o. hypertension, hyperlipidemia, obstructive sleep apnea on CPAP follows Dr. Maxwell Caul, medically managed mild PAD, paroxysmal atrial fibrillation with RVR on 12/17/2019 presenting to the ED SP cardioversion, chronic inflammatory demyelinating polyneuropathy (CIDP) and received immunotherapy and also steroid therapy in the past.   Patient had been seen in our office in 10/2019 and was lost to follow-up until last office visit 08/03/2021 when he presented to reestablish care for paroxysmal atrial fibrillation.  On last office visit patient was stable from a cardiovascular standpoint and doing well with rate control strategy for atrial fibrillation with daily metoprolol and as needed diltiazem.  However patient presents for urgent visit at his request today with concerns of 2 episodes suggestive of A. fib in the last 2 weeks, as well as complaints of "dizziness and unsteadiness".  Patient reports 2 episodes of palpitations and jaw pain in the last 2 weeks similar to previous A. fib episodes, he took diltiazem which seemed to help, however he noticed during 1 of these episodes his blood pressure as low as 80/60 mmHg.  He also reports daily episodes of dizziness primarily when he wakes up in the morning.  Dizziness is not positional but rather constant and patient states he feels "unsteady when walking".  Denies chest pain, syncope, near syncope.  Past Medical History:  Diagnosis Date   Atrial fibrillation ([place])    Constipation due to opioid therapy 12/22/2015   Emphysema lung ([place])    Gait abnormality 02/20/2017   GERD (gastroesophageal reflux disease)    High cholesterol    Hypertension    OSA (obstructive sleep apnea)    on CPAP    Primary localized osteoarthritis of right knee 12/08/2015   Septic olecranon bursitis 06/2015   MSSA   Past Surgical History:  Procedure Laterality Date   COLONOSCOPY W/ BIOPSIES AND POLYPECTOMY     ELBOW SURGERY  08/2015   bilateral   EYE SURGERY     'repair of tear to left eye from pliers'   HAND SURGERY Left 08/2018   Carpal Tunnel Surgery- [place]  07/2010   KNEE SURGERY     3 times on both knee's each   MULTIPLE TOOTH EXTRACTIONS     SHOULDER ARTHROSCOPY     TOTAL HIP ARTHROPLASTY  08/2010   TOTAL KNEE ARTHROPLASTY Right 12/20/2015   TOTAL KNEE ARTHROPLASTY Right 12/20/2015   Procedure: TOTAL KNEE ARTHROPLASTY;  Surgeon: [name], MD;  Location: [facility];  Service: Orthopedics;  Laterality: Right;   Family History  Problem Relation Age of Onset   Emphysema Father    Heart disease Father    Heart disease Mother    Colon cancer Mother    Breast cancer Mother     Social History   Tobacco Use   Smoking status: Every Day    Packs/day: 1.00    Types: Cigarettes    Start date: 01/24/1973   Smokeless tobacco: Never   Tobacco comments:    1 pack daily  Substance Use Topics   Alcohol use: Yes    Alcohol/week: 4.0 standard drinks    Types: 2 Cans of beer, 2 Standard drinks or equivalent per week  Comment: daily   Marital Status: Divorced  ROS  Review of Systems  Cardiovascular:  Positive for dyspnea on exertion (chronic, stable) and palpitations. Negative for chest pain, claudication, leg swelling, near-syncope, orthopnea, paroxysmal nocturnal dyspnea and syncope.  Neurological:  Positive for dizziness and paresthesias (hands).   Objective  Blood pressure 132/77, pulse 86, temperature 98 F (36.7 C), temperature source Temporal, weight 192 lb (87.1 kg), SpO2 95 %.  Vitals with BMI 11/22/2021 08/03/2021 06/30/2021  Height - 5\' 10"  5\' 10"   Weight 192 lbs 185 lbs 183 lbs  BMI - 86.76 19.50  Systolic 932 671 245  Diastolic 77 82 77  Pulse 86 68 84     Orthostatic VS for the past 72 hrs (Last 3 readings):  Orthostatic BP Patient Position BP Location Cuff Size Orthostatic Pulse  11/22/21 1054 127/76 Standing Left Arm Normal 88  11/22/21 1053 134/77 Sitting Left Arm Normal 80  11/22/21 1052 117/72 Supine Left Arm Normal 78     Physical Exam Vitals reviewed.  Cardiovascular:     Rate and Rhythm: Normal rate and regular rhythm. Occasional Extrasystoles are present.    Pulses:          Carotid pulses are 2+ on the right side and 2+ on the left side with bruit.      Femoral pulses are 2+ on the right side and 2+ on the left side.      Dorsalis pedis pulses are 2+ on the right side and 0 on the left side.       Posterior tibial pulses are 1+ on the right side and 0 on the left side.     Heart sounds: Normal heart sounds, S1 normal and S2 normal. No murmur heard.   No gallop.  Pulmonary:     Effort: Pulmonary effort is normal. No respiratory distress.     Breath sounds: No wheezing, rhonchi or rales.     Comments: Decreased air movement left base  Musculoskeletal:     Right lower leg: No edema.     Left lower leg: No edema.  Neurological:     Mental Status: He is alert.   Laboratory examination:   Recent Labs    06/09/21 0040  NA 139  K 4.0  CL 100  CO2 26  GLUCOSE 136*  BUN 19  CREATININE 1.09  CALCIUM 8.9  GFRNONAA >60   CrCl cannot be calculated (Patient's most recent lab result is older than the maximum 21 days allowed.).  CMP Latest Ref Rng & Units 06/09/2021 10/07/2020 12/17/2019  Glucose 70 - 99 mg/dL 136(H) 106(H) 114(H)  BUN 8 - 23 mg/dL 19 28(H) 35(H)  Creatinine 0.61 - 1.24 mg/dL 1.09 1.26 2.15(H)  Sodium 135 - 145 mmol/L 139 138 138  Potassium 3.5 - 5.1 mmol/L 4.0 3.8 3.8  Chloride 98 - 111 mmol/L 100 100 110  CO2 22 - 32 mmol/L 26 21 22   Calcium 8.9 - 10.3 mg/dL 8.9 8.8 9.0  Total Protein 6.5 - 8.1 g/dL 7.7 - -  Total Bilirubin 0.3 - 1.2 mg/dL 0.8 - -  Alkaline Phos 38 - 126 U/L 88 - -  AST 15 - 41 U/L  26 - -  ALT 0 - 44 U/L 24 - -   CBC Latest Ref Rng & Units 06/09/2021 12/17/2019 12/23/2015  WBC 4.0 - 10.5 K/uL 15.8(H) 11.6(H) 12.1(H)  Hemoglobin 13.0 - 17.0 g/dL 13.9 12.3(L) 9.1(L)  Hematocrit 39.0 - 52.0 % 40.7 38.2(L) 27.3(L)  Platelets 150 -  400 K/uL 284 295 306   Recent Labs    06/09/21 0303  TSH 1.106   External labs:  07/28/2021: HDL 46, LDL 60, total cholesterol 130, triglycerides 136,  A1c 5.5% BUN 18, creatinine 0.92, GFR >60 TSH 1.21  Cholesterol, total 103.000 m 05/27/2020 HDL 49.000 mg 05/27/2020 LDL-C 39.000 mg 05/27/2020 Triglycerides 65.000 mg 05/27/2020  Hemoglobin 13.100 g/d 05/27/2020 Platelets 295.000 K/ 12/17/2019  Creatinine, Serum 1.140 mg/ 05/27/2020 Potassium 4.100 mm 05/27/2020 ALT (SGPT) 17.000 U/L 05/27/2020    Glucose Random 81.000 02/27/2020 BUN 25.000 02/27/2020 Creatinine, Serum 1.160 02/27/2020  Cholesterol, total 128.000 12/10/2019 Triglycerides 132.000 12/10/2019 HDL 51.000 12/10/2019 LDL 54.000 12/10/2019  Hemoglobin 12.300 12/17/2019; Platelets 295.000 12/17/2019  Creatinine, Serum 2.150 12/17/2019 Potassium 3.800 12/17/2019 ALT (SGPT) 21.000 12/10/2019  TSH 2.061 12/17/2019; A1C 5.500 12/10/2019  Allergies  No Known Allergies   Medications Prior to Visit:   Outpatient Medications Prior to Visit  Medication Sig Dispense Refill   allopurinol (ZYLOPRIM) 100 MG tablet Take 100 mg by mouth daily.     amphetamine-dextroamphetamine (ADDERALL) 20 MG tablet Take 20-30 mg by mouth See admin instructions. Take 1 tablet every morning then take 1 and 1/2 tablets in the afternoon if needed     apixaban (ELIQUIS) 5 MG TABS tablet Take 5 mg by mouth 2 (two) times daily.     Ascorbic Acid (VITAMIN C) 100 MG tablet Take 100 mg by mouth daily.     atorvastatin (LIPITOR) 40 MG tablet Take 40 mg by mouth daily.     B Complex-C (SUPER B COMPLEX PO) Take by mouth.     candesartan (ATACAND) 32 MG tablet Take 32 mg by mouth daily.      chlorthalidone (HYGROTON) 25 MG  tablet TAKE 1/2 TO 1 TABLET BY MOUTH DAILY. 90 tablet 3   Coenzyme Q10 (COQ-10 PO) Take 300 mg by mouth daily.     diclofenac Sodium (VOLTAREN) 1 % GEL Apply 4 g topically 4 (four) times daily. 100 g 0   diltiazem (CARDIZEM) 30 MG tablet Take 1 tablet every 4 hours AS NEEDED for AFIB heart rate >100 45 tablet 1   gabapentin (NEURONTIN) 300 MG capsule Take 2 tablets 3 times a day 540 capsule 3   metoprolol succinate (TOPROL-XL) 50 MG 24 hr tablet Take 1 tablet (50 mg total) by mouth daily. Take with or immediately following a meal. 90 tablet 3   Multiple Vitamin (MULTIVITAMIN WITH MINERALS) TABS tablet Take 1 tablet by mouth daily.     SYMBICORT 160-4.5 MCG/ACT inhaler Inhale into the lungs.     Turmeric (QC TUMERIC COMPLEX PO) Take 1,500 mg by mouth daily.     valACYclovir (VALTREX) 500 MG tablet Take 500 mg by mouth daily.     Zinc Sulfate (ZINC 15 PO) Take by mouth.     buPROPion (ZYBAN) 150 MG 12 hr tablet Take 150 mg by mouth daily. Hasn't started yet (Patient not taking: Reported on 11/22/2021)     No facility-administered medications prior to visit.   Final Medications at End of Visit    Current Meds  Medication Sig   allopurinol (ZYLOPRIM) 100 MG tablet Take 100 mg by mouth daily.   amphetamine-dextroamphetamine (ADDERALL) 20 MG tablet Take 20-30 mg by mouth See admin instructions. Take 1 tablet every morning then take 1 and 1/2 tablets in the afternoon if needed   apixaban (ELIQUIS) 5 MG TABS tablet Take 5 mg by mouth 2 (two) times daily.   Ascorbic Acid (VITAMIN C)  100 MG tablet Take 100 mg by mouth daily.   atorvastatin (LIPITOR) 40 MG tablet Take 40 mg by mouth daily.   B Complex-C (SUPER B COMPLEX PO) Take by mouth.   candesartan (ATACAND) 32 MG tablet Take 32 mg by mouth daily.    chlorthalidone (HYGROTON) 25 MG tablet TAKE 1/2 TO 1 TABLET BY MOUTH DAILY.   Coenzyme Q10 (COQ-10 PO) Take 300 mg by mouth daily.   diclofenac Sodium (VOLTAREN) 1 % GEL Apply 4 g topically 4 (four)  times daily.   diltiazem (CARDIZEM) 30 MG tablet Take 1 tablet every 4 hours AS NEEDED for AFIB heart rate >100   gabapentin (NEURONTIN) 300 MG capsule Take 2 tablets 3 times a day   metoprolol succinate (TOPROL-XL) 50 MG 24 hr tablet Take 1 tablet (50 mg total) by mouth daily. Take with or immediately following a meal.   Multiple Vitamin (MULTIVITAMIN WITH MINERALS) TABS tablet Take 1 tablet by mouth daily.   SYMBICORT 160-4.5 MCG/ACT inhaler Inhale into the lungs.   Turmeric (QC TUMERIC COMPLEX PO) Take 1,500 mg by mouth daily.   valACYclovir (VALTREX) 500 MG tablet Take 500 mg by mouth daily.   Zinc Sulfate (ZINC 15 PO) Take by mouth.   PRIVIGEN Every 3 weeks infusion by Neurology for  CIDP   Radiology:   Low dose CT Chest 08/11/2019: 1. Lung-RADS 2, benign appearance or behavior. Continue annual screening with low-dose chest CT without contrast in 12 months. 2. Two-vessel coronary atherosclerosis. 3. Aortic Atherosclerosis   Cardiac Studies:   Ultrasound arterial segmental pressure and exercise ABI 06/11/2017: Ankle-brachial indices and waveforms are normal at rest. Decreased ankle-brachial indices immediately after exercise, right side greater than left. ABIs returned to baseline within 2-3 minutes. Post exercise study is suggestive for mild underlying peripheral vascular disease, particularly in the right lower Extremity.  Right ABI: 0.68, one minute after exercise. 0.97, three minutes after exercise. Left ABI: 0.85, one minute after exercise. 1.09, two minutes after exercise.  Carotid artery duplex 08/23/2017: Minimal stenosis in the bilateral internal carotid artery (minimal).  Minimal stenosis of the left common  carotid artery. Antegrade right vertebral artery flow. Antegrade left vertebral artery flow. F/U if clinically indicated.  Lexiscan sestamibi stress test 08/17/2017: 1. The exercise tolerance was not assessed due to pharmacologic stress. Resting EKG: Normal sinus  rhythm, RBBB.  Stress EKG: Nondiagnostic for ischemia as a pharmacologic stress test. Stress symptoms included dyspnea. 2. Normal SPECT study with normal left ventricular systolic function. Low risk study.  Echocardiogram 12/31/2019:  1. Left ventricular ejection fraction, by estimation, is 60 to 65%. The  left ventricle has normal function. The left ventricle has no regional wall motion abnormalities. Left ventricular diastolic parameters were normal.  2. Right ventricular systolic function is normal. The right ventricular size is normal. Tricuspid regurgitation signal is inadequate for assessing  PA pressure.  3. Left atrial size was mildly dilated.  4. No significant change from 08/24/2017.  Abdominal Aortic Duplex  01/28/2020:  Mild heterogeneous plaque is noted in the mid aorta. No AAA observed.  Normal iliac artery velocity and no e/o hemodynamically significant stenosis.     EKG  11/22/2021: Sinus rhythm with single PAC at a rate of 80 bpm.  Normal axis.  Nonspecific T wave abnormality.  No evidence of ischemia or underlying injury pattern.  08/03/2021: Sinus rhythm at a rate of 69 bpm.  Normal axis.  Left atrial lodgment.  No evidence of ischemia or underlying injury pattern.  09/22/2020: Normal sinus rhythm with rate of 68 bpm, left atrial enlargement, normal axis.  No evidence of ischemia.   01/12/2020: Normal sinus rhythm at rate of 67 bpm, normal axis.  No evidence of ischemia, normal EKG.    Assessment    1. Paroxysmal atrial fibrillation (HCC)   2. PVC (premature ventricular contraction)      CHA2DS2-VASc Score is 3.  Yearly risk of stroke: 3.2% (A, HTN, vasc dz).  Score of 1=0.6; 2=2.2; 3=3.2; 4=4.8; 5=7.2; 6=9.8; 7=>9.8) -(CHF; HTN; vasc disease DM,  Male = 1; Age <65 =0; 65-74 = 1,  >75 =2; stroke/embolism= 2).   No orders of the defined types were placed in this encounter.   Medications Discontinued During This Encounter  Medication Reason   buPROPion (ZYBAN) 150  MG 12 hr tablet Patient has not taken in last 30 days    Orders Placed This Encounter  Procedures   LONG TERM MONITOR (3-14 DAYS)    Standing Status:   Future    Number of Occurrences:   1    Order Specific Question:   Where should this test be performed?    Answer:   [facility]    Order Specific Question:   Does the patient have an implanted cardiac device?    Answer:   No    Order Specific Question:   Prescribed days of wear    Answer:   6    Order Specific Question:   Type of enrollment    Answer:   Clinic Enrollment   EKG 12-Lead      Recommendations:   Richard Lynch  is a 71 y.o. hypertension, hyperlipidemia, obstructive sleep apnea on CPAP follows Dr. Maxwell Caul, medically managed mild PAD, paroxysmal atrial fibrillation with RVR on 12/17/2019 presenting to the ED SP cardioversion, chronic inflammatory demyelinating polyneuropathy (CIDP) and received immunotherapy and also steroid therapy in the past.   Patient had been seen in our office in 10/2019 and was lost to follow-up until last office visit 08/03/2021 when he presented to reestablish care for paroxysmal atrial fibrillation.  On last office visit patient was stable from a cardiovascular standpoint and doing well with rate control strategy for atrial fibrillation with daily metoprolol and as needed diltiazem.  However patient presents for urgent visit at his request today with concerns of 2 episodes suggestive of A. fib in the last 2 weeks, as well as complaints of "dizziness and unsteadiness". Patient has had more frequent episodes concerning for atrial fibrillation.   Patient continues to tolerate anticoagulation without bleeding diathesis.  EKG today reveals normal sinus rhythm with PACs, which patient has a known history of.  Given history of with PACs and atrial fibrillation, shared decision was to proceed with 2-week amatory cardiac telemetry to evaluate underlying A. fib versus PAC/PVC burden.  We will consider up  titration of AV nodal blocking agents versus initiation of antiarrhythmic therapy pending cardiac monitor results.  In regard to blood pressure, advised patient to monitor blood pressure on a daily basis and bring a written log to his next appointment.  Will not make changes to medications at this time.  Follow-up in 6 weeks, sooner if needed, for atrial fibrillation and blood pressure management.   Alethia Berthold, PA-C 11/22/2021, 2:39 PM Office: [phone]

## 2021-11-29 DIAGNOSIS — Z20822 Contact with and (suspected) exposure to covid-19: Secondary | ICD-10-CM | POA: Diagnosis not present

## 2021-12-01 ENCOUNTER — Other Ambulatory Visit: Payer: Self-pay

## 2021-12-08 DIAGNOSIS — I493 Ventricular premature depolarization: Secondary | ICD-10-CM | POA: Diagnosis not present

## 2021-12-08 DIAGNOSIS — I480 Paroxysmal atrial fibrillation: Secondary | ICD-10-CM | POA: Diagnosis not present

## 2021-12-12 DIAGNOSIS — M5412 Radiculopathy, cervical region: Secondary | ICD-10-CM | POA: Diagnosis not present

## 2021-12-12 DIAGNOSIS — G5603 Carpal tunnel syndrome, bilateral upper limbs: Secondary | ICD-10-CM | POA: Diagnosis not present

## 2021-12-12 DIAGNOSIS — Z981 Arthrodesis status: Secondary | ICD-10-CM | POA: Diagnosis not present

## 2021-12-12 DIAGNOSIS — G5622 Lesion of ulnar nerve, left upper limb: Secondary | ICD-10-CM | POA: Diagnosis not present

## 2021-12-12 DIAGNOSIS — M961 Postlaminectomy syndrome, not elsewhere classified: Secondary | ICD-10-CM | POA: Diagnosis not present

## 2021-12-16 DIAGNOSIS — I480 Paroxysmal atrial fibrillation: Secondary | ICD-10-CM | POA: Diagnosis not present

## 2021-12-16 DIAGNOSIS — I493 Ventricular premature depolarization: Secondary | ICD-10-CM | POA: Diagnosis not present

## 2021-12-29 DIAGNOSIS — Z0000 Encounter for general adult medical examination without abnormal findings: Secondary | ICD-10-CM | POA: Diagnosis not present

## 2021-12-29 DIAGNOSIS — R42 Dizziness and giddiness: Secondary | ICD-10-CM | POA: Diagnosis not present

## 2021-12-29 DIAGNOSIS — E7800 Pure hypercholesterolemia, unspecified: Secondary | ICD-10-CM | POA: Diagnosis not present

## 2021-12-29 DIAGNOSIS — M19031 Primary osteoarthritis, right wrist: Secondary | ICD-10-CM | POA: Diagnosis not present

## 2021-12-29 DIAGNOSIS — Z23 Encounter for immunization: Secondary | ICD-10-CM | POA: Diagnosis not present

## 2021-12-29 DIAGNOSIS — I10 Essential (primary) hypertension: Secondary | ICD-10-CM | POA: Diagnosis not present

## 2021-12-29 DIAGNOSIS — K219 Gastro-esophageal reflux disease without esophagitis: Secondary | ICD-10-CM | POA: Diagnosis not present

## 2021-12-29 DIAGNOSIS — F17200 Nicotine dependence, unspecified, uncomplicated: Secondary | ICD-10-CM | POA: Diagnosis not present

## 2021-12-29 DIAGNOSIS — I480 Paroxysmal atrial fibrillation: Secondary | ICD-10-CM | POA: Diagnosis not present

## 2021-12-29 DIAGNOSIS — J439 Emphysema, unspecified: Secondary | ICD-10-CM | POA: Diagnosis not present

## 2021-12-29 DIAGNOSIS — E782 Mixed hyperlipidemia: Secondary | ICD-10-CM | POA: Diagnosis not present

## 2021-12-29 DIAGNOSIS — E790 Hyperuricemia without signs of inflammatory arthritis and tophaceous disease: Secondary | ICD-10-CM | POA: Diagnosis not present

## 2021-12-29 DIAGNOSIS — Z125 Encounter for screening for malignant neoplasm of prostate: Secondary | ICD-10-CM | POA: Diagnosis not present

## 2021-12-29 DIAGNOSIS — M1712 Unilateral primary osteoarthritis, left knee: Secondary | ICD-10-CM | POA: Diagnosis not present

## 2021-12-29 DIAGNOSIS — J449 Chronic obstructive pulmonary disease, unspecified: Secondary | ICD-10-CM | POA: Diagnosis not present

## 2021-12-29 DIAGNOSIS — Z1389 Encounter for screening for other disorder: Secondary | ICD-10-CM | POA: Diagnosis not present

## 2021-12-29 NOTE — Progress Notes (Signed)
Follow-up Visit   Date: 12/30/21   Richard Lynch MRN: [id number] DOB: 1951/08/01   Interim History: Richard Lynch is a 71 y.o. left-handed Caucasian male with hypertension, hyperlipidemia, and tobacco use returning to the clinic for follow-up of polyneuropathy.  The patient was accompanied to the clinic by self.  History of present illness: In 2016, he began having bilateral shoulder pain and hand tingling which steadily progressed over the years.  In February 2019, he had cervical decompression and fusion at C5-6 to T1-2 in [place] which helped his shoulder pain and hand tingling.   Over the course of the next few months, he developed clawing of the left hand, and difficulty with extending the fingers. Electrodiagnostic testing in October 2019 performed by [name] at [place] which was interpreted as severe subacute left C8 radiculopathy, left median neuropathy at the wrist, and possible left ulnar at the wrist.   Based on these findings, he had left carpal tunnel release and Guyon canal release as well as anterior interosseous nerve transfer in December 2019.  Unfortunately, he has had not had any improvement and continues to have persistent numbness/tingling and weakness, especially with finger extension and grip on the left hand.   He also reports about a 2-year history of bilateral feet numbness below the level of the ankle.  Symptoms are constant.  He continues to have constant numbness over the fingers on both hands.  His grip is very weak on the left and he is starting to experience similar symptoms in the right hand.  He has a 20-year history of drinking 2-3 alcoholic beverages nightly.  He also has a 42-year history of smoking and is down to half a pack daily.  He previously had history of prediabetes which was corrected with lifestyle modifications.  He reports that his mother had neuropathy and similar symptoms late in her life.  She was also diabetic.  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   He was evaluated in the office here in June 2020 and diagnosed with chronic inflammatory demyelinating polyradiculoneuropathy based on electrodiagnostic and CSF findings (CSF protein 105, normal cell count).  He had loading dose of Solu-Medrol 1 g at the end of July and started on maintenance 1g every 21 days. In November 2020, he was transitioned from Solumedrol to IVIG due to lack of improvement. Unfortunately, he denies any change in hand strength or paresthesias.    UPDATE 06/30/2021:  Unfortunately, there has been no improvement in his hand weakness or tingling. He has difficulty with grasping objects with the left hand and now noticing weakness in the right hand also.  He has been off IVIG (trial for possible CIDP).  He was evaluated at [place] by Dr. Erik Obey who did not feel that symptoms were consistent with CIDP and likely combination of cervical myelopathy and neuropathy.  He is frustrated at the lack of treatment options and improvement.    He is taking gabapentin 648m at bedtime and 3053min the morning, and requesting to increase the dose to see if it helps his pain.  UPDATE 12/30/2021:  [facility] evaluation. He underwent repeat EMG which showed severe left CTS, left ulnar neuropathy at the elbow and left C6-8 radiculopathy. Neurosurgery evaluated him for cervical radiculopathy at C8 and did not recommend surgery due to being high risk and recommended conservative therapy. He was seen  by pain management who said it was high risk to perform ESI.   He was also told to see a massage therapist.  He has worsening neck pain when he tries to sit in his recliner.  He also has difficulty bending his fingers as the day progresses.  He has noticed episodic muscle twitches in the legs. He is taking gabapentin 1243m at bedtime.    Medications:  Current  Outpatient Medications on File Prior to Visit  Medication Sig Dispense Refill   allopurinol (ZYLOPRIM) 100 MG tablet Take 100 mg by mouth daily.     amphetamine-dextroamphetamine (ADDERALL) 20 MG tablet Take 20-30 mg by mouth See admin instructions. Take 1 tablet every morning then take 1 and 1/2 tablets in the afternoon if needed     apixaban (ELIQUIS) 5 MG TABS tablet Take 5 mg by mouth 2 (two) times daily.     Ascorbic Acid (VITAMIN C) 100 MG tablet Take 100 mg by mouth daily.     atorvastatin (LIPITOR) 40 MG tablet Take 40 mg by mouth daily.     B Complex-C (SUPER B COMPLEX PO) Take by mouth.     candesartan (ATACAND) 32 MG tablet Take 32 mg by mouth daily.      chlorthalidone (HYGROTON) 25 MG tablet TAKE 1/2 TO 1 TABLET BY MOUTH DAILY. 90 tablet 3   Coenzyme Q10 (COQ-10 PO) Take 300 mg by mouth daily.     diclofenac Sodium (VOLTAREN) 1 % GEL Apply 4 g topically 4 (four) times daily. 100 g 0   diltiazem (CARDIZEM) 30 MG tablet Take 1 tablet every 4 hours AS NEEDED for AFIB heart rate >100 45 tablet 1   gabapentin (NEURONTIN) 300 MG capsule Take 2 tablets 3 times a day 540 capsule 3   metoprolol succinate (TOPROL-XL) 50 MG 24 hr tablet Take 1 tablet (50 mg total) by mouth daily. Take with or immediately following a meal. 90 tablet 3   Multiple Vitamin (MULTIVITAMIN WITH MINERALS) TABS tablet Take 1 tablet by mouth daily.     SYMBICORT 160-4.5 MCG/ACT inhaler Inhale into the lungs.     Turmeric (QC TUMERIC COMPLEX PO) Take 1,500 mg by mouth daily.     valACYclovir (VALTREX) 500 MG tablet Take 500 mg by mouth daily.     Zinc Sulfate (ZINC 15 PO) Take by mouth.     No current facility-administered medications on file prior to visit.    Allergies: No Known Allergies   Vital Signs:  BP 134/66    Pulse 86    Ht 5' 10" (1.778 m)    Wt 190 lb (86.2 kg)    SpO2 98%    BMI 27.26 kg/m     Neurological Exam: MENTAL STATUS including orientation to time, place, person, recent and remote memory,  attention span and concentration, language, and fund of knowledge is normal.  Speech is not dysarthric.  CRANIAL NERVES: Face is symmetric.  No ptosis.  MOTOR: Marked atrophy of the left intrinsic hand muscles and forearm. Left claw hand deformity. No  fasciculations or abnormal movements.    Upper Extremity:  Right  Left  Deltoid  5/5   5/5   Biceps  5/5   5/5   Triceps  5/5   5/5   Infraspinatus 5/5  5/5  Medial pectoralis 5/5  5/5  Wrist extensors  5/5   5/5   Wrist flexors  5/5   5/5   Finger extensors  5/5   2+/5   Finger  flexors  5/5   5-/5   Dorsal interossei  5/5   3+/5   Abductor pollicis  5/5   1-/5   Tone (Ashworth scale)  0  0   Lower extremities 5/5 throughout   MSRs:                                           Right        Left brachioradialis 2+  2+  biceps 2+  2+  triceps 2+  2+  patellar 1+  2+  ankle jerk 0  0   SENSORY:  Vibration is reduced to at the ankles. Temperature remains reduced over the palms/fingers.  COORDINATION/GAIT: Gait narrow based and stable.   Data: DATA: NCS/EMG of the legs 04/17/2019: The electrophysiologic findings are consistent with a sensorimotor polyneuropathy, demyelinating and axonal loss in type affecting the lower extremities.  The presence of sural-sparing suggests findings may be associated with a polyradiculoneuropathy, correlate clinically.  NCS/EMG of the arms 04/01/2019 This is a complex study of the upper extremities.  Findings are as follows:  Bilateral median neuropathy at or distal to the wrist (severe), consistent with a clinical diagnosis of carpal tunnel syndrome.   Bilateral ulnar neuropathy with slowing across the elbow, mild on the right and severe on the left. Chronic left posterior interosseous neuropathy, very severe.  Chronic C7-8 radiculopathy affecting bilateral upper extremities, moderate in degree electrically.  MRI cervical spine wo contrast 07/04/2018: 1. C5-T3 posterior-lateral fusion with  decompressive laminectomy from C5-6 to T1-2. There is no arthrodesis by CT and the C5 and C6 lateral mass screws are loose. 2. Disc and facet degeneration causes multilevel foraminal impingement. Greatest foraminal impingement is seen on the left at C2-3, bilaterally at C3-4, right at C5-6, and right at T1-2. 3. Widely patent canal at levels of laminectomy. Noncompressive spinal stenosis seen at C3-4 and C4-5. 4. Septated fluid collection within the lower laminectomy defect without dural mass effect, best attributed to seroma.  MRI thoracic spine wo contrast 07/05/2018: Cervical and thoracic fusion and laminectomy. 15 mm fluid collection in the laminectomy bed. No cord compression or cord signal abnormality. Left paracentral disc protrusion T7-8 unchanged from the prior MRI.  CSF testing 05/08/2019:   W0 R1 G73 P105*, 2 oligoclonal bands, normal IgG index, MBP and ACE  Labs 04/17/2019: ESR 6, vitamin B1 28, TSH 1.26, copper 106, SPEP with IFE no M protein, vitamin B12 >2000, folate 19.6  NCS/EMG and US of the upper extremities 10/05/2021: There was electrodiagnostic and sonographic evidence of severe left median neuropathy at the wrist (carpal tunnel syndrome), left ulnar neuropathy with sonographic localization at the elbow as well as a left subchronic C6-8 cervical radiculopathy. There was no electrodiagnostic evidence of a diffuse large fiber polyneuropathy. There appears to also be sonographic evidence consistent with right median neuropathy at the wrist, as may be seen in carpal tunnel syndrome. This was not fully explored on this study given the extensive nature of prior testing and clinical question asked, but could be explored further on a future study if clinically indicated. Clinical correlation advised.    IMPRESSION: Chronic right hand weakness and paresthesias due to overlapping C8 radiculopathy and entrapment neuropathies.  Previously, he has been treated for possible CIDP based on  demyelinating changes on EMG and albuminocytologic dissociation on CSF with Solumedrol (2020) and IVIG (2021) with no improvement.  He has been off IVIG since December 2021.   He has sought second opinion at [facility] and most recently [place], who agree that his hand paresthesias and weakness is mostly secondary to C8 radiculopathy with overlapping entrapment neuropathy.  Notes reviewed. He saw Neurosurgery at [facility] who advised that he is high risk for cervical surgery and recommended conservative therapies.  Patient is discouraged that there is no management to fix his hand weakness and understand that management is supportive. For pain, continue, continue gabapentin 1227m at bedtime.  Muscle twitches may be related to gabapentin, will monitor. For neck pain, start tizanidine 265mBID as needed He has completed several cycles of OT with no ongoing benefit.  If symptoms get worse, he will contact my office to restart this Hand stiffness is most likely arthritic.  Return to clinic in 9 months  Total time spent reviewing records, interview, history/exam, documentation, and coordination of care on day of encounter:  40 min   Thank you for allowing me to participate in patient's care.  If I can answer any additional questions, I would be pleased to do so.    Sincerely,    Donika K. PaPosey ProntoDO

## 2021-12-30 ENCOUNTER — Other Ambulatory Visit: Payer: Self-pay

## 2021-12-30 ENCOUNTER — Ambulatory Visit (INDEPENDENT_AMBULATORY_CARE_PROVIDER_SITE_OTHER): Payer: Medicare Other | Admitting: Neurology

## 2021-12-30 ENCOUNTER — Encounter: Payer: Self-pay | Admitting: Neurology

## 2021-12-30 VITALS — BP 134/66 | HR 86 | Ht 70.0 in | Wt 190.0 lb

## 2021-12-30 DIAGNOSIS — M4802 Spinal stenosis, cervical region: Secondary | ICD-10-CM | POA: Diagnosis not present

## 2021-12-30 DIAGNOSIS — M5412 Radiculopathy, cervical region: Secondary | ICD-10-CM

## 2021-12-30 MED ORDER — TIZANIDINE HCL 2 MG PO TABS: 2.0000 mg | ORAL_TABLET | Freq: Two times a day (BID) | ORAL | 5 refills | Status: DC | PRN

## 2021-12-30 NOTE — Progress Notes (Unsigned)
Primary Physician/Referring:  Vernie Shanks, MD  Patient ID: Richard Lynch, male    DOB: 03-02-1951, 71 y.o.   MRN: [id number]  No chief complaint on file.  HPI:    Richard Lynch  is a 71 y.o. hypertension, hyperlipidemia, obstructive sleep apnea on CPAP follows Dr. Maxwell Caul, medically managed mild PAD, paroxysmal atrial fibrillation with RVR on 12/17/2019 presenting to the ED SP cardioversion, chronic inflammatory demyelinating polyneuropathy (CIDP) and received immunotherapy and also steroid therapy in the past.   Patient had been seen in our office in 10/2019 and was lost to follow-up until last office visit 08/03/2021 when he presented to reestablish care for paroxysmal atrial fibrillation.  On last office visit patient was stable from a cardiovascular standpoint and doing well with rate control strategy for atrial fibrillation with daily metoprolol and as needed diltiazem.  However patient presents for urgent visit at his request today with concerns of 2 episodes suggestive of A. fib in the last 2 weeks, as well as complaints of "dizziness and unsteadiness".  Patient reports 2 episodes of palpitations and jaw pain in the last 2 weeks similar to previous A. fib episodes, he took diltiazem which seemed to help, however he noticed during 1 of these episodes his blood pressure as low as 80/60 mmHg.  He also reports daily episodes of dizziness primarily when he wakes up in the morning.  Dizziness is not positional but rather constant and patient states he feels "unsteady when walking".  Denies chest pain, syncope, near syncope.  Past Medical History:  Diagnosis Date   Atrial fibrillation ([place])    Constipation due to opioid therapy 12/22/2015   Emphysema lung ([place])    Gait abnormality 02/20/2017   GERD (gastroesophageal reflux disease)    High cholesterol    Hypertension    OSA (obstructive sleep apnea)    on CPAP   Primary localized osteoarthritis of right knee 12/08/2015   Septic  olecranon bursitis 06/2015   MSSA   Past Surgical History:  Procedure Laterality Date   COLONOSCOPY W/ BIOPSIES AND POLYPECTOMY     ELBOW SURGERY  08/2015   bilateral   EYE SURGERY     'repair of tear to left eye from pliers'   HAND SURGERY Left 08/2018   Carpal Tunnel Surgery- [place]  07/2010   KNEE SURGERY     3 times on both knee's each   MULTIPLE TOOTH EXTRACTIONS     SHOULDER ARTHROSCOPY     TOTAL HIP ARTHROPLASTY  08/2010   TOTAL KNEE ARTHROPLASTY Right 12/20/2015   TOTAL KNEE ARTHROPLASTY Right 12/20/2015   Procedure: TOTAL KNEE ARTHROPLASTY;  Surgeon: [name], MD;  Location: [place];  Service: Orthopedics;  Laterality: Right;   Family History  Problem Relation Age of Onset   Emphysema Father    Heart disease Father    Heart disease Mother    Colon cancer Mother    Breast cancer Mother     Social History   Tobacco Use   Smoking status: Every Day    Packs/day: 1.00    Types: Cigarettes    Start date: 01/24/1973   Smokeless tobacco: Never   Tobacco comments:    1 pack daily  Substance Use Topics   Alcohol use: Yes    Alcohol/week: 4.0 standard drinks    Types: 2 Cans of beer, 2 Standard drinks or equivalent per week    Comment: daily   Marital Status: Divorced  ROS  Review of Systems  Cardiovascular:  Positive for dyspnea on exertion (chronic, stable) and palpitations. Negative for chest pain, claudication, leg swelling, near-syncope, orthopnea, paroxysmal nocturnal dyspnea and syncope.  Neurological:  Positive for dizziness and paresthesias (hands).   Objective  There were no vitals taken for this visit.  Vitals with BMI 12/30/2021 11/22/2021 08/03/2021  Height '5\' 10"'$  - '5\' 10"'$   Weight 190 lbs 192 lbs 185 lbs  BMI 19.41 - 74.08  Systolic 144 818 563  Diastolic 66 77 82  Pulse 86 86 68    No data found.    Physical Exam Vitals reviewed.  Cardiovascular:     Rate and Rhythm: Normal rate and regular rhythm. Occasional  Extrasystoles are present.    Pulses:          Carotid pulses are 2+ on the right side and 2+ on the left side with bruit.      Femoral pulses are 2+ on the right side and 2+ on the left side.      Dorsalis pedis pulses are 2+ on the right side and 0 on the left side.       Posterior tibial pulses are 1+ on the right side and 0 on the left side.     Heart sounds: Normal heart sounds, S1 normal and S2 normal. No murmur heard.   No gallop.  Pulmonary:     Effort: Pulmonary effort is normal. No respiratory distress.     Breath sounds: No wheezing, rhonchi or rales.     Comments: Decreased air movement left base  Musculoskeletal:     Right lower leg: No edema.     Left lower leg: No edema.  Neurological:     Mental Status: He is alert.   Laboratory examination:   Recent Labs    06/09/21 0040  NA 139  K 4.0  CL 100  CO2 26  GLUCOSE 136*  BUN 19  CREATININE 1.09  CALCIUM 8.9  GFRNONAA >60    CrCl cannot be calculated (Patient's most recent lab result is older than the maximum 21 days allowed.).  CMP Latest Ref Rng & Units 06/09/2021 10/07/2020 12/17/2019  Glucose 70 - 99 mg/dL 136(H) 106(H) 114(H)  BUN 8 - 23 mg/dL 19 28(H) 35(H)  Creatinine 0.61 - 1.24 mg/dL 1.09 1.26 2.15(H)  Sodium 135 - 145 mmol/L 139 138 138  Potassium 3.5 - 5.1 mmol/L 4.0 3.8 3.8  Chloride 98 - 111 mmol/L 100 100 110  CO2 22 - 32 mmol/L '26 21 22  '$ Calcium 8.9 - 10.3 mg/dL 8.9 8.8 9.0  Total Protein 6.5 - 8.1 g/dL 7.7 - -  Total Bilirubin 0.3 - 1.2 mg/dL 0.8 - -  Alkaline Phos 38 - 126 U/L 88 - -  AST 15 - 41 U/L 26 - -  ALT 0 - 44 U/L 24 - -   CBC Latest Ref Rng & Units 06/09/2021 12/17/2019 12/23/2015  WBC 4.0 - 10.5 K/uL 15.8(H) 11.6(H) 12.1(H)  Hemoglobin 13.0 - 17.0 g/dL 13.9 12.3(L) 9.1(L)  Hematocrit 39.0 - 52.0 % 40.7 38.2(L) 27.3(L)  Platelets 150 - 400 K/uL 284 295 306   Recent Labs    06/09/21 0303  TSH 1.106    External labs:  07/28/2021: HDL 46, LDL 60, total cholesterol 130,  triglycerides 136,  A1c 5.5% BUN 18, creatinine 0.92, GFR >60 TSH 1.21  Cholesterol, total 103.000 m 05/27/2020 HDL 49.000 mg 05/27/2020 LDL-C 39.000 mg 05/27/2020 Triglycerides 65.000 mg 05/27/2020  Hemoglobin 13.100 g/d 05/27/2020  Platelets 295.000 K/ 12/17/2019  Creatinine, Serum 1.140 mg/ 05/27/2020 Potassium 4.100 mm 05/27/2020 ALT (SGPT) 17.000 U/L 05/27/2020    Glucose Random 81.000 02/27/2020 BUN 25.000 02/27/2020 Creatinine, Serum 1.160 02/27/2020  Cholesterol, total 128.000 12/10/2019 Triglycerides 132.000 12/10/2019 HDL 51.000 12/10/2019 LDL 54.000 12/10/2019  Hemoglobin 12.300 12/17/2019; Platelets 295.000 12/17/2019  Creatinine, Serum 2.150 12/17/2019 Potassium 3.800 12/17/2019 ALT (SGPT) 21.000 12/10/2019  TSH 2.061 12/17/2019; A1C 5.500 12/10/2019  Allergies  No Known Allergies   Medications Prior to Visit:   Outpatient Medications Prior to Visit  Medication Sig Dispense Refill   allopurinol (ZYLOPRIM) 100 MG tablet Take 100 mg by mouth daily.     amphetamine-dextroamphetamine (ADDERALL) 20 MG tablet Take 20-30 mg by mouth See admin instructions. Take 1 tablet every morning then take 1 and 1/2 tablets in the afternoon if needed     apixaban (ELIQUIS) 5 MG TABS tablet Take 5 mg by mouth 2 (two) times daily.     Ascorbic Acid (VITAMIN C) 100 MG tablet Take 100 mg by mouth daily.     atorvastatin (LIPITOR) 40 MG tablet Take 40 mg by mouth daily.     B Complex-C (SUPER B COMPLEX PO) Take by mouth.     candesartan (ATACAND) 32 MG tablet Take 32 mg by mouth daily.      chlorthalidone (HYGROTON) 25 MG tablet TAKE 1/2 TO 1 TABLET BY MOUTH DAILY. 90 tablet 3   Coenzyme Q10 (COQ-10 PO) Take 300 mg by mouth daily.     diclofenac Sodium (VOLTAREN) 1 % GEL Apply 4 g topically 4 (four) times daily. 100 g 0   diltiazem (CARDIZEM) 30 MG tablet Take 1 tablet every 4 hours AS NEEDED for AFIB heart rate >100 45 tablet 1   gabapentin (NEURONTIN) 300 MG capsule Take 2 tablets 3 times a day 540 capsule  3   metoprolol succinate (TOPROL-XL) 50 MG 24 hr tablet Take 1 tablet (50 mg total) by mouth daily. Take with or immediately following a meal. 90 tablet 3   Multiple Vitamin (MULTIVITAMIN WITH MINERALS) TABS tablet Take 1 tablet by mouth daily.     SYMBICORT 160-4.5 MCG/ACT inhaler Inhale into the lungs.     tiZANidine (ZANAFLEX) 2 MG tablet Take 1 tablet (2 mg total) by mouth 2 (two) times daily as needed for muscle spasms. 60 tablet 5   Turmeric (QC TUMERIC COMPLEX PO) Take 1,500 mg by mouth daily.     valACYclovir (VALTREX) 500 MG tablet Take 500 mg by mouth daily.     Zinc Sulfate (ZINC 15 PO) Take by mouth.     No facility-administered medications prior to visit.   Final Medications at End of Visit    No outpatient medications have been marked as taking for the 01/04/22 encounter (Appointment) with Alethia Berthold, PA-C.   PRIVIGEN Every 3 weeks infusion by Neurology for  CIDP   Radiology:   Low dose CT Chest 08/11/2019: 1. Lung-RADS 2, benign appearance or behavior. Continue annual screening with low-dose chest CT without contrast in 12 months. 2. Two-vessel coronary atherosclerosis. 3. Aortic Atherosclerosis   Cardiac Studies:   Ultrasound arterial segmental pressure and exercise ABI 06/11/2017: Ankle-brachial indices and waveforms are normal at rest. Decreased ankle-brachial indices immediately after exercise, right side greater than left. ABIs returned to baseline within 2-3 minutes. Post exercise study is suggestive for mild underlying peripheral vascular disease, particularly in the right lower Extremity.  Right ABI: 0.68, one minute after exercise. 0.97, three minutes after exercise. Left  ABI: 0.85, one minute after exercise. 1.09, two minutes after exercise.  Carotid artery duplex 08/23/2017: Minimal stenosis in the bilateral internal carotid artery (minimal).  Minimal stenosis of the left common  carotid artery. Antegrade right vertebral artery flow. Antegrade left  vertebral artery flow. F/U if clinically indicated.  Lexiscan sestamibi stress test 08/17/2017: 1. The exercise tolerance was not assessed due to pharmacologic stress. Resting EKG: Normal sinus rhythm, RBBB.  Stress EKG: Nondiagnostic for ischemia as a pharmacologic stress test. Stress symptoms included dyspnea. 2. Normal SPECT study with normal left ventricular systolic function. Low risk study.  Echocardiogram 12/31/2019:  1. Left ventricular ejection fraction, by estimation, is 60 to 65%. The  left ventricle has normal function. The left ventricle has no regional wall motion abnormalities. Left ventricular diastolic parameters were normal.  2. Right ventricular systolic function is normal. The right ventricular size is normal. Tricuspid regurgitation signal is inadequate for assessing  PA pressure.  3. Left atrial size was mildly dilated.  4. No significant change from 08/24/2017.  Abdominal Aortic Duplex  01/28/2020:  Mild heterogeneous plaque is noted in the mid aorta. No AAA observed.  Normal iliac artery velocity and no e/o hemodynamically significant stenosis.     EKG  11/22/2021: Sinus rhythm with single PAC at a rate of 80 bpm.  Normal axis.  Nonspecific T wave abnormality.  No evidence of ischemia or underlying injury pattern.  08/03/2021: Sinus rhythm at a rate of 69 bpm.  Normal axis.  Left atrial lodgment.  No evidence of ischemia or underlying injury pattern.  09/22/2020: Normal sinus rhythm with rate of 68 bpm, left atrial enlargement, normal axis.  No evidence of ischemia.   01/12/2020: Normal sinus rhythm at rate of 67 bpm, normal axis.  No evidence of ischemia, normal EKG.    Assessment    No diagnosis found.    CHA2DS2-VASc Score is 3.  Yearly risk of stroke: 3.2% (A, HTN, vasc dz).  Score of 1=0.6; 2=2.2; 3=3.2; 4=4.8; 5=7.2; 6=9.8; 7=>9.8) -(CHF; HTN; vasc disease DM,  Male = 1; Age <65 =0; 65-74 = 1,  >75 =2; stroke/embolism= 2).   No orders of the defined  types were placed in this encounter.   There are no discontinued medications.   No orders of the defined types were placed in this encounter.     Recommendations:   Richard Lynch  is a 71 y.o. hypertension, hyperlipidemia, obstructive sleep apnea on CPAP follows Dr. Maxwell Caul, medically managed mild PAD, paroxysmal atrial fibrillation with RVR on 12/17/2019 presenting to the ED SP cardioversion, chronic inflammatory demyelinating polyneuropathy (CIDP) and received immunotherapy and also steroid therapy in the past.   Patient had been seen in our office in 10/2019 and was lost to follow-up until last office visit 08/03/2021 when he presented to reestablish care for paroxysmal atrial fibrillation.  On last office visit patient was stable from a cardiovascular standpoint and doing well with rate control strategy for atrial fibrillation with daily metoprolol and as needed diltiazem.  However patient presents for urgent visit at his request today with concerns of 2 episodes suggestive of A. fib in the last 2 weeks, as well as complaints of "dizziness and unsteadiness". Patient has had more frequent episodes concerning for atrial fibrillation.   Patient continues to tolerate anticoagulation without bleeding diathesis.  EKG today reveals normal sinus rhythm with PACs, which patient has a known history of.  Given history of with PACs and atrial fibrillation, shared decision was to proceed with  2-week amatory cardiac telemetry to evaluate underlying A. fib versus PAC/PVC burden.  We will consider up titration of AV nodal blocking agents versus initiation of antiarrhythmic therapy pending cardiac monitor results.  In regard to blood pressure, advised patient to monitor blood pressure on a daily basis and bring a written log to his next appointment.  Will not make changes to medications at this time.  Follow-up in 6 weeks, sooner if needed, for atrial fibrillation and blood pressure management.   Alethia Berthold, PA-C 12/30/2021, 12:58 PM Office: [phone]

## 2021-12-30 NOTE — Patient Instructions (Addendum)
Please call Richard Lynch at  [phone] who is a massage therapist in [place] for local options ? ?Continue gabapentin '1200mg'$  at bedtime ? ?Start tizanidine '2mg'$  twice daily as needed ? ?Return to clinic in September/October ?

## 2022-01-04 ENCOUNTER — Encounter: Payer: Self-pay | Admitting: Student

## 2022-01-04 ENCOUNTER — Ambulatory Visit: Payer: Medicare Other | Admitting: Student

## 2022-01-04 ENCOUNTER — Other Ambulatory Visit: Payer: Self-pay

## 2022-01-04 ENCOUNTER — Ambulatory Visit: Payer: Medicare Other

## 2022-01-04 VITALS — BP 144/78 | HR 82 | Temp 97.9°F | Resp 16 | Ht 70.0 in | Wt 191.6 lb

## 2022-01-04 DIAGNOSIS — I493 Ventricular premature depolarization: Secondary | ICD-10-CM | POA: Diagnosis not present

## 2022-01-04 DIAGNOSIS — I480 Paroxysmal atrial fibrillation: Secondary | ICD-10-CM

## 2022-01-04 DIAGNOSIS — R9431 Abnormal electrocardiogram [ECG] [EKG]: Secondary | ICD-10-CM

## 2022-01-04 DIAGNOSIS — I491 Atrial premature depolarization: Secondary | ICD-10-CM | POA: Diagnosis not present

## 2022-01-10 NOTE — Progress Notes (Signed)
Called and spoke with patient regarding his echocardiogram results.  ?

## 2022-01-12 ENCOUNTER — Other Ambulatory Visit: Payer: Self-pay | Admitting: Cardiology

## 2022-01-12 DIAGNOSIS — I480 Paroxysmal atrial fibrillation: Secondary | ICD-10-CM

## 2022-01-30 ENCOUNTER — Other Ambulatory Visit: Payer: Medicare Other

## 2022-02-01 ENCOUNTER — Ambulatory Visit: Payer: Medicare Other | Admitting: Student

## 2022-02-01 ENCOUNTER — Other Ambulatory Visit: Payer: Self-pay | Admitting: *Deleted

## 2022-02-01 DIAGNOSIS — Z87891 Personal history of nicotine dependence: Secondary | ICD-10-CM

## 2022-02-01 DIAGNOSIS — Z122 Encounter for screening for malignant neoplasm of respiratory organs: Secondary | ICD-10-CM

## 2022-02-01 DIAGNOSIS — F17210 Nicotine dependence, cigarettes, uncomplicated: Secondary | ICD-10-CM

## 2022-02-06 ENCOUNTER — Other Ambulatory Visit: Payer: Medicare Other

## 2022-02-13 DIAGNOSIS — Z20822 Contact with and (suspected) exposure to covid-19: Secondary | ICD-10-CM | POA: Diagnosis not present

## 2022-02-15 ENCOUNTER — Ambulatory Visit: Payer: Medicare Other

## 2022-02-15 DIAGNOSIS — I493 Ventricular premature depolarization: Secondary | ICD-10-CM | POA: Diagnosis not present

## 2022-02-15 DIAGNOSIS — I480 Paroxysmal atrial fibrillation: Secondary | ICD-10-CM | POA: Diagnosis not present

## 2022-02-15 DIAGNOSIS — R9431 Abnormal electrocardiogram [ECG] [EKG]: Secondary | ICD-10-CM | POA: Diagnosis not present

## 2022-02-16 ENCOUNTER — Ambulatory Visit
Admission: RE | Admit: 2022-02-16 | Discharge: 2022-02-16 | Disposition: A | Discharge status: Home / Self Care | Payer: Medicare Other | Source: Ambulatory Visit | Attending: Acute Care | Admitting: Acute Care

## 2022-02-16 DIAGNOSIS — J432 Centrilobular emphysema: Secondary | ICD-10-CM | POA: Diagnosis not present

## 2022-02-16 DIAGNOSIS — Z87891 Personal history of nicotine dependence: Secondary | ICD-10-CM

## 2022-02-16 DIAGNOSIS — F17210 Nicotine dependence, cigarettes, uncomplicated: Secondary | ICD-10-CM

## 2022-02-16 DIAGNOSIS — I700 Atherosclerosis of aorta: Secondary | ICD-10-CM | POA: Diagnosis not present

## 2022-02-16 DIAGNOSIS — I2510 Atherosclerotic heart disease of native coronary artery without angina pectoris: Secondary | ICD-10-CM | POA: Diagnosis not present

## 2022-02-16 DIAGNOSIS — Z122 Encounter for screening for malignant neoplasm of respiratory organs: Secondary | ICD-10-CM

## 2022-02-17 ENCOUNTER — Telehealth: Payer: Self-pay

## 2022-02-17 ENCOUNTER — Other Ambulatory Visit: Payer: Self-pay

## 2022-02-17 DIAGNOSIS — Z122 Encounter for screening for malignant neoplasm of respiratory organs: Secondary | ICD-10-CM

## 2022-02-17 DIAGNOSIS — F17210 Nicotine dependence, cigarettes, uncomplicated: Secondary | ICD-10-CM

## 2022-02-17 DIAGNOSIS — Z87891 Personal history of nicotine dependence: Secondary | ICD-10-CM

## 2022-02-17 NOTE — Telephone Encounter (Signed)
Pt rec'vd mychart result message and had some concerns, has additional questions. Ask if you could call when you have a chance or would you like for me to move appt up. He said he could not figure out how to send Richard Lynch. Was only showing one of his dr's ?

## 2022-02-17 NOTE — Telephone Encounter (Signed)
You can move appt up so I can discuss further with him. Can see him next week

## 2022-02-24 DIAGNOSIS — Z20822 Contact with and (suspected) exposure to covid-19: Secondary | ICD-10-CM | POA: Diagnosis not present

## 2022-02-27 DIAGNOSIS — J189 Pneumonia, unspecified organism: Secondary | ICD-10-CM | POA: Diagnosis not present

## 2022-03-19 ENCOUNTER — Other Ambulatory Visit: Payer: Self-pay | Admitting: Student

## 2022-03-19 DIAGNOSIS — I10 Essential (primary) hypertension: Secondary | ICD-10-CM

## 2022-03-23 DIAGNOSIS — I480 Paroxysmal atrial fibrillation: Secondary | ICD-10-CM | POA: Diagnosis not present

## 2022-03-23 DIAGNOSIS — E7800 Pure hypercholesterolemia, unspecified: Secondary | ICD-10-CM | POA: Diagnosis not present

## 2022-03-23 DIAGNOSIS — W57XXXA Bitten or stung by nonvenomous insect and other nonvenomous arthropods, initial encounter: Secondary | ICD-10-CM | POA: Diagnosis not present

## 2022-03-23 DIAGNOSIS — E782 Mixed hyperlipidemia: Secondary | ICD-10-CM | POA: Diagnosis not present

## 2022-03-23 DIAGNOSIS — F17200 Nicotine dependence, unspecified, uncomplicated: Secondary | ICD-10-CM | POA: Diagnosis not present

## 2022-03-23 DIAGNOSIS — M19031 Primary osteoarthritis, right wrist: Secondary | ICD-10-CM | POA: Diagnosis not present

## 2022-03-23 DIAGNOSIS — S30860A Insect bite (nonvenomous) of lower back and pelvis, initial encounter: Secondary | ICD-10-CM | POA: Diagnosis not present

## 2022-03-23 DIAGNOSIS — M1712 Unilateral primary osteoarthritis, left knee: Secondary | ICD-10-CM | POA: Diagnosis not present

## 2022-03-23 DIAGNOSIS — K219 Gastro-esophageal reflux disease without esophagitis: Secondary | ICD-10-CM | POA: Diagnosis not present

## 2022-03-23 DIAGNOSIS — J439 Emphysema, unspecified: Secondary | ICD-10-CM | POA: Diagnosis not present

## 2022-03-23 DIAGNOSIS — E790 Hyperuricemia without signs of inflammatory arthritis and tophaceous disease: Secondary | ICD-10-CM | POA: Diagnosis not present

## 2022-03-23 DIAGNOSIS — I10 Essential (primary) hypertension: Secondary | ICD-10-CM | POA: Diagnosis not present

## 2022-04-06 ENCOUNTER — Encounter: Payer: Self-pay | Admitting: Student

## 2022-04-06 ENCOUNTER — Ambulatory Visit: Payer: Medicare Other | Admitting: Student

## 2022-04-06 VITALS — BP 131/71 | HR 86 | Temp 98.1°F | Resp 16 | Ht 70.0 in | Wt 191.0 lb

## 2022-04-06 DIAGNOSIS — I493 Ventricular premature depolarization: Secondary | ICD-10-CM

## 2022-04-06 DIAGNOSIS — R9439 Abnormal result of other cardiovascular function study: Secondary | ICD-10-CM

## 2022-04-06 DIAGNOSIS — I491 Atrial premature depolarization: Secondary | ICD-10-CM | POA: Diagnosis not present

## 2022-04-06 NOTE — Progress Notes (Signed)
Primary Physician/Referring:  Vernie Shanks, MD  Patient ID: Richard Lynch, male    DOB: [birth year], 70 y.o.   MRN: [id number]  Chief Complaint  Patient presents with   Results    Cardiac tests   Atrial Fibrillation   Hypertension   HPI:    Richard Lynch  is a 71 y.o. hypertension, hyperlipidemia, obstructive sleep apnea on CPAP follows Dr. Maxwell Caul, medically managed mild PAD, paroxysmal atrial fibrillation with RVR on 12/17/2019 presenting to the ED SP cardioversion, chronic inflammatory demyelinating polyneuropathy (CIDP) and received immunotherapy and also steroid therapy in the past.   Patient was last seen in the office 01/04/2022 at which time ordered echocardiogram and stress test given frequent ectopy on cardiac monitor.  Stress test revealed hypertensive response to exercise stress, EKG equivocal for ischemia, and myocardial perfusion imaging with possible ischemia in RCA distribution.  Echocardiogram revealed LVEF of 60-65%, otherwise unremarkable.  Patient has continued to have occasional episodes of lightheadedness and fatigue during which is watch notifies him of "A-fib".  Denies syncope.  Patient's blood pressure remains labile.  He does continue to complain of reduced exercise tolerance.  Denies chest pain.  Past Medical History:  Diagnosis Date   Atrial fibrillation ([place])    Constipation due to opioid therapy 12/22/2015   Emphysema lung ([place])    Gait abnormality 02/20/2017   GERD (gastroesophageal reflux disease)    High cholesterol    Hypertension    OSA (obstructive sleep apnea)    on CPAP   Primary localized osteoarthritis of right knee 12/08/2015   Septic olecranon bursitis 06/2015   MSSA   Past Surgical History:  Procedure Laterality Date   COLONOSCOPY W/ BIOPSIES AND POLYPECTOMY     ELBOW SURGERY  08/2015   bilateral   EYE SURGERY     'repair of tear to left eye from pliers'   HAND SURGERY Left 08/2018   Carpal Tunnel Surgery- [place]   07/2010   KNEE SURGERY     3 times on both knee's each   MULTIPLE TOOTH EXTRACTIONS     SHOULDER ARTHROSCOPY     TOTAL HIP ARTHROPLASTY  08/2010   TOTAL KNEE ARTHROPLASTY Right 12/20/2015   TOTAL KNEE ARTHROPLASTY Right 12/20/2015   Procedure: TOTAL KNEE ARTHROPLASTY;  Surgeon: [name], MD;  Location: [facility];  Service: Orthopedics;  Laterality: Right;   Family History  Problem Relation Age of Onset   Heart disease Mother    Colon cancer Mother    Breast cancer Mother    Emphysema Father    Heart disease Father     Social History   Tobacco Use   Smoking status: Every Day    Packs/day: 1.00    Years: 50.00    Total pack years: 50.00    Types: Cigarettes    Start date: 01/24/1973   Smokeless tobacco: Never   Tobacco comments:    1 pack daily  Substance Use Topics   Alcohol use: Yes    Alcohol/week: 4.0 standard drinks of alcohol    Types: 2 Cans of beer, 2 Standard drinks or equivalent per week    Comment: daily   Marital Status: Divorced  ROS  Review of Systems  Cardiovascular:  Positive for dyspnea on exertion (chronic, stable) and palpitations. Negative for chest pain, claudication, leg swelling, near-syncope, orthopnea, paroxysmal nocturnal dyspnea and syncope.  Neurological:  Positive for dizziness and paresthesias (hands).   Objective  Blood pressure 131/71, pulse 86,  temperature 98.1 F (36.7 C), temperature source Temporal, resp. rate 16, height '5\' 10"'$  (1.778 m), weight 191 lb (86.6 kg), SpO2 95 %.     04/06/2022   10:36 AM 01/04/2022   10:36 AM 12/30/2021   10:28 AM  Vitals with BMI  Height '5\' 10"'$  '5\' 10"'$  '5\' 10"'$   Weight 191 lbs 191 lbs 10 oz 190 lbs  BMI 27.41 76.16 07.37  Systolic 106 269 485  Diastolic 71 78 66  Pulse 86 82 86    Orthostatic VS for the past 72 hrs (Last 3 readings):  Patient Position BP Location Cuff Size  04/06/22 1036 Sitting Left Arm Normal    Physical Exam Vitals reviewed.  Cardiovascular:     Rate and Rhythm: Normal rate and  regular rhythm. No extrasystoles are present.    Pulses:          Carotid pulses are 2+ on the right side and 2+ on the left side with bruit.      Femoral pulses are 2+ on the right side and 2+ on the left side.      Dorsalis pedis pulses are 2+ on the right side and 0 on the left side.       Posterior tibial pulses are 1+ on the right side and 0 on the left side.     Heart sounds: Normal heart sounds, S1 normal and S2 normal. No murmur heard.    No gallop.  Pulmonary:     Effort: Pulmonary effort is normal. No respiratory distress.     Breath sounds: No wheezing, rhonchi or rales.     Comments: Decreased air movement left base  Musculoskeletal:     Right lower leg: No edema.     Left lower leg: No edema.  Neurological:     Mental Status: He is alert.      Laboratory examination:   Recent Labs    06/09/21 0040  NA 139  K 4.0  CL 100  CO2 26  GLUCOSE 136*  BUN 19  CREATININE 1.09  CALCIUM 8.9  GFRNONAA >60   CrCl cannot be calculated (Patient's most recent lab result is older than the maximum 21 days allowed.).     Latest Ref Rng & Units 06/09/2021   12:40 AM 10/07/2020    3:15 PM 12/17/2019    2:11 AM  CMP  Glucose 70 - 99 mg/dL 136  106  114   BUN 8 - 23 mg/dL 19  28  35   Creatinine 0.61 - 1.24 mg/dL 1.09  1.26  2.15   Sodium 135 - 145 mmol/L 139  138  138   Potassium 3.5 - 5.1 mmol/L 4.0  3.8  3.8   Chloride 98 - 111 mmol/L 100  100  110   CO2 22 - 32 mmol/L '26  21  22   '$ Calcium 8.9 - 10.3 mg/dL 8.9  8.8  9.0   Total Protein 6.5 - 8.1 g/dL 7.7     Total Bilirubin 0.3 - 1.2 mg/dL 0.8     Alkaline Phos 38 - 126 U/L 88     AST 15 - 41 U/L 26     ALT 0 - 44 U/L 24         Latest Ref Rng & Units 06/09/2021   12:40 AM 12/17/2019    2:11 AM 12/23/2015    4:00 AM  CBC  WBC 4.0 - 10.5 K/uL 15.8  11.6  12.1   Hemoglobin 13.0 - 17.0  g/dL 13.9  12.3  9.1   Hematocrit 39.0 - 52.0 % 40.7  38.2  27.3   Platelets 150 - 400 K/uL 284  295  306    Recent Labs     06/09/21 0303  TSH 1.106   External labs:  07/28/2021: HDL 46, LDL 60, total cholesterol 130, triglycerides 136,  A1c 5.5% BUN 18, creatinine 0.92, GFR >60 TSH 1.21  Cholesterol, total 103.000 m 05/27/2020 HDL 49.000 mg 05/27/2020 LDL-C 39.000 mg 05/27/2020 Triglycerides 65.000 mg 05/27/2020  Hemoglobin 13.100 g/d 05/27/2020 Platelets 295.000 K/ 12/17/2019  Creatinine, Serum 1.140 mg/ 05/27/2020 Potassium 4.100 mm 05/27/2020 ALT (SGPT) 17.000 U/L 05/27/2020    Glucose Random 81.000 02/27/2020 BUN 25.000 02/27/2020 Creatinine, Serum 1.160 02/27/2020  Cholesterol, total 128.000 12/10/2019 Triglycerides 132.000 12/10/2019 HDL 51.000 12/10/2019 LDL 54.000 12/10/2019  Hemoglobin 12.300 12/17/2019; Platelets 295.000 12/17/2019  Creatinine, Serum 2.150 12/17/2019 Potassium 3.800 12/17/2019 ALT (SGPT) 21.000 12/10/2019  TSH 2.061 12/17/2019; A1C 5.500 12/10/2019  Allergies  No Known Allergies   Medications Prior to Visit:   Outpatient Medications Prior to Visit  Medication Sig Dispense Refill   allopurinol (ZYLOPRIM) 100 MG tablet Take 100 mg by mouth daily.     amphetamine-dextroamphetamine (ADDERALL) 20 MG tablet Take 20-30 mg by mouth See admin instructions. Take 1 tablet every morning then take 1 and 1/2 tablets in the afternoon if needed     Ascorbic Acid (VITAMIN C) 100 MG tablet Take 100 mg by mouth daily.     atorvastatin (LIPITOR) 40 MG tablet Take 40 mg by mouth daily.     B Complex-C (SUPER B COMPLEX PO) Take by mouth.     candesartan (ATACAND) 32 MG tablet Take 32 mg by mouth daily.      chlorthalidone (HYGROTON) 25 MG tablet Take 25 mg by mouth daily.     Coenzyme Q10 (COQ-10 PO) Take 300 mg by mouth daily.     diclofenac Sodium (VOLTAREN) 1 % GEL Apply 4 g topically 4 (four) times daily. 100 g 0   diltiazem (CARDIZEM) 30 MG tablet Take 1 tablet every 4 hours AS NEEDED for AFIB heart rate >100 45 tablet 1   ELIQUIS 5 MG TABS tablet TAKE 1 TABLET BY MOUTH TWICE A DAY 180 tablet 3    gabapentin (NEURONTIN) 300 MG capsule Take 2 tablets 3 times a day (Patient taking differently: Take 300 mg by mouth as directed. Take 2 tablets  in the morning, 2 in the evening and 4 at bedtime) 540 capsule 3   metoprolol succinate (TOPROL-XL) 50 MG 24 hr tablet Take 1 tablet (50 mg total) by mouth daily. Take with or immediately following a meal. 90 tablet 3   Multiple Vitamin (MULTIVITAMIN WITH MINERALS) TABS tablet Take 1 tablet by mouth daily.     Omega-3 Fatty Acids (FISH OIL) 1200 MG CAPS Take 1 capsule by mouth daily.     SYMBICORT 160-4.5 MCG/ACT inhaler Inhale into the lungs.     tiZANidine (ZANAFLEX) 2 MG tablet Take 1 tablet (2 mg total) by mouth 2 (two) times daily as needed for muscle spasms. 60 tablet 5   Turmeric (QC TUMERIC COMPLEX PO) Take 1,500 mg by mouth daily.     valACYclovir (VALTREX) 500 MG tablet Take 500 mg by mouth daily.     Zinc Sulfate (ZINC 15 PO) Take by mouth.     Ferrous Sulfate (IRON) 325 (65 Fe) MG TABS Take 1 tablet by mouth daily.     hydrochlorothiazide (HYDRODIURIL) 50 MG  tablet Take 1 tablet (50 mg total) by mouth daily. 90 tablet 0   Pitavastatin Calcium (LIVALO) 2 MG TABS Take 1 tablet by mouth daily.     No facility-administered medications prior to visit.   Final Medications at End of Visit    Current Meds  Medication Sig   allopurinol (ZYLOPRIM) 100 MG tablet Take 100 mg by mouth daily.   amphetamine-dextroamphetamine (ADDERALL) 20 MG tablet Take 20-30 mg by mouth See admin instructions. Take 1 tablet every morning then take 1 and 1/2 tablets in the afternoon if needed   Ascorbic Acid (VITAMIN C) 100 MG tablet Take 100 mg by mouth daily.   atorvastatin (LIPITOR) 40 MG tablet Take 40 mg by mouth daily.   B Complex-C (SUPER B COMPLEX PO) Take by mouth.   candesartan (ATACAND) 32 MG tablet Take 32 mg by mouth daily.    chlorthalidone (HYGROTON) 25 MG tablet Take 25 mg by mouth daily.   Coenzyme Q10 (COQ-10 PO) Take 300 mg by mouth daily.    diclofenac Sodium (VOLTAREN) 1 % GEL Apply 4 g topically 4 (four) times daily.   diltiazem (CARDIZEM) 30 MG tablet Take 1 tablet every 4 hours AS NEEDED for AFIB heart rate >100   ELIQUIS 5 MG TABS tablet TAKE 1 TABLET BY MOUTH TWICE A DAY   gabapentin (NEURONTIN) 300 MG capsule Take 2 tablets 3 times a day (Patient taking differently: Take 300 mg by mouth as directed. Take 2 tablets  in the morning, 2 in the evening and 4 at bedtime)   metoprolol succinate (TOPROL-XL) 50 MG 24 hr tablet Take 1 tablet (50 mg total) by mouth daily. Take with or immediately following a meal.   Multiple Vitamin (MULTIVITAMIN WITH MINERALS) TABS tablet Take 1 tablet by mouth daily.   Omega-3 Fatty Acids (FISH OIL) 1200 MG CAPS Take 1 capsule by mouth daily.   SYMBICORT 160-4.5 MCG/ACT inhaler Inhale into the lungs.   tiZANidine (ZANAFLEX) 2 MG tablet Take 1 tablet (2 mg total) by mouth 2 (two) times daily as needed for muscle spasms.   Turmeric (QC TUMERIC COMPLEX PO) Take 1,500 mg by mouth daily.   valACYclovir (VALTREX) 500 MG tablet Take 500 mg by mouth daily.   Zinc Sulfate (ZINC 15 PO) Take by mouth.   PRIVIGEN Every 3 weeks infusion by Neurology for  CIDP   Radiology:   Low dose CT Chest 08/11/2019: 1. Lung-RADS 2, benign appearance or behavior. Continue annual screening with low-dose chest CT without contrast in 12 months. 2. Two-vessel coronary atherosclerosis. 3. Aortic Atherosclerosis   Cardiac Studies:   Ultrasound arterial segmental pressure and exercise ABI 06/11/2017: Ankle-brachial indices and waveforms are normal at rest. Decreased ankle-brachial indices immediately after exercise, right side greater than left. ABIs returned to baseline within 2-3 minutes. Post exercise study is suggestive for mild underlying peripheral vascular disease, particularly in the right lower Extremity.  Right ABI: 0.68, one minute after exercise. 0.97, three minutes after exercise. Left ABI: 0.85, one minute after  exercise. 1.09, two minutes after exercise.  Carotid artery duplex 08/23/2017: Minimal stenosis in the bilateral internal carotid artery (minimal).  Minimal stenosis of the left common  carotid artery. Antegrade right vertebral artery flow. Antegrade left vertebral artery flow. F/U if clinically indicated.  Abdominal Aortic Duplex  01/28/2020:  Mild heterogeneous plaque is noted in the mid aorta. No AAA observed.  Normal iliac artery velocity and no e/o hemodynamically significant stenosis.    Ambulatory cardiac telemetry 7 days (  11/22/2021 - 11/30/2021): Predominant underlying rhythm was sinus.  3 episodes of atrial tachycardia with the longest lasting 18 beats and maximum heart rate of 146 bpm.  Occasional PACs and PVCs.  Ventricular bigeminy and trigeminy were present.  Patient triggered events correlated with: Normal sinus rhythm, PACs, PVCs.  No evidence of atrial fibrillation, VT, pauses >3 seconds, or high degree AV block.  Echocardiogram 01/04/2022:  Normal LV systolic function with visual EF 60-65%. Left ventricle cavity  is normal in size. Normal left ventricular wall thickness. Normal global  wall motion. Normal diastolic filling pattern, normal LAP.  Mild pulmonic regurgitation.  Compared to study 12/31/2019 no significant change.   Exercise Myoview stress test 02/15/2022: Exercise nuclear stress test was performed using Bruce protocol.   1 Day Rest and Stress images. Exercise time 8 minutes 30 seconds, achieved 9.09 METS, 95% APMHR.  No chest pain during exercise. Hypertensive response to exercise (resting BP 122/78, peak BP 230/78). Stress ECG equivocal due to EKG changes (12m upsloping ST depression inferolateral leads which recovers 3 minutes into recovery, PAC/PVC).  Medium size, mild intensity, reversible perfusion defect involving basal to distal inferior segments suggestive of possible ischemia in the RCA distribution. Left ventricular size normal, LVEF 58%, no regional  wall motion abnormalities. Intermediate risk study, clinical correlation required.  EKG   11/22/2021: Sinus rhythm with single PAC at a rate of 80 bpm.  Normal axis.  Nonspecific T wave abnormality.  No evidence of ischemia or underlying injury pattern.  08/03/2021: Sinus rhythm at a rate of 69 bpm.  Normal axis.  Left atrial lodgment.  No evidence of ischemia or underlying injury pattern.  09/22/2020: Normal sinus rhythm with rate of 68 bpm, left atrial enlargement, normal axis.  No evidence of ischemia.   01/12/2020: Normal sinus rhythm at rate of 67 bpm, normal axis.  No evidence of ischemia, normal EKG.    Assessment    1. Abnormal stress test   2. PAC (premature atrial contraction)   3. PVC (premature ventricular contraction)      CHA2DS2-VASc Score is 3.  Yearly risk of stroke: 3.2% (A, HTN, vasc dz).  Score of 1=0.6; 2=2.2; 3=3.2; 4=4.8; 5=7.2; 6=9.8; 7=>9.8) -(CHF; HTN; vasc disease DM,  Male = 1; Age <65 =0; 65-74 = 1,  >75 =2; stroke/embolism= 2).   No orders of the defined types were placed in this encounter.   Medications Discontinued During This Encounter  Medication Reason   hydrochlorothiazide (HYDRODIURIL) 50 MG tablet    Ferrous Sulfate (IRON) 325 (65 Fe) MG TABS    Pitavastatin Calcium (LIVALO) 2 MG TABS      Orders Placed This Encounter  Procedures   Basic metabolic panel   CBC      Recommendations:   Richard Lynch is a 71y.o. hypertension, hyperlipidemia, obstructive sleep apnea on CPAP follows Dr. OMaxwell Caul medically managed mild PAD, paroxysmal atrial fibrillation with RVR on 12/17/2019 presenting to the ED SP cardioversion, chronic inflammatory demyelinating polyneuropathy (CIDP) and received immunotherapy and also steroid therapy in the past.   Patient was last seen in the office 01/04/2022 at which time ordered echocardiogram and stress test given frequent ectopy on cardiac monitor.  Stress test revealed hypertensive response to exercise stress, EKG  equivocal for ischemia, and myocardial perfusion imaging with possible ischemia in RCA distribution.  Echocardiogram revealed LVEF of 60-65%, otherwise unremarkable.  Given abnormal stress test as discussed above as well as continued reduced exercise tolerance and ectopy on monitor showed  decision was to proceed with left heart catheterization.  The left heart catheterization procedure was explained to the patient in detail. The indication, alternatives, risks and benefits were reviewed. Complications including but not limited to bleeding, infection, acute kidney injury, blood transfusion, heart rhythm disturbances, contrast (dye) reaction, damage to the arteries or nerves in the legs or hands, cerebrovascular accident, myocardial infarction, need for emergent bypass surgery, blood clots in the legs, possible need for emergent blood transfusion, and rarely death were reviewed and discussed with the patient. The patient voices understanding and wishes to proceed.  Also in regard to blood pressure management, patient's blood pressures remain labile.  We will enroll him in remote patient monitoring with this.  Follow up in 4-6 weeks.    Alethia Berthold, PA-C 04/06/2022, 11:38 AM Office: [phone]

## 2022-04-07 LAB — CBC
Hematocrit: 39.7 % (ref 37.5–51.0)
Hemoglobin: 14.2 g/dL (ref 13.0–17.7)
MCH: 34.2 pg — ABNORMAL HIGH (ref 26.6–33.0)
MCHC: 35.8 g/dL — ABNORMAL HIGH (ref 31.5–35.7)
MCV: 96 fL (ref 79–97)
Platelets: 280 10*3/uL (ref 150–450)
RBC: 4.15 x10E6/uL (ref 4.14–5.80)
RDW: 12.2 % (ref 11.6–15.4)
WBC: 10.7 10*3/uL (ref 3.4–10.8)

## 2022-04-07 LAB — BASIC METABOLIC PANEL
BUN/Creatinine Ratio: 23 (ref 10–24)
BUN: 23 mg/dL (ref 8–27)
CO2: 26 mmol/L (ref 20–29)
Calcium: 9.2 mg/dL (ref 8.6–10.2)
Chloride: 100 mmol/L (ref 96–106)
Creatinine, Ser: 1 mg/dL (ref 0.76–1.27)
Glucose: 128 mg/dL — ABNORMAL HIGH (ref 70–99)
Potassium: 4.3 mmol/L (ref 3.5–5.2)
Sodium: 143 mmol/L (ref 134–144)
eGFR: 81 mL/min/{1.73_m2} (ref >59)

## 2022-04-10 DIAGNOSIS — S20479A Other superficial bite of unspecified back wall of thorax, initial encounter: Secondary | ICD-10-CM | POA: Diagnosis not present

## 2022-04-10 DIAGNOSIS — W57XXXA Bitten or stung by nonvenomous insect and other nonvenomous arthropods, initial encounter: Secondary | ICD-10-CM | POA: Diagnosis not present

## 2022-04-11 ENCOUNTER — Ambulatory Visit (HOSPITAL_COMMUNITY)
Admission: RE | Admit: 2022-04-11 | Discharge: 2022-04-11 | Disposition: A | Discharge status: Home / Self Care | Payer: Medicare Other | Attending: Cardiology | Admitting: Cardiology

## 2022-04-11 ENCOUNTER — Other Ambulatory Visit: Payer: Self-pay

## 2022-04-11 ENCOUNTER — Encounter (HOSPITAL_COMMUNITY)
Admission: RE | Disposition: A | Discharge status: Home / Self Care | Payer: Self-pay | Source: Home / Self Care | Attending: Cardiology

## 2022-04-11 ENCOUNTER — Encounter (HOSPITAL_COMMUNITY): Payer: Self-pay | Admitting: Cardiology

## 2022-04-11 DIAGNOSIS — R0609 Other forms of dyspnea: Secondary | ICD-10-CM | POA: Insufficient documentation

## 2022-04-11 DIAGNOSIS — I491 Atrial premature depolarization: Secondary | ICD-10-CM | POA: Insufficient documentation

## 2022-04-11 DIAGNOSIS — R9430 Abnormal result of cardiovascular function study, unspecified: Secondary | ICD-10-CM | POA: Diagnosis present

## 2022-04-11 DIAGNOSIS — I480 Paroxysmal atrial fibrillation: Secondary | ICD-10-CM | POA: Insufficient documentation

## 2022-04-11 DIAGNOSIS — I25118 Atherosclerotic heart disease of native coronary artery with other forms of angina pectoris: Secondary | ICD-10-CM | POA: Insufficient documentation

## 2022-04-11 DIAGNOSIS — G6181 Chronic inflammatory demyelinating polyneuritis: Secondary | ICD-10-CM | POA: Insufficient documentation

## 2022-04-11 DIAGNOSIS — E785 Hyperlipidemia, unspecified: Secondary | ICD-10-CM | POA: Insufficient documentation

## 2022-04-11 DIAGNOSIS — I10 Essential (primary) hypertension: Secondary | ICD-10-CM | POA: Insufficient documentation

## 2022-04-11 DIAGNOSIS — F17210 Nicotine dependence, cigarettes, uncomplicated: Secondary | ICD-10-CM | POA: Insufficient documentation

## 2022-04-11 DIAGNOSIS — I2510 Atherosclerotic heart disease of native coronary artery without angina pectoris: Secondary | ICD-10-CM

## 2022-04-11 DIAGNOSIS — G4733 Obstructive sleep apnea (adult) (pediatric): Secondary | ICD-10-CM | POA: Diagnosis not present

## 2022-04-11 HISTORY — PX: LEFT HEART CATH AND CORONARY ANGIOGRAPHY: CATH118249

## 2022-04-11 SURGERY — LEFT HEART CATH AND CORONARY ANGIOGRAPHY
Anesthesia: LOCAL

## 2022-04-11 MED ORDER — FENTANYL CITRATE (PF) 100 MCG/2ML IJ SOLN
INTRAMUSCULAR | Status: AC
  Filled 2022-04-11: qty 2

## 2022-04-11 MED ORDER — ASPIRIN 81 MG PO CHEW
81.0000 mg | CHEWABLE_TABLET | ORAL | Status: AC
  Administered 2022-04-11: 81 mg via ORAL
  Filled 2022-04-11: qty 1

## 2022-04-11 MED ORDER — SODIUM CHLORIDE 0.9 % IV SOLN: 250.0000 mL | INTRAVENOUS | Status: DC | PRN

## 2022-04-11 MED ORDER — LIDOCAINE HCL (PF) 1 % IJ SOLN
INTRAMUSCULAR | Status: AC
  Filled 2022-04-11: qty 30

## 2022-04-11 MED ORDER — FENTANYL CITRATE (PF) 100 MCG/2ML IJ SOLN
INTRAMUSCULAR | Status: DC | PRN
Start: 2022-04-11 — End: 2022-04-11
  Administered 2022-04-11: 50 ug via INTRAVENOUS

## 2022-04-11 MED ORDER — MIDAZOLAM HCL 2 MG/2ML IJ SOLN
INTRAMUSCULAR | Status: AC
  Filled 2022-04-11: qty 2

## 2022-04-11 MED ORDER — IOHEXOL 350 MG/ML SOLN
INTRAVENOUS | Status: DC | PRN
  Administered 2022-04-11: 40 mL

## 2022-04-11 MED ORDER — SODIUM CHLORIDE 0.9% FLUSH: 3.0000 mL | Freq: Two times a day (BID) | INTRAVENOUS | Status: DC

## 2022-04-11 MED ORDER — SODIUM CHLORIDE 0.9 % WEIGHT BASED INFUSION: 1.0000 mL/kg/h | INTRAVENOUS | Status: DC

## 2022-04-11 MED ORDER — ISOSORBIDE MONONITRATE ER 30 MG PO TB24: 30.0000 mg | ORAL_TABLET | Freq: Every day | ORAL | 1 refills | Status: DC

## 2022-04-11 MED ORDER — MIDAZOLAM HCL 2 MG/2ML IJ SOLN
INTRAMUSCULAR | Status: DC | PRN
  Administered 2022-04-11: 1 mg via INTRAVENOUS

## 2022-04-11 MED ORDER — MULTAQ 400 MG PO TABS: 400.0000 mg | ORAL_TABLET | Freq: Two times a day (BID) | ORAL | 1 refills | Status: DC

## 2022-04-11 MED ORDER — SODIUM CHLORIDE 0.9% FLUSH: 3.0000 mL | INTRAVENOUS | Status: DC | PRN

## 2022-04-11 MED ORDER — HEPARIN SODIUM (PORCINE) 1000 UNIT/ML IJ SOLN
INTRAMUSCULAR | Status: AC
  Filled 2022-04-11: qty 10

## 2022-04-11 MED ORDER — HEPARIN (PORCINE) IN NACL 1000-0.9 UT/500ML-% IV SOLN
INTRAVENOUS | Status: AC
  Filled 2022-04-11: qty 1000

## 2022-04-11 MED ORDER — ASPIRIN 81 MG PO CHEW: 81.0000 mg | CHEWABLE_TABLET | ORAL | Status: DC

## 2022-04-11 MED ORDER — VERAPAMIL HCL 2.5 MG/ML IV SOLN
INTRAVENOUS | Status: AC
  Filled 2022-04-11: qty 2

## 2022-04-11 MED ORDER — ASPIRIN 81 MG PO TBEC: 81.0000 mg | DELAYED_RELEASE_TABLET | Freq: Every day | ORAL | 3 refills | Status: DC

## 2022-04-11 MED ORDER — VERAPAMIL HCL 2.5 MG/ML IV SOLN
INTRAVENOUS | Status: DC | PRN
  Administered 2022-04-11: 10 mL via INTRA_ARTERIAL

## 2022-04-11 MED ORDER — HEPARIN SODIUM (PORCINE) 1000 UNIT/ML IJ SOLN
INTRAMUSCULAR | Status: DC | PRN
  Administered 2022-04-11: 3000 [IU] via INTRAVENOUS

## 2022-04-11 MED ORDER — LIDOCAINE HCL (PF) 1 % IJ SOLN
INTRAMUSCULAR | Status: DC | PRN
  Administered 2022-04-11: 2 mL

## 2022-04-11 MED ORDER — SODIUM CHLORIDE 0.9 % WEIGHT BASED INFUSION
3.0000 mL/kg/h | INTRAVENOUS | Status: AC
  Administered 2022-04-11: 3 mL/kg/h via INTRAVENOUS

## 2022-04-11 MED ORDER — HEPARIN (PORCINE) IN NACL 1000-0.9 UT/500ML-% IV SOLN
INTRAVENOUS | Status: DC | PRN
  Administered 2022-04-11 (×2): 500 mL

## 2022-04-11 MED ORDER — APIXABAN 5 MG PO TABS: 5.0000 mg | ORAL_TABLET | Freq: Two times a day (BID) | ORAL | 3 refills | Status: DC

## 2022-04-11 SURGICAL SUPPLY — 12 items
BAND ZEPHYR COMPRESS 30 LONG (HEMOSTASIS) ×1 IMPLANT
CATH INFINITI JR4 5F (CATHETERS) ×1 IMPLANT
CATH OPTITORQUE TIG 4.0 5F (CATHETERS) ×1 IMPLANT
GLIDESHEATH SLEND A-KIT 6F 22G (SHEATH) ×1 IMPLANT
GLIDESHEATH SLEND SS 6F .021 (SHEATH) IMPLANT
GUIDEWIRE INQWIRE 1.5J.035X260 (WIRE) IMPLANT
INQWIRE 1.5J .035X260CM (WIRE) ×2
KIT HEART LEFT (KITS) ×2 IMPLANT
PACK CARDIAC CATHETERIZATION (CUSTOM PROCEDURE TRAY) ×2 IMPLANT
SHEATH PROBE COVER 6X72 (BAG) ×1 IMPLANT
TRANSDUCER W/STOPCOCK (MISCELLANEOUS) ×2 IMPLANT
TUBING CIL FLEX 10 FLL-RA (TUBING) ×3 IMPLANT

## 2022-04-11 NOTE — Interval H&P Note (Signed)
History and Physical Interval Note:  04/11/2022 7:39 AM  Richard Lynch  has presented today for surgery, with the diagnosis of abnormal stress test.  The various methods of treatment have been discussed with the patient and family. After consideration of risks, benefits and other options for treatment, the patient has consented to  Procedure(s): LEFT HEART CATH AND CORONARY ANGIOGRAPHY (N/A) as a surgical intervention.  The patient's history has been reviewed, patient examined, no change in status, stable for surgery.  I have reviewed the patient's chart and labs.  Questions were answered to the patient's satisfaction.    2016/2017 Appropriate Use Criteria for Coronary Revascularization Symptom Status: Ischemic Symptoms  Non-invasive Testing: Intermediate Risk  If no or indeterminate stress test, FFR/iFR results in all diseased vessels: N/A  Diabetes Mellitus: No  S/P CABG: No  Antianginal therapy (number of long-acting drugs): 1  Patient undergoing renal transplant: No  Patient undergoing percutaneous valve procedure: No  1 Vessel Disease PCI CABG  No proximal LAD involvement, No proximal left dominant LCX involvement M (6); Indication 2 M (4); Indication 2  Proximal left dominant LCX involvement A (7); Indication 5 A (7); Indication 5  Proximal LAD involvement A (7); Indication 5 A (7); Indication 5  2 Vessel Disease  No proximal LAD involvement A (7); Indication 8 M (6); Indication 8  Proximal LAD involvement A (7); Indication 11 A (7); Indication 11  3 Vessel Disease  Low disease complexity (e.g., focal stenoses, SYNTAX <=22) A (7); Indication 17 A (8); Indication 17  Intermediate or high disease complexity (e.g., SYNTAX >=23) M (6); Indication 21 A (8); Indication 21  Left Main Disease  Isolated LMCA disease: ostial or midshaft A (7); Indication 24 A (9); Indication 24  Isolated LMCA disease: bifurcation involvement M (5); Indication 25 A (9); Indication 25  LMCA ostial or  midshaft, concurrent low disease burden multivessel disease (e.g., 1-2 additional focal stenoses, SYNTAX <=22) A (7); Indication 26 A (9); Indication 26  LMCA ostial or midshaft, concurrent intermediate or high disease burden multivessel disease (e.g., 1-2 additional bifurcation stenoses, long stenoses, SYNTAX >=23) M (4); Indication 27 A (9); Indication 27  LMCA bifurcation involvement, concurrent low disease burden multivessel disease (e.g., 1-2 additional focal stenoses, SYNTAX <=22) M (5); Indication 28 A (9); Indication 28  LMCA bifurcation involvement, concurrent intermediate or high disease burden multivessel disease (e.g., 1-2 additional bifurcation stenoses, long stenoses, SYNTAX >=23) R (3); Indication 29 A (9); Indication [place]

## 2022-04-11 NOTE — H&P (Signed)
OV 04/06/2022 copied for documentation    Primary Physician/Referring:  Vernie Shanks, MD  Patient ID: Richard Lynch, male    DOB: 1951-04-12, 71 y.o.   MRN: [id number]  Chief Complaint  Patient presents with   Results    Cardiac tests   Atrial Fibrillation   Hypertension   HPI:    Richard Lynch  is a 71 y.o. hypertension, hyperlipidemia, obstructive sleep apnea on CPAP follows Dr. Maxwell Caul, medically managed mild PAD, paroxysmal atrial fibrillation with RVR on 12/17/2019 presenting to the ED SP cardioversion, chronic inflammatory demyelinating polyneuropathy (CIDP) and received immunotherapy and also steroid therapy in the past.   Patient was last seen in the office 01/04/2022 at which time ordered echocardiogram and stress test given frequent ectopy on cardiac monitor.  Stress test revealed hypertensive response to exercise stress, EKG equivocal for ischemia, and myocardial perfusion imaging with possible ischemia in RCA distribution.  Echocardiogram revealed LVEF of 60-65%, otherwise unremarkable.  Patient has continued to have occasional episodes of lightheadedness and fatigue during which is watch notifies him of "A-fib".  Denies syncope.  Patient's blood pressure remains labile.  He does continue to complain of reduced exercise tolerance.  Denies chest pain.  Past Medical History:  Diagnosis Date   Atrial fibrillation ([place])    Constipation due to opioid therapy 12/22/2015   Emphysema lung ([place])    Gait abnormality 02/20/2017   GERD (gastroesophageal reflux disease)    High cholesterol    Hypertension    OSA (obstructive sleep apnea)    on CPAP   Primary localized osteoarthritis of right knee 12/08/2015   Septic olecranon bursitis 06/2015   MSSA   Past Surgical History:  Procedure Laterality Date   COLONOSCOPY W/ BIOPSIES AND POLYPECTOMY     ELBOW SURGERY  08/2015   bilateral   EYE SURGERY     'repair of tear to left eye from pliers'   HAND SURGERY Left 08/2018   Carpal  Tunnel Surgery- [place]  07/2010   KNEE SURGERY     3 times on both knee's each   MULTIPLE TOOTH EXTRACTIONS     SHOULDER ARTHROSCOPY     TOTAL HIP ARTHROPLASTY  08/2010   TOTAL KNEE ARTHROPLASTY Right 12/20/2015   TOTAL KNEE ARTHROPLASTY Right 12/20/2015   Procedure: TOTAL KNEE ARTHROPLASTY;  Surgeon: [name], MD;  Location: [facility];  Service: Orthopedics;  Laterality: Right;   Family History  Problem Relation Age of Onset   Heart disease Mother    Colon cancer Mother    Breast cancer Mother    Emphysema Father    Heart disease Father     Social History   Tobacco Use   Smoking status: Every Day    Packs/day: 1.00    Years: 50.00    Total pack years: 50.00    Types: Cigarettes    Start date: 01/24/1973   Smokeless tobacco: Never   Tobacco comments:    1 pack daily  Substance Use Topics   Alcohol use: Yes    Alcohol/week: 4.0 standard drinks of alcohol    Types: 2 Cans of beer, 2 Standard drinks or equivalent per week    Comment: daily   Marital Status: Divorced  ROS  Review of Systems  Cardiovascular:  Positive for dyspnea on exertion (chronic, stable) and palpitations. Negative for chest pain, claudication, leg swelling, near-syncope, orthopnea, paroxysmal nocturnal dyspnea and syncope.  Neurological:  Positive for dizziness and paresthesias (hands).  Objective  Blood pressure 131/71, pulse 86, temperature 98.1 F (36.7 C), temperature source Temporal, resp. rate 16, height '5\' 10"'$  (1.778 m), weight 191 lb (86.6 kg), SpO2 95 %.     04/06/2022   10:36 AM 01/04/2022   10:36 AM 12/30/2021   10:28 AM  Vitals with BMI  Height '5\' 10"'$  '5\' 10"'$  '5\' 10"'$   Weight 191 lbs 191 lbs 10 oz 190 lbs  BMI 27.41 56.21 30.86  Systolic 578 469 629  Diastolic 71 78 66  Pulse 86 82 86    Orthostatic VS for the past 72 hrs (Last 3 readings):  Patient Position BP Location Cuff Size  04/06/22 1036 Sitting Left Arm Normal    Physical Exam Vitals reviewed.   Cardiovascular:     Rate and Rhythm: Normal rate and regular rhythm. No extrasystoles are present.    Pulses:          Carotid pulses are 2+ on the right side and 2+ on the left side with bruit.      Femoral pulses are 2+ on the right side and 2+ on the left side.      Dorsalis pedis pulses are 2+ on the right side and 0 on the left side.       Posterior tibial pulses are 1+ on the right side and 0 on the left side.     Heart sounds: Normal heart sounds, S1 normal and S2 normal. No murmur heard.    No gallop.  Pulmonary:     Effort: Pulmonary effort is normal. No respiratory distress.     Breath sounds: No wheezing, rhonchi or rales.     Comments: Decreased air movement left base  Musculoskeletal:     Right lower leg: No edema.     Left lower leg: No edema.  Neurological:     Mental Status: He is alert.      Laboratory examination:   Recent Labs    06/09/21 0040  NA 139  K 4.0  CL 100  CO2 26  GLUCOSE 136*  BUN 19  CREATININE 1.09  CALCIUM 8.9  GFRNONAA >60   CrCl cannot be calculated (Patient's most recent lab result is older than the maximum 21 days allowed.).     Latest Ref Rng & Units 06/09/2021   12:40 AM 10/07/2020    3:15 PM 12/17/2019    2:11 AM  CMP  Glucose 70 - 99 mg/dL 136  106  114   BUN 8 - 23 mg/dL 19  28  35   Creatinine 0.61 - 1.24 mg/dL 1.09  1.26  2.15   Sodium 135 - 145 mmol/L 139  138  138   Potassium 3.5 - 5.1 mmol/L 4.0  3.8  3.8   Chloride 98 - 111 mmol/L 100  100  110   CO2 22 - 32 mmol/L '26  21  22   '$ Calcium 8.9 - 10.3 mg/dL 8.9  8.8  9.0   Total Protein 6.5 - 8.1 g/dL 7.7     Total Bilirubin 0.3 - 1.2 mg/dL 0.8     Alkaline Phos 38 - 126 U/L 88     AST 15 - 41 U/L 26     ALT 0 - 44 U/L 24         Latest Ref Rng & Units 06/09/2021   12:40 AM 12/17/2019    2:11 AM 12/23/2015    4:00 AM  CBC  WBC 4.0 - 10.5 K/uL 15.8  11.6  12.1   Hemoglobin 13.0 - 17.0 g/dL 13.9  12.3  9.1   Hematocrit 39.0 - 52.0 % 40.7  38.2  27.3   Platelets  150 - 400 K/uL 284  295  306    Recent Labs    06/09/21 0303  TSH 1.106   External labs:  07/28/2021: HDL 46, LDL 60, total cholesterol 130, triglycerides 136,  A1c 5.5% BUN 18, creatinine 0.92, GFR >60 TSH 1.21  Cholesterol, total 103.000 m 05/27/2020 HDL 49.000 mg 05/27/2020 LDL-C 39.000 mg 05/27/2020 Triglycerides 65.000 mg 05/27/2020  Hemoglobin 13.100 g/d 05/27/2020 Platelets 295.000 K/ 12/17/2019  Creatinine, Serum 1.140 mg/ 05/27/2020 Potassium 4.100 mm 05/27/2020 ALT (SGPT) 17.000 U/L 05/27/2020    Glucose Random 81.000 02/27/2020 BUN 25.000 02/27/2020 Creatinine, Serum 1.160 02/27/2020  Cholesterol, total 128.000 12/10/2019 Triglycerides 132.000 12/10/2019 HDL 51.000 12/10/2019 LDL 54.000 12/10/2019  Hemoglobin 12.300 12/17/2019; Platelets 295.000 12/17/2019  Creatinine, Serum 2.150 12/17/2019 Potassium 3.800 12/17/2019 ALT (SGPT) 21.000 12/10/2019  TSH 2.061 12/17/2019; A1C 5.500 12/10/2019  Allergies  No Known Allergies   Medications Prior to Visit:   Outpatient Medications Prior to Visit  Medication Sig Dispense Refill   allopurinol (ZYLOPRIM) 100 MG tablet Take 100 mg by mouth daily.     amphetamine-dextroamphetamine (ADDERALL) 20 MG tablet Take 20-30 mg by mouth See admin instructions. Take 1 tablet every morning then take 1 and 1/2 tablets in the afternoon if needed     Ascorbic Acid (VITAMIN C) 100 MG tablet Take 100 mg by mouth daily.     atorvastatin (LIPITOR) 40 MG tablet Take 40 mg by mouth daily.     B Complex-C (SUPER B COMPLEX PO) Take by mouth.     candesartan (ATACAND) 32 MG tablet Take 32 mg by mouth daily.      chlorthalidone (HYGROTON) 25 MG tablet Take 25 mg by mouth daily.     Coenzyme Q10 (COQ-10 PO) Take 300 mg by mouth daily.     diclofenac Sodium (VOLTAREN) 1 % GEL Apply 4 g topically 4 (four) times daily. 100 g 0   diltiazem (CARDIZEM) 30 MG tablet Take 1 tablet every 4 hours AS NEEDED for AFIB heart rate >100 45 tablet 1   ELIQUIS 5 MG TABS tablet TAKE 1  TABLET BY MOUTH TWICE A DAY 180 tablet 3   gabapentin (NEURONTIN) 300 MG capsule Take 2 tablets 3 times a day (Patient taking differently: Take 300 mg by mouth as directed. Take 2 tablets  in the morning, 2 in the evening and 4 at bedtime) 540 capsule 3   metoprolol succinate (TOPROL-XL) 50 MG 24 hr tablet Take 1 tablet (50 mg total) by mouth daily. Take with or immediately following a meal. 90 tablet 3   Multiple Vitamin (MULTIVITAMIN WITH MINERALS) TABS tablet Take 1 tablet by mouth daily.     Omega-3 Fatty Acids (FISH OIL) 1200 MG CAPS Take 1 capsule by mouth daily.     SYMBICORT 160-4.5 MCG/ACT inhaler Inhale into the lungs.     tiZANidine (ZANAFLEX) 2 MG tablet Take 1 tablet (2 mg total) by mouth 2 (two) times daily as needed for muscle spasms. 60 tablet 5   Turmeric (QC TUMERIC COMPLEX PO) Take 1,500 mg by mouth daily.     valACYclovir (VALTREX) 500 MG tablet Take 500 mg by mouth daily.     Zinc Sulfate (ZINC 15 PO) Take by mouth.     Ferrous Sulfate (IRON) 325 (65 Fe) MG TABS Take 1 tablet by mouth daily.  hydrochlorothiazide (HYDRODIURIL) 50 MG tablet Take 1 tablet (50 mg total) by mouth daily. 90 tablet 0   Pitavastatin Calcium (LIVALO) 2 MG TABS Take 1 tablet by mouth daily.     No facility-administered medications prior to visit.   Final Medications at End of Visit    Current Meds  Medication Sig   allopurinol (ZYLOPRIM) 100 MG tablet Take 100 mg by mouth daily.   amphetamine-dextroamphetamine (ADDERALL) 20 MG tablet Take 20-30 mg by mouth See admin instructions. Take 1 tablet every morning then take 1 and 1/2 tablets in the afternoon if needed   Ascorbic Acid (VITAMIN C) 100 MG tablet Take 100 mg by mouth daily.   atorvastatin (LIPITOR) 40 MG tablet Take 40 mg by mouth daily.   B Complex-C (SUPER B COMPLEX PO) Take by mouth.   candesartan (ATACAND) 32 MG tablet Take 32 mg by mouth daily.    chlorthalidone (HYGROTON) 25 MG tablet Take 25 mg by mouth daily.   Coenzyme Q10  (COQ-10 PO) Take 300 mg by mouth daily.   diclofenac Sodium (VOLTAREN) 1 % GEL Apply 4 g topically 4 (four) times daily.   diltiazem (CARDIZEM) 30 MG tablet Take 1 tablet every 4 hours AS NEEDED for AFIB heart rate >100   ELIQUIS 5 MG TABS tablet TAKE 1 TABLET BY MOUTH TWICE A DAY   gabapentin (NEURONTIN) 300 MG capsule Take 2 tablets 3 times a day (Patient taking differently: Take 300 mg by mouth as directed. Take 2 tablets  in the morning, 2 in the evening and 4 at bedtime)   metoprolol succinate (TOPROL-XL) 50 MG 24 hr tablet Take 1 tablet (50 mg total) by mouth daily. Take with or immediately following a meal.   Multiple Vitamin (MULTIVITAMIN WITH MINERALS) TABS tablet Take 1 tablet by mouth daily.   Omega-3 Fatty Acids (FISH OIL) 1200 MG CAPS Take 1 capsule by mouth daily.   SYMBICORT 160-4.5 MCG/ACT inhaler Inhale into the lungs.   tiZANidine (ZANAFLEX) 2 MG tablet Take 1 tablet (2 mg total) by mouth 2 (two) times daily as needed for muscle spasms.   Turmeric (QC TUMERIC COMPLEX PO) Take 1,500 mg by mouth daily.   valACYclovir (VALTREX) 500 MG tablet Take 500 mg by mouth daily.   Zinc Sulfate (ZINC 15 PO) Take by mouth.   PRIVIGEN Every 3 weeks infusion by Neurology for  CIDP   Radiology:   Low dose CT Chest 08/11/2019: 1. Lung-RADS 2, benign appearance or behavior. Continue annual screening with low-dose chest CT without contrast in 12 months. 2. Two-vessel coronary atherosclerosis. 3. Aortic Atherosclerosis   Cardiac Studies:   Ultrasound arterial segmental pressure and exercise ABI 06/11/2017: Ankle-brachial indices and waveforms are normal at rest. Decreased ankle-brachial indices immediately after exercise, right side greater than left. ABIs returned to baseline within 2-3 minutes. Post exercise study is suggestive for mild underlying peripheral vascular disease, particularly in the right lower Extremity.  Right ABI: 0.68, one minute after exercise. 0.97, three minutes after  exercise. Left ABI: 0.85, one minute after exercise. 1.09, two minutes after exercise.  Carotid artery duplex 08/23/2017: Minimal stenosis in the bilateral internal carotid artery (minimal).  Minimal stenosis of the left common  carotid artery. Antegrade right vertebral artery flow. Antegrade left vertebral artery flow. F/U if clinically indicated.  Abdominal Aortic Duplex  01/28/2020:  Mild heterogeneous plaque is noted in the mid aorta. No AAA observed.  Normal iliac artery velocity and no e/o hemodynamically significant stenosis.    Ambulatory  cardiac telemetry 7 days (11/22/2021 - 11/30/2021): Predominant underlying rhythm was sinus.  3 episodes of atrial tachycardia with the longest lasting 18 beats and maximum heart rate of 146 bpm.  Occasional PACs and PVCs.  Ventricular bigeminy and trigeminy were present.  Patient triggered events correlated with: Normal sinus rhythm, PACs, PVCs.  No evidence of atrial fibrillation, VT, pauses >3 seconds, or high degree AV block.  Echocardiogram 01/04/2022:  Normal LV systolic function with visual EF 60-65%. Left ventricle cavity  is normal in size. Normal left ventricular wall thickness. Normal global  wall motion. Normal diastolic filling pattern, normal LAP.  Mild pulmonic regurgitation.  Compared to study 12/31/2019 no significant change.   Exercise Myoview stress test 02/15/2022: Exercise nuclear stress test was performed using Bruce protocol.   1 Day Rest and Stress images. Exercise time 8 minutes 30 seconds, achieved 9.09 METS, 95% APMHR.  No chest pain during exercise. Hypertensive response to exercise (resting BP 122/78, peak BP 230/78). Stress ECG equivocal due to EKG changes (85m upsloping ST depression inferolateral leads which recovers 3 minutes into recovery, PAC/PVC).  Medium size, mild intensity, reversible perfusion defect involving basal to distal inferior segments suggestive of possible ischemia in the RCA distribution. Left  ventricular size normal, LVEF 58%, no regional wall motion abnormalities. Intermediate risk study, clinical correlation required.  EKG   11/22/2021: Sinus rhythm with single PAC at a rate of 80 bpm.  Normal axis.  Nonspecific T wave abnormality.  No evidence of ischemia or underlying injury pattern.  08/03/2021: Sinus rhythm at a rate of 69 bpm.  Normal axis.  Left atrial lodgment.  No evidence of ischemia or underlying injury pattern.  09/22/2020: Normal sinus rhythm with rate of 68 bpm, left atrial enlargement, normal axis.  No evidence of ischemia.   01/12/2020: Normal sinus rhythm at rate of 67 bpm, normal axis.  No evidence of ischemia, normal EKG.    Assessment    1. Abnormal stress test   2. PAC (premature atrial contraction)   3. PVC (premature ventricular contraction)      CHA2DS2-VASc Score is 3.  Yearly risk of stroke: 3.2% (A, HTN, vasc dz).  Score of 1=0.6; 2=2.2; 3=3.2; 4=4.8; 5=7.2; 6=9.8; 7=>9.8) -(CHF; HTN; vasc disease DM,  Male = 1; Age <65 =0; 65-74 = 1,  >75 =2; stroke/embolism= 2).   No orders of the defined types were placed in this encounter.   Medications Discontinued During This Encounter  Medication Reason   hydrochlorothiazide (HYDRODIURIL) 50 MG tablet    Ferrous Sulfate (IRON) 325 (65 Fe) MG TABS    Pitavastatin Calcium (LIVALO) 2 MG TABS      Orders Placed This Encounter  Procedures   Basic metabolic panel   CBC      Recommendations:   Quantavius C JMartinique is a 71y.o. hypertension, hyperlipidemia, obstructive sleep apnea on CPAP follows Dr. OMaxwell Caul medically managed mild PAD, paroxysmal atrial fibrillation with RVR on 12/17/2019 presenting to the ED SP cardioversion, chronic inflammatory demyelinating polyneuropathy (CIDP) and received immunotherapy and also steroid therapy in the past.   Patient was last seen in the office 01/04/2022 at which time ordered echocardiogram and stress test given frequent ectopy on cardiac monitor.  Stress test  revealed hypertensive response to exercise stress, EKG equivocal for ischemia, and myocardial perfusion imaging with possible ischemia in RCA distribution.  Echocardiogram revealed LVEF of 60-65%, otherwise unremarkable.  Given abnormal stress test as discussed above as well as continued reduced exercise tolerance and  ectopy on monitor showed decision was to proceed with left heart catheterization.  The left heart catheterization procedure was explained to the patient in detail. The indication, alternatives, risks and benefits were reviewed. Complications including but not limited to bleeding, infection, acute kidney injury, blood transfusion, heart rhythm disturbances, contrast (dye) reaction, damage to the arteries or nerves in the legs or hands, cerebrovascular accident, myocardial infarction, need for emergent bypass surgery, blood clots in the legs, possible need for emergent blood transfusion, and rarely death were reviewed and discussed with the patient. The patient voices understanding and wishes to proceed.  Also in regard to blood pressure management, patient's blood pressures remain labile.  We will enroll him in remote patient monitoring with this.  Follow up in 4-6 weeks.    Alethia Berthold, PA-C 04/06/2022, 11:38 AM Office: [phone]

## 2022-04-27 ENCOUNTER — Ambulatory Visit: Payer: Medicare Other | Admitting: Cardiology

## 2022-04-27 ENCOUNTER — Ambulatory Visit: Payer: Medicare Other | Admitting: Student

## 2022-04-27 ENCOUNTER — Encounter: Payer: Self-pay | Admitting: Cardiology

## 2022-04-27 VITALS — BP 131/74 | HR 81 | Temp 98.0°F | Resp 16 | Ht 70.0 in | Wt 192.0 lb

## 2022-04-27 DIAGNOSIS — I25118 Atherosclerotic heart disease of native coronary artery with other forms of angina pectoris: Secondary | ICD-10-CM

## 2022-04-27 DIAGNOSIS — I10 Essential (primary) hypertension: Secondary | ICD-10-CM

## 2022-04-27 DIAGNOSIS — I480 Paroxysmal atrial fibrillation: Secondary | ICD-10-CM | POA: Diagnosis not present

## 2022-04-27 MED ORDER — ISOSORBIDE MONONITRATE ER 30 MG PO TB24: 30.0000 mg | ORAL_TABLET | Freq: Every day | ORAL | 3 refills | Status: DC

## 2022-04-27 MED ORDER — APIXABAN 5 MG PO TABS: 5.0000 mg | ORAL_TABLET | Freq: Two times a day (BID) | ORAL | 3 refills | Status: DC

## 2022-04-27 MED ORDER — MULTAQ 400 MG PO TABS
400.0000 mg | ORAL_TABLET | Freq: Two times a day (BID) | ORAL | 3 refills | Status: DC
Start: 2022-04-27 — End: 2022-05-25

## 2022-04-27 NOTE — Progress Notes (Signed)
Follow up visit  Subjective:   Richard Lynch, male    DOB: August 25, 1951, 71 y.o.   MRN: [id number]    HPI  Chief Complaint  Patient presents with   PVC   Results    Abnormal stress test    71 year old Caucasian male with hypertension, hyperlipidemia, CAD, PAD,  paroxysmal A-fib, OSA on CPAP, CIDP  Patient underwent coronary angiography in 03/2022, that showed mid RCA stenosis with adjacent ectatic areas.  His symptoms of decreased exercise tolerance seem to correlate with his episodes of A-fib.  At the time of coronary angiography, patient was on minimal antianginal management, and no rhythm control management for A-fib.  I started him on Imdur 30 mg daily, as well as multaq 400 mg twice daily, with plans for elective PCI to mid RCA, should symptoms not improve with optimal medical management.  Patient is here for follow-up today.  He has not had any episodes of A-fib in the last 2 weeks.  Previously, he would have 2-3 episodes of A-fib per month.  He denies any specific chest pain symptoms.  However, he notes that he overall has had decreased energy and ability to do physical activity, such as working [place] the [place].    Current Outpatient Medications:    allopurinol (ZYLOPRIM) 100 MG tablet, Take 200 mg by mouth daily., Disp: , Rfl:    amphetamine-dextroamphetamine (ADDERALL) 20 MG tablet, Take 20-30 mg by mouth daily as needed (sleepiness)., Disp: , Rfl:    apixaban (ELIQUIS) 5 MG TABS tablet, Take 1 tablet (5 mg total) by mouth 2 (two) times daily. Resume 04/12/2022 evening dose, Disp: 180 tablet, Rfl: 3   Ascorbic Acid (VITAMIN C PO), Take 600 mg by mouth daily., Disp: , Rfl:    aspirin EC 81 MG tablet, Take 1 tablet (81 mg total) by mouth daily., Disp: 90 tablet, Rfl: 3   atorvastatin (LIPITOR) 40 MG tablet, Take 40 mg by mouth daily., Disp: , Rfl:    B Complex-C (SUPER B COMPLEX PO), Take 1 tablet by mouth daily., Disp: , Rfl:    candesartan (ATACAND) 32 MG tablet, Take 32  mg by mouth daily. , Disp: , Rfl:    chlorthalidone (HYGROTON) 25 MG tablet, Take 25 mg by mouth daily., Disp: , Rfl:    Cholecalciferol (VITAMIN D3) 50 MCG (2000 UT) capsule, Take 2,000 Units by mouth daily., Disp: , Rfl:    Coenzyme Q10 (COQ-10 PO), Take 100-300 mg by mouth daily., Disp: , Rfl:    Cyanocobalamin (VITAMIN B-12) 5000 MCG SUBL, Place 10,000 mcg under the tongue daily., Disp: , Rfl:    diclofenac Sodium (VOLTAREN) 1 % GEL, Apply 4 g topically 4 (four) times daily. (Patient taking differently: Apply 4 g topically daily as needed (pain gel).), Disp: 100 g, Rfl: 0   diltiazem (CARDIZEM) 30 MG tablet, Take 1 tablet every 4 hours AS NEEDED for AFIB heart rate >100, Disp: 45 tablet, Rfl: 1   dronedarone (MULTAQ) 400 MG tablet, Take 1 tablet (400 mg total) by mouth 2 (two) times daily with a meal., Disp: 60 tablet, Rfl: 1   gabapentin (NEURONTIN) 300 MG capsule, Take 2 tablets 3 times a day (Patient taking differently: Take 600-1,200 mg by mouth See admin instructions. Take 600 mg   in the morning, 600 mg in the evening and 1200 mg at bedtime), Disp: 540 capsule, Rfl: 3   isosorbide mononitrate (IMDUR) 30 MG 24 hr tablet, Take 1 tablet (30 mg total) by mouth  daily., Disp: 30 tablet, Rfl: 1   metoprolol succinate (TOPROL-XL) 50 MG 24 hr tablet, Take 1 tablet (50 mg total) by mouth daily. Take with or immediately following a meal., Disp: 90 tablet, Rfl: 3   Multiple Vitamin (MULTIVITAMIN WITH MINERALS) TABS tablet, Take 1 tablet by mouth daily. Centrum silver, Disp: , Rfl:    SYMBICORT 160-4.5 MCG/ACT inhaler, Inhale 2 puffs into the lungs daily as needed (lungs)., Disp: , Rfl:    Turmeric (QC TUMERIC COMPLEX PO), Take 1,500 mg by mouth daily., Disp: , Rfl:    valACYclovir (VALTREX) 500 MG tablet, Take 500 mg by mouth daily., Disp: , Rfl:    Zinc Sulfate (ZINC 15 PO), Take 50 mg by mouth daily., Disp: , Rfl:    Cardiovascular & other pertient studies:  Reviewed external labs and tests,  independently interpreted  EKG 04/11/2022: Sinus rhythm 72 bpm Normal EKG  Coronary angiography 04/11/2022: LM: Normal LAD: Tortious vessel          Ostial 20% stenosis with mild calcification Lcx: Tortuous vessel. No significant disease RCA: Large, dominant, tortuous vessel          80% proximal stenosis with adjacent aneurysmal areas           60% distal stenosis   Normal LVEDP   Single vessel obstructive disease Symptoms of decreased exercise tolerance could be due to combination of CAD as well as PAF Recommend aggressive medical therapy for both Added Imdur 30 mg and Multaq 400 mg bid  If no symptom improvement, recommend PCI to prox-mid RCA, followed by triple therapy for 1 month (ASA, plavix, eliquis), then Aspirin/plavix + eliquis  Exercise Myoview stress test 02/15/2022: Exercise nuclear stress test was performed using Bruce protocol.   1 Day Rest and Stress images. Exercise time 8 minutes 30 seconds, achieved 9.09 METS, 95% APMHR.  No chest pain during exercise. Hypertensive response to exercise (resting BP 122/78, peak BP 230/78). Stress ECG equivocal due to EKG changes (19m upsloping ST depression inferolateral leads which recovers 3 minutes into recovery, PAC/PVC).  Medium size, mild intensity, reversible perfusion defect involving basal to distal inferior segments suggestive of possible ischemia in the RCA distribution. Left ventricular size normal, LVEF 58%, no regional wall motion abnormalities. Intermediate risk study, clinical correlation required.    Recent labs: 04/06/2022: Glucose 128, BUN/Cr 23/1.0. EGFR 81. Na/K 143/4.3.  H/H 14/39. MCV 96. Platelets 280  07/2021: HbA1C 5.5%  05/2020: Chol 103, TG 65, HDL 49, LDL 39   Review of Systems  Cardiovascular:  Negative for chest pain, dyspnea on exertion, leg swelling, palpitations and syncope.       Decreased exercise tolerance         Vitals:   04/27/22 1008  BP: 131/74  Pulse: 81  Resp: 16   Temp: 98 F (36.7 C)  SpO2: 96%    Body mass index is 27.55 kg/m. Filed Weights   04/27/22 1008  Weight: 192 lb (87.1 kg)     Objective:   Physical Exam Vitals and nursing note reviewed.  Constitutional:      General: He is not in acute distress. Neck:     Vascular: No JVD.  Cardiovascular:     Rate and Rhythm: Normal rate and regular rhythm.     Heart sounds: Normal heart sounds. No murmur heard. Pulmonary:     Effort: Pulmonary effort is normal.     Breath sounds: Normal breath sounds. No wheezing or rales.  Musculoskeletal:  Right lower leg: No edema.     Left lower leg: No edema.             Visit diagnoses:   ICD-10-CM   1. Coronary artery disease involving native coronary artery of native heart with other form of angina pectoris ([place])  I25.118     2. Paroxysmal atrial fibrillation ([facility])  I48.0 apixaban (ELIQUIS) 5 MG TABS tablet    3. Primary hypertension  I10        No orders of the defined types were placed in this encounter.    Meds ordered this encounter  Medications   dronedarone (MULTAQ) 400 MG tablet    Sig: Take 1 tablet (400 mg total) by mouth 2 (two) times daily with a meal.    Dispense:  180 tablet    Refill:  3   apixaban (ELIQUIS) 5 MG TABS tablet    Sig: Take 1 tablet (5 mg total) by mouth 2 (two) times daily. Resume 04/12/2022 evening dose    Dispense:  180 tablet    Refill:  3   isosorbide mononitrate (IMDUR) 30 MG 24 hr tablet    Sig: Take 1 tablet (30 mg total) by mouth daily.    Dispense:  90 tablet    Refill:  3     Assessment & Recommendations:   71 year old Caucasian male with hypertension, hyperlipidemia, CAD, PAD,  paroxysmal A-fib, OSA on CPAP, CIDP  Coronary artery disease, Single-vessel disease with severe mid RCA stenosis with adjacent ectasia. Good exercise capacity without chest pain, but areas of medium size mild ischemia in distal RCA territory noted on stress testing 01/2022. Currently, he has no  specific chest pain symptoms, but has nonspecific decreased exercise tolerance.  One possibility is that ischemia may be driving some of his A-fib. That said, his symptoms of A-fib have improved since starting Multaq. We had a long discussion regarding benefits and risks of coronary revascularization in stable coronary artery disease setting. Performed for symptom improvement, further revascularization will be beneficial in favor coronary artery disease.  In his case, he would need to be on triple therapy for at least 1 month, before discontinuing aspirin and continuing Plavix and Eliquis.  His affect is here of her condition challenges with coronary vascularization, but it is certainly feasible.    In the current situation, given that he is improved symptomatically after addition of Imdur for CAD and Multaq for paroxysmal atrial fibrillation, we will continue current medical management with close follow-up.  If in the coming 4 weeks, if he has any recurrent A-fib symptoms, or continued "no energy level "-which could be decreased exercise tolerance; we could certainly consider revascularization to mid RCA.  At the same time, patient aware of sick expectation that PCI may or may not improve his overall nonspecific complaint of decreased energy levels.  At this time, recommend aspirin 81 mg, statin, metoprolol, Imdur  Obviously, should he have any unstable angina symptoms or ACS, we will move forward with PCI to mid RCA sooner.  PAF: No recurrence of A-fib episode, as monitored by patient's Apple Watch, since being on Multaq 400 mg daily. CHA2DS2VAS score 3, annual stroke risk 3.2% Continue Eliquis 5 mg twice daily  Time spent: 40-min     [name], MD Pager: [phone] Office: [phone]

## 2022-04-28 ENCOUNTER — Encounter: Payer: Self-pay | Admitting: Cardiology

## 2022-05-25 ENCOUNTER — Encounter: Payer: Self-pay | Admitting: Cardiology

## 2022-05-25 ENCOUNTER — Ambulatory Visit: Payer: Medicare Other | Admitting: Cardiology

## 2022-05-25 VITALS — BP 134/68 | HR 77 | Temp 98.0°F | Resp 16 | Ht 70.0 in | Wt 190.0 lb

## 2022-05-25 DIAGNOSIS — I10 Essential (primary) hypertension: Secondary | ICD-10-CM

## 2022-05-25 DIAGNOSIS — I25118 Atherosclerotic heart disease of native coronary artery with other forms of angina pectoris: Secondary | ICD-10-CM | POA: Diagnosis not present

## 2022-05-25 DIAGNOSIS — I480 Paroxysmal atrial fibrillation: Secondary | ICD-10-CM | POA: Diagnosis not present

## 2022-05-25 MED ORDER — MULTAQ 400 MG PO TABS: 400.0000 mg | ORAL_TABLET | Freq: Two times a day (BID) | ORAL | 3 refills | Status: DC

## 2022-05-25 MED ORDER — ISOSORBIDE MONONITRATE ER 30 MG PO TB24: 30.0000 mg | ORAL_TABLET | Freq: Every day | ORAL | 3 refills | Status: DC

## 2022-05-25 NOTE — Progress Notes (Signed)
 Follow up visit  Subjective:   Richard Lynch, male    DOB: 06/26/1951, 71 y.o.   MRN: [id number]    HPI  Chief Complaint  Patient presents with   Coronary Artery Disease   Atrial Fibrillation   Follow-up    4 week    71-year-old Caucasian male with hypertension, hyperlipidemia, CAD, PAD,  paroxysmal A-fib, OSA on CPAP, CIDP  Patient underwent coronary angiography in 03/2022, that showed mid RCA stenosis with adjacent ectatic areas.  His symptoms of decreased exercise tolerance seem to correlate with his episodes of A-fib.  At the time of coronary angiography, patient was on minimal antianginal management, and no rhythm control management for A-fib.  I started him on Imdur 30 mg daily, as well as multaq 400 mg twice daily, with plans for elective PCI to mid RCA, should symptoms not improve with optimal medical management.  Patient is here for follow-up today.  He has not had any episodes of A-fib in the last 6 weeks, since being started Multaq.  He has not been doing much in terms of physical activity, but has not had any chest pain, shortness of breath, decreased energy symptoms.   Current Outpatient Medications:    allopurinol (ZYLOPRIM) 100 MG tablet, Take 200 mg by mouth daily., Disp: , Rfl:    amphetamine-dextroamphetamine (ADDERALL) 20 MG tablet, Take 20-30 mg by mouth daily as needed (sleepiness)., Disp: , Rfl:    apixaban (ELIQUIS) 5 MG TABS tablet, Take 1 tablet (5 mg total) by mouth 2 (two) times daily. Resume 04/12/2022 evening dose, Disp: 180 tablet, Rfl: 3   Ascorbic Acid (VITAMIN C PO), Take 600 mg by mouth daily., Disp: , Rfl:    aspirin EC 81 MG tablet, Take 1 tablet (81 mg total) by mouth daily., Disp: 90 tablet, Rfl: 3   atorvastatin (LIPITOR) 40 MG tablet, Take 40 mg by mouth daily., Disp: , Rfl:    B Complex-C (SUPER B COMPLEX PO), Take 1 tablet by mouth daily., Disp: , Rfl:    candesartan (ATACAND) 32 MG tablet, Take 32 mg by mouth daily. , Disp: , Rfl:     chlorthalidone (HYGROTON) 25 MG tablet, Take 25 mg by mouth daily., Disp: , Rfl:    Cholecalciferol (VITAMIN D3) 50 MCG (2000 UT) capsule, Take 2,000 Units by mouth daily., Disp: , Rfl:    Coenzyme Q10 (COQ-10 PO), Take 100-300 mg by mouth daily., Disp: , Rfl:    Cyanocobalamin (VITAMIN B-12) 5000 MCG SUBL, Place 10,000 mcg under the tongue daily., Disp: , Rfl:    diclofenac Sodium (VOLTAREN) 1 % GEL, Apply 4 g topically 4 (four) times daily. (Patient taking differently: Apply 4 g topically daily as needed (pain gel).), Disp: 100 g, Rfl: 0   diltiazem (CARDIZEM) 30 MG tablet, Take 1 tablet every 4 hours AS NEEDED for AFIB heart rate >100, Disp: 45 tablet, Rfl: 1   dronedarone (MULTAQ) 400 MG tablet, Take 1 tablet (400 mg total) by mouth 2 (two) times daily with a meal., Disp: 180 tablet, Rfl: 3   gabapentin (NEURONTIN) 300 MG capsule, Take 2 tablets 3 times a day (Patient taking differently: Take 600-1,200 mg by mouth See admin instructions. Take 600 mg   in the morning, 600 mg in the evening and 1200 mg at bedtime), Disp: 540 capsule, Rfl: 3   isosorbide mononitrate (IMDUR) 30 MG 24 hr tablet, Take 1 tablet (30 mg total) by mouth daily., Disp: 90 tablet, Rfl: 3   metoprolol succinate (  TOPROL-XL) 50 MG 24 hr tablet, Take 1 tablet (50 mg total) by mouth daily. Take with or immediately following a meal., Disp: 90 tablet, Rfl: 3   Multiple Vitamin (MULTIVITAMIN WITH MINERALS) TABS tablet, Take 1 tablet by mouth daily. Centrum silver, Disp: , Rfl:    SYMBICORT 160-4.5 MCG/ACT inhaler, Inhale 2 puffs into the lungs daily as needed (lungs)., Disp: , Rfl:    Turmeric (QC TUMERIC COMPLEX PO), Take 1,500 mg by mouth daily., Disp: , Rfl:    valACYclovir (VALTREX) 500 MG tablet, Take 500 mg by mouth daily., Disp: , Rfl:    Zinc Sulfate (ZINC 15 PO), Take 50 mg by mouth daily., Disp: , Rfl:    Cardiovascular & other pertient studies:  Reviewed external labs and tests, independently interpreted  EKG  04/11/2022: Sinus rhythm 72 bpm Normal EKG  Coronary angiography 04/11/2022: LM: Normal LAD: Tortious vessel          Ostial 20% stenosis with mild calcification Lcx: Tortuous vessel. No significant disease RCA: Large, dominant, tortuous vessel          80% proximal stenosis with adjacent aneurysmal areas           60% distal stenosis   Normal LVEDP   Single vessel obstructive disease Symptoms of decreased exercise tolerance could be due to combination of CAD as well as PAF Recommend aggressive medical therapy for both Added Imdur 30 mg and Multaq 400 mg bid  If no symptom improvement, recommend PCI to prox-mid RCA, followed by triple therapy for 1 month (ASA, plavix, eliquis), then Aspirin/plavix + eliquis  Exercise Myoview stress test 02/15/2022: Exercise nuclear stress test was performed using Bruce protocol.   1 Day Rest and Stress images. Exercise time 8 minutes 30 seconds, achieved 9.09 METS, 95% APMHR.  No chest pain during exercise. Hypertensive response to exercise (resting BP 122/78, peak BP 230/78). Stress ECG equivocal due to EKG changes (2mm upsloping ST depression inferolateral leads which recovers 3 minutes into recovery, PAC/PVC).  Medium size, mild intensity, reversible perfusion defect involving basal to distal inferior segments suggestive of possible ischemia in the RCA distribution. Left ventricular size normal, LVEF 58%, no regional wall motion abnormalities. Intermediate risk study, clinical correlation required.    Recent labs: 04/06/2022: Glucose 128, BUN/Cr 23/1.0. EGFR 81. Na/K 143/4.3.  H/H 14/39. MCV 96. Platelets 280  07/2021: HbA1C 5.5%  05/2020: Chol 103, TG 65, HDL 49, LDL 39   Review of Systems  Cardiovascular:  Negative for chest pain, dyspnea on exertion, leg swelling, palpitations and syncope.       Decreased exercise tolerance         There were no vitals filed for this visit.   There is no height or weight on file to calculate  BMI. There were no vitals filed for this visit.    Objective:   Physical Exam Vitals and nursing note reviewed.  Constitutional:      General: He is not in acute distress. Neck:     Vascular: No JVD.  Cardiovascular:     Rate and Rhythm: Normal rate and regular rhythm.     Heart sounds: Normal heart sounds. No murmur heard. Pulmonary:     Effort: Pulmonary effort is normal.     Breath sounds: Normal breath sounds. No wheezing or rales.  Musculoskeletal:     Right lower leg: No edema.     Left lower leg: No edema.               Visit diagnoses:   ICD-10-CM   1. Coronary artery disease involving native coronary artery of native heart with other form of angina pectoris ([facility])  I25.118 isosorbide mononitrate (IMDUR) 30 MG 24 hr tablet    2. Paroxysmal atrial fibrillation ([facility])  I48.0 dronedarone (MULTAQ) 400 MG tablet    3. Primary hypertension  I10          Meds ordered this encounter  Medications   isosorbide mononitrate (IMDUR) 30 MG 24 hr tablet    Sig: Take 1 tablet (30 mg total) by mouth daily.    Dispense:  90 tablet    Refill:  3   dronedarone (MULTAQ) 400 MG tablet    Sig: Take 1 tablet (400 mg total) by mouth 2 (two) times daily with a meal.    Dispense:  180 tablet    Refill:  3     Assessment & Recommendations:   71-year-old Caucasian male with hypertension, hyperlipidemia, CAD, PAD,  paroxysmal A-fib, OSA on CPAP, CIDP  Coronary artery disease, Single-vessel disease with severe mid RCA stenosis with adjacent ectasia. Good exercise capacity without chest pain, but areas of medium size mild ischemia in distal RCA territory noted on stress testing 01/2022. Currently, he has no specific chest pain symptoms.  With rhythm control of A-fib with Multaq, he has not had any significant decreased exercise tolerance symptoms either.   We mutually decided to continue current treatment for both CAD and A-fib.  As he increases physical activity, I will be  important to note if he has any anginal symptoms.  In case of recurrent angina, I would consider PCI to RCA.  In absence of it, continue current medical therapy.  This includes Imdur 30 mg daily, Multaq 400 mg twice daily, in addition to baseline therapy of aspirin 81 mg daily, Eliquis 5 mg twice daily, metoprolol succinate 50 mg daily, rest of the antihypertensive therapy-including candesartan and chlorthalidone.  PAF: No recurrence of A-fib episode, as monitored by patient's Apple Watch, since being on Multaq 400 mg daily. CHA2DS2VAS score 3, annual stroke risk 3.2% Continue Eliquis 5 mg twice daily Time spent: 40-min     Manish J Patwardhan, MD Pager: [phone] Office: [phone] 

## 2022-07-02 DIAGNOSIS — Z23 Encounter for immunization: Secondary | ICD-10-CM | POA: Diagnosis not present

## 2022-07-06 ENCOUNTER — Other Ambulatory Visit: Payer: Self-pay | Admitting: Neurology

## 2022-07-24 ENCOUNTER — Ambulatory Visit (INDEPENDENT_AMBULATORY_CARE_PROVIDER_SITE_OTHER): Payer: Medicare Other | Admitting: Neurology

## 2022-07-24 ENCOUNTER — Encounter: Payer: Self-pay | Admitting: Neurology

## 2022-07-24 VITALS — BP 103/56 | HR 78 | Ht 70.0 in | Wt 190.0 lb

## 2022-07-24 DIAGNOSIS — I25118 Atherosclerotic heart disease of native coronary artery with other forms of angina pectoris: Secondary | ICD-10-CM

## 2022-07-24 DIAGNOSIS — M542 Cervicalgia: Secondary | ICD-10-CM

## 2022-07-24 DIAGNOSIS — G629 Polyneuropathy, unspecified: Secondary | ICD-10-CM

## 2022-07-24 DIAGNOSIS — M5412 Radiculopathy, cervical region: Secondary | ICD-10-CM

## 2022-07-24 MED ORDER — TIZANIDINE HCL 2 MG PO TABS: 2.0000 mg | ORAL_TABLET | Freq: Every evening | ORAL | 5 refills | Status: DC | PRN

## 2022-07-24 MED ORDER — GABAPENTIN 300 MG PO CAPS: 1200.0000 mg | ORAL_CAPSULE | ORAL | 3 refills | Status: DC

## 2022-07-24 NOTE — Patient Instructions (Signed)
Start neck physiotherapy   Start tizandine '2mg'$  - '4mg'$  at bedtime  Continue gabapentin '1200mg'$  at bedtime  Return to clinic in 9 months

## 2022-07-24 NOTE — Progress Notes (Signed)
Follow-up Visit   Date: 07/24/22   Richard Lynch MRN: [id number] DOB: 12/27/1950   Interim History: Richard Lynch is a 71 y.o. left-handed Caucasian male with hypertension, hyperlipidemia, and tobacco use returning to the clinic for follow-up of polyneuropathy.  The patient was accompanied to the clinic by self.  IMPRESSION/PLAN: Chronic right hand weakness and paresthesias due to overlapping C8 radiculopathy and entrapment neuropathies. Exam is stable. 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 He has been off IVIG since December 2021.   He has sought second opinion at [facility], who agree that his hand paresthesias and weakness is mostly secondary to C8 radiculopathy with overlapping entrapment neuropathy.  He saw Neurosurgery at [facility] who advised that he is high risk for cervical surgery and recommended conservative therapies.  Management is supportive For pain, continue, continue gabapentin 1200mg  at bedtime.  For neck pain, restart tizanidine 2mg  BID as needed Start neck PT  Return to clinic in 9 months --------------------------------------------------------------------------------------------  UPDATE 07/24/2022:  He feels that his hands are continuing to get weaker and often drops objects.  He denies any worsening numbness/tingling.  He also complains of chronic neck pain and stiffness.  He is not taking tizanidine, but does not recall why he stopped it.  Sometimes, he has shooting pain towards his head.  Balance is also poor.  No falls, walks unassisted.  Pain remains relatively controlled on gabapentin 1200mg  at bedtime.    Medications:  Current Outpatient Medications on File Prior to Visit  Medication Sig Dispense Refill   allopurinol (ZYLOPRIM) 100 MG tablet Take 200 mg by mouth daily.      amphetamine-dextroamphetamine (ADDERALL) 20 MG tablet Take 20-30 mg by mouth daily as needed (sleepiness).     apixaban (ELIQUIS) 5 MG TABS tablet Take 1 tablet (5 mg total) by mouth 2 (two) times daily. Resume 04/12/2022 evening dose 180 tablet 3   Ascorbic Acid (VITAMIN C PO) Take 600 mg by mouth daily.     aspirin EC 81 MG tablet Take 1 tablet (81 mg total) by mouth daily. 90 tablet 3   atorvastatin (LIPITOR) 40 MG tablet Take 40 mg by mouth daily.     B Complex-C (SUPER B COMPLEX PO) Take 1 tablet by mouth daily.     candesartan (ATACAND) 32 MG tablet Take 32 mg by mouth daily.      chlorthalidone (HYGROTON) 25 MG tablet Take 25 mg by mouth daily.     Cholecalciferol (VITAMIN D3) 50 MCG (2000 UT) capsule Take 2,000 Units by mouth daily.     Coenzyme Q10 (COQ-10 PO) Take 100-300 mg by mouth daily.     Cyanocobalamin (VITAMIN B-12) 5000 MCG SUBL Place 10,000 mcg under the tongue daily.     diclofenac Sodium (VOLTAREN) 1 % GEL Apply 4 g topically 4 (four) times daily. (Patient taking differently: Apply 4 g topically daily as needed (pain gel).) 100 g 0   diltiazem (CARDIZEM) 30 MG tablet Take 1 tablet every 4 hours AS NEEDED for AFIB heart rate >100 45 tablet 1   dronedarone (MULTAQ) 400 MG tablet Take 1 tablet (400 mg total) by mouth 2 (two) times daily with a meal. 180 tablet 3   gabapentin (NEURONTIN) 300 MG capsule Take 2 tablets 3 times a day (Patient taking differently: Take 600-1,200 mg by mouth See admin instructions. Take 600  mg   in the morning, 600 mg in the evening and 1200 mg at bedtime) 540 capsule 3   isosorbide mononitrate (IMDUR) 30 MG 24 hr tablet Take 1 tablet (30 mg total) by mouth daily. 90 tablet 3   metoprolol succinate (TOPROL-XL) 50 MG 24 hr tablet Take 1 tablet (50 mg total) by mouth daily. Take with or immediately following a meal. 90 tablet 3   Multiple Vitamin (MULTIVITAMIN WITH MINERALS) TABS tablet Take 1 tablet by mouth daily. Centrum silver     SYMBICORT 160-4.5  MCG/ACT inhaler Inhale 2 puffs into the lungs daily as needed (lungs).     Turmeric (QC TUMERIC COMPLEX PO) Take 1,500 mg by mouth daily.     valACYclovir (VALTREX) 500 MG tablet Take 500 mg by mouth daily.     Zinc Sulfate (ZINC 15 PO) Take 50 mg by mouth daily.     No current facility-administered medications on file prior to visit.    Allergies: No Known Allergies   Vital Signs:  BP (!) 103/56   Pulse 78   Ht $R'5\' 10"'qp$  (1.778 m)   Wt 190 lb (86.2 kg)   SpO2 94%   BMI 27.26 kg/m     Neurological Exam: MENTAL STATUS including orientation to time, place, person, recent and remote memory, attention span and concentration, language, and fund of knowledge is normal.  Speech is not dysarthric.  CRANIAL NERVES: Face is symmetric.  No ptosis.  MOTOR: Marked atrophy of the left intrinsic hand muscles and forearm. Left claw hand deformity. No  fasciculations or abnormal movements.    Upper Extremity:  Right  Left  Deltoid  5/5   5/5   Biceps  5/5   5/5   Triceps  5/5   5/5   Infraspinatus 5/5  5/5  Medial pectoralis 5/5  5/5  Wrist extensors  5/5   5/5   Wrist flexors  5/5   5/5   Finger extensors  5/5   2+/5   Finger flexors  5/5   5-/5   Dorsal interossei  5/5   3+/5   Abductor pollicis  5/5   1-/5   Tone (Ashworth scale)  0  0   Lower extremities 5/5 throughout   MSRs:                                           Right        Left brachioradialis 2+  2+  biceps 2+  2+  triceps 2+  2+  patellar 1+  2+  ankle jerk 0  0   SENSORY:  Vibration is reduced to at the ankles. Temperature remains reduced over the palms/fingers.  COORDINATION/GAIT: Gait narrow based and stable, mild unsteadiness with turns  Data: DATA: NCS/EMG of the legs 04/17/2019: The electrophysiologic findings are consistent with a sensorimotor polyneuropathy, demyelinating and axonal loss in type affecting the lower extremities.  The presence of sural-sparing suggests findings may be associated with a  polyradiculoneuropathy, correlate clinically.  NCS/EMG of the arms 04/01/2019 This is a complex study of the upper extremities.  Findings are as follows:  Bilateral median neuropathy at or distal to the wrist (severe), consistent with a clinical diagnosis of carpal tunnel syndrome.   Bilateral ulnar neuropathy with slowing across the elbow, mild on the right and severe on the left. Chronic left posterior interosseous neuropathy, very severe.  Chronic C7-8 radiculopathy affecting bilateral upper extremities, moderate in degree electrically.  MRI cervical spine wo contrast 07/04/2018: 1. C5-T3 posterior-lateral fusion with decompressive laminectomy from C5-6 to T1-2. There is no arthrodesis by CT and the C5 and C6 lateral mass screws are loose. 2. Disc and facet degeneration causes multilevel foraminal impingement. Greatest foraminal impingement is seen on the left at C2-3, bilaterally at C3-4, right at C5-6, and right at T1-2. 3. Widely patent canal at levels of laminectomy. Noncompressive spinal stenosis seen at C3-4 and C4-5. 4. Septated fluid collection within the lower laminectomy defect without dural mass effect, best attributed to seroma.  MRI thoracic spine wo contrast 07/05/2018: Cervical and thoracic fusion and laminectomy. 15 mm fluid collection in the laminectomy bed. No cord compression or cord signal abnormality. Left paracentral disc protrusion T7-8 unchanged from the prior MRI.  CSF testing 05/08/2019:   W0 R1 G73 P105*, 2 oligoclonal bands, normal IgG index, MBP and ACE  Labs 04/17/2019: ESR 6, vitamin B1 28, TSH 1.26, copper 106, SPEP with IFE no M protein, vitamin B12 >2000, folate 19.6  NCS/EMG and US of the upper extremities 10/05/2021: There was electrodiagnostic and sonographic evidence of severe left median neuropathy at the wrist (carpal tunnel syndrome), left ulnar neuropathy with sonographic localization at the elbow as well as a left subchronic C6-8 cervical  radiculopathy. There was no electrodiagnostic evidence of a diffuse large fiber polyneuropathy. There appears to also be sonographic evidence consistent with right median neuropathy at the wrist, as may be seen in carpal tunnel syndrome. This was not fully explored on this study given the extensive nature of prior testing and clinical question asked, but could be explored further on a future study if clinically indicated. Clinical correlation advised.      Thank you for allowing me to participate in patient's care.  If I can answer any additional questions, I would be pleased to do so.    Sincerely,    Thor Nannini K. Posey Pronto, DO

## 2022-07-26 ENCOUNTER — Other Ambulatory Visit: Payer: Self-pay

## 2022-07-26 MED ORDER — METOPROLOL SUCCINATE ER 50 MG PO TB24: 50.0000 mg | ORAL_TABLET | Freq: Every day | ORAL | 3 refills | Status: DC

## 2022-08-15 DIAGNOSIS — I10 Essential (primary) hypertension: Secondary | ICD-10-CM | POA: Diagnosis not present

## 2022-08-15 DIAGNOSIS — G4719 Other hypersomnia: Secondary | ICD-10-CM | POA: Diagnosis not present

## 2022-08-15 DIAGNOSIS — L989 Disorder of the skin and subcutaneous tissue, unspecified: Secondary | ICD-10-CM | POA: Diagnosis not present

## 2022-08-15 DIAGNOSIS — I480 Paroxysmal atrial fibrillation: Secondary | ICD-10-CM | POA: Diagnosis not present

## 2022-08-15 DIAGNOSIS — Z6827 Body mass index (BMI) 27.0-27.9, adult: Secondary | ICD-10-CM | POA: Diagnosis not present

## 2022-08-15 DIAGNOSIS — E790 Hyperuricemia without signs of inflammatory arthritis and tophaceous disease: Secondary | ICD-10-CM | POA: Diagnosis not present

## 2022-08-17 NOTE — Therapy (Signed)
OUTPATIENT PHYSICAL THERAPY CERVICAL EVALUATION   Patient Name: Richard Lynch MRN: [id number] DOB:1951/01/15, 71 y.o., male Today's Date: 08/18/2022   PT End of Session - 08/18/22 0934     Visit Number 1    Date for PT Re-Evaluation 10/13/22    Authorization Type Medicare    PT Start Time 0932    PT Stop Time 1014    PT Time Calculation (min) 42 min    Activity Tolerance Patient tolerated treatment well             Past Medical History:  Diagnosis Date   Atrial fibrillation ([place])    Constipation due to opioid therapy 12/22/2015   Emphysema lung ([place])    Gait abnormality 02/20/2017   GERD (gastroesophageal reflux disease)    High cholesterol    Hypertension    OSA (obstructive sleep apnea)    on CPAP   Primary localized osteoarthritis of right knee 12/08/2015   Septic olecranon bursitis 06/2015   MSSA   Past Surgical History:  Procedure Laterality Date   COLONOSCOPY W/ BIOPSIES AND POLYPECTOMY     ELBOW SURGERY  08/2015   bilateral   EYE SURGERY     'repair of tear to left eye from pliers'   HAND SURGERY Left 08/2018   Carpal Tunnel Surgery- [place]  07/2010   KNEE SURGERY     3 times on both knee's each   LEFT HEART CATH AND CORONARY ANGIOGRAPHY N/A 04/11/2022   Procedure: LEFT HEART CATH AND CORONARY ANGIOGRAPHY;  Surgeon: [name], MD;  Location: [facility];  Service: Cardiovascular;  Laterality: N/A;   MULTIPLE TOOTH EXTRACTIONS     SHOULDER ARTHROSCOPY     TOTAL HIP ARTHROPLASTY  08/2010   TOTAL KNEE ARTHROPLASTY Right 12/20/2015   TOTAL KNEE ARTHROPLASTY Right 12/20/2015   Procedure: TOTAL KNEE ARTHROPLASTY;  Surgeon: [name], MD;  Location: [facility];  Service: Orthopedics;  Laterality: Right;   Patient Active Problem List   Diagnosis Date Noted   Coronary artery disease    Paroxysmal atrial fibrillation ([place]) 12/24/2019   Secondary hypercoagulable state ([place]) 12/24/2019   Dupuytren's contracture 07/02/2018    Spinal stenosis in cervical region 12/17/2017   Carpal tunnel syndrome of left wrist 11/27/2017   Radial nerve palsy, left 11/27/2017   Trigger ring finger of left hand 11/27/2017   Gait abnormality 02/20/2017   Restless leg syndrome 07/05/2016   Constipation due to opioid therapy 12/22/2015   Primary localized osteoarthritis of right knee 12/08/2015   Bullous emphysema ([place]) 11/02/2015   Primary hypertension 11/02/2015   Arthritis 11/02/2015   Hyperlipidemia 11/02/2015   OSA on CPAP 11/02/2015   GERD (gastroesophageal reflux disease) 11/02/2015   Septic olecranon bursitis 06/24/2015   Blepharochalasis 12/29/2014   Hidrocystoma of left eyelid 12/29/2014   Myogenic ptosis of eyelid of both eyes 12/29/2014   Multiple lung nodules on CT 06/30/2014   Combined senile cataract 12/24/2012   HSV epithelial keratitis 12/24/2012   Myopia with astigmatism and presbyopia 12/24/2012   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  Currently ruptured disc affecting C8 but surgery too dangerous now, could make the problem worse. Sharp shooting pain in neck and bil UEs especially left UE.  Left hand dominant.  Both hands a lot weaker, drop things often.  I get tired of shooting pains in arms and legs.  Can hold a pen 20 sec only.  Fatigue with hammering. Difficulty managing a 50# suitcase when traveling recently.  Patient reports my head feels heavy and hard to hold up.    PERTINENT  HISTORY:  2018-19:  C5-T3 posterior-lateral fusion with decompressive laminectomy from C5-6 to T1-2.  Per Dr. [name] office note: Chronic right hand weakness and paresthesias due to overlapping C8 radiculopathy and entrapment neuropathies. Exam is stable. 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 He has been off IVIG since December 2021.   He has sought second opinion at [facility], who agree that his hand paresthesias and weakness is mostly secondary to C8 radiculopathy with overlapping entrapment neuropathy.  He saw Neurosurgery at [facility] who advised that he is high risk for cervical surgery and recommended conservative therapies.  Management is supportive For pain, continue, continue gabapentin '1200mg'$  at bedtime.  For neck pain, restart tizanidine '2mg'$  BID as needed Start neck PT UPDATE 07/24/2022:  He feels that his hands are continuing to get weaker and often drops objects.  He denies any worsening numbness/tingling.  He also complains of chronic neck pain and stiffness.  He is not taking tizanidine, but does not recall why he stopped it.  Sometimes, he has shooting pain towards his head.  Balance is also poor.  No falls, walks unassisted.  Pain remains relatively controlled on gabapentin '1200mg'$  at bedtime.   HTN Bil Carpal tunnel releases 3 years ago had OT/PT and left elbow release HTN, A-fib, COPD PAIN:  PAIN:  Are you having pain? Yes NPRS scale: 2/10  much worse at times Pain location: back of the neck shoots up toward head left side worse, also down both arms especially left  Aggravating factors: reclined back;  after a lot of exertion (yardwork, playing golf) Relieving factors: lying down; sitting up straight   PRECAUTIONS: None "I can do what I feel comfortable doing."   WEIGHT BEARING RESTRICTIONS: No  FALLS:  Has patient  fallen in last 6 months? No but balance is horrible (outside in yard or golf course)   OCCUPATION: retired ; enjoys golf can do 18 holes   PLOF: Independent  PATIENT GOALS: better hand strength, help with neck pain after exertion  OBJECTIVE:   DIAGNOSTIC FINDINGS:  CT on 09/28/2021 IMPRESSION: 1. No acute displaced fracture or traumatic listhesis of the cervical spine in a patient with cervicothoracic posterolateral surgical hardware that is only partially visualized and originates at the C5 level. Persistent hardware lucency surrounding the and screws which is best evaluated on the coronal views and most prominent along the right C5 level. 2. Multilevel degenerative changes of the cervical spine with associated severe osseous neural foraminal stenosis of the non-surgerized levels. 3.  Emphysema (ICD10-J43.9).     NCS/EMG and US of the upper extremities 10/05/2021: There was electrodiagnostic and sonographic evidence of severe left median neuropathy at the wrist (carpal tunnel syndrome), left ulnar neuropathy with sonographic localization at the elbow as well as a left subchronic  C6-8 cervical radiculopathy. There was no electrodiagnostic evidence of a diffuse large fiber polyneuropathy. There appears to also be sonographic evidence consistent with right median neuropathy at the wrist, as may be seen in carpal tunnel syndrome. This was not fully explored on this study given the extensive nature of prior testing and clinical question asked, but could be explored further on a future study if clinically indicated. Clinical correlation advised.    NCS/EMG of the arms 04/01/2019 This is a complex study of the upper extremities.  Findings are as follows:  Bilateral median neuropathy at or distal to the wrist (severe), consistent with a clinical diagnosis of carpal tunnel syndrome.   Bilateral ulnar neuropathy with slowing across the elbow, mild on the right and severe on the left. Chronic  left posterior interosseous neuropathy, very severe.  Chronic C7-8 radiculopathy affecting bilateral upper extremities, moderate in degree electrically.   MRI cervical spine wo contrast 07/04/2018: 1. C5-T3 posterior-lateral fusion with decompressive laminectomy from C5-6 to T1-2. There is no arthrodesis by CT and the C5 and C6 lateral mass screws are loose. 2. Disc and facet degeneration causes multilevel foraminal impingement. Greatest foraminal impingement is seen on the left at C2-3, bilaterally at C3-4, right at C5-6, and right at T1-2. 3. Widely patent canal at levels of laminectomy. Noncompressive spinal stenosis seen at C3-4 and C4-5. 4. Septated fluid collection within the lower laminectomy defect without dural mass effect, best attributed to seroma.   MRI thoracic spine wo contrast 07/05/2018: Cervical and thoracic fusion and laminectomy. 15 mm fluid collection in the laminectomy bed. No cord compression or cord signal abnormality. Left paracentral disc protrusion T7-8 unchanged from the prior MRI PATIENT SURVEYS:  FOTO 54%  COGNITION: Overall cognitive status: Within functional limits for tasks assessed  SENSATION:  left 5th finger constantly numb, forearm 5-6 years bilaterally Noted atrophy left hypothenar eminence  POSTURE: rounded shoulders and forward head   Decreased cervical and flexor and extensor strength 4-/5 CERVICAL ROM:   Active ROM A/PROM (deg) eval  Flexion 34  Extension 22  Right lateral flexion 25  Left lateral flexion 20  Right rotation 40  Left rotation 20   (Blank rows = not tested)  UPPER EXTREMITY ROM:  grossly WFLS  UPPER EXTREMITY MMT:  MMT Right eval Left eval  Shoulder flexion 4+ 4-  Shoulder extension    Shoulder abduction 4+ 4-  Shoulder adduction    Shoulder extension    Shoulder internal rotation    Shoulder external rotation    Middle trapezius 4 4-  Lower trapezius 4 4-  Elbow flexion 5 4  Elbow extension 4 2+  Wrist  flexion  2+  Wrist extension  2+  Wrist ulnar deviation    Wrist radial deviation    Wrist pronation    Wrist supination    Grip strength 33 24   (Blank rows = not tested)  Pinch strength right 15#, left 5#  CERVICAL SPECIAL TESTS:  Upper limb tension test (ULTT): Negative and Distraction test: Positive  (seated gentle) No symptoms produced or changed but neural tension bil  TODAY'S TREATMENT:  DATE: 08/18/22   PATIENT EDUCATION:  Education details: plan of care  Person educated: Patient Education method: Explanation Education comprehension: verbalized understanding  HOME EXERCISE PROGRAM: To be started  ASSESSMENT:  CLINICAL IMPRESSION: Patient is a 71 y.o. male who was seen today for physical therapy evaluation and treatment for cervical radiculopathy and neuropathy. Hand paresthesias and weakness is mostly secondary to C8 radiculopathy with overlapping entrapment neuropathy and chronic for several years.  He is not a surgical candidate secondary to high risk.  He complains of neck pain with leaning back and after exertion. The patient would benefit from skilled PT to address decreased cervical range of motion, correct muscle strength asymmetries and weakness in deep cervical flexors and extensors, improve postural strength including and scapular retractor and depressors and address pain levels.  All affect patient's ability to perform ADLs like lifting, playing golf and fine motor activities.   OBJECTIVE IMPAIRMENTS: decreased activity tolerance, impaired perceived functional ability, impaired flexibility, impaired sensation, impaired UE functional use, postural dysfunction, and pain.   ACTIVITY LIMITATIONS: carrying, lifting, sitting, standing, and reach over head  PARTICIPATION LIMITATIONS: interpersonal relationship, community activity, and yard  work  PERSONAL FACTORS: Past/current experiences, Time since onset of injury/illness/exacerbation, and 3+ comorbidities: multi regions affected, HTN, A-fib, COPD  are also affecting patient's functional outcome.   REHAB POTENTIAL: Good  CLINICAL DECISION MAKING: Evolving/moderate complexity  EVALUATION COMPLEXITY: Moderate   GOALS: Goals reviewed with patient? Yes  SHORT TERM GOALS: Target date: 09/15/2022   The patient will demonstrate knowledge of basic self care strategies and exercises to promote healing   Baseline:  Goal status: INITIAL  2.  The patient will report a 30% improvement in pain levels with functional activities   Baseline:  Goal status: INITIAL  3.  Cervical flexors and extensors improved to 4/5 for improved tolerance with reclined sitting Baseline:  Goal status: INITIAL  4.  Shoulder/scapular strength improved to 4/5 needed for lifting/carrying medium objects for yardwork and traveling Baseline:  Goal status: INITIAL    LONG TERM GOALS: Target date: 10/13/2022  The patient will be independent in a safe self progression of a home exercise program to promote further recovery of function   Baseline:  Goal status: INITIAL  2.  The patient will report a 60% improvement in pain levels with functional activities  Baseline:  Goal status: INITIAL  3.  Cervical flexors and extensors improved to 4+/5 for improved tolerance with reclined sitting, playing golf Baseline:  Goal status: INITIAL  4.  Shoulder/scapular strength improved to 4+/5 needed for lifting/carrying medium to heavier objects for yardwork and suitcases for traveling Baseline:  Goal status: INITIAL  5.  The patient will have improved FOTO score to   59%    indicating improved function with less pain  Baseline:  Goal status: INITIAL   PLAN:  PT FREQUENCY: 1-2x/week  PT DURATION: 8 weeks  PLANNED INTERVENTIONS: Therapeutic exercises, Therapeutic activity, Neuromuscular re-education,  Balance training, Gait training, Patient/Family education, Self Care, Joint mobilization, Aquatic Therapy, Dry Needling, Spinal mobilization, Cryotherapy, Moist heat, Taping, Traction, Ultrasound, Manual therapy, and Re-evaluation  PLAN FOR NEXT SESSION: cervical muscle strengthening (isometrics first and progress to closed chain on counter and quadruped), proximal UE strengthening, possible DN to cervical multifidi, upper traps left > right and suboccipitals, pt to discuss with surgeon whether traction would be safe  Ruben Im, PT 08/18/22 2:00 PM Phone: [phone] Fax: [phone]

## 2022-08-18 ENCOUNTER — Other Ambulatory Visit: Payer: Self-pay

## 2022-08-18 ENCOUNTER — Ambulatory Visit: Payer: Medicare Other | Attending: Neurology | Admitting: Physical Therapy

## 2022-08-18 ENCOUNTER — Encounter: Payer: Self-pay | Admitting: Physical Therapy

## 2022-08-18 DIAGNOSIS — I4891 Unspecified atrial fibrillation: Secondary | ICD-10-CM | POA: Diagnosis not present

## 2022-08-18 DIAGNOSIS — J439 Emphysema, unspecified: Secondary | ICD-10-CM | POA: Insufficient documentation

## 2022-08-18 DIAGNOSIS — M6281 Muscle weakness (generalized): Secondary | ICD-10-CM

## 2022-08-18 DIAGNOSIS — M5412 Radiculopathy, cervical region: Secondary | ICD-10-CM | POA: Diagnosis not present

## 2022-08-18 DIAGNOSIS — J449 Chronic obstructive pulmonary disease, unspecified: Secondary | ICD-10-CM | POA: Diagnosis not present

## 2022-08-18 DIAGNOSIS — I10 Essential (primary) hypertension: Secondary | ICD-10-CM | POA: Diagnosis not present

## 2022-08-23 ENCOUNTER — Ambulatory Visit: Payer: Medicare Other | Attending: Neurology | Admitting: Physical Therapy

## 2022-08-23 DIAGNOSIS — M6281 Muscle weakness (generalized): Secondary | ICD-10-CM | POA: Insufficient documentation

## 2022-08-23 DIAGNOSIS — M5412 Radiculopathy, cervical region: Secondary | ICD-10-CM | POA: Diagnosis not present

## 2022-08-23 NOTE — Therapy (Signed)
OUTPATIENT PHYSICAL THERAPY CERVICAL PROGRESS NOTE   Patient Name: Richard Lynch MRN: [id number] DOB:27-Dec-1950, 71 y.o., male Today's Date: 08/18/2022   PT End of Session - 08/23/22 1526     Visit Number 2    Date for PT Re-Evaluation 10/13/22    Authorization Type Medicare    PT Start Time 1530    PT Stop Time 9518    PT Time Calculation (min) 43 min    Activity Tolerance Patient tolerated treatment well             Past Medical History:  Diagnosis Date   Atrial fibrillation ([place])    Constipation due to opioid therapy 12/22/2015   Emphysema lung ([place])    Gait abnormality 02/20/2017   GERD (gastroesophageal reflux disease)    High cholesterol    Hypertension    OSA (obstructive sleep apnea)    on CPAP   Primary localized osteoarthritis of right knee 12/08/2015   Septic olecranon bursitis 06/2015   MSSA   Past Surgical History:  Procedure Laterality Date   COLONOSCOPY W/ BIOPSIES AND POLYPECTOMY     ELBOW SURGERY  08/2015   bilateral   EYE SURGERY     'repair of tear to left eye from pliers'   HAND SURGERY Left 08/2018   Carpal Tunnel Surgery- [place]  07/2010   KNEE SURGERY     3 times on both knee's each   LEFT HEART CATH AND CORONARY ANGIOGRAPHY N/A 04/11/2022   Procedure: LEFT HEART CATH AND CORONARY ANGIOGRAPHY;  Surgeon: [name], MD;  Location: [facility];  Service: Cardiovascular;  Laterality: N/A;   MULTIPLE TOOTH EXTRACTIONS     SHOULDER ARTHROSCOPY     TOTAL HIP ARTHROPLASTY  08/2010   TOTAL KNEE ARTHROPLASTY Right 12/20/2015   TOTAL KNEE ARTHROPLASTY Right 12/20/2015   Procedure: TOTAL KNEE ARTHROPLASTY;  Surgeon: [name], MD;  Location: [facility];  Service: Orthopedics;  Laterality: Right;   Patient Active Problem List   Diagnosis Date Noted   Coronary artery disease    Paroxysmal atrial fibrillation ([place]) 12/24/2019   Secondary hypercoagulable state ([place]) 12/24/2019   Dupuytren's contracture 07/02/2018    Spinal stenosis in cervical region 12/17/2017   Carpal tunnel syndrome of left wrist 11/27/2017   Radial nerve palsy, left 11/27/2017   Trigger ring finger of left hand 11/27/2017   Gait abnormality 02/20/2017   Restless leg syndrome 07/05/2016   Constipation due to opioid therapy 12/22/2015   Primary localized osteoarthritis of right knee 12/08/2015   Bullous emphysema ([place]) 11/02/2015   Primary hypertension 11/02/2015   Arthritis 11/02/2015   Hyperlipidemia 11/02/2015   OSA on CPAP 11/02/2015   GERD (gastroesophageal reflux disease) 11/02/2015   Septic olecranon bursitis 06/24/2015   Blepharochalasis 12/29/2014   Hidrocystoma of left eyelid 12/29/2014   Myogenic ptosis of eyelid of both eyes 12/29/2014   Multiple lung nodules on CT 06/30/2014   Combined senile cataract 12/24/2012   HSV epithelial keratitis 12/24/2012   Myopia with astigmatism and presbyopia 12/24/2012   Neurotrophic ulcer ([place]) 12/24/2012    PCP: [name] MD  REFERRING PROVIDER: [name] DO  REFERRING DIAG: cervical radiculopathy  THERAPY DIAG:  Cervical radiculopathy, weakness  Rationale for Evaluation and Treatment: Rehabilitation  ONSET DATE: 10/23/2021  SUBJECTIVE:  SUBJECTIVE STATEMENT: No adverse response to initial evaluation. I'm tight as a rule.  The pain is when I relax at night.  Pt brought PT prescription from Dr. Marykay Lex from Nov 2022 with recommendation for manual traction, core strengthening, postural strengthening, cervical isometrics   PERTINENT HISTORY:  Injured neck 25 years ago;  Cervical fusion C5-T3 in 2019.  Currently ruptured disc affecting C8 but surgery too dangerous now, could make the problem worse. Sharp shooting pain in neck and bil UEs especially left UE.  Left hand  dominant.  Both hands a lot weaker, drop things often.  I get tired of shooting pains in arms and legs.  Can hold a pen 20 sec only.  Fatigue with hammering. Difficulty managing a 50# suitcase when traveling recently.  Patient reports my head feels heavy and hard to hold up.   2018-19:  C5-T3 posterior-lateral fusion with decompressive laminectomy from C5-6 to T1-2.  Per Dr. [name] office note: Chronic right hand weakness and paresthesias due to overlapping C8 radiculopathy and entrapment neuropathies. Exam is stable. 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 He has been off IVIG since December 2021.   He has sought second opinion at [facility], who agree that his hand paresthesias and weakness is mostly secondary to C8 radiculopathy with overlapping entrapment neuropathy.  He saw Neurosurgery at [facility] who advised that he is high risk for cervical surgery and recommended conservative therapies.  Management is supportive For pain, continue, continue gabapentin '1200mg'$  at bedtime.  For neck pain, restart tizanidine '2mg'$  BID as needed Start neck PT UPDATE 07/24/2022:  He feels that his hands are continuing to get weaker and often drops objects.  He denies any worsening numbness/tingling.  He also complains of chronic neck pain and stiffness.  He is not taking tizanidine, but does not recall why he stopped it.  Sometimes, he has shooting pain towards his head.  Balance is also poor.  No falls, walks unassisted.  Pain remains relatively controlled on gabapentin '1200mg'$  at bedtime.   HTN Bil Carpal tunnel releases 3 years ago had OT/PT and left elbow release HTN, A-fib, COPD PAIN:  PAIN:  Are you having pain? Yes NPRS scale: 2/10  much worse at times Pain location: back of the neck shoots up toward head left side worse, also down both arms especially  left  Aggravating factors: reclined back;  after a lot of exertion (yardwork, playing golf) Relieving factors: lying down; sitting up straight   PRECAUTIONS: None "I can do what I feel comfortable doing."   WEIGHT BEARING RESTRICTIONS: No  FALLS:  Has patient fallen in last 6 months? No but balance is horrible (outside in yard or golf course)   OCCUPATION: retired ; enjoys golf can do 18 holes   PLOF: Independent  PATIENT GOALS: better hand strength, help with neck pain after exertion  OBJECTIVE:   DIAGNOSTIC FINDINGS:  CT on 09/28/2021 IMPRESSION: 1. No acute displaced fracture or traumatic listhesis of the cervical spine in a patient with cervicothoracic posterolateral surgical hardware that is only partially visualized and originates at the C5 level. Persistent hardware lucency surrounding the and screws which is best evaluated on the coronal views and most prominent along the right C5 level. 2. Multilevel degenerative changes of the cervical spine with associated severe osseous neural foraminal stenosis of the non-surgerized levels. 3.  Emphysema (ICD10-J43.9).  NCS/EMG and US of the upper extremities 10/05/2021: There was electrodiagnostic and sonographic evidence of severe left median neuropathy at the wrist (carpal tunnel syndrome), left ulnar neuropathy with sonographic localization at the elbow as well as a left subchronic C6-8 cervical radiculopathy. There was no electrodiagnostic evidence of a diffuse large fiber polyneuropathy. There appears to also be sonographic evidence consistent with right median neuropathy at the wrist, as may be seen in carpal tunnel syndrome. This was not fully explored on this study given the extensive nature of prior testing and clinical question asked, but could be explored further on a future study if clinically indicated. Clinical correlation advised.    NCS/EMG of the arms 04/01/2019 This is a complex study of the upper extremities.   Findings are as follows:  Bilateral median neuropathy at or distal to the wrist (severe), consistent with a clinical diagnosis of carpal tunnel syndrome.   Bilateral ulnar neuropathy with slowing across the elbow, mild on the right and severe on the left. Chronic left posterior interosseous neuropathy, very severe.  Chronic C7-8 radiculopathy affecting bilateral upper extremities, moderate in degree electrically.   MRI cervical spine wo contrast 07/04/2018: 1. C5-T3 posterior-lateral fusion with decompressive laminectomy from C5-6 to T1-2. There is no arthrodesis by CT and the C5 and C6 lateral mass screws are loose. 2. Disc and facet degeneration causes multilevel foraminal impingement. Greatest foraminal impingement is seen on the left at C2-3, bilaterally at C3-4, right at C5-6, and right at T1-2. 3. Widely patent canal at levels of laminectomy. Noncompressive spinal stenosis seen at C3-4 and C4-5. 4. Septated fluid collection within the lower laminectomy defect without dural mass effect, best attributed to seroma.   MRI thoracic spine wo contrast 07/05/2018: Cervical and thoracic fusion and laminectomy. 15 mm fluid collection in the laminectomy bed. No cord compression or cord signal abnormality. Left paracentral disc protrusion T7-8 unchanged from the prior MRI PATIENT SURVEYS:  FOTO 54%  COGNITION: Overall cognitive status: Within functional limits for tasks assessed  SENSATION:  left 5th finger constantly numb, forearm 5-6 years bilaterally Noted atrophy left hypothenar eminence  POSTURE: rounded shoulders and forward head   Decreased cervical and flexor and extensor strength 4-/5 CERVICAL ROM:   Active ROM A/PROM (deg) eval  Flexion 34  Extension 22  Right lateral flexion 25  Left lateral flexion 20  Right rotation 40  Left rotation 20   (Blank rows = not tested)  UPPER EXTREMITY ROM:  grossly WFLS  UPPER EXTREMITY MMT:  MMT Right eval Left eval  Shoulder  flexion 4+ 4-  Shoulder extension    Shoulder abduction 4+ 4-  Shoulder adduction    Shoulder extension    Shoulder internal rotation    Shoulder external rotation    Middle trapezius 4 4-  Lower trapezius 4 4-  Elbow flexion 5 4  Elbow extension 4 2+  Wrist flexion  2+  Wrist extension  2+  Wrist ulnar deviation    Wrist radial deviation    Wrist pronation    Wrist supination    Grip strength 33 24   (Blank rows = not tested)  Pinch strength right 15#, left 5#  CERVICAL SPECIAL TESTS:  Upper limb tension test (ULTT): Negative and Distraction test: Positive  (seated gentle) No symptoms produced or changed but neural tension bil  TODAY'S TREATMENT:  DATE: 08/23/22  Manual therapy: soft tissue mobilization to bil cervical paraspinals, bil upper traps, bil suboccipitals; manual distraction gentle, suboccipital release Trigger Point Dry-Needling  Treatment instructions: Expect mild to moderate muscle soreness. S/S of pneumothorax if dry needled over a lung field, and to seek immediate medical attention should they occur. Patient verbalized understanding of these instructions and education.  Patient Consent Given: Yes Education handout provided: Yes Muscles treated: bil cervical multifidi, bil upper traps, bil suboccipitals Electrical stimulation performed: No Parameters: N/A Treatment response/outcome: improved soft tissue length Moist heat following concurrent with ex Supine: palms up shoulder press down 10x;  elbow press 10x Seated upper trap stretch with hand holding seat of the chair 30 sec holds;  seated cervical flexion with chin  tucked in 30 sec holds      PATIENT EDUCATION:  Education details: plan of care  Person educated: Patient Education method: Explanation Education comprehension: verbalized understanding  HOME EXERCISE  PROGRAM:  Access Code: [email or web address] URL: [email or web address] Date: 08/23/2022 Prepared by: Ruben Im  Exercises - Seated Upper Trapezius Stretch  - 1 x daily - 7 x weekly - 1 sets - 2-3 reps - 30 hold - Seated Cervical Flexion AROM  - 1 x daily - 7 x weekly - 1 sets - 2-3 reps - 30 hold - Seated Scapular Retraction  - 1 x daily - 7 x weekly - 1 sets - 10 reps - Supine Scapular Retraction  - 1 x daily - 7 x weekly - 1 sets - 10 reps  ASSESSMENT:  CLINICAL IMPRESSION: Initiated trial of manual therapy and dry needling to bil cervical musculature.  Improved soft tissue length noted in upper traps and suboccipitals.  The patient was encouraged in regular performance of HEP post DN including soft tissue lengthening and strengthening exercises to enhance long term benefit. Therapist monitoring response to all interventions.       OBJECTIVE IMPAIRMENTS: decreased activity tolerance, impaired perceived functional ability, impaired flexibility, impaired sensation, impaired UE functional use, postural dysfunction, and pain.   ACTIVITY LIMITATIONS: carrying, lifting, sitting, standing, and reach over head  PARTICIPATION LIMITATIONS: interpersonal relationship, community activity, and yard work  PERSONAL FACTORS: Past/current experiences, Time since onset of injury/illness/exacerbation, and 3+ comorbidities: multi regions affected, HTN, A-fib, COPD  are also affecting patient's functional outcome.   REHAB POTENTIAL: Good  CLINICAL DECISION MAKING: Evolving/moderate complexity  EVALUATION COMPLEXITY: Moderate   GOALS: Goals reviewed with patient? Yes  SHORT TERM GOALS: Target date: 09/15/2022   The patient will demonstrate knowledge of basic self care strategies and exercises to promote healing   Baseline:  Goal status: INITIAL  2.  The patient will report a 30% improvement in pain levels with functional activities   Baseline:  Goal status: INITIAL  3.  Cervical  flexors and extensors improved to 4/5 for improved tolerance with reclined sitting Baseline:  Goal status: INITIAL  4.  Shoulder/scapular strength improved to 4/5 needed for lifting/carrying medium objects for yardwork and traveling Baseline:  Goal status: INITIAL    LONG TERM GOALS: Target date: 10/13/2022  The patient will be independent in a safe self progression of a home exercise program to promote further recovery of function   Baseline:  Goal status: INITIAL  2.  The patient will report a 60% improvement in pain levels with functional activities  Baseline:  Goal status: INITIAL  3.  Cervical flexors and extensors improved to 4+/5 for improved tolerance with reclined sitting, playing golf Baseline:  Goal status: INITIAL  4.  Shoulder/scapular strength improved to 4+/5 needed for lifting/carrying medium to heavier objects for yardwork and suitcases for traveling Baseline:  Goal status: INITIAL  5.  The patient will have improved FOTO score to   59%    indicating improved function with less pain  Baseline:  Goal status: INITIAL   PLAN:  PT FREQUENCY: 1-2x/week  PT DURATION: 8 weeks  PLANNED INTERVENTIONS: Therapeutic exercises, Therapeutic activity, Neuromuscular re-education, Balance training, Gait training, Patient/Family education, Self Care, Joint mobilization, Aquatic Therapy, Dry Needling, Spinal mobilization, Cryotherapy, Moist heat, Taping, Traction, Ultrasound, Manual therapy, and Re-evaluation  PLAN FOR NEXT SESSION: cervical muscle strengthening (isometrics first and progress to closed chain on counter and quadruped), proximal UE strengthening, assess response to DN #1 to cervical multifidi, upper traps left > right and suboccipitals, pt to discuss with surgeon whether traction would be safe  Ruben Im, PT 08/23/22 4:38 PM Phone: [phone] Fax: [phone]

## 2022-08-23 NOTE — Patient Instructions (Signed)

## 2022-08-25 ENCOUNTER — Encounter: Payer: Self-pay | Admitting: Cardiology

## 2022-08-25 ENCOUNTER — Ambulatory Visit: Payer: Medicare Other | Admitting: Cardiology

## 2022-08-25 VITALS — BP 141/81 | HR 81 | Resp 16 | Ht 70.0 in | Wt 195.0 lb

## 2022-08-25 DIAGNOSIS — I2510 Atherosclerotic heart disease of native coronary artery without angina pectoris: Secondary | ICD-10-CM | POA: Diagnosis not present

## 2022-08-25 DIAGNOSIS — F17210 Nicotine dependence, cigarettes, uncomplicated: Secondary | ICD-10-CM | POA: Diagnosis not present

## 2022-08-25 DIAGNOSIS — I480 Paroxysmal atrial fibrillation: Secondary | ICD-10-CM | POA: Diagnosis not present

## 2022-08-25 MED ORDER — MULTAQ 400 MG PO TABS: 400.0000 mg | ORAL_TABLET | Freq: Two times a day (BID) | ORAL | 3 refills | Status: DC

## 2022-08-25 MED ORDER — ISOSORBIDE MONONITRATE ER 30 MG PO TB24: 30.0000 mg | ORAL_TABLET | Freq: Every day | ORAL | 3 refills | Status: DC

## 2022-08-25 MED ORDER — METOPROLOL SUCCINATE ER 50 MG PO TB24: 50.0000 mg | ORAL_TABLET | Freq: Every day | ORAL | 3 refills | Status: DC

## 2022-08-25 MED ORDER — NICOTINE 14 MG/24HR TD PT24: 14.0000 mg | MEDICATED_PATCH | Freq: Every day | TRANSDERMAL | 3 refills | Status: DC

## 2022-08-25 NOTE — Progress Notes (Signed)
Follow up visit  Subjective:   Richard Lynch, male    DOB: [birth year], 71 y.o.   MRN: [id number]    HPI  Chief Complaint  Patient presents with   Coronary Artery Disease   Follow-up    3 months    71 year old Caucasian male with hypertension, hyperlipidemia, CAD, PAD,  paroxysmal A-fib, OSA on CPAP, CIDP  Patient underwent coronary angiography in 03/2022, that showed mid RCA stenosis with adjacent ectatic areas.  His symptoms of decreased exercise tolerance seem to correlate with his episodes of A-fib.  At the time of coronary angiography, patient was on minimal antianginal management, and no rhythm control management for A-fib.  I started him on Imdur 30 mg daily, as well as multaq 400 mg twice daily, with plans for elective PCI to mid RCA, should symptoms not improve with optimal medical management.  Patient is here for follow-up today.  He has not had any episodes of A-fib since starting Multaq. He also denies chest pain, shortness of breath, palpitations, leg edema, orthopnea, PND, TIA/syncope. Unfortunately, he continues to smoke 1 pack a day.     Current Outpatient Medications:    allopurinol (ZYLOPRIM) 100 MG tablet, Take 200 mg by mouth daily., Disp: , Rfl:    amphetamine-dextroamphetamine (ADDERALL) 20 MG tablet, Take 20-30 mg by mouth daily as needed (sleepiness)., Disp: , Rfl:    apixaban (ELIQUIS) 5 MG TABS tablet, Take 1 tablet (5 mg total) by mouth 2 (two) times daily. Resume 04/12/2022 evening dose, Disp: 180 tablet, Rfl: 3   Ascorbic Acid (VITAMIN C PO), Take 600 mg by mouth daily., Disp: , Rfl:    aspirin EC 81 MG tablet, Take 1 tablet (81 mg total) by mouth daily., Disp: 90 tablet, Rfl: 3   atorvastatin (LIPITOR) 40 MG tablet, Take 40 mg by mouth daily., Disp: , Rfl:    B Complex-C (SUPER B COMPLEX PO), Take 1 tablet by mouth daily., Disp: , Rfl:    candesartan (ATACAND) 32 MG tablet, Take 32 mg by mouth daily. , Disp: , Rfl:    chlorthalidone (HYGROTON) 25 MG  tablet, Take 25 mg by mouth daily., Disp: , Rfl:    Cholecalciferol (VITAMIN D3) 50 MCG (2000 UT) capsule, Take 2,000 Units by mouth daily., Disp: , Rfl:    Coenzyme Q10 (COQ-10 PO), Take 100-300 mg by mouth daily., Disp: , Rfl:    Cyanocobalamin (VITAMIN B-12) 5000 MCG SUBL, Place 10,000 mcg under the tongue daily., Disp: , Rfl:    diltiazem (CARDIZEM) 30 MG tablet, Take 1 tablet every 4 hours AS NEEDED for AFIB heart rate >100, Disp: 45 tablet, Rfl: 1   dronedarone (MULTAQ) 400 MG tablet, Take 1 tablet (400 mg total) by mouth 2 (two) times daily with a meal., Disp: 180 tablet, Rfl: 3   gabapentin (NEURONTIN) 300 MG capsule, Take 4 capsules (1,200 mg total) by mouth See admin instructions., Disp: 360 capsule, Rfl: 3   isosorbide mononitrate (IMDUR) 30 MG 24 hr tablet, Take 1 tablet (30 mg total) by mouth daily., Disp: 90 tablet, Rfl: 3   metoprolol succinate (TOPROL-XL) 50 MG 24 hr tablet, Take 1 tablet (50 mg total) by mouth daily. Take with or immediately following a meal., Disp: 90 tablet, Rfl: 3   Multiple Vitamin (MULTIVITAMIN WITH MINERALS) TABS tablet, Take 1 tablet by mouth daily. Centrum silver, Disp: , Rfl:    SYMBICORT 160-4.5 MCG/ACT inhaler, Inhale 2 puffs into the lungs daily as needed (lungs)., Disp: , Rfl:  tiZANidine (ZANAFLEX) 2 MG tablet, Take 1-2 tablets (2-4 mg total) by mouth at bedtime as needed for muscle spasms., Disp: 60 tablet, Rfl: 5   Turmeric (QC TUMERIC COMPLEX PO), Take 1,500 mg by mouth daily., Disp: , Rfl:    valACYclovir (VALTREX) 500 MG tablet, Take 500 mg by mouth daily., Disp: , Rfl:    Zinc Sulfate (ZINC 15 PO), Take 50 mg by mouth daily., Disp: , Rfl:    Cardiovascular & other pertient studies:  Reviewed external labs and tests, independently interpreted  EKG 04/11/2022: Sinus rhythm 72 bpm Normal EKG  Coronary angiography 04/11/2022: LM: Normal LAD: Tortious vessel          Ostial 20% stenosis with mild calcification Lcx: Tortuous vessel. No  significant disease RCA: Large, dominant, tortuous vessel          80% proximal stenosis with adjacent aneurysmal areas           60% distal stenosis   Normal LVEDP   Single vessel obstructive disease Symptoms of decreased exercise tolerance could be due to combination of CAD as well as PAF Recommend aggressive medical therapy for both Added Imdur 30 mg and Multaq 400 mg bid  If no symptom improvement, recommend PCI to prox-mid RCA, followed by triple therapy for 1 month (ASA, plavix, eliquis), then Aspirin/plavix + eliquis  Exercise Myoview stress test 02/15/2022: Exercise nuclear stress test was performed using Bruce protocol.   1 Day Rest and Stress images. Exercise time 8 minutes 30 seconds, achieved 9.09 METS, 95% APMHR.  No chest pain during exercise. Hypertensive response to exercise (resting BP 122/78, peak BP 230/78). Stress ECG equivocal due to EKG changes (63m upsloping ST depression inferolateral leads which recovers 3 minutes into recovery, PAC/PVC).  Medium size, mild intensity, reversible perfusion defect involving basal to distal inferior segments suggestive of possible ischemia in the RCA distribution. Left ventricular size normal, LVEF 58%, no regional wall motion abnormalities. Intermediate risk study, clinical correlation required.    Recent labs: 04/06/2022: Glucose 128, BUN/Cr 23/1.0. EGFR 81. Na/K 143/4.3.  H/H 14/39. MCV 96. Platelets 280  07/2021: HbA1C 5.5%  05/2020: Chol 103, TG 65, HDL 49, LDL 39   Review of Systems  Cardiovascular:  Negative for chest pain, dyspnea on exertion, leg swelling, palpitations and syncope.         Vitals:   08/25/22 1033 08/25/22 1034  BP: (!) 153/80 (!) 141/81  Pulse: 79 81  Resp: 16   SpO2: 94% 95%     Body mass index is 27.98 kg/m. Filed Weights   08/25/22 1033  Weight: 195 lb (88.5 kg)      Objective:   Physical Exam Vitals and nursing note reviewed.  Constitutional:      General: He is not in  acute distress. Neck:     Vascular: No JVD.  Cardiovascular:     Rate and Rhythm: Normal rate and regular rhythm.     Heart sounds: Normal heart sounds. No murmur heard. Pulmonary:     Effort: Pulmonary effort is normal.     Breath sounds: Normal breath sounds. No wheezing or rales.  Musculoskeletal:     Right lower leg: No edema.     Left lower leg: No edema.             Visit diagnoses:   ICD-10-CM   1. Cigarette nicotine dependence without complication  FW73.710nicotine (NICODERM CQ - DOSED IN MG/24 HOURS) 14 mg/24hr patch    2. Coronary artery  disease involving native coronary artery of native heart with other form of angina pectoris ([place])  I25.118 metoprolol succinate (TOPROL-XL) 50 MG 24 hr tablet    isosorbide mononitrate (IMDUR) 30 MG 24 hr tablet    3. Paroxysmal atrial fibrillation ([facility])  I48.0 metoprolol succinate (TOPROL-XL) 50 MG 24 hr tablet    dronedarone (MULTAQ) 400 MG tablet         Meds ordered this encounter  Medications   metoprolol succinate (TOPROL-XL) 50 MG 24 hr tablet    Sig: Take 1 tablet (50 mg total) by mouth daily. Take with or immediately following a meal.    Dispense:  90 tablet    Refill:  3   isosorbide mononitrate (IMDUR) 30 MG 24 hr tablet    Sig: Take 1 tablet (30 mg total) by mouth daily.    Dispense:  90 tablet    Refill:  3   dronedarone (MULTAQ) 400 MG tablet    Sig: Take 1 tablet (400 mg total) by mouth 2 (two) times daily with a meal.    Dispense:  180 tablet    Refill:  3   nicotine (NICODERM CQ - DOSED IN MG/24 HOURS) 14 mg/24hr patch    Sig: Place 1 patch (14 mg total) onto the skin daily.    Dispense:  28 patch    Refill:  3     Assessment & Recommendations:   71 year old Caucasian male with hypertension, hyperlipidemia, CAD, PAD,  paroxysmal A-fib, OSA on CPAP, CIDP  Coronary artery disease, Single-vessel disease with severe mid RCA stenosis with adjacent ectasia. Good exercise capacity without chest pain,  but areas of medium size mild ischemia in distal RCA territory noted on stress testing 01/2022. Currently, he has no specific chest pain symptoms.  With rhythm control of A-fib with Multaq, he has not had any significant decreased exercise tolerance symptoms either.  Continue current anti anginal therapy and AAD. Continue aspirin 81 mg daily, Imdur 30 mg daily, metoprolol succinate 50 mg daily, Multaq 400 mg twice daily,  PAF: No recurrence of A-fib episode, as monitored by patient's Apple Watch, since being on Multaq 400 mg daily. CHA2DS2VAS score 3, annual stroke risk 3.2% Continue Eliquis 5 mg twice daily  Nicotine dependence: Tobacco cessation counseling:  - Currently smoking 1 packs/day   - Patient was informed of the dangers of tobacco abuse including stroke, cancer, and MI, as well as benefits of tobacco cessation. - Patient is willing to quit at this time. - Approximately 5 mins were spent counseling patient cessation techniques. We discussed various methods to help quit smoking, including deciding on a date to quit, joining a support group, pharmacological agents. Patient would like to use nicotine patch. - I will reassess his progress at the next follow-up visit     [name], MD Pager: [phone] Office: [phone]

## 2022-08-31 ENCOUNTER — Ambulatory Visit: Payer: Medicare Other | Admitting: Physical Therapy

## 2022-08-31 DIAGNOSIS — M6281 Muscle weakness (generalized): Secondary | ICD-10-CM | POA: Diagnosis not present

## 2022-08-31 DIAGNOSIS — M5412 Radiculopathy, cervical region: Secondary | ICD-10-CM

## 2022-08-31 NOTE — Therapy (Signed)
OUTPATIENT PHYSICAL THERAPY CERVICAL PROGRESS NOTE   Patient Name: Richard Lynch MRN: [id number] DOB:11/26/1950, 71 y.o., male Today's Date: 08/18/2022   PT End of Session - 08/31/22 0752     Visit Number 3    Date for PT Re-Evaluation 10/13/22    Authorization Type Medicare    PT Start Time 0755    PT Stop Time 0835    PT Time Calculation (min) 40 min    Activity Tolerance Patient tolerated treatment well             Past Medical History:  Diagnosis Date   Atrial fibrillation ([place])    Constipation due to opioid therapy 12/22/2015   Emphysema lung ([place])    Gait abnormality 02/20/2017   GERD (gastroesophageal reflux disease)    High cholesterol    Hypertension    OSA (obstructive sleep apnea)    on CPAP   Primary localized osteoarthritis of right knee 12/08/2015   Septic olecranon bursitis 06/2015   MSSA   Past Surgical History:  Procedure Laterality Date   COLONOSCOPY W/ BIOPSIES AND POLYPECTOMY     ELBOW SURGERY  08/2015   bilateral   EYE SURGERY     'repair of tear to left eye from pliers'   HAND SURGERY Left 08/2018   Carpal Tunnel Surgery- [place]  07/2010   KNEE SURGERY     3 times on both knee's each   LEFT HEART CATH AND CORONARY ANGIOGRAPHY N/A 04/11/2022   Procedure: LEFT HEART CATH AND CORONARY ANGIOGRAPHY;  Surgeon: [name], MD;  Location: [facility];  Service: Cardiovascular;  Laterality: N/A;   MULTIPLE TOOTH EXTRACTIONS     SHOULDER ARTHROSCOPY     TOTAL HIP ARTHROPLASTY  08/2010   TOTAL KNEE ARTHROPLASTY Right 12/20/2015   TOTAL KNEE ARTHROPLASTY Right 12/20/2015   Procedure: TOTAL KNEE ARTHROPLASTY;  Surgeon: [name], MD;  Location: [facility];  Service: Orthopedics;  Laterality: Right;   Patient Active Problem List   Diagnosis Date Noted   Cigarette nicotine dependence without complication 70/48/8891   Coronary artery disease    Paroxysmal atrial fibrillation ([place]) 12/24/2019   Secondary hypercoagulable  state ([place]) 12/24/2019   Dupuytren's contracture 07/02/2018   Spinal stenosis in cervical region 12/17/2017   Carpal tunnel syndrome of left wrist 11/27/2017   Radial nerve palsy, left 11/27/2017   Trigger ring finger of left hand 11/27/2017   Gait abnormality 02/20/2017   Restless leg syndrome 07/05/2016   Constipation due to opioid therapy 12/22/2015   Primary localized osteoarthritis of right knee 12/08/2015   Bullous emphysema ([place]) 11/02/2015   Primary hypertension 11/02/2015   Arthritis 11/02/2015   Hyperlipidemia 11/02/2015   OSA on CPAP 11/02/2015   GERD (gastroesophageal reflux disease) 11/02/2015   Septic olecranon bursitis 06/24/2015   Blepharochalasis 12/29/2014   Hidrocystoma of left eyelid 12/29/2014   Myogenic ptosis of eyelid of both eyes 12/29/2014   Multiple lung nodules on CT 06/30/2014   Combined senile cataract 12/24/2012   HSV epithelial keratitis 12/24/2012   Myopia with astigmatism and presbyopia 12/24/2012   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  Minor soreness. When I get in my chair I haven't had any of that shooting pain up my head.  I haven't done any ex's but I've shoveling, on the ladder and painting.  Soreness with turning to the right.     PERTINENT HISTORY:  Injured neck 25 years ago;  Cervical fusion C5-T3 in 2019.  Currently ruptured disc affecting C8 but surgery too dangerous now, could make the problem worse. Sharp shooting pain in neck and bil UEs especially  left UE.  Left hand dominant.  Both hands a lot weaker, drop things often.  I get tired of shooting pains in arms and legs.  Can hold a pen 20 sec only.  Fatigue with hammering. Difficulty managing a 50# suitcase when traveling recently.  Patient reports my head feels heavy and hard to hold up.   2018-19:  C5-T3 posterior-lateral fusion with decompressive laminectomy from C5-6 to T1-2.  Per Dr. [name] office note: Chronic right hand weakness and paresthesias due to overlapping C8 radiculopathy and entrapment neuropathies. Exam is stable. 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 He has been off IVIG since December 2021.   He has sought second opinion at [facility], who agree that his hand paresthesias and weakness is mostly secondary to C8 radiculopathy with overlapping entrapment neuropathy.  He saw Neurosurgery at [facility] who advised that he is high risk for cervical surgery and recommended conservative therapies.  Management is supportive For pain, continue, continue gabapentin '1200mg'$  at bedtime.  For neck pain, restart tizanidine '2mg'$  BID as needed Start neck PT UPDATE 07/24/2022:  He feels that his hands are continuing to get weaker and often drops objects.  He denies any worsening numbness/tingling.  He also complains of chronic neck pain and stiffness.  He is not taking tizanidine, but does not recall why he stopped it.  Sometimes, he has shooting pain towards his head.  Balance is also poor.  No falls, walks unassisted.  Pain remains relatively controlled on gabapentin '1200mg'$  at bedtime.   HTN Bil Carpal tunnel releases 3 years ago had OT/PT and left elbow release HTN, A-fib, COPD PAIN:  PAIN:  Are you having pain? Yes NPRS scale: 2/10   Pain location: back of the neck shoots up toward head left side worse, also down both arms especially  left  Aggravating factors: reclined back;  after a lot of exertion (yardwork, playing golf) Relieving factors: lying down; sitting up straight   PRECAUTIONS: None "I can do what I feel comfortable doing."   WEIGHT BEARING RESTRICTIONS: No  FALLS:  Has patient fallen in last 6 months? No but balance is horrible (outside in yard or golf course)   OCCUPATION: retired ; enjoys golf can do 18 holes   PLOF: Independent  PATIENT GOALS: better hand strength, help with neck pain after exertion  OBJECTIVE:   DIAGNOSTIC FINDINGS:  CT on 09/28/2021 IMPRESSION: 1. No acute displaced fracture or traumatic listhesis of the cervical spine in a patient with cervicothoracic posterolateral surgical hardware that is only partially visualized and originates at the C5 level. Persistent hardware lucency surrounding the and screws which is best evaluated on the coronal views and most prominent along the right C5 level. 2. Multilevel degenerative changes of the cervical spine with associated severe osseous neural foraminal stenosis of the non-surgerized levels. 3.  Emphysema (  ICD10-J43.9).     NCS/EMG and US of the upper extremities 10/05/2021: There was electrodiagnostic and sonographic evidence of severe left median neuropathy at the wrist (carpal tunnel syndrome), left ulnar neuropathy with sonographic localization at the elbow as well as a left subchronic C6-8 cervical radiculopathy. There was no electrodiagnostic evidence of a diffuse large fiber polyneuropathy. There appears to also be sonographic evidence consistent with right median neuropathy at the wrist, as may be seen in carpal tunnel syndrome. This was not fully explored on this study given the extensive nature of prior testing and clinical question asked, but could be explored further on a future study if clinically indicated. Clinical correlation advised.    NCS/EMG of the arms 04/01/2019 This is a complex study of the upper extremities.   Findings are as follows:  Bilateral median neuropathy at or distal to the wrist (severe), consistent with a clinical diagnosis of carpal tunnel syndrome.   Bilateral ulnar neuropathy with slowing across the elbow, mild on the right and severe on the left. Chronic left posterior interosseous neuropathy, very severe.  Chronic C7-8 radiculopathy affecting bilateral upper extremities, moderate in degree electrically.   MRI cervical spine wo contrast 07/04/2018: 1. C5-T3 posterior-lateral fusion with decompressive laminectomy from C5-6 to T1-2. There is no arthrodesis by CT and the C5 and C6 lateral mass screws are loose. 2. Disc and facet degeneration causes multilevel foraminal impingement. Greatest foraminal impingement is seen on the left at C2-3, bilaterally at C3-4, right at C5-6, and right at T1-2. 3. Widely patent canal at levels of laminectomy. Noncompressive spinal stenosis seen at C3-4 and C4-5. 4. Septated fluid collection within the lower laminectomy defect without dural mass effect, best attributed to seroma.   MRI thoracic spine wo contrast 07/05/2018: Cervical and thoracic fusion and laminectomy. 15 mm fluid collection in the laminectomy bed. No cord compression or cord signal abnormality. Left paracentral disc protrusion T7-8 unchanged from the prior MRI PATIENT SURVEYS:  FOTO 54%  COGNITION: Overall cognitive status: Within functional limits for tasks assessed  SENSATION:  left 5th finger constantly numb, forearm 5-6 years bilaterally Noted atrophy left hypothenar eminence  POSTURE: rounded shoulders and forward head   Decreased cervical and flexor and extensor strength 4-/5 CERVICAL ROM:   Active ROM A/PROM (deg) eval  Flexion 34  Extension 22  Right lateral flexion 25  Left lateral flexion 20  Right rotation 40  Left rotation 20   (Blank rows = not tested)  UPPER EXTREMITY ROM:  grossly WFLS  UPPER EXTREMITY MMT:  MMT Right eval Left eval  Shoulder  flexion 4+ 4-  Shoulder extension    Shoulder abduction 4+ 4-  Shoulder adduction    Shoulder extension    Shoulder internal rotation    Shoulder external rotation    Middle trapezius 4 4-  Lower trapezius 4 4-  Elbow flexion 5 4  Elbow extension 4 2+  Wrist flexion  2+  Wrist extension  2+  Wrist ulnar deviation    Wrist radial deviation    Wrist pronation    Wrist supination    Grip strength 33 24   (Blank rows = not tested)  Pinch strength right 15#, left 5#  CERVICAL SPECIAL TESTS:  Upper limb tension test (ULTT): Negative and Distraction test: Positive  (seated gentle) No symptoms produced or changed but neural tension bil  TODAY'S TREATMENT:   DATE: 08/31/22  Cervical isometrics: 5 sec hold flexion, extension, sidebending, rotation 5 reps each Standing neural flossing 10x  right/left Blue band rows 10x Blue band bil shoulder extension 10x Manual therapy: soft tissue mobilization to bil cervical paraspinals, bil upper traps, bil suboccipitals Trigger Point Dry-Needling  Treatment instructions: Expect mild to moderate muscle soreness. S/S of pneumothorax if dry needled over a lung field, and to seek immediate medical attention should they occur. Patient verbalized understanding of these instructions and education.  Patient Consent Given: Yes Education handout provided: Yes Muscles treated: bil cervical multifidi, bil upper traps, bil suboccipitals Electrical stimulation performed: No Parameters: N/A Treatment response/outcome: improved soft tissue length Moist heat 2 min while review of HEP                                                                                                                               DATE: 08/23/22  Manual therapy: soft tissue mobilization to bil cervical paraspinals, bil upper traps, bil suboccipitals; manual distraction gentle, suboccipital release Trigger Point Dry-Needling  Treatment instructions: Expect mild to moderate muscle  soreness. S/S of pneumothorax if dry needled over a lung field, and to seek immediate medical attention should they occur. Patient verbalized understanding of these instructions and education.  Patient Consent Given: Yes Education handout provided: Yes Muscles treated: bil cervical multifidi, bil upper traps, bil suboccipitals Electrical stimulation performed: No Parameters: N/A Treatment response/outcome: improved soft tissue length Moist heat following concurrent with ex Supine: palms up shoulder press down 10x;  elbow press 10x Seated upper trap stretch with hand holding seat of the chair 30 sec holds;  seated cervical flexion with chin  tucked in 30 sec holds      PATIENT EDUCATION:  Education details: plan of care  Person educated: Patient Education method: Explanation Education comprehension: verbalized understanding  HOME EXERCISE PROGRAM: Access Code: [email or web address] URL: [email or web address] Date: 08/31/2022 Prepared by: Ruben Im  Exercises - Seated Upper Trapezius Stretch  - 1 x daily - 7 x weekly - 1 sets - 2-3 reps - 30 hold - Seated Cervical Flexion AROM  - 1 x daily - 7 x weekly - 1 sets - 2-3 reps - 30 hold - Seated Scapular Retraction  - 1 x daily - 7 x weekly - 1 sets - 10 reps - Supine Scapular Retraction  - 1 x daily - 7 x weekly - 1 sets - 10 reps - Seated Isometric Cervical Flexion  - 1 x daily - 7 x weekly - 1 sets - 5 reps - 5 hold - Seated Isometric Cervical Extension  - 1 x daily - 7 x weekly - 1 sets - 5 reps - 5 hold - Seated Isometric Cervical Sidebending  - 1 x daily - 7 x weekly - 1 sets - 5 reps - 5 hold - Seated Isometric Cervical Rotation  - 1 x daily - 7 x weekly - 1 sets - 5 reps - 5 hold - Median Nerve Flossing  - 1 x daily - 7 x weekly - 1 sets - 10  reps - Standing Row with Anchored Resistance  - 1 x daily - 7 x weekly - 3 sets - 10 reps - Shoulder Extension with Resistance Hands Down  - 1 x daily - 7 x weekly - 3 sets - 10  reps  ASSESSMENT:  CLINICAL IMPRESSION: The patient had an excellent initial response to DN with no further episodes of sharp shooting pain with sitting this week.  Discussed areas of emphasis for exercise and updated/progressed HEP.  The patient was encouraged in regular performance of HEP post DN including soft tissue lengthening and strengthening exercises to enhance long term benefit.      OBJECTIVE IMPAIRMENTS: decreased activity tolerance, impaired perceived functional ability, impaired flexibility, impaired sensation, impaired UE functional use, postural dysfunction, and pain.   ACTIVITY LIMITATIONS: carrying, lifting, sitting, standing, and reach over head  PARTICIPATION LIMITATIONS: interpersonal relationship, community activity, and yard work  PERSONAL FACTORS: Past/current experiences, Time since onset of injury/illness/exacerbation, and 3+ comorbidities: multi regions affected, HTN, A-fib, COPD  are also affecting patient's functional outcome.   REHAB POTENTIAL: Good  CLINICAL DECISION MAKING: Evolving/moderate complexity  EVALUATION COMPLEXITY: Moderate   GOALS: Goals reviewed with patient? Yes  SHORT TERM GOALS: Target date: 09/15/2022   The patient will demonstrate knowledge of basic self care strategies and exercises to promote healing   Baseline:  Goal status: INITIAL  2.  The patient will report a 30% improvement in pain levels with functional activities   Baseline:  Goal status: INITIAL  3.  Cervical flexors and extensors improved to 4/5 for improved tolerance with reclined sitting Baseline:  Goal status: INITIAL  4.  Shoulder/scapular strength improved to 4/5 needed for lifting/carrying medium objects for yardwork and traveling Baseline:  Goal status: INITIAL    LONG TERM GOALS: Target date: 10/13/2022  The patient will be independent in a safe self progression of a home exercise program to promote further recovery of function   Baseline:  Goal  status: INITIAL  2.  The patient will report a 60% improvement in pain levels with functional activities  Baseline:  Goal status: INITIAL  3.  Cervical flexors and extensors improved to 4+/5 for improved tolerance with reclined sitting, playing golf Baseline:  Goal status: INITIAL  4.  Shoulder/scapular strength improved to 4+/5 needed for lifting/carrying medium to heavier objects for yardwork and suitcases for traveling Baseline:  Goal status: INITIAL  5.  The patient will have improved FOTO score to   59%    indicating improved function with less pain  Baseline:  Goal status: INITIAL   PLAN:  PT FREQUENCY: 1-2x/week  PT DURATION: 8 weeks  PLANNED INTERVENTIONS: Therapeutic exercises, Therapeutic activity, Neuromuscular re-education, Balance training, Gait training, Patient/Family education, Self Care, Joint mobilization, Aquatic Therapy, Dry Needling, Spinal mobilization, Cryotherapy, Moist heat, Taping, Traction, Ultrasound, Manual therapy, and Re-evaluation  PLAN FOR NEXT SESSION: recheck cervical ROM;  cervical muscle strengthening , try UBE and lat bar, counter push ups or quadruped; proximal UE strengthening, assess response to DN cervical multifidi, upper traps left > right and suboccipitals, pt to discuss with surgeon whether traction would be safe  Ruben Im, PT 08/31/22 11:40 AM Phone: [phone] Fax: [phone]

## 2022-09-05 NOTE — Telephone Encounter (Signed)
error 

## 2022-09-06 ENCOUNTER — Ambulatory Visit: Payer: Medicare Other | Admitting: Physical Therapy

## 2022-09-06 DIAGNOSIS — M5412 Radiculopathy, cervical region: Secondary | ICD-10-CM

## 2022-09-06 DIAGNOSIS — M6281 Muscle weakness (generalized): Secondary | ICD-10-CM | POA: Diagnosis not present

## 2022-09-06 NOTE — Therapy (Signed)
OUTPATIENT PHYSICAL THERAPY CERVICAL PROGRESS NOTE   Patient Name: Richard Lynch MRN: [id number] DOB:[birth year], 71 y.o., male Today's Date: 08/18/2022   PT End of Session - 09/06/22 1011     Visit Number 4    Date for PT Re-Evaluation 10/13/22    Authorization Type Medicare    PT Start Time 1013    PT Stop Time 1053    PT Time Calculation (min) 40 min    Activity Tolerance Patient tolerated treatment well             Past Medical History:  Diagnosis Date   Atrial fibrillation ([place])    Constipation due to opioid therapy 12/22/2015   Emphysema lung ([place])    Gait abnormality 02/20/2017   GERD (gastroesophageal reflux disease)    High cholesterol    Hypertension    OSA (obstructive sleep apnea)    on CPAP   Primary localized osteoarthritis of right knee 12/08/2015   Septic olecranon bursitis 06/2015   MSSA   Past Surgical History:  Procedure Laterality Date   COLONOSCOPY W/ BIOPSIES AND POLYPECTOMY     ELBOW SURGERY  08/2015   bilateral   EYE SURGERY     'repair of tear to left eye from pliers'   HAND SURGERY Left 08/2018   Carpal Tunnel Surgery- [place]  07/2010   KNEE SURGERY     3 times on both knee's each   LEFT HEART CATH AND CORONARY ANGIOGRAPHY N/A 04/11/2022   Procedure: LEFT HEART CATH AND CORONARY ANGIOGRAPHY;  Surgeon: [name], MD;  Location: [facility];  Service: Cardiovascular;  Laterality: N/A;   MULTIPLE TOOTH EXTRACTIONS     SHOULDER ARTHROSCOPY     TOTAL HIP ARTHROPLASTY  08/2010   TOTAL KNEE ARTHROPLASTY Right 12/20/2015   TOTAL KNEE ARTHROPLASTY Right 12/20/2015   Procedure: TOTAL KNEE ARTHROPLASTY;  Surgeon: [name], MD;  Location: [place] [facility];  Service: Orthopedics;  Laterality: Right;   Patient Active Problem List   Diagnosis Date Noted   Cigarette nicotine dependence without complication 09/38/1829   Coronary artery disease    Paroxysmal atrial fibrillation ([place]) 12/24/2019   Secondary hypercoagulable  state ([place]) 12/24/2019   Dupuytren's contracture 07/02/2018   Spinal stenosis in cervical region 12/17/2017   Carpal tunnel syndrome of left wrist 11/27/2017   Radial nerve palsy, left 11/27/2017   Trigger ring finger of left hand 11/27/2017   Gait abnormality 02/20/2017   Restless leg syndrome 07/05/2016   Constipation due to opioid therapy 12/22/2015   Primary localized osteoarthritis of right knee 12/08/2015   Bullous emphysema ([place]) 11/02/2015   Primary hypertension 11/02/2015   Arthritis 11/02/2015   Hyperlipidemia 11/02/2015   OSA on CPAP 11/02/2015   GERD (gastroesophageal reflux disease) 11/02/2015   Septic olecranon bursitis 06/24/2015   Blepharochalasis 12/29/2014   Hidrocystoma of left eyelid 12/29/2014   Myogenic ptosis of eyelid of both eyes 12/29/2014   Multiple lung nodules on CT 06/30/2014   Combined senile cataract 12/24/2012   HSV epithelial keratitis 12/24/2012   Myopia with astigmatism and presbyopia 12/24/2012   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  No sharp shooting pains with sitting.  I like doing the stretch band.  I carried my ex's with me (to the beach).    PERTINENT HISTORY:  Injured neck 25 years ago;  Cervical fusion C5-T3 in 2019.  Currently ruptured disc affecting C8 but surgery too dangerous now, could make the problem worse. Sharp shooting pain in neck and bil UEs especially left UE.  Left hand dominant.   Both hands a lot weaker, drop things often.  I get tired of shooting pains in arms and legs.  Can hold a pen 20 sec only.  Fatigue with hammering. Difficulty managing a 50# suitcase when traveling recently.  Patient reports my head feels heavy and hard to hold up.   2018-19:  C5-T3 posterior-lateral fusion with decompressive laminectomy from C5-6 to T1-2.  Per Dr. [name] office note: Chronic right hand weakness and paresthesias due to overlapping C8 radiculopathy and entrapment neuropathies. Exam is stable. 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 He has been off IVIG since December 2021.   He has sought second opinion at [place], who agree that his hand paresthesias and weakness is mostly secondary to C8 radiculopathy with overlapping entrapment neuropathy.  He saw Neurosurgery at [facility] who advised that he is high risk for cervical surgery and recommended conservative therapies.  Management is supportive For pain, continue, continue gabapentin '1200mg'$  at bedtime.  For neck pain, restart tizanidine '2mg'$  BID as needed Start neck PT UPDATE 07/24/2022:  He feels that his hands are continuing to get weaker and often drops objects.  He denies any worsening numbness/tingling.  He also complains of chronic neck pain and stiffness.  He is not taking tizanidine, but does not recall why he stopped it.  Sometimes, he has shooting pain towards his head.  Balance is also poor.  No falls, walks unassisted.  Pain remains relatively controlled on gabapentin '1200mg'$  at bedtime.   HTN Bil Carpal tunnel releases 3 years ago had OT/PT and left elbow release HTN, A-fib, COPD PAIN:  PAIN:  Are you having pain? Yes NPRS scale: 1/10   Pain location: back of the neck shoots up toward head left side worse, also down both arms especially left  Aggravating factors:  reclined back;  after a lot of exertion (yardwork, playing golf) Relieving factors: lying down; sitting up straight   PRECAUTIONS: None "I can do what I feel comfortable doing."   WEIGHT BEARING RESTRICTIONS: No  FALLS:  Has patient fallen in last 6 months? No but balance is horrible (outside in yard or golf course)   OCCUPATION: retired ; enjoys golf can do 18 holes   PLOF: Independent  PATIENT GOALS: better hand strength, help with neck pain after exertion  OBJECTIVE:   DIAGNOSTIC FINDINGS:  CT on 09/28/2021 IMPRESSION: 1. No acute displaced fracture or traumatic listhesis of the cervical spine in a patient with cervicothoracic posterolateral surgical hardware that is only partially visualized and originates at the C5 level. Persistent hardware lucency surrounding the and screws which is best evaluated on the coronal views and most prominent along the right C5 level. 2. Multilevel degenerative changes of the cervical spine with associated severe osseous neural foraminal stenosis of the non-surgerized levels. 3.  Emphysema (ICD10-J43.9).     NCS/EMG and  US of the upper extremities 10/05/2021: There was electrodiagnostic and sonographic evidence of severe left median neuropathy at the wrist (carpal tunnel syndrome), left ulnar neuropathy with sonographic localization at the elbow as well as a left subchronic C6-8 cervical radiculopathy. There was no electrodiagnostic evidence of a diffuse large fiber polyneuropathy. There appears to also be sonographic evidence consistent with right median neuropathy at the wrist, as may be seen in carpal tunnel syndrome. This was not fully explored on this study given the extensive nature of prior testing and clinical question asked, but could be explored further on a future study if clinically indicated. Clinical correlation advised.    NCS/EMG of the arms 04/01/2019 This is a complex study of the upper extremities.  Findings are as follows:   Bilateral median neuropathy at or distal to the wrist (severe), consistent with a clinical diagnosis of carpal tunnel syndrome.   Bilateral ulnar neuropathy with slowing across the elbow, mild on the right and severe on the left. Chronic left posterior interosseous neuropathy, very severe.  Chronic C7-8 radiculopathy affecting bilateral upper extremities, moderate in degree electrically.   MRI cervical spine wo contrast 07/04/2018: 1. C5-T3 posterior-lateral fusion with decompressive laminectomy from C5-6 to T1-2. There is no arthrodesis by CT and the C5 and C6 lateral mass screws are loose. 2. Disc and facet degeneration causes multilevel foraminal impingement. Greatest foraminal impingement is seen on the left at C2-3, bilaterally at C3-4, right at C5-6, and right at T1-2. 3. Widely patent canal at levels of laminectomy. Noncompressive spinal stenosis seen at C3-4 and C4-5. 4. Septated fluid collection within the lower laminectomy defect without dural mass effect, best attributed to seroma.   MRI thoracic spine wo contrast 07/05/2018: Cervical and thoracic fusion and laminectomy. 15 mm fluid collection in the laminectomy bed. No cord compression or cord signal abnormality. Left paracentral disc protrusion T7-8 unchanged from the prior MRI PATIENT SURVEYS:  FOTO 54%  COGNITION: Overall cognitive status: Within functional limits for tasks assessed  SENSATION:  left 5th finger constantly numb, forearm 5-6 years bilaterally Noted atrophy left hypothenar eminence  POSTURE: rounded shoulders and forward head   Decreased cervical and flexor and extensor strength 4-/5 CERVICAL ROM:   Active ROM A/PROM (deg) eval 11/15  Flexion 34 36  Extension 22 40  Right lateral flexion 25 31  Left lateral flexion 20 43  Right rotation 40 55  Left rotation 20 50   (Blank rows = not tested)  UPPER EXTREMITY ROM:  grossly WFLS  UPPER EXTREMITY MMT:  MMT Right eval Left eval  Shoulder flexion  4+ 4-  Shoulder extension    Shoulder abduction 4+ 4-  Shoulder adduction    Shoulder extension    Shoulder internal rotation    Shoulder external rotation    Middle trapezius 4 4-  Lower trapezius 4 4-  Elbow flexion 5 4  Elbow extension 4 2+  Wrist flexion  2+  Wrist extension  2+  Wrist ulnar deviation    Wrist radial deviation    Wrist pronation    Wrist supination    Grip strength 33 24   (Blank rows = not tested)  Pinch strength right 15#, left 5#  CERVICAL SPECIAL TESTS:  Upper limb tension test (ULTT): Negative and Distraction test: Positive  (seated gentle) No symptoms produced or changed but neural tension bil   TODAY'S TREATMENT:   DATE: 09/06/22  UBE 5 min forward and backward Standing neural flossing 10x right/left Cable row 15# 20x  Lat bar 40# 20x Wrist extension on towel roll on mat table  Discussed local gym options  Manual therapy: soft tissue mobilization to bil cervical paraspinals, bil upper traps, bil suboccipitals, right levator scap Trigger Point Dry-Needling  Treatment instructions: Expect mild to moderate muscle soreness. S/S of pneumothorax if dry needled over a lung field, and to seek immediate medical attention should they occur. Patient verbalized understanding of these instructions and education.  Patient Consent Given: Yes Education handout provided: Yes Muscles treated: bil cervical multifidi, bil upper traps, right levator scap; bil suboccipitals Electrical stimulation performed: No Parameters: N/A Treatment response/outcome: improved soft tissue length   TODAY'S TREATMENT:   DATE: 08/31/22  Cervical isometrics: 5 sec hold flexion, extension, sidebending, rotation 5 reps each Standing neural flossing 10x right/left Blue band rows 10x Blue band bil shoulder extension 10x Manual therapy: soft tissue mobilization to bil cervical paraspinals, bil upper traps, bil suboccipitals Trigger Point Dry-Needling  Treatment instructions: Expect  mild to moderate muscle soreness. S/S of pneumothorax if dry needled over a lung field, and to seek immediate medical attention should they occur. Patient verbalized understanding of these instructions and education.  Patient Consent Given: Yes Education handout provided: Yes Muscles treated: bil cervical multifidi, bil upper traps, bil suboccipitals Electrical stimulation performed: No Parameters: N/A Treatment response/outcome: improved soft tissue length Moist heat 2 min while review of HEP                                                                                                                           PATIENT EDUCATION:  Education details: plan of care  Person educated: Patient Education method: Explanation Education comprehension: verbalized understanding  HOME EXERCISE PROGRAM: Access Code: [email or web address] URL: [email or web address] Date: 08/31/2022 Prepared by: Ruben Im  Exercises - Seated Upper Trapezius Stretch  - 1 x daily - 7 x weekly - 1 sets - 2-3 reps - 30 hold - Seated Cervical Flexion AROM  - 1 x daily - 7 x weekly - 1 sets - 2-3 reps - 30 hold - Seated Scapular Retraction  - 1 x daily - 7 x weekly - 1 sets - 10 reps - Supine Scapular Retraction  - 1 x daily - 7 x weekly - 1 sets - 10 reps - Seated Isometric Cervical Flexion  - 1 x daily - 7 x weekly - 1 sets - 5 reps - 5 hold - Seated Isometric Cervical Extension  - 1 x daily - 7 x weekly - 1 sets - 5 reps - 5 hold - Seated Isometric Cervical Sidebending  - 1 x daily - 7 x weekly - 1 sets - 5 reps - 5 hold - Seated Isometric Cervical Rotation  - 1 x daily - 7 x weekly - 1 sets - 5 reps - 5 hold - Median Nerve Flossing  - 1 x daily - 7 x weekly - 1 sets - 10 reps - Standing Row with Anchored Resistance  - 1 x  daily - 7 x weekly - 3 sets - 10 reps - Shoulder Extension with Resistance Hands Down  - 1 x daily - 7 x weekly - 3 sets - 10 reps  ASSESSMENT:  CLINICAL IMPRESSION: Significant  improvements in cervical ROM in all planes.  Progression of strengthening to target periscapular and cervical muscles without exacerbation of pain.   Stiffness with bil wrist extension noted.  Responds well to DN and manual therapy with much improved soft tissue mobility noted.  Therapist monitoring response to all interventions and modifying treatment accordingly.     OBJECTIVE IMPAIRMENTS: decreased activity tolerance, impaired perceived functional ability, impaired flexibility, impaired sensation, impaired UE functional use, postural dysfunction, and pain.   ACTIVITY LIMITATIONS: carrying, lifting, sitting, standing, and reach over head  PARTICIPATION LIMITATIONS: interpersonal relationship, community activity, and yard work  PERSONAL FACTORS: Past/current experiences, Time since onset of injury/illness/exacerbation, and 3+ comorbidities: multi regions affected, HTN, A-fib, COPD  are also affecting patient's functional outcome.   REHAB POTENTIAL: Good  CLINICAL DECISION MAKING: Evolving/moderate complexity  EVALUATION COMPLEXITY: Moderate   GOALS: Goals reviewed with patient? Yes  SHORT TERM GOALS: Target date: 09/15/2022   The patient will demonstrate knowledge of basic self care strategies and exercises to promote healing   Baseline:  Goal status: INITIAL  2.  The patient will report a 30% improvement in pain levels with functional activities   Baseline:  Goal status: INITIAL  3.  Cervical flexors and extensors improved to 4/5 for improved tolerance with reclined sitting Baseline:  Goal status: INITIAL  4.  Shoulder/scapular strength improved to 4/5 needed for lifting/carrying medium objects for yardwork and traveling Baseline:  Goal status: INITIAL    LONG TERM GOALS: Target date: 10/13/2022  The patient will be independent in a safe self progression of a home exercise program to promote further recovery of function   Baseline:  Goal status: INITIAL  2.  The  patient will report a 60% improvement in pain levels with functional activities  Baseline:  Goal status: INITIAL  3.  Cervical flexors and extensors improved to 4+/5 for improved tolerance with reclined sitting, playing golf Baseline:  Goal status: INITIAL  4.  Shoulder/scapular strength improved to 4+/5 needed for lifting/carrying medium to heavier objects for yardwork and suitcases for traveling Baseline:  Goal status: INITIAL  5.  The patient will have improved FOTO score to   59%    indicating improved function with less pain  Baseline:  Goal status: INITIAL   PLAN:  PT FREQUENCY: 1-2x/week  PT DURATION: 8 weeks  PLANNED INTERVENTIONS: Therapeutic exercises, Therapeutic activity, Neuromuscular re-education, Balance training, Gait training, Patient/Family education, Self Care, Joint mobilization, Aquatic Therapy, Dry Needling, Spinal mobilization, Cryotherapy, Moist heat, Taping, Traction, Ultrasound, Manual therapy, and Re-evaluation  PLAN FOR NEXT SESSION:  cervical muscle strengthening ,  UBE and lat bar, counter push ups or quadruped; proximal UE strengthening, assess response to DN cervical multifidi, upper traps left > right and suboccipitals; manual traction light    Ruben Im, PT 09/06/22 11:00 AM Phone: [phone] Fax: [phone]

## 2022-09-08 ENCOUNTER — Ambulatory Visit: Payer: Medicare Other | Admitting: Physical Therapy

## 2022-09-08 DIAGNOSIS — M5412 Radiculopathy, cervical region: Secondary | ICD-10-CM | POA: Diagnosis not present

## 2022-09-08 DIAGNOSIS — M6281 Muscle weakness (generalized): Secondary | ICD-10-CM | POA: Diagnosis not present

## 2022-09-08 NOTE — Therapy (Signed)
OUTPATIENT PHYSICAL THERAPY CERVICAL PROGRESS NOTE   Patient Name: Richard Lynch MRN: [id number] DOB:[birth year], 71 y.o., male Today's Date: 08/18/2022  PT End of Session - 09/08/22 0938     Visit Number 5    Date for PT Re-Evaluation 10/13/22    Authorization Type Medicare    PT Start Time 0936    PT Stop Time 1015    PT Time Calculation (min) 39 min    Activity Tolerance Patient tolerated treatment well               Past Medical History:  Diagnosis Date   Atrial fibrillation ([place])    Constipation due to opioid therapy 12/22/2015   Emphysema lung ([place])    Gait abnormality 02/20/2017   GERD (gastroesophageal reflux disease)    High cholesterol    Hypertension    OSA (obstructive sleep apnea)    on CPAP   Primary localized osteoarthritis of right knee 12/08/2015   Septic olecranon bursitis 06/2015   MSSA   Past Surgical History:  Procedure Laterality Date   COLONOSCOPY W/ BIOPSIES AND POLYPECTOMY     ELBOW SURGERY  08/2015   bilateral   EYE SURGERY     'repair of tear to left eye from pliers'   HAND SURGERY Left 08/2018   Carpal Tunnel Surgery- [place]  07/2010   KNEE SURGERY     3 times on both knee's each   LEFT HEART CATH AND CORONARY ANGIOGRAPHY N/A 04/11/2022   Procedure: LEFT HEART CATH AND CORONARY ANGIOGRAPHY;  Surgeon: [name], MD;  Location: [facility];  Service: Cardiovascular;  Laterality: N/A;   MULTIPLE TOOTH EXTRACTIONS     SHOULDER ARTHROSCOPY     TOTAL HIP ARTHROPLASTY  08/2010   TOTAL KNEE ARTHROPLASTY Right 12/20/2015   TOTAL KNEE ARTHROPLASTY Right 12/20/2015   Procedure: TOTAL KNEE ARTHROPLASTY;  Surgeon: [name], MD;  Location: [facility];  Service: Orthopedics;  Laterality: Right;   Patient Active Problem List   Diagnosis Date Noted   Cigarette nicotine dependence without complication 59/74/1638   Coronary artery disease    Paroxysmal atrial fibrillation ([place]) 12/24/2019   Secondary hypercoagulable  state ([place]) 12/24/2019   Dupuytren's contracture 07/02/2018   Spinal stenosis in cervical region 12/17/2017   Carpal tunnel syndrome of left wrist 11/27/2017   Radial nerve palsy, left 11/27/2017   Trigger ring finger of left hand 11/27/2017   Gait abnormality 02/20/2017   Restless leg syndrome 07/05/2016   Constipation due to opioid therapy 12/22/2015   Primary localized osteoarthritis of right knee 12/08/2015   Bullous emphysema ([place]) 11/02/2015   Primary hypertension 11/02/2015   Arthritis 11/02/2015   Hyperlipidemia 11/02/2015   OSA on CPAP 11/02/2015   GERD (gastroesophageal reflux disease) 11/02/2015   Septic olecranon bursitis 06/24/2015   Blepharochalasis 12/29/2014   Hidrocystoma of left eyelid 12/29/2014   Myogenic ptosis of eyelid of both eyes 12/29/2014   Multiple lung nodules on CT 06/30/2014   Combined senile cataract 12/24/2012   HSV epithelial keratitis 12/24/2012   Myopia with astigmatism and presbyopia 12/24/2012   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  Still no sharp shooting pains.   The DN has helped the most of anything.  My balance is not good, what do you think is the problem?  PERTINENT HISTORY:  Both feet numb c/o dec balance Injured neck 25 years ago;  Cervical fusion C5-T3 in 2019.  Currently ruptured disc affecting C8 but surgery too dangerous now, could make the problem worse. Sharp shooting pain in neck and bil UEs especially left UE.  Left hand dominant.   Both hands a lot weaker, drop things often.  I get tired of shooting pains in arms and legs.  Can hold a pen 20 sec only.  Fatigue with hammering. Difficulty managing a 50# suitcase when traveling recently.  Patient reports my head feels heavy and hard to hold up.   2018-19:  C5-T3 posterior-lateral fusion with decompressive laminectomy from C5-6 to T1-2.  Per Dr. [name] office note: Chronic right hand weakness and paresthesias due to overlapping C8 radiculopathy and entrapment neuropathies. Exam is stable. 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 He has been off IVIG since December 2021.   He has sought second opinion at [facility], who agree that his hand paresthesias and weakness is mostly secondary to C8 radiculopathy with overlapping entrapment neuropathy.  He saw Neurosurgery at [facility] who advised that he is high risk for cervical surgery and recommended conservative therapies.  Management is supportive For pain, continue, continue gabapentin '1200mg'$  at bedtime.  For neck pain, restart tizanidine '2mg'$  BID as needed Start neck PT UPDATE 07/24/2022:  He feels that his hands are continuing to get weaker and often drops objects.  He denies any worsening numbness/tingling.  He also complains of chronic neck pain and stiffness.  He is not taking tizanidine, but does not recall why he stopped it.  Sometimes, he has shooting pain towards his head.  Balance is also poor.  No falls, walks unassisted.  Pain remains relatively controlled on gabapentin '1200mg'$  at bedtime.   HTN Bil Carpal tunnel releases 3 years ago had OT/PT and left elbow release HTN, A-fib, COPD PAIN:  PAIN:  Are you having pain? Yes NPRS scale: 1/10   Pain location: back of the neck shoots up toward head left side worse, also down both arms especially left  Aggravating factors:  reclined back;  after a lot of exertion (yardwork, playing golf) Relieving factors: lying down; sitting up straight   PRECAUTIONS: None "I can do what I feel comfortable doing."   WEIGHT BEARING RESTRICTIONS: No  FALLS:  Has patient fallen in last 6 months? No but balance is horrible (outside in yard or golf course)   OCCUPATION: retired ; enjoys golf can do 18 holes   PLOF: Independent  PATIENT GOALS: better hand strength, help with neck pain after exertion  OBJECTIVE:   DIAGNOSTIC FINDINGS:  CT on 09/28/2021 IMPRESSION: 1. No acute displaced fracture or traumatic listhesis of the cervical spine in a patient with cervicothoracic posterolateral surgical hardware that is only partially visualized and originates at the C5 level. Persistent hardware lucency surrounding the and screws which is best evaluated on the coronal views and most prominent along the right C5 level. 2. Multilevel degenerative changes of the cervical spine with associated severe osseous neural foraminal stenosis of the non-surgerized levels. 3.  Emphysema (ICD10-J43.9).     NCS/EMG and US  of the upper extremities 10/05/2021: There was electrodiagnostic and sonographic evidence of severe left median neuropathy at the wrist (carpal tunnel syndrome), left ulnar neuropathy with sonographic localization at the elbow as well as a left subchronic C6-8 cervical radiculopathy. There was no electrodiagnostic evidence of a diffuse large fiber polyneuropathy. There appears to also be sonographic evidence consistent with right median neuropathy at the wrist, as may be seen in carpal tunnel syndrome. This was not fully explored on this study given the extensive nature of prior testing and clinical question asked, but could be explored further on a future study if clinically indicated. Clinical correlation advised.    NCS/EMG of the arms 04/01/2019 This is a complex study of the upper extremities.  Findings are as follows:   Bilateral median neuropathy at or distal to the wrist (severe), consistent with a clinical diagnosis of carpal tunnel syndrome.   Bilateral ulnar neuropathy with slowing across the elbow, mild on the right and severe on the left. Chronic left posterior interosseous neuropathy, very severe.  Chronic C7-8 radiculopathy affecting bilateral upper extremities, moderate in degree electrically.   MRI cervical spine wo contrast 07/04/2018: 1. C5-T3 posterior-lateral fusion with decompressive laminectomy from C5-6 to T1-2. There is no arthrodesis by CT and the C5 and C6 lateral mass screws are loose. 2. Disc and facet degeneration causes multilevel foraminal impingement. Greatest foraminal impingement is seen on the left at C2-3, bilaterally at C3-4, right at C5-6, and right at T1-2. 3. Widely patent canal at levels of laminectomy. Noncompressive spinal stenosis seen at C3-4 and C4-5. 4. Septated fluid collection within the lower laminectomy defect without dural mass effect, best attributed to seroma.   MRI thoracic spine wo contrast 07/05/2018: Cervical and thoracic fusion and laminectomy. 15 mm fluid collection in the laminectomy bed. No cord compression or cord signal abnormality. Left paracentral disc protrusion T7-8 unchanged from the prior MRI PATIENT SURVEYS:  FOTO 54%  COGNITION: Overall cognitive status: Within functional limits for tasks assessed  SENSATION:  left 5th finger constantly numb, forearm 5-6 years bilaterally Noted atrophy left hypothenar eminence  POSTURE: rounded shoulders and forward head   Decreased cervical and flexor and extensor strength 4-/5 CERVICAL ROM:   Active ROM A/PROM (deg) eval 11/15  Flexion 34 36  Extension 22 40  Right lateral flexion 25 31  Left lateral flexion 20 43  Right rotation 40 55  Left rotation 20 50   (Blank rows = not tested)  UPPER EXTREMITY ROM:  grossly WFLS  UPPER EXTREMITY MMT:  MMT Right eval Left eval  Shoulder flexion  4+ 4-  Shoulder extension    Shoulder abduction 4+ 4-  Shoulder adduction    Shoulder extension    Shoulder internal rotation    Shoulder external rotation    Middle trapezius 4 4-  Lower trapezius 4 4-  Elbow flexion 5 4  Elbow extension 4 2+  Wrist flexion  2+  Wrist extension  2+  Wrist ulnar deviation    Wrist radial deviation    Wrist pronation    Wrist supination    Grip strength 33 24   (Blank rows = not tested)  Pinch strength right 15#, left 5#  CERVICAL SPECIAL TESTS:  Upper limb tension test (ULTT): Negative and Distraction test: Positive  (seated gentle) No symptoms produced or changed but neural tension bil   TODAY'S TREATMENT:   DATE: 09/08/22  UBE 6 min forward and backward Standing radial neural flossing 10x right/left Heel raises, toes raises,  SLS right/left  Cable row 15# 2x 10 Lat bar 40# 2x 10 5# snatch/press overhead 10x right/left  Manual therapy: gentle manual traction, suboccipital release, manual upper trap stretch, manual pectoral stretch Therapeutic activities:   lifting, pushing, pulling       DATE: 09/06/22  UBE 5 min forward and backward Standing neural flossing 10x right/left Cable row 15# 20x Lat bar 40# 20x Wrist extension on towel roll on mat table  Discussed local gym options  Manual therapy: soft tissue mobilization to bil cervical paraspinals, bil upper traps, bil suboccipitals, right levator scap Trigger Point Dry-Needling  Treatment instructions: Expect mild to moderate muscle soreness. S/S of pneumothorax if dry needled over a lung field, and to seek immediate medical attention should they occur. Patient verbalized understanding of these instructions and education.  Patient Consent Given: Yes Education handout provided: Yes Muscles treated: bil cervical multifidi, bil upper traps, right levator scap; bil suboccipitals Electrical stimulation performed: No Parameters: N/A Treatment response/outcome: improved soft tissue  length   TODAY'S TREATMENT:   DATE: 08/31/22  Cervical isometrics: 5 sec hold flexion, extension, sidebending, rotation 5 reps each Standing neural flossing 10x right/left Blue band rows 10x Blue band bil shoulder extension 10x Manual therapy: soft tissue mobilization to bil cervical paraspinals, bil upper traps, bil suboccipitals Trigger Point Dry-Needling  Treatment instructions: Expect mild to moderate muscle soreness. S/S of pneumothorax if dry needled over a lung field, and to seek immediate medical attention should they occur. Patient verbalized understanding of these instructions and education.  Patient Consent Given: Yes Education handout provided: Yes Muscles treated: bil cervical multifidi, bil upper traps, bil suboccipitals Electrical stimulation performed: No Parameters: N/A Treatment response/outcome: improved soft tissue length Moist heat 2 min while review of HEP                                                                                                                           PATIENT EDUCATION:  Education details: plan of care  Person educated: Patient Education method: Explanation Education comprehension: verbalized understanding  HOME EXERCISE PROGRAM: Access Code: [email or web address] URL: [email or web address] Date: 08/31/2022 Prepared by: Ruben Im  Exercises - Seated Upper Trapezius Stretch  - 1 x daily - 7 x weekly - 1 sets - 2-3 reps - 30 hold - Seated Cervical Flexion AROM  - 1 x daily - 7 x weekly - 1 sets - 2-3 reps - 30 hold - Seated Scapular Retraction  - 1 x daily - 7 x weekly - 1 sets - 10 reps - Supine Scapular Retraction  - 1 x daily - 7 x weekly - 1 sets - 10 reps - Seated Isometric Cervical Flexion  - 1 x daily - 7 x weekly - 1 sets - 5 reps - 5 hold - Seated Isometric Cervical Extension  - 1 x daily - 7 x weekly - 1 sets - 5 reps - 5 hold - Seated Isometric Cervical Sidebending  - 1 x daily -  7 x weekly - 1 sets - 5 reps - 5  hold - Seated Isometric Cervical Rotation  - 1 x daily - 7 x weekly - 1 sets - 5 reps - 5 hold - Median Nerve Flossing  - 1 x daily - 7 x weekly - 1 sets - 10 reps - Standing Row with Anchored Resistance  - 1 x daily - 7 x weekly - 3 sets - 10 reps - Shoulder Extension with Resistance Hands Down  - 1 x daily - 7 x weekly - 3 sets - 10 reps  ASSESSMENT:  CLINICAL IMPRESSION: The patient reports decreased pain intensity since start of care.  Patient able to progress weights/resistance with exercises today without difficulty or exacerbation of pain.  Good response to gentle manual joint mobility and fascial mobility techniques.  Therapist monitoring response to all interventions and modifying treatment accordingly.     OBJECTIVE IMPAIRMENTS: decreased activity tolerance, impaired perceived functional ability, impaired flexibility, impaired sensation, impaired UE functional use, postural dysfunction, and pain.   ACTIVITY LIMITATIONS: carrying, lifting, sitting, standing, and reach over head  PARTICIPATION LIMITATIONS: interpersonal relationship, community activity, and yard work  PERSONAL FACTORS: Past/current experiences, Time since onset of injury/illness/exacerbation, and 3+ comorbidities: multi regions affected, HTN, A-fib, COPD  are also affecting patient's functional outcome.   REHAB POTENTIAL: Good  CLINICAL DECISION MAKING: Evolving/moderate complexity  EVALUATION COMPLEXITY: Moderate   GOALS: Goals reviewed with patient? Yes  SHORT TERM GOALS: Target date: 09/15/2022   The patient will demonstrate knowledge of basic self care strategies and exercises to promote healing   Baseline:  Goal status: INITIAL  2.  The patient will report a 30% improvement in pain levels with functional activities   Baseline:  Goal status: INITIAL  3.  Cervical flexors and extensors improved to 4/5 for improved tolerance with reclined sitting Baseline:  Goal status: INITIAL  4.   Shoulder/scapular strength improved to 4/5 needed for lifting/carrying medium objects for yardwork and traveling Baseline:  Goal status: INITIAL    LONG TERM GOALS: Target date: 10/13/2022  The patient will be independent in a safe self progression of a home exercise program to promote further recovery of function   Baseline:  Goal status: INITIAL  2.  The patient will report a 60% improvement in pain levels with functional activities  Baseline:  Goal status: INITIAL  3.  Cervical flexors and extensors improved to 4+/5 for improved tolerance with reclined sitting, playing golf Baseline:  Goal status: INITIAL  4.  Shoulder/scapular strength improved to 4+/5 needed for lifting/carrying medium to heavier objects for yardwork and suitcases for traveling Baseline:  Goal status: INITIAL  5.  The patient will have improved FOTO score to   59%    indicating improved function with less pain  Baseline:  Goal status: INITIAL   PLAN:  PT FREQUENCY: 1-2x/week  PT DURATION: 8 weeks  PLANNED INTERVENTIONS: Therapeutic exercises, Therapeutic activity, Neuromuscular re-education, Balance training, Gait training, Patient/Family education, Self Care, Joint mobilization, Aquatic Therapy, Dry Needling, Spinal mobilization, Cryotherapy, Moist heat, Taping, Traction, Ultrasound, Manual therapy, and Re-evaluation  PLAN FOR NEXT SESSION:  check STGS; cervical muscle strengthening ,  UBE and lat bar, counter push ups or quadruped; proximal UE strengthening, assess response to DN cervical multifidi, upper traps left > right and suboccipitals; manual traction light   Ruben Im, PT 09/08/22 11:58 AM Phone: [phone] Fax: [phone]

## 2022-09-13 ENCOUNTER — Ambulatory Visit: Payer: Medicare Other | Admitting: Physical Therapy

## 2022-09-13 DIAGNOSIS — M5412 Radiculopathy, cervical region: Secondary | ICD-10-CM | POA: Diagnosis not present

## 2022-09-13 DIAGNOSIS — M6281 Muscle weakness (generalized): Secondary | ICD-10-CM | POA: Diagnosis not present

## 2022-09-13 NOTE — Therapy (Signed)
OUTPATIENT PHYSICAL THERAPY CERVICAL PROGRESS NOTE   Patient Name: Richard Lynch MRN: [id number] DOB:[birth year], 71 y.o., male Today's Date: 08/18/2022  PT End of Session - 09/13/22 1019     Visit Number 6    Date for PT Re-Evaluation 10/13/22    Authorization Type Medicare    PT Start Time 1017    PT Stop Time 1100    PT Time Calculation (min) 43 min    Activity Tolerance Patient tolerated treatment well               Past Medical History:  Diagnosis Date   Atrial fibrillation ([place])    Constipation due to opioid therapy 12/22/2015   Emphysema lung ([place])    Gait abnormality 02/20/2017   GERD (gastroesophageal reflux disease)    High cholesterol    Hypertension    OSA (obstructive sleep apnea)    on CPAP   Primary localized osteoarthritis of right knee 12/08/2015   Septic olecranon bursitis 06/2015   MSSA   Past Surgical History:  Procedure Laterality Date   COLONOSCOPY W/ BIOPSIES AND POLYPECTOMY     ELBOW SURGERY  08/2015   bilateral   EYE SURGERY     'repair of tear to left eye from pliers'   HAND SURGERY Left 08/2018   Carpal Tunnel Surgery- [place]  07/2010   KNEE SURGERY     3 times on both knee's each   LEFT HEART CATH AND CORONARY ANGIOGRAPHY N/A 04/11/2022   Procedure: LEFT HEART CATH AND CORONARY ANGIOGRAPHY;  Surgeon: [name], MD;  Location: [facility];  Service: Cardiovascular;  Laterality: N/A;   MULTIPLE TOOTH EXTRACTIONS     SHOULDER ARTHROSCOPY     TOTAL HIP ARTHROPLASTY  08/2010   TOTAL KNEE ARTHROPLASTY Right 12/20/2015   TOTAL KNEE ARTHROPLASTY Right 12/20/2015   Procedure: TOTAL KNEE ARTHROPLASTY;  Surgeon: [name], MD;  Location: [facility];  Service: Orthopedics;  Laterality: Right;   Patient Active Problem List   Diagnosis Date Noted   Cigarette nicotine dependence without complication 30/16/0109   Coronary artery disease    Paroxysmal atrial fibrillation ([place]) 12/24/2019   Secondary hypercoagulable  state ([place]) 12/24/2019   Dupuytren's contracture 07/02/2018   Spinal stenosis in cervical region 12/17/2017   Carpal tunnel syndrome of left wrist 11/27/2017   Radial nerve palsy, left 11/27/2017   Trigger ring finger of left hand 11/27/2017   Gait abnormality 02/20/2017   Restless leg syndrome 07/05/2016   Constipation due to opioid therapy 12/22/2015   Primary localized osteoarthritis of right knee 12/08/2015   Bullous emphysema ([place]) 11/02/2015   Primary hypertension 11/02/2015   Arthritis 11/02/2015   Hyperlipidemia 11/02/2015   OSA on CPAP 11/02/2015   GERD (gastroesophageal reflux disease) 11/02/2015   Septic olecranon bursitis 06/24/2015   Blepharochalasis 12/29/2014   Hidrocystoma of left eyelid 12/29/2014   Myogenic ptosis of eyelid of both eyes 12/29/2014   Multiple lung nodules on CT 06/30/2014   Combined senile cataract 12/24/2012   HSV epithelial keratitis 12/24/2012   Myopia with astigmatism and presbyopia 12/24/2012   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  Overall 20% better.     PERTINENT HISTORY:  Both feet numb c/o dec balance Injured neck 25 years ago;  Cervical fusion C5-T3 in 2019.  Currently ruptured disc affecting C8 but surgery too dangerous now, could make the problem worse. Sharp shooting pain in neck and bil UEs especially left UE.  Left hand dominant.  Both hands a lot weaker, drop things often.  I get tired of shooting pains in arms and legs.  Can hold a pen 20 sec only.   Fatigue with hammering. Difficulty managing a 50# suitcase when traveling recently.  Patient reports my head feels heavy and hard to hold up.   2018-19:  C5-T3 posterior-lateral fusion with decompressive laminectomy from C5-6 to T1-2.  Per Dr. Patel's office note: Chronic right hand weakness and paresthesias due to overlapping C8 radiculopathy and entrapment neuropathies. Exam is stable. 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 He has been off IVIG since December 2021.   He has sought second opinion at [facility] and [facility], who agree that his hand paresthesias and weakness is mostly secondary to C8 radiculopathy with overlapping entrapment neuropathy.  He saw Neurosurgery at [facility] who advised that he is high risk for cervical surgery and recommended conservative therapies.  Management is supportive For pain, continue, continue gabapentin 1200mg at bedtime.  For neck pain, restart tizanidine 2mg BID as needed Start neck PT UPDATE 07/24/2022:  He feels that his hands are continuing to get weaker and often drops objects.  He denies any worsening numbness/tingling.  He also complains of chronic neck pain and stiffness.  He is not taking tizanidine, but does not recall why he stopped it.  Sometimes, he has shooting pain towards his head.  Balance is also poor.  No falls, walks unassisted.  Pain remains relatively controlled on gabapentin 1200mg at bedtime.   HTN Bil Carpal tunnel releases 3 years ago had OT/PT and left elbow release HTN, A-fib, COPD PAIN:  PAIN:  Are you having pain? Yes NPRS scale: 1/10   Pain location: back of the neck shoots up toward head left side worse, also down both arms especially left  Aggravating factors: reclined back;  after a lot of exertion (yardwork, playing golf) Relieving factors: lying down; sitting up straight    PRECAUTIONS: None "I can do what I feel comfortable doing."   WEIGHT BEARING RESTRICTIONS: No  FALLS:  Has patient fallen in last 6 months? No but balance is horrible (outside in yard or golf course)   OCCUPATION: retired ; enjoys golf can do 18 holes   PLOF: Independent  PATIENT GOALS: better hand strength, help with neck pain after exertion  OBJECTIVE:   DIAGNOSTIC FINDINGS:  CT on 09/28/2021 IMPRESSION: 1. No acute displaced fracture or traumatic listhesis of the cervical spine in a patient with cervicothoracic posterolateral surgical hardware that is only partially visualized and originates at the C5 level. Persistent hardware lucency surrounding the and screws which is best evaluated on the coronal views and most prominent along the right C5 level. 2. Multilevel degenerative changes of the cervical spine with associated severe osseous neural foraminal stenosis of the non-surgerized levels. 3.  Emphysema (ICD10-J43.9).     NCS/EMG and US of the upper extremities 10/05/2021: There was electrodiagnostic and sonographic evidence of severe left median neuropathy at the wrist (carpal tunnel syndrome), left ulnar neuropathy   with sonographic localization at the elbow as well as a left subchronic C6-8 cervical radiculopathy. There was no electrodiagnostic evidence of a diffuse large fiber polyneuropathy. There appears to also be sonographic evidence consistent with right median neuropathy at the wrist, as may be seen in carpal tunnel syndrome. This was not fully explored on this study given the extensive nature of prior testing and clinical question asked, but could be explored further on a future study if clinically indicated. Clinical correlation advised.    NCS/EMG of the arms 04/01/2019 This is a complex study of the upper extremities.  Findings are as follows:  Bilateral median neuropathy at or distal to the wrist (severe), consistent with a clinical diagnosis of carpal tunnel  syndrome.   Bilateral ulnar neuropathy with slowing across the elbow, mild on the right and severe on the left. Chronic left posterior interosseous neuropathy, very severe.  Chronic C7-8 radiculopathy affecting bilateral upper extremities, moderate in degree electrically.   MRI cervical spine wo contrast 07/04/2018: 1. C5-T3 posterior-lateral fusion with decompressive laminectomy from C5-6 to T1-2. There is no arthrodesis by CT and the C5 and C6 lateral mass screws are loose. 2. Disc and facet degeneration causes multilevel foraminal impingement. Greatest foraminal impingement is seen on the left at C2-3, bilaterally at C3-4, right at C5-6, and right at T1-2. 3. Widely patent canal at levels of laminectomy. Noncompressive spinal stenosis seen at C3-4 and C4-5. 4. Septated fluid collection within the lower laminectomy defect without dural mass effect, best attributed to seroma.   MRI thoracic spine wo contrast 07/05/2018: Cervical and thoracic fusion and laminectomy. 15 mm fluid collection in the laminectomy bed. No cord compression or cord signal abnormality. Left paracentral disc protrusion T7-8 unchanged from the prior MRI PATIENT SURVEYS:  FOTO 54%  COGNITION: Overall cognitive status: Within functional limits for tasks assessed  SENSATION:  left 5th finger constantly numb, forearm 5-6 years bilaterally Noted atrophy left hypothenar eminence  POSTURE: rounded shoulders and forward head   Decreased cervical and flexor and extensor strength 4-/5 CERVICAL ROM:   Active ROM A/PROM (deg) eval 11/15  Flexion 34 36  Extension 22 40  Right lateral flexion 25 31  Left lateral flexion 20 43  Right rotation 40 55  Left rotation 20 50   (Blank rows = not tested)  UPPER EXTREMITY ROM:  grossly WFLS  UPPER EXTREMITY MMT:  MMT Right eval Left eval 11/22  Shoulder flexion 4+ 4- Left 4  Shoulder extension     Shoulder abduction 4+ 4- Left 4  Shoulder adduction     Shoulder  extension     Shoulder internal rotation     Shoulder external rotation     Middle trapezius 4 4-   Lower trapezius 4 4-   Elbow flexion 5 4   Elbow extension 4 2+   Wrist flexion  2+   Wrist extension  2+   Wrist ulnar deviation     Wrist radial deviation     Wrist pronation     Wrist supination     Grip strength 33 24    (Blank rows = not tested)  Pinch strength right 15#, left 5#  CERVICAL SPECIAL TESTS:  Upper limb tension test (ULTT): Negative and Distraction test: Positive  (seated gentle) No symptoms produced or changed but neural tension bil   TODAY'S TREATMENT:   DATE: 09/13/22  UBE 7 min forward and backward Standing neural tensioning 10x right/left Triceps 20# 2x 10 2# shoulder 3 ways 10x  each Matrix row 30# 15x Lat bar 40# 20x 10# snatch/press overhead 10x right/left  Therapeutic activities:   lifting, pushing, pulling  Manual therapy: soft tissue mobilization to bil cervical paraspinals, bil upper traps, bil suboccipitals, right levator scap Trigger Point Dry-Needling  Treatment instructions: Expect mild to moderate muscle soreness. S/S of pneumothorax if dry needled over a lung field, and to seek immediate medical attention should they occur. Patient verbalized understanding of these instructions and education.  Patient Consent Given: Yes Education handout provided: Yes Muscles treated: bil cervical multifidi, bil upper traps, right levator scap; bil suboccipitals Electrical stimulation performed: No Parameters: N/A Treatment response/outcome: improved soft tissue length       DATE: 09/08/22  UBE 6 min forward and backward Standing radial neural flossing 10x right/left Heel raises, toes raises, SLS right/left  Cable row 15# 2x 10 Lat bar 40# 2x 10 5# snatch/press overhead 10x right/left  Manual therapy: gentle manual traction, suboccipital release, manual upper trap stretch, manual pectoral stretch Therapeutic activities:   lifting, pushing,  pulling       DATE: 09/06/22  UBE 5 min forward and backward Standing neural flossing 10x right/left Cable row 15# 20x Lat bar 40# 20x Wrist extension on towel roll on mat table  Discussed local gym options  Manual therapy: soft tissue mobilization to bil cervical paraspinals, bil upper traps, bil suboccipitals, right levator scap Trigger Point Dry-Needling  Treatment instructions: Expect mild to moderate muscle soreness. S/S of pneumothorax if dry needled over a lung field, and to seek immediate medical attention should they occur. Patient verbalized understanding of these instructions and education.  Patient Consent Given: Yes Education handout provided: Yes Muscles treated: bil cervical multifidi, bil upper traps, right levator scap; bil suboccipitals Electrical stimulation performed: No Parameters: N/A Treatment response/outcome: improved soft tissue length                                                                                                            PATIENT EDUCATION:  Education details: plan of care  Person educated: Patient Education method: Explanation Education comprehension: verbalized understanding  HOME EXERCISE PROGRAM: Access Code: [email or web address] URL: [email or web address] Date: 08/31/2022 Prepared by: Stacy Simpson  Exercises - Seated Upper Trapezius Stretch  - 1 x daily - 7 x weekly - 1 sets - 2-3 reps - 30 hold - Seated Cervical Flexion AROM  - 1 x daily - 7 x weekly - 1 sets - 2-3 reps - 30 hold - Seated Scapular Retraction  - 1 x daily - 7 x weekly - 1 sets - 10 reps - Supine Scapular Retraction  - 1 x daily - 7 x weekly - 1 sets - 10 reps - Seated Isometric Cervical Flexion  - 1 x daily - 7 x weekly - 1 sets - 5 reps - 5 hold - Seated Isometric Cervical Extension  - 1 x daily - 7 x weekly - 1 sets - 5 reps - 5 hold - Seated Isometric Cervical Sidebending  - 1 x daily - 7 x weekly - 1   sets - 5 reps - 5 hold - Seated Isometric Cervical  Rotation  - 1 x daily - 7 x weekly - 1 sets - 5 reps - 5 hold - Median Nerve Flossing  - 1 x daily - 7 x weekly - 1 sets - 10 reps - Standing Row with Anchored Resistance  - 1 x daily - 7 x weekly - 3 sets - 10 reps - Shoulder Extension with Resistance Hands Down  - 1 x daily - 7 x weekly - 3 sets - 10 reps  ASSESSMENT:  CLINICAL IMPRESSION: The patient reports he is 20% better overall.  Other STGS met.  The patient responds well to exercise with focus of cervical and upper quarter strengthening.  No exacerbation of symptoms. Therapist providing cues for increased neural mobility and technique to optimize benefit.   He continues to respond well to manual therapy and DN with significantly improved soft tissue mobility since start of care.     OBJECTIVE IMPAIRMENTS: decreased activity tolerance, impaired perceived functional ability, impaired flexibility, impaired sensation, impaired UE functional use, postural dysfunction, and pain.   ACTIVITY LIMITATIONS: carrying, lifting, sitting, standing, and reach over head  PARTICIPATION LIMITATIONS: interpersonal relationship, community activity, and yard work  PERSONAL FACTORS: Past/current experiences, Time since onset of injury/illness/exacerbation, and 3+ comorbidities: multi regions affected, HTN, A-fib, COPD  are also affecting patient's functional outcome.   REHAB POTENTIAL: Good  CLINICAL DECISION MAKING: Evolving/moderate complexity  EVALUATION COMPLEXITY: Moderate   GOALS: Goals reviewed with patient? Yes  SHORT TERM GOALS: Target date: 09/15/2022   The patient will demonstrate knowledge of basic self care strategies and exercises to promote healing   Baseline:  Goal status: goal met 11/22 2.  The patient will report a 30% improvement in pain levels with functional activities   Baseline:  Goal status: ongoing  3.  Cervical flexors and extensors improved to 4/5 for improved tolerance with reclined sitting Baseline:  Goal  status: goal met 11/22  4.  Shoulder/scapular strength improved to 4/5 needed for lifting/carrying medium objects for yardwork and traveling Baseline:  Goal status: goal met 11/22    LONG TERM GOALS: Target date: 10/13/2022  The patient will be independent in a safe self progression of a home exercise program to promote further recovery of function   Baseline:  Goal status: INITIAL  2.  The patient will report a 60% improvement in pain levels with functional activities  Baseline:  Goal status: INITIAL  3.  Cervical flexors and extensors improved to 4+/5 for improved tolerance with reclined sitting, playing golf Baseline:  Goal status: INITIAL  4.  Shoulder/scapular strength improved to 4+/5 needed for lifting/carrying medium to heavier objects for yardwork and suitcases for traveling Baseline:  Goal status: INITIAL  5.  The patient will have improved FOTO score to   59%    indicating improved function with less pain  Baseline:  Goal status: INITIAL   PLAN:  PT FREQUENCY: 1-2x/week  PT DURATION: 8 weeks  PLANNED INTERVENTIONS: Therapeutic exercises, Therapeutic activity, Neuromuscular re-education, Balance training, Gait training, Patient/Family education, Self Care, Joint mobilization, Aquatic Therapy, Dry Needling, Spinal mobilization, Cryotherapy, Moist heat, Taping, Traction, Ultrasound, Manual therapy, and Re-evaluation  PLAN FOR NEXT SESSION:   cervical muscle strengthening ,  UBE and lat bar, counter push ups or quadruped; proximal UE strengthening, assess response to DN cervical multifidi, upper traps left > right and suboccipitals; manual traction light  Ruben Im, PT 09/13/22 11:00 AM Phone: [phone] Fax: [phone]

## 2022-09-20 ENCOUNTER — Ambulatory Visit: Payer: Medicare Other | Admitting: Physical Therapy

## 2022-09-20 DIAGNOSIS — M5412 Radiculopathy, cervical region: Secondary | ICD-10-CM | POA: Diagnosis not present

## 2022-09-20 DIAGNOSIS — M6281 Muscle weakness (generalized): Secondary | ICD-10-CM

## 2022-09-20 NOTE — Therapy (Signed)
OUTPATIENT PHYSICAL THERAPY CERVICAL PROGRESS NOTE   Patient Name: Richard Lynch MRN: [id number] DOB:10-09-1951, 71 y.o., male Today's Date: 08/18/2022  PT End of Session - 09/20/22 0932     Visit Number 7    Date for PT Re-Evaluation 10/13/22    Authorization Type Medicare    PT Start Time 0930    PT Stop Time 1010    PT Time Calculation (min) 40 min    Activity Tolerance Patient tolerated treatment well               Past Medical History:  Diagnosis Date   Atrial fibrillation ([place])    Constipation due to opioid therapy 12/22/2015   Emphysema lung ([place])    Gait abnormality 02/20/2017   GERD (gastroesophageal reflux disease)    High cholesterol    Hypertension    OSA (obstructive sleep apnea)    on CPAP   Primary localized osteoarthritis of right knee 12/08/2015   Septic olecranon bursitis 06/2015   MSSA   Past Surgical History:  Procedure Laterality Date   COLONOSCOPY W/ BIOPSIES AND POLYPECTOMY     ELBOW SURGERY  08/2015   bilateral   EYE SURGERY     'repair of tear to left eye from pliers'   HAND SURGERY Left 08/2018   Carpal Tunnel Surgery- [place]  07/2010   KNEE SURGERY     3 times on both knee's each   LEFT HEART CATH AND CORONARY ANGIOGRAPHY N/A 04/11/2022   Procedure: LEFT HEART CATH AND CORONARY ANGIOGRAPHY;  Surgeon: [name], MD;  Location: [facility];  Service: Cardiovascular;  Laterality: N/A;   MULTIPLE TOOTH EXTRACTIONS     SHOULDER ARTHROSCOPY     TOTAL HIP ARTHROPLASTY  08/2010   TOTAL KNEE ARTHROPLASTY Right 12/20/2015   TOTAL KNEE ARTHROPLASTY Right 12/20/2015   Procedure: TOTAL KNEE ARTHROPLASTY;  Surgeon: [name], MD;  Location: [facility];  Service: Orthopedics;  Laterality: Right;   Patient Active Problem List   Diagnosis Date Noted   Cigarette nicotine dependence without complication 81/27/5170   Coronary artery disease    Paroxysmal atrial fibrillation ([place]) 12/24/2019   Secondary hypercoagulable  state ([place]) 12/24/2019   Dupuytren's contracture 07/02/2018   Spinal stenosis in cervical region 12/17/2017   Carpal tunnel syndrome of left wrist 11/27/2017   Radial nerve palsy, left 11/27/2017   Trigger ring finger of left hand 11/27/2017   Gait abnormality 02/20/2017   Restless leg syndrome 07/05/2016   Constipation due to opioid therapy 12/22/2015   Primary localized osteoarthritis of right knee 12/08/2015   Bullous emphysema ([place]) 11/02/2015   Primary hypertension 11/02/2015   Arthritis 11/02/2015   Hyperlipidemia 11/02/2015   OSA on CPAP 11/02/2015   GERD (gastroesophageal reflux disease) 11/02/2015   Septic olecranon bursitis 06/24/2015   Blepharochalasis 12/29/2014   Hidrocystoma of left eyelid 12/29/2014   Myogenic ptosis of eyelid of both eyes 12/29/2014   Multiple lung nodules on CT 06/30/2014   Combined senile cataract 12/24/2012   HSV epithelial keratitis 12/24/2012   Myopia with astigmatism and presbyopia 12/24/2012   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   Shooting pains down arms at night continue but neck is feeling better.    PERTINENT HISTORY:  Both feet numb c/o dec balance Injured neck 25 years ago;  Cervical fusion C5-T3 in 2019.  Currently ruptured disc affecting C8 but surgery too dangerous now, could make the problem worse. Sharp shooting pain in neck and bil UEs especially left UE.  Left hand dominant.  Both hands a lot weaker, drop things  often.  I get tired of shooting pains in arms and legs.  Can hold a pen 20 sec only.  Fatigue with hammering. Difficulty managing a 50# suitcase when traveling recently.  Patient reports my head feels heavy and hard to hold up.   2018-19:  C5-T3 posterior-lateral fusion with decompressive laminectomy from C5-6 to T1-2.  Per Dr. [name] office note: Chronic right hand weakness and paresthesias due to overlapping C8 radiculopathy and entrapment neuropathies. Exam is stable. 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 He has been off IVIG since December 2021.   He has sought second opinion at [facility], who agree that his hand paresthesias and weakness is mostly secondary to C8 radiculopathy with overlapping entrapment neuropathy.  He saw Neurosurgery at [facility] who advised that he is high risk for cervical surgery and recommended conservative therapies.  Management is supportive For pain, continue, continue gabapentin 1280m at bedtime.  For neck pain, restart tizanidine 249mBID as needed Start neck PT UPDATE 07/24/2022:  He feels that his hands are continuing to get weaker and often drops objects.  He denies any worsening numbness/tingling.  He also complains of chronic neck pain and stiffness.  He is not taking tizanidine, but does not recall why he stopped it.  Sometimes, he has shooting pain towards his head.  Balance is also poor.  No falls, walks unassisted.  Pain remains relatively controlled on gabapentin 120076mt bedtime.   HTN Bil Carpal tunnel releases 3 years ago had OT/PT and left elbow release HTN, A-fib, COPD PAIN:  PAIN:  Are you having pain? Yes NPRS scale: 1/10   Pain location: back of the neck shoots up toward head left side worse, also down both arms especially left  Aggravating factors: reclined back;  after a lot of exertion  (yardwork, playing golf) Relieving factors: lying down; sitting up straight   PRECAUTIONS: None "I can do what I feel comfortable doing."   WEIGHT BEARING RESTRICTIONS: No  FALLS:  Has patient fallen in last 6 months? No but balance is horrible (outside in yard or golf course)   OCCUPATION: retired ; enjoys golf can do 18 holes   PLOF: Independent  PATIENT GOALS: better hand strength, help with neck pain after exertion  OBJECTIVE:   DIAGNOSTIC FINDINGS:  CT on 09/28/2021 IMPRESSION: 1. No acute displaced fracture or traumatic listhesis of the cervical spine in a patient with cervicothoracic posterolateral surgical hardware that is only partially visualized and originates at the C5 level. Persistent hardware lucency surrounding the and screws which is best evaluated on the coronal views and most prominent along the right C5 level. 2. Multilevel degenerative changes of the cervical spine with associated severe osseous neural foraminal stenosis of the non-surgerized levels. 3.  Emphysema (ICD10-J43.9).     NCS/EMG and [place] the upper extremities 10/05/2021: There was electrodiagnostic and sonographic  evidence of severe left median neuropathy at the wrist (carpal tunnel syndrome), left ulnar neuropathy with sonographic localization at the elbow as well as a left subchronic C6-8 cervical radiculopathy. There was no electrodiagnostic evidence of a diffuse large fiber polyneuropathy. There appears to also be sonographic evidence consistent with right median neuropathy at the wrist, as may be seen in carpal tunnel syndrome. This was not fully explored on this study given the extensive nature of prior testing and clinical question asked, but could be explored further on a future study if clinically indicated. Clinical correlation advised.    NCS/EMG of the arms 04/01/2019 This is a complex study of the upper extremities.  Findings are as follows:  Bilateral median neuropathy at or distal  to the wrist (severe), consistent with a clinical diagnosis of carpal tunnel syndrome.   Bilateral ulnar neuropathy with slowing across the elbow, mild on the right and severe on the left. Chronic left posterior interosseous neuropathy, very severe.  Chronic C7-8 radiculopathy affecting bilateral upper extremities, moderate in degree electrically.   MRI cervical spine wo contrast 07/04/2018: 1. C5-T3 posterior-lateral fusion with decompressive laminectomy from C5-6 to T1-2. There is no arthrodesis by CT and the C5 and C6 lateral mass screws are loose. 2. Disc and facet degeneration causes multilevel foraminal impingement. Greatest foraminal impingement is seen on the left at C2-3, bilaterally at C3-4, right at C5-6, and right at T1-2. 3. Widely patent canal at levels of laminectomy. Noncompressive spinal stenosis seen at C3-4 and C4-5. 4. Septated fluid collection within the lower laminectomy defect without dural mass effect, best attributed to seroma.   MRI thoracic spine wo contrast 07/05/2018: Cervical and thoracic fusion and laminectomy. 15 mm fluid collection in the laminectomy bed. No cord compression or cord signal abnormality. Left paracentral disc protrusion T7-8 unchanged from the prior MRI PATIENT SURVEYS:  FOTO 54%  COGNITION: Overall cognitive status: Within functional limits for tasks assessed  SENSATION:  left 5th finger constantly numb, forearm 5-6 years bilaterally Noted atrophy left hypothenar eminence  POSTURE: rounded shoulders and forward head   Decreased cervical and flexor and extensor strength 4-/5 CERVICAL ROM:   Active ROM A/PROM (deg) eval 11/15  Flexion 34 36  Extension 22 40  Right lateral flexion 25 31  Left lateral flexion 20 43  Right rotation 40 55  Left rotation 20 50   (Blank rows = not tested)  UPPER EXTREMITY ROM:  grossly WFLS  UPPER EXTREMITY MMT:  MMT Right eval Left eval 11/22  Shoulder flexion 4+ 4- Left 4  Shoulder extension      Shoulder abduction 4+ 4- Left 4  Shoulder adduction     Shoulder extension     Shoulder internal rotation     Shoulder external rotation     Middle trapezius 4 4-   Lower trapezius 4 4-   Elbow flexion 5 4   Elbow extension 4 2+   Wrist flexion  2+   Wrist extension  2+   Wrist ulnar deviation     Wrist radial deviation     Wrist pronation     Wrist supination     Grip strength 33 24    (Blank rows = not tested)  Pinch strength right 15#, left 5#  CERVICAL SPECIAL TESTS:  Upper limb tension test (ULTT): Negative and Distraction test: Positive  (seated gentle) No symptoms produced or changed but neural tension bil   TODAY'S TREATMENT:   DATE: 09/20/22  UBE 7 min forward and  backward 3# shoulder 3 ways 10x each Matrix row 30# 15x Lat bar 40# 20x 10# snatch/press overhead 10x right/left  Therapeutic activities:   lifting, pushing, pulling  Manual therapy: soft tissue mobilization to bil cervical paraspinals, bil upper traps, bil suboccipitals, right levator scap; gentle cervical distraction  Trigger Point Dry-Needling  Treatment instructions: Expect mild to moderate muscle soreness. S/S of pneumothorax if dry needled over a lung field, and to seek immediate medical attention should they occur. Patient verbalized understanding of these instructions and education.  Patient Consent Given: Yes Education handout provided: Yes Muscles treated: bil cervical multifidi, bil upper traps, right levator scap; bil suboccipitals Electrical stimulation performed: No Parameters: N/A Treatment response/outcome: improved soft tissue length            DATE: 09/13/22  UBE 7 min forward and backward Standing neural tensioning 10x right/left Triceps 20# 2x 10 2# shoulder 3 ways 10x each Matrix row 30# 15x Lat bar 40# 20x 10# snatch/press overhead 10x right/left  Therapeutic activities:   lifting, pushing, pulling  Manual therapy: soft tissue mobilization to bil cervical  paraspinals, bil upper traps, bil suboccipitals, right levator scap Trigger Point Dry-Needling  Treatment instructions: Expect mild to moderate muscle soreness. S/S of pneumothorax if dry needled over a lung field, and to seek immediate medical attention should they occur. Patient verbalized understanding of these instructions and education.  Patient Consent Given: Yes Education handout provided: Yes Muscles treated: bil cervical multifidi, bil upper traps, right levator scap; bil suboccipitals Electrical stimulation performed: No Parameters: N/A Treatment response/outcome: improved soft tissue length                              PATIENT EDUCATION:  Education details: plan of care  Person educated: Patient Education method: Explanation Education comprehension: verbalized understanding  HOME EXERCISE PROGRAM: Access Code: [email or web address] URL: [email or web address] Date: 08/31/2022 Prepared by: Ruben Im  Exercises - Seated Upper Trapezius Stretch  - 1 x daily - 7 x weekly - 1 sets - 2-3 reps - 30 hold - Seated Cervical Flexion AROM  - 1 x daily - 7 x weekly - 1 sets - 2-3 reps - 30 hold - Seated Scapular Retraction  - 1 x daily - 7 x weekly - 1 sets - 10 reps - Supine Scapular Retraction  - 1 x daily - 7 x weekly - 1 sets - 10 reps - Seated Isometric Cervical Flexion  - 1 x daily - 7 x weekly - 1 sets - 5 reps - 5 hold - Seated Isometric Cervical Extension  - 1 x daily - 7 x weekly - 1 sets - 5 reps - 5 hold - Seated Isometric Cervical Sidebending  - 1 x daily - 7 x weekly - 1 sets - 5 reps - 5 hold - Seated Isometric Cervical Rotation  - 1 x daily - 7 x weekly - 1 sets - 5 reps - 5 hold - Median Nerve Flossing  - 1 x daily - 7 x weekly - 1 sets - 10 reps - Standing Row with Anchored Resistance  - 1 x daily - 7 x weekly - 3 sets - 10 reps - Shoulder Extension with Resistance Hands Down  - 1 x daily - 7 x weekly - 3 sets - 10 reps  ASSESSMENT:  CLINICAL  IMPRESSION: Much improved soft tissue mobility in cervical musculature since start of care.  He has responded well to DN and  manual therapy complimented by therapeutic exercise.  Therapist monitoring technique and providing verbal cues to optimize technique.  Low symptom irritability with exercise.     OBJECTIVE IMPAIRMENTS: decreased activity tolerance, impaired perceived functional ability, impaired flexibility, impaired sensation, impaired UE functional use, postural dysfunction, and pain.   ACTIVITY LIMITATIONS: carrying, lifting, sitting, standing, and reach over head  PARTICIPATION LIMITATIONS: interpersonal relationship, community activity, and yard work  PERSONAL FACTORS: Past/current experiences, Time since onset of injury/illness/exacerbation, and 3+ comorbidities: multi regions affected, HTN, A-fib, COPD  are also affecting patient's functional outcome.   REHAB POTENTIAL: Good  CLINICAL DECISION MAKING: Evolving/moderate complexity  EVALUATION COMPLEXITY: Moderate   GOALS: Goals reviewed with patient? Yes  SHORT TERM GOALS: Target date: 09/15/2022   The patient will demonstrate knowledge of basic self care strategies and exercises to promote healing   Baseline:  Goal status: goal met 11/22 2.  The patient will report a 30% improvement in pain levels with functional activities   Baseline:  Goal status: ongoing  3.  Cervical flexors and extensors improved to 4/5 for improved tolerance with reclined sitting Baseline:  Goal status: goal met 11/22  4.  Shoulder/scapular strength improved to 4/5 needed for lifting/carrying medium objects for yardwork and traveling Baseline:  Goal status: goal met 11/22    LONG TERM GOALS: Target date: 10/13/2022  The patient will be independent in a safe self progression of a home exercise program to promote further recovery of function   Baseline:  Goal status: INITIAL  2.  The patient will report a 60% improvement in pain levels  with functional activities  Baseline:  Goal status: INITIAL  3.  Cervical flexors and extensors improved to 4+/5 for improved tolerance with reclined sitting, playing golf Baseline:  Goal status: INITIAL  4.  Shoulder/scapular strength improved to 4+/5 needed for lifting/carrying medium to heavier objects for yardwork and suitcases for traveling Baseline:  Goal status: INITIAL  5.  The patient will have improved FOTO score to   59%    indicating improved function with less pain  Baseline:  Goal status: INITIAL   PLAN:  PT FREQUENCY: 1-2x/week  PT DURATION: 8 weeks  PLANNED INTERVENTIONS: Therapeutic exercises, Therapeutic activity, Neuromuscular re-education, Balance training, Gait training, Patient/Family education, Self Care, Joint mobilization, Aquatic Therapy, Dry Needling, Spinal mobilization, Cryotherapy, Moist heat, Taping, Traction, Ultrasound, Manual therapy, and Re-evaluation  PLAN FOR NEXT SESSION:   cervical muscle strengthening ,  UBE and lat bar, counter push ups or quadruped; proximal UE strengthening, assess response to DN cervical multifidi, upper traps left > right and suboccipitals; manual traction light   Ruben Im, PT 09/20/22 1:15 PM Phone: [phone] Fax: [phone]

## 2022-09-22 ENCOUNTER — Ambulatory Visit: Payer: Medicare Other | Admitting: Physical Therapy

## 2022-09-26 ENCOUNTER — Other Ambulatory Visit: Payer: Self-pay

## 2022-09-26 MED ORDER — CHLORTHALIDONE 25 MG PO TABS: 25.0000 mg | ORAL_TABLET | Freq: Every day | ORAL | 3 refills | Status: DC

## 2022-09-27 ENCOUNTER — Ambulatory Visit: Payer: Medicare Other | Attending: Neurology | Admitting: Physical Therapy

## 2022-09-27 DIAGNOSIS — M6281 Muscle weakness (generalized): Secondary | ICD-10-CM | POA: Diagnosis not present

## 2022-09-27 DIAGNOSIS — M5412 Radiculopathy, cervical region: Secondary | ICD-10-CM | POA: Insufficient documentation

## 2022-09-27 NOTE — Therapy (Signed)
OUTPATIENT PHYSICAL THERAPY CERVICAL PROGRESS NOTE   Patient Name: Richard Lynch MRN: [id number] DOB:1951/07/26, 71 y.o., male Today's Date: 08/18/2022  PT End of Session - 09/27/22 1015     Visit Number 8    Date for PT Re-Evaluation 10/13/22    Authorization Type Medicare    PT Start Time 1015    PT Stop Time 1055    PT Time Calculation (min) 40 min    Activity Tolerance Patient tolerated treatment well               Past Medical History:  Diagnosis Date   Atrial fibrillation ([place])    Constipation due to opioid therapy 12/22/2015   Emphysema lung ([place])    Gait abnormality 02/20/2017   GERD (gastroesophageal reflux disease)    High cholesterol    Hypertension    OSA (obstructive sleep apnea)    on CPAP   Primary localized osteoarthritis of right knee 12/08/2015   Septic olecranon bursitis 06/2015   MSSA   Past Surgical History:  Procedure Laterality Date   COLONOSCOPY W/ BIOPSIES AND POLYPECTOMY     ELBOW SURGERY  08/2015   bilateral   EYE SURGERY     'repair of tear to left eye from pliers'   HAND SURGERY Left 08/2018   Carpal Tunnel Surgery- [place]  07/2010   KNEE SURGERY     3 times on both knee's each   LEFT HEART CATH AND CORONARY ANGIOGRAPHY N/A 04/11/2022   Procedure: LEFT HEART CATH AND CORONARY ANGIOGRAPHY;  Surgeon: [name], MD;  Location: [facility];  Service: Cardiovascular;  Laterality: N/A;   MULTIPLE TOOTH EXTRACTIONS     SHOULDER ARTHROSCOPY     TOTAL HIP ARTHROPLASTY  08/2010   TOTAL KNEE ARTHROPLASTY Right 12/20/2015   TOTAL KNEE ARTHROPLASTY Right 12/20/2015   Procedure: TOTAL KNEE ARTHROPLASTY;  Surgeon: [name], MD;  Location: [facility];  Service: Orthopedics;  Laterality: Right;   Patient Active Problem List   Diagnosis Date Noted   Cigarette nicotine dependence without complication 67/10/4101   Coronary artery disease    Paroxysmal atrial fibrillation ([place]) 12/24/2019   Secondary hypercoagulable  state ([place]) 12/24/2019   Dupuytren's contracture 07/02/2018   Spinal stenosis in cervical region 12/17/2017   Carpal tunnel syndrome of left wrist 11/27/2017   Radial nerve palsy, left 11/27/2017   Trigger ring finger of left hand 11/27/2017   Gait abnormality 02/20/2017   Restless leg syndrome 07/05/2016   Constipation due to opioid therapy 12/22/2015   Primary localized osteoarthritis of right knee 12/08/2015   Bullous emphysema ([place]) 11/02/2015   Primary hypertension 11/02/2015   Arthritis 11/02/2015   Hyperlipidemia 11/02/2015   OSA on CPAP 11/02/2015   GERD (gastroesophageal reflux disease) 11/02/2015   Septic olecranon bursitis 06/24/2015   Blepharochalasis 12/29/2014   Hidrocystoma of left eyelid 12/29/2014   Myogenic ptosis of eyelid of both eyes 12/29/2014   Multiple lung nodules on CT 06/30/2014   Combined senile cataract 12/24/2012   HSV epithelial keratitis 12/24/2012   Myopia with astigmatism and presbyopia 12/24/2012   Neurotrophic ulcer ([place]) 12/24/2012    PCP: [name] MD  REFERRING PROVIDER: [name] DO  REFERRING DIAG: cervical radiculopathy  THERAPY DIAG:  Cervical radiculopathy, weakness  Rationale for Evaluation and Treatment: Rehabilitation  ONSET DATE: 10/23/2021  SUBJECTIVE:  SUBJECTIVE STATEMENT: Went to the shooting range yesterday.  I was sore last night.  I wasn't sore at all this morning.     PERTINENT HISTORY:  Both feet numb c/o dec balance Injured neck 25 years ago;  Cervical fusion C5-T3 in 2019.  Currently ruptured disc affecting C8 but surgery too dangerous now, could make the problem worse. Sharp shooting pain in neck and bil UEs especially left UE.  Left hand dominant.  Both hands a lot weaker, drop things often.  I get tired of  shooting pains in arms and legs.  Can hold a pen 20 sec only.  Fatigue with hammering. Difficulty managing a 50# suitcase when traveling recently.  Patient reports my head feels heavy and hard to hold up.   2018-19:  C5-T3 posterior-lateral fusion with decompressive laminectomy from C5-6 to T1-2.  Per Dr. [name] office note: Chronic right hand weakness and paresthesias due to overlapping C8 radiculopathy and entrapment neuropathies. Exam is stable. 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 He has been off IVIG since December 2021.   He has sought second opinion at [facility], who agree that his hand paresthesias and weakness is mostly secondary to C8 radiculopathy with overlapping entrapment neuropathy.  He saw Neurosurgery at [facility] who advised that he is high risk for cervical surgery and recommended conservative therapies.  Management is supportive For pain, continue, continue gabapentin 1264m at bedtime.  For neck pain, restart tizanidine 261mBID as needed Start neck PT UPDATE 07/24/2022:  He feels that his hands are continuing to get weaker and often drops objects.  He denies any worsening numbness/tingling.  He also complains of chronic neck pain and stiffness.  He is not taking tizanidine, but does not recall why he stopped it.  Sometimes, he has shooting pain towards his head.  Balance is also poor.  No falls, walks unassisted.  Pain remains relatively controlled on gabapentin 120083mt bedtime.   HTN Bil Carpal tunnel releases 3 years ago had OT/PT and left elbow release HTN, A-fib, COPD PAIN:  PAIN:  Are you having pain? Yes NPRS scale: 1/10   Pain location: back of the neck shoots up toward head left side worse, also down both arms especially left  Aggravating factors: reclined back;  after a lot of exertion (yardwork, playing  golf) Relieving factors: lying down; sitting up straight   PRECAUTIONS: None "I can do what I feel comfortable doing."   WEIGHT BEARING RESTRICTIONS: No  FALLS:  Has patient fallen in last 6 months? No but balance is horrible (outside in yard or golf course)   OCCUPATION: retired ; enjoys golf can do 18 holes   PLOF: [facility]  PATIENT GOALS: better hand strength, help with neck pain after exertion  OBJECTIVE:   DIAGNOSTIC FINDINGS:  CT on 09/28/2021 IMPRESSION: 1. No acute displaced fracture or traumatic listhesis of the cervical spine in a patient with cervicothoracic posterolateral surgical hardware that is only partially visualized and originates at the C5 level. Persistent hardware lucency surrounding the and screws which is best evaluated on the coronal views and most prominent along the right C5 level. 2. Multilevel degenerative changes of the cervical spine with associated severe osseous neural foraminal stenosis of the non-surgerized levels. 3.  Emphysema (ICD10-J43.9).     NCS/EMG and [place] the upper extremities 10/05/2021: There was electrodiagnostic and sonographic evidence  of severe left median neuropathy at the wrist (carpal tunnel syndrome), left ulnar neuropathy with sonographic localization at the elbow as well as a left subchronic C6-8 cervical radiculopathy. There was no electrodiagnostic evidence of a diffuse large fiber polyneuropathy. There appears to also be sonographic evidence consistent with right median neuropathy at the wrist, as may be seen in carpal tunnel syndrome. This was not fully explored on this study given the extensive nature of prior testing and clinical question asked, but could be explored further on a future study if clinically indicated. Clinical correlation advised.    NCS/EMG of the arms 04/01/2019 This is a complex study of the upper extremities.  Findings are as follows:  Bilateral median neuropathy at or distal to the wrist  (severe), consistent with a clinical diagnosis of carpal tunnel syndrome.   Bilateral ulnar neuropathy with slowing across the elbow, mild on the right and severe on the left. Chronic left posterior interosseous neuropathy, very severe.  Chronic C7-8 radiculopathy affecting bilateral upper extremities, moderate in degree electrically.   MRI cervical spine wo contrast 07/04/2018: 1. C5-T3 posterior-lateral fusion with decompressive laminectomy from C5-6 to T1-2. There is no arthrodesis by CT and the C5 and C6 lateral mass screws are loose. 2. Disc and facet degeneration causes multilevel foraminal impingement. Greatest foraminal impingement is seen on the left at C2-3, bilaterally at C3-4, right at C5-6, and right at T1-2. 3. Widely patent canal at levels of laminectomy. Noncompressive spinal stenosis seen at C3-4 and C4-5. 4. Septated fluid collection within the lower laminectomy defect without dural mass effect, best attributed to seroma.   MRI thoracic spine wo contrast 07/05/2018: Cervical and thoracic fusion and laminectomy. 15 mm fluid collection in the laminectomy bed. No cord compression or cord signal abnormality. Left paracentral disc protrusion T7-8 unchanged from the prior MRI PATIENT SURVEYS:  FOTO 54%  COGNITION: Overall cognitive status: Within functional limits for tasks assessed  SENSATION:  left 5th finger constantly numb, forearm 5-6 years bilaterally Noted atrophy left hypothenar eminence  POSTURE: rounded shoulders and forward head   Decreased cervical and flexor and extensor strength 4-/5 CERVICAL ROM:   Active ROM A/PROM (deg) eval 11/15  Flexion 34 36  Extension 22 40  Right lateral flexion 25 31  Left lateral flexion 20 43  Right rotation 40 55  Left rotation 20 50   (Blank rows = not tested)  UPPER EXTREMITY ROM:  grossly WFLS  UPPER EXTREMITY MMT:  MMT Right eval Left eval 11/22  Shoulder flexion 4+ 4- Left 4  Shoulder extension     Shoulder  abduction 4+ 4- Left 4  Shoulder adduction     Shoulder extension     Shoulder internal rotation     Shoulder external rotation     Middle trapezius 4 4-   Lower trapezius 4 4-   Elbow flexion 5 4   Elbow extension 4 2+   Wrist flexion  2+   Wrist extension  2+   Wrist ulnar deviation     Wrist radial deviation     Wrist pronation     Wrist supination     Grip strength 33 24    (Blank rows = not tested)  Pinch strength right 15#, left 5#  CERVICAL SPECIAL TESTS:  Upper limb tension test (ULTT): Negative and Distraction test: Positive  (seated gentle) No symptoms produced or changed but neural tension bil   TODAY'S TREATMENT:   DATE: 09/27/22  UBE 7 min forward and backward  Triceps 20# 2x10 Flexbar eccentric wrist 10x each side  Holding 10# kettlebell at 90 degrees toe tap to BOSU Blue band shoulder external rotation 15x right/left  5# dumbbell supination 15x right/left Therapeutic activities:   lifting, pushing, pulling  Manual therapy: soft tissue mobilization to bil cervical paraspinals, bil upper traps, bil suboccipitals, right levator scap Trigger Point Dry-Needling  Treatment instructions: Expect mild to moderate muscle soreness. S/S of pneumothorax if dry needled over a lung field, and to seek immediate medical attention should they occur. Patient verbalized understanding of these instructions and education.  Patient Consent Given: Yes Education handout provided: Yes Muscles treated: bil cervical multifidi, bil upper traps,  levator scap; bil suboccipitals Electrical stimulation performed: No Parameters: N/A Treatment response/outcome: improved soft tissue length              DATE: 09/20/22  UBE 7 min forward and backward 3# shoulder 3 ways 10x each Matrix row 30# 15x Lat bar 40# 20x 10# snatch/press overhead 10x right/left  Therapeutic activities:   lifting, pushing, pulling  Manual therapy: soft tissue mobilization to bil cervical paraspinals,  bil upper traps, bil suboccipitals, right levator scap; gentle cervical distraction  Trigger Point Dry-Needling  Treatment instructions: Expect mild to moderate muscle soreness. S/S of pneumothorax if dry needled over a lung field, and to seek immediate medical attention should they occur. Patient verbalized understanding of these instructions and education.  Patient Consent Given: Yes Education handout provided: Yes Muscles treated: bil cervical multifidi, bil upper traps, right levator scap; bil suboccipitals Electrical stimulation performed: No Parameters: N/A Treatment response/outcome: improved soft tissue length            DATE: 09/13/22  UBE 7 min forward and backward Standing neural tensioning 10x right/left Triceps 20# 2x 10 2# shoulder 3 ways 10x each Matrix row 30# 15x Lat bar 40# 20x 10# snatch/press overhead 10x right/left  Therapeutic activities:   lifting, pushing, pulling  Manual therapy: soft tissue mobilization to bil cervical paraspinals, bil upper traps, bil suboccipitals, right levator scap Trigger Point Dry-Needling  Treatment instructions: Expect mild to moderate muscle soreness. S/S of pneumothorax if dry needled over a lung field, and to seek immediate medical attention should they occur. Patient verbalized understanding of these instructions and education.  Patient Consent Given: Yes Education handout provided: Yes Muscles treated: bil cervical multifidi, bil upper traps, right levator scap; bil suboccipitals Electrical stimulation performed: No Parameters: N/A Treatment response/outcome: improved soft tissue length                              PATIENT EDUCATION:  Education details: plan of care  Person educated: Patient Education method: Explanation Education comprehension: verbalized understanding  HOME EXERCISE PROGRAM: Access Code: [email or web address] URL: [email or web address] Date: 08/31/2022 Prepared by: Ruben Im  Exercises - Seated Upper Trapezius Stretch  - 1 x daily - 7 x weekly - 1 sets - 2-3 reps - 30 hold - Seated Cervical Flexion AROM  - 1 x daily - 7 x weekly - 1 sets - 2-3 reps - 30 hold - Seated Scapular Retraction  - 1 x daily - 7 x weekly - 1 sets - 10 reps - Supine Scapular Retraction  - 1 x daily - 7 x weekly - 1 sets - 10 reps - Seated Isometric Cervical Flexion  - 1 x daily - 7 x weekly - 1 sets - 5 reps - 5 hold - Seated Isometric Cervical  Extension  - 1 x daily - 7 x weekly - 1 sets - 5 reps - 5 hold - Seated Isometric Cervical Sidebending  - 1 x daily - 7 x weekly - 1 sets - 5 reps - 5 hold - Seated Isometric Cervical Rotation  - 1 x daily - 7 x weekly - 1 sets - 5 reps - 5 hold - Median Nerve Flossing  - 1 x daily - 7 x weekly - 1 sets - 10 reps - Standing Row with Anchored Resistance  - 1 x daily - 7 x weekly - 3 sets - 10 reps - Shoulder Extension with Resistance Hands Down  - 1 x daily - 7 x weekly - 3 sets - 10 reps  ASSESSMENT:  CLINICAL IMPRESSION: Limited left distal UE strength noted particularly with supination.  Patient able to elbow, wrist and hand exercises today without exacerbation of pain but with difficulty with motor control.  He is challenged balance-wise with single leg standing ex's.  Good response with DN and manual therapy with significant change noted in soft tissue mobility.  He reports he no longer gets sharp, shooting pains when he sits back in the chair.      OBJECTIVE IMPAIRMENTS: decreased activity tolerance, impaired perceived functional ability, impaired flexibility, impaired sensation, impaired UE functional use, postural dysfunction, and pain.   ACTIVITY LIMITATIONS: carrying, lifting, sitting, standing, and reach over head  PARTICIPATION LIMITATIONS: interpersonal relationship, community activity, and yard work  PERSONAL FACTORS: Past/current experiences, Time since onset of injury/illness/exacerbation, and 3+ comorbidities: multi  regions affected, HTN, A-fib, COPD  are also affecting patient's functional outcome.   REHAB POTENTIAL: Good  CLINICAL DECISION MAKING: Evolving/moderate complexity  EVALUATION COMPLEXITY: Moderate   GOALS: Goals reviewed with patient? Yes  SHORT TERM GOALS: Target date: 09/15/2022   The patient will demonstrate knowledge of basic self care strategies and exercises to promote healing   Baseline:  Goal status: goal met 11/22 2.  The patient will report a 30% improvement in pain levels with functional activities   Baseline:  Goal status: ongoing  3.  Cervical flexors and extensors improved to 4/5 for improved tolerance with reclined sitting Baseline:  Goal status: goal met 11/22  4.  Shoulder/scapular strength improved to 4/5 needed for lifting/carrying medium objects for yardwork and traveling Baseline:  Goal status: goal met 11/22    LONG TERM GOALS: Target date: 10/13/2022  The patient will be independent in a safe self progression of a home exercise program to promote further recovery of function   Baseline:  Goal status: INITIAL  2.  The patient will report a 60% improvement in pain levels with functional activities  Baseline:  Goal status: INITIAL  3.  Cervical flexors and extensors improved to 4+/5 for improved tolerance with reclined sitting, playing golf Baseline:  Goal status: INITIAL  4.  Shoulder/scapular strength improved to 4+/5 needed for lifting/carrying medium to heavier objects for yardwork and suitcases for traveling Baseline:  Goal status: INITIAL  5.  The patient will have improved FOTO score to   59%    indicating improved function with less pain  Baseline:  Goal status: INITIAL   PLAN:  PT FREQUENCY: 1-2x/week  PT DURATION: 8 weeks  PLANNED INTERVENTIONS: Therapeutic exercises, Therapeutic activity, Neuromuscular re-education, Balance training, Gait training, Patient/Family education, Self Care, Joint mobilization, Aquatic Therapy, Dry  Needling, Spinal mobilization, Cryotherapy, Moist heat, Taping, Traction, Ultrasound, Manual therapy, and Re-evaluation  PLAN FOR NEXT SESSION:   cervical muscle strengthening,  distal UE strengthening;  UBE and lat bar, counter push ups or quadruped; proximal UE strengthening, assess response to DN cervical multifidi, upper traps left > right and suboccipitals; manual traction light   Ruben Im, PT 09/27/22 4:55 PM Phone: [phone] Fax: [phone]

## 2022-10-04 ENCOUNTER — Other Ambulatory Visit: Payer: Self-pay | Admitting: Cardiology

## 2022-10-04 ENCOUNTER — Ambulatory Visit: Payer: Medicare Other | Admitting: Physical Therapy

## 2022-10-04 DIAGNOSIS — M5412 Radiculopathy, cervical region: Secondary | ICD-10-CM | POA: Diagnosis not present

## 2022-10-04 DIAGNOSIS — M6281 Muscle weakness (generalized): Secondary | ICD-10-CM

## 2022-10-04 NOTE — Therapy (Signed)
OUTPATIENT PHYSICAL THERAPY CERVICAL PROGRESS NOTE   Patient Name: Richard Lynch MRN: [id number] DOB:[birth year], 71 y.o., male Today's Date: 08/18/2022  PT End of Session - 10/04/22 1023     Visit Number 9    Date for PT Re-Evaluation 10/13/22    Authorization Type Medicare    PT Start Time 1023    PT Stop Time 1101    PT Time Calculation (min) 38 min    Activity Tolerance Patient tolerated treatment well               Past Medical History:  Diagnosis Date   Atrial fibrillation ([place])    Constipation due to opioid therapy 12/22/2015   Emphysema lung ([place])    Gait abnormality 02/20/2017   GERD (gastroesophageal reflux disease)    High cholesterol    Hypertension    OSA (obstructive sleep apnea)    on CPAP   Primary localized osteoarthritis of right knee 12/08/2015   Septic olecranon bursitis 06/2015   MSSA   Past Surgical History:  Procedure Laterality Date   COLONOSCOPY W/ BIOPSIES AND POLYPECTOMY     ELBOW SURGERY  08/2015   bilateral   EYE SURGERY     'repair of tear to left eye from pliers'   HAND SURGERY Left 08/2018   Carpal Tunnel Surgery- [place]  07/2010   KNEE SURGERY     3 times on both knee's each   LEFT HEART CATH AND CORONARY ANGIOGRAPHY N/A 04/11/2022   Procedure: LEFT HEART CATH AND CORONARY ANGIOGRAPHY;  Surgeon: [name], MD;  Location: [facility];  Service: Cardiovascular;  Laterality: N/A;   MULTIPLE TOOTH EXTRACTIONS     SHOULDER ARTHROSCOPY     TOTAL HIP ARTHROPLASTY  08/2010   TOTAL KNEE ARTHROPLASTY Right 12/20/2015   TOTAL KNEE ARTHROPLASTY Right 12/20/2015   Procedure: TOTAL KNEE ARTHROPLASTY;  Surgeon: [name], MD;  Location: [facility];  Service: Orthopedics;  Laterality: Right;   Patient Active Problem List   Diagnosis Date Noted   Cigarette nicotine dependence without complication 37/85/8850   Coronary artery disease    Paroxysmal atrial fibrillation ([place]) 12/24/2019   Secondary hypercoagulable  state ([place]) 12/24/2019   Dupuytren's contracture 07/02/2018   Spinal stenosis in cervical region 12/17/2017   Carpal tunnel syndrome of left wrist 11/27/2017   Radial nerve palsy, left 11/27/2017   Trigger ring finger of left hand 11/27/2017   Gait abnormality 02/20/2017   Restless leg syndrome 07/05/2016   Constipation due to opioid therapy 12/22/2015   Primary localized osteoarthritis of right knee 12/08/2015   Bullous emphysema ([place]) 11/02/2015   Primary hypertension 11/02/2015   Arthritis 11/02/2015   Hyperlipidemia 11/02/2015   OSA on CPAP 11/02/2015   GERD (gastroesophageal reflux disease) 11/02/2015   Septic olecranon bursitis 06/24/2015   Blepharochalasis 12/29/2014   Hidrocystoma of left eyelid 12/29/2014   Myogenic ptosis of eyelid of both eyes 12/29/2014   Multiple lung nodules on CT 06/30/2014   Combined senile cataract 12/24/2012   HSV epithelial keratitis 12/24/2012   Myopia with astigmatism and presbyopia 12/24/2012   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  This (PT) has been really beneficial.  I want to keep working on this hand.   PERTINENT HISTORY:  Both feet numb c/o dec balance Injured neck 25 years ago;  Cervical fusion C5-T3 in 2019.  Currently ruptured disc affecting C8 but surgery too dangerous now, could make the problem worse. Sharp shooting pain in neck and bil UEs especially left UE.  Left hand dominant.  Both hands a lot  weaker, drop things often.  I get tired of shooting pains in arms and legs.  Can hold a pen 20 sec only.  Fatigue with hammering. Difficulty managing a 50# suitcase when traveling recently.  Patient reports my head feels heavy and hard to hold up.   2018-19:  C5-T3 posterior-lateral fusion with decompressive laminectomy from C5-6 to T1-2.  Per Dr. [name] office note: Chronic right hand weakness and paresthesias due to overlapping C8 radiculopathy and entrapment neuropathies. Exam is stable. 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 He has been off IVIG since December 2021.   He has sought second opinion at [facility], who agree that his hand paresthesias and weakness is mostly secondary to C8 radiculopathy with overlapping entrapment neuropathy.  He saw Neurosurgery at [facility] who advised that he is high risk for cervical surgery and recommended conservative therapies.  Management is supportive For pain, continue, continue gabapentin 1274m at bedtime.  For neck pain, restart tizanidine 268mBID as needed Start neck PT UPDATE 07/24/2022:  He feels that his hands are continuing to get weaker and often drops objects.  He denies any worsening numbness/tingling.  He also complains of chronic neck pain and stiffness.  He is not taking tizanidine, but does not recall why he stopped it.  Sometimes, he has shooting pain towards his head.  Balance is also poor.  No falls, walks unassisted.  Pain remains relatively controlled on gabapentin 120034mt bedtime.   HTN Bil Carpal tunnel releases 3 years ago had OT/PT and left elbow release HTN, A-fib, COPD PAIN:  PAIN:  Are you having pain? Yes NPRS scale: 1/10   Pain location: back of the neck shoots up toward head left side worse, also down both arms especially left  Aggravating factors: reclined back;  after  a lot of exertion (yardwork, playing golf) Relieving factors: lying down; sitting up straight   PRECAUTIONS: None "I can do what I feel comfortable doing."   WEIGHT BEARING RESTRICTIONS: No  FALLS:  Has patient fallen in last 6 months? No but balance is horrible (outside in yard or golf course)   OCCUPATION: retired ; enjoys golf can do 18 holes   PLOF: Independent  PATIENT GOALS: better hand strength, help with neck pain after exertion  OBJECTIVE:   DIAGNOSTIC FINDINGS:  CT on 09/28/2021 IMPRESSION: 1. No acute displaced fracture or traumatic listhesis of the cervical spine in a patient with cervicothoracic posterolateral surgical hardware that is only partially visualized and originates at the C5 level. Persistent hardware lucency surrounding the and screws which is best evaluated on the coronal views and most prominent along the right C5 level. 2. Multilevel degenerative changes of the cervical spine with associated severe osseous neural foraminal stenosis of the non-surgerized levels. 3.  Emphysema (ICD10-J43.9).     NCS/EMG and [place] the upper extremities  10/05/2021: There was electrodiagnostic and sonographic evidence of severe left median neuropathy at the wrist (carpal tunnel syndrome), left ulnar neuropathy with sonographic localization at the elbow as well as a left subchronic C6-8 cervical radiculopathy. There was no electrodiagnostic evidence of a diffuse large fiber polyneuropathy. There appears to also be sonographic evidence consistent with right median neuropathy at the wrist, as may be seen in carpal tunnel syndrome. This was not fully explored on this study given the extensive nature of prior testing and clinical question asked, but could be explored further on a future study if clinically indicated. Clinical correlation advised.    NCS/EMG of the arms 04/01/2019 This is a complex study of the upper extremities.  Findings are as follows:  Bilateral median  neuropathy at or distal to the wrist (severe), consistent with a clinical diagnosis of carpal tunnel syndrome.   Bilateral ulnar neuropathy with slowing across the elbow, mild on the right and severe on the left. Chronic left posterior interosseous neuropathy, very severe.  Chronic C7-8 radiculopathy affecting bilateral upper extremities, moderate in degree electrically.   MRI cervical spine wo contrast 07/04/2018: 1. C5-T3 posterior-lateral fusion with decompressive laminectomy from C5-6 to T1-2. There is no arthrodesis by CT and the C5 and C6 lateral mass screws are loose. 2. Disc and facet degeneration causes multilevel foraminal impingement. Greatest foraminal impingement is seen on the left at C2-3, bilaterally at C3-4, right at C5-6, and right at T1-2. 3. Widely patent canal at levels of laminectomy. Noncompressive spinal stenosis seen at C3-4 and C4-5. 4. Septated fluid collection within the lower laminectomy defect without dural mass effect, best attributed to seroma.   MRI thoracic spine wo contrast 07/05/2018: Cervical and thoracic fusion and laminectomy. 15 mm fluid collection in the laminectomy bed. No cord compression or cord signal abnormality. Left paracentral disc protrusion T7-8 unchanged from the prior MRI PATIENT SURVEYS:  FOTO 54%  COGNITION: Overall cognitive status: Within functional limits for tasks assessed  SENSATION:  left 5th finger constantly numb, forearm 5-6 years bilaterally Noted atrophy left hypothenar eminence  POSTURE: rounded shoulders and forward head   Decreased cervical and flexor and extensor strength 4-/5 CERVICAL ROM:   Active ROM A/PROM (deg) eval 11/15  Flexion 34 36  Extension 22 40  Right lateral flexion 25 31  Left lateral flexion 20 43  Right rotation 40 55  Left rotation 20 50   (Blank rows = not tested)  UPPER EXTREMITY ROM:  grossly WFLS  UPPER EXTREMITY MMT:  MMT Right eval Left eval 11/22  Shoulder flexion 4+ 4- Left 4   Shoulder extension     Shoulder abduction 4+ 4- Left 4  Shoulder adduction     Shoulder extension     Shoulder internal rotation     Shoulder external rotation     Middle trapezius 4 4-   Lower trapezius 4 4-   Elbow flexion 5 4   Elbow extension 4 2+   Wrist flexion  2+   Wrist extension  2+   Wrist ulnar deviation     Wrist radial deviation     Wrist pronation     Wrist supination     Grip strength 33 24    (Blank rows = not tested)  Pinch strength right 15#, left 5#  CERVICAL SPECIAL TESTS:  Upper limb tension test (ULTT): Negative and Distraction test: Positive  (seated gentle) No symptoms produced or changed but neural tension bil   TODAY'S TREATMENT:   DATE: 10/04/22  UBE 7 min forward and backward Facing wall with red ball at chest "robbers" scapular squeeze 15x WB on Ues in seat of the chair elbow taps 10x2 Standing with green loop around wrists alternating arm clocks 3 ways 10x Holding 3# weight and attached red band supination 10x right/left Holding 3# weight and attached red band wrist flexion and extension 5x right/left (very difficult) Therapeutic activities:   lifting, pushing, pulling  Manual therapy: soft tissue mobilization to bil cervical paraspinals, bil upper traps, bil suboccipitals, right levator scap Trigger Point Dry-Needling  Treatment instructions: Expect mild to moderate muscle soreness. S/S of pneumothorax if dry needled over a lung field, and to seek immediate medical attention should they occur. Patient verbalized understanding of these instructions and education.  Patient Consent Given: Yes Education handout provided: Yes Muscles treated: bil cervical multifidi, bil upper traps,  levator scap; bil suboccipitals Electrical stimulation performed: No Parameters: N/A Treatment response/outcome: improved soft tissue length              DATE: 09/27/22  UBE 7 min forward and backward Triceps 20# 2x10 Flexbar eccentric wrist 10x  each side  Holding 10# kettlebell at 90 degrees toe tap to BOSU Blue band shoulder external rotation 15x right/left  5# dumbbell supination 15x right/left Therapeutic activities:   lifting, pushing, pulling  Manual therapy: soft tissue mobilization to bil cervical paraspinals, bil upper traps, bil suboccipitals, right levator scap Trigger Point Dry-Needling  Treatment instructions: Expect mild to moderate muscle soreness. S/S of pneumothorax if dry needled over a lung field, and to seek immediate medical attention should they occur. Patient verbalized understanding of these instructions and education.  Patient Consent Given: Yes Education handout provided: Yes Muscles treated: bil cervical multifidi, bil upper traps,  levator scap; bil suboccipitals Electrical stimulation performed: No Parameters: N/A Treatment response/outcome: improved soft tissue length              DATE: 09/20/22  UBE 7 min forward and backward 3# shoulder 3 ways 10x each Matrix row 30# 15x Lat bar 40# 20x 10# snatch/press overhead 10x right/left  Therapeutic activities:   lifting, pushing, pulling  Manual therapy: soft tissue mobilization to bil cervical paraspinals, bil upper traps, bil suboccipitals, right levator scap; gentle cervical distraction  Trigger Point Dry-Needling  Treatment instructions: Expect mild to moderate muscle soreness. S/S of pneumothorax if dry needled over a lung field, and to seek immediate medical attention should they occur. Patient verbalized understanding of these instructions and education.  Patient Consent Given: Yes Education handout provided: Yes Muscles treated: bil cervical multifidi, bil upper traps, right levator scap; bil suboccipitals Electrical stimulation performed: No Parameters: N/A Treatment response/outcome: improved soft tissue length      PATIENT EDUCATION:  Education details: plan of care  Person educated: Patient Education method:  Explanation Education comprehension: verbalized understanding  HOME EXERCISE PROGRAM: Access Code: [email or web address] URL: [email or web address] Date: 08/31/2022 Prepared by: Ruben Im  Exercises - Seated Upper Trapezius Stretch  - 1 x daily - 7 x weekly - 1 sets - 2-3 reps - 30 hold - Seated Cervical Flexion AROM  - 1 x daily - 7 x weekly - 1 sets - 2-3 reps - 30 hold - Seated Scapular Retraction  - 1 x daily - 7 x weekly - 1 sets - 10 reps - Supine Scapular Retraction  - 1 x daily - 7 x weekly - 1 sets - 10 reps - Seated Isometric Cervical Flexion  - 1 x daily - 7 x  weekly - 1 sets - 5 reps - 5 hold - Seated Isometric Cervical Extension  - 1 x daily - 7 x weekly - 1 sets - 5 reps - 5 hold - Seated Isometric Cervical Sidebending  - 1 x daily - 7 x weekly - 1 sets - 5 reps - 5 hold - Seated Isometric Cervical Rotation  - 1 x daily - 7 x weekly - 1 sets - 5 reps - 5 hold - Median Nerve Flossing  - 1 x daily - 7 x weekly - 1 sets - 10 reps - Standing Row with Anchored Resistance  - 1 x daily - 7 x weekly - 3 sets - 10 reps - Shoulder Extension with Resistance Hands Down  - 1 x daily - 7 x weekly - 3 sets - 10 reps  ASSESSMENT:  CLINICAL IMPRESSION: Improving proximal glenohumeral and scapular stability and strength.  Left distal strength is impaired particularly with supination, wrist flexion and extension.  He has responded well to DN and manual therapy with significant reduction in pain and much improved soft tissue mobility in cervical musculature.    OBJECTIVE IMPAIRMENTS: decreased activity tolerance, impaired perceived functional ability, impaired flexibility, impaired sensation, impaired UE functional use, postural dysfunction, and pain.   ACTIVITY LIMITATIONS: carrying, lifting, sitting, standing, and reach over head  PARTICIPATION LIMITATIONS: interpersonal relationship, community activity, and yard work  PERSONAL FACTORS: Past/current experiences, Time since onset of  injury/illness/exacerbation, and 3+ comorbidities: multi regions affected, HTN, A-fib, COPD  are also affecting patient's functional outcome.   REHAB POTENTIAL: Good  CLINICAL DECISION MAKING: Evolving/moderate complexity  EVALUATION COMPLEXITY: Moderate   GOALS: Goals reviewed with patient? Yes  SHORT TERM GOALS: Target date: 09/15/2022   The patient will demonstrate knowledge of basic self care strategies and exercises to promote healing   Baseline:  Goal status: goal met 11/22 2.  The patient will report a 30% improvement in pain levels with functional activities   Baseline:  Goal status: ongoing  3.  Cervical flexors and extensors improved to 4/5 for improved tolerance with reclined sitting Baseline:  Goal status: goal met 11/22  4.  Shoulder/scapular strength improved to 4/5 needed for lifting/carrying medium objects for yardwork and traveling Baseline:  Goal status: goal met 11/22    LONG TERM GOALS: Target date: 10/13/2022  The patient will be independent in a safe self progression of a home exercise program to promote further recovery of function   Baseline:  Goal status: INITIAL  2.  The patient will report a 60% improvement in pain levels with functional activities  Baseline:  Goal status: INITIAL  3.  Cervical flexors and extensors improved to 4+/5 for improved tolerance with reclined sitting, playing golf Baseline:  Goal status: INITIAL  4.  Shoulder/scapular strength improved to 4+/5 needed for lifting/carrying medium to heavier objects for yardwork and suitcases for traveling Baseline:  Goal status: INITIAL  5.  The patient will have improved FOTO score to   59%    indicating improved function with less pain  Baseline:  Goal status: INITIAL   PLAN:  PT FREQUENCY: 1-2x/week  PT DURATION: 8 weeks  PLANNED INTERVENTIONS: Therapeutic exercises, Therapeutic activity, Neuromuscular re-education, Balance training, Gait training, Patient/Family  education, Self Care, Joint mobilization, Aquatic Therapy, Dry Needling, Spinal mobilization, Cryotherapy, Moist heat, Taping, Traction, Ultrasound, Manual therapy, and Re-evaluation  PLAN FOR NEXT SESSION: ERO next visit ; FOTO; 10th visit progress note;  cervical muscle strengthening, distal left UE strengthening (try Velcro roller);  UBE and lat bar, counter push ups or quadruped; proximal UE strengthening, assess response to DN cervical multifidi, upper traps left > right and suboccipitals   Ruben Im, PT 10/04/22 4:21 PM Phone: [phone] Fax: [phone]

## 2022-10-11 ENCOUNTER — Ambulatory Visit: Payer: Medicare Other | Admitting: Physical Therapy

## 2022-10-11 DIAGNOSIS — M6281 Muscle weakness (generalized): Secondary | ICD-10-CM

## 2022-10-11 DIAGNOSIS — M5412 Radiculopathy, cervical region: Secondary | ICD-10-CM | POA: Diagnosis not present

## 2022-10-11 NOTE — Therapy (Signed)
OUTPATIENT PHYSICAL THERAPY CERVICAL PROGRESS NOTE/RECERTIFICATION  Progress Note Reporting Period 08/18/22 to 10/11/22  See note below for Objective Data and Assessment of Progress/Goals.     Patient Name: Richard Lynch MRN: [id number] DOB:01-Mar-1951, 71 y.o., male Today's Date: 08/18/2022  PT End of Session - 10/11/22 1020     Visit Number 10    Date for PT Re-Evaluation 12/06/22    Authorization Type Medicare    PT Start Time 1018    PT Stop Time 1059    PT Time Calculation (min) 41 min    Activity Tolerance Patient tolerated treatment well               Past Medical History:  Diagnosis Date   Atrial fibrillation ([place])    Constipation due to opioid therapy 12/22/2015   Emphysema lung ([place])    Gait abnormality 02/20/2017   GERD (gastroesophageal reflux disease)    High cholesterol    Hypertension    OSA (obstructive sleep apnea)    on CPAP   Primary localized osteoarthritis of right knee 12/08/2015   Septic olecranon bursitis 06/2015   MSSA   Past Surgical History:  Procedure Laterality Date   COLONOSCOPY W/ BIOPSIES AND POLYPECTOMY     ELBOW SURGERY  08/2015   bilateral   EYE SURGERY     'repair of tear to left eye from pliers'   HAND SURGERY Left 08/2018   Carpal Tunnel Surgery- [facility]  07/2010   KNEE SURGERY     3 times on both knee's each   LEFT HEART CATH AND CORONARY ANGIOGRAPHY N/A 04/11/2022   Procedure: LEFT HEART CATH AND CORONARY ANGIOGRAPHY;  Surgeon: [name], MD;  Location: [facility];  Service: Cardiovascular;  Laterality: N/A;   MULTIPLE TOOTH EXTRACTIONS     SHOULDER ARTHROSCOPY     TOTAL HIP ARTHROPLASTY  08/2010   TOTAL KNEE ARTHROPLASTY Right 12/20/2015   TOTAL KNEE ARTHROPLASTY Right 12/20/2015   Procedure: TOTAL KNEE ARTHROPLASTY;  Surgeon: [name], MD;  Location: [facility];  Service: Orthopedics;  Laterality: Right;   Patient Active Problem List   Diagnosis Date Noted   Cigarette nicotine  dependence without complication 33/61/2244   Coronary artery disease    Paroxysmal atrial fibrillation ([place]) 12/24/2019   Secondary hypercoagulable state ([place]) 12/24/2019   Dupuytren's contracture 07/02/2018   Spinal stenosis in cervical region 12/17/2017   Carpal tunnel syndrome of left wrist 11/27/2017   Radial nerve palsy, left 11/27/2017   Trigger ring finger of left hand 11/27/2017   Gait abnormality 02/20/2017   Restless leg syndrome 07/05/2016   Constipation due to opioid therapy 12/22/2015   Primary localized osteoarthritis of right knee 12/08/2015   Bullous emphysema ([place]) 11/02/2015   Primary hypertension 11/02/2015   Arthritis 11/02/2015   Hyperlipidemia 11/02/2015   OSA on CPAP 11/02/2015   GERD (gastroesophageal reflux disease) 11/02/2015   Septic olecranon bursitis 06/24/2015   Blepharochalasis 12/29/2014   Hidrocystoma of left eyelid 12/29/2014   Myogenic ptosis of eyelid of both eyes 12/29/2014   Multiple lung nodules on CT 06/30/2014   Combined senile cataract 12/24/2012   HSV epithelial keratitis 12/24/2012   Myopia with astigmatism and presbyopia 12/24/2012   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  Rates overall progress since start of care at 20%.  Complains of difficulty holding a fork in his left hand and cutting things is also difficult   PERTINENT HISTORY:  Both feet numb c/o dec balance Injured neck 25  years ago;  Cervical fusion C5-T3 in 2019.  Currently ruptured disc affecting C8 but surgery too dangerous now, could make the problem worse. Sharp shooting pain in neck and bil UEs especially left UE.  Left hand dominant.  Both hands a lot weaker, drop things often.  I get tired of shooting pains in arms and legs.  Can hold a pen 20 sec only.  Fatigue with hammering. Difficulty managing a 50# suitcase when traveling recently.  Patient reports my head feels heavy and hard to hold up.   2018-19:  C5-T3 posterior-lateral fusion with decompressive laminectomy from C5-6 to T1-2.  Per Dr. [name] office note: Chronic right hand weakness and paresthesias due to overlapping C8 radiculopathy and entrapment neuropathies. Exam is stable. 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 He has been off IVIG since December 2021.   He has sought second opinion at [facility], who agree that his hand paresthesias and weakness is mostly secondary to C8 radiculopathy with overlapping entrapment neuropathy.  He saw Neurosurgery at [facility] who advised that he is high risk for cervical surgery and recommended conservative therapies.  Management is supportive For pain, continue, continue gabapentin 12100m at bedtime.  For neck pain, restart tizanidine 254mBID as needed Start neck PT UPDATE 07/24/2022:  He feels that his hands are continuing to get weaker and often drops objects.  He denies any worsening numbness/tingling.  He also complains of chronic neck pain and stiffness.  He is not taking tizanidine, but does not recall why he stopped it.  Sometimes, he has shooting pain towards his head.  Balance is also poor.  No falls, walks unassisted.  Pain remains relatively controlled on gabapentin 120068mt bedtime.   HTN Bil Carpal tunnel releases 3 years ago had OT/PT and left elbow  release HTN, A-fib, COPD PAIN:  PAIN:  Are you having pain? Yes NPRS scale: 1/10   Pain location: back of the neck shoots up toward head left side worse, also down both arms especially left  Aggravating factors: reclined back;  after a lot of exertion (yardwork, playing golf) Relieving factors: lying down; sitting up straight   PRECAUTIONS: None "I can do what I feel comfortable doing."   WEIGHT BEARING RESTRICTIONS: No  FALLS:  Has patient fallen in last 6 months? No but balance is horrible (outside in yard or golf course)   OCCUPATION: retired ; enjoys golf can do 18 holes   PLOF: Independent  PATIENT GOALS: better hand strength, help with neck pain after exertion  OBJECTIVE:   DIAGNOSTIC FINDINGS:  CT on 09/28/2021 IMPRESSION: 1. No acute displaced fracture or traumatic listhesis of the cervical spine in a patient with cervicothoracic posterolateral surgical hardware that is only partially visualized and originates at the C5 level. Persistent hardware lucency surrounding the and screws which is best evaluated on the coronal views and most prominent along the right C5 level. 2. Multilevel degenerative changes of the cervical spine with associated severe osseous neural foraminal stenosis of the  non-surgerized levels. 3.  Emphysema (ICD10-J43.9).     NCS/EMG and US of the upper extremities 10/05/2021: There was electrodiagnostic and sonographic evidence of severe left median neuropathy at the wrist (carpal tunnel syndrome), left ulnar neuropathy with sonographic localization at the elbow as well as a left subchronic C6-8 cervical radiculopathy. There was no electrodiagnostic evidence of a diffuse large fiber polyneuropathy. There appears to also be sonographic evidence consistent with right median neuropathy at the wrist, as may be seen in carpal tunnel syndrome. This was not fully explored on this study given the extensive nature of prior testing and clinical question  asked, but could be explored further on a future study if clinically indicated. Clinical correlation advised.    NCS/EMG of the arms 04/01/2019 This is a complex study of the upper extremities.  Findings are as follows:  Bilateral median neuropathy at or distal to the wrist (severe), consistent with a clinical diagnosis of carpal tunnel syndrome.   Bilateral ulnar neuropathy with slowing across the elbow, mild on the right and severe on the left. Chronic left posterior interosseous neuropathy, very severe.  Chronic C7-8 radiculopathy affecting bilateral upper extremities, moderate in degree electrically.   MRI cervical spine wo contrast 07/04/2018: 1. C5-T3 posterior-lateral fusion with decompressive laminectomy from C5-6 to T1-2. There is no arthrodesis by CT and the C5 and C6 lateral mass screws are loose. 2. Disc and facet degeneration causes multilevel foraminal impingement. Greatest foraminal impingement is seen on the left at C2-3, bilaterally at C3-4, right at C5-6, and right at T1-2. 3. Widely patent canal at levels of laminectomy. Noncompressive spinal stenosis seen at C3-4 and C4-5. 4. Septated fluid collection within the lower laminectomy defect without dural mass effect, best attributed to seroma.   MRI thoracic spine wo contrast 07/05/2018: Cervical and thoracic fusion and laminectomy. 15 mm fluid collection in the laminectomy bed. No cord compression or cord signal abnormality. Left paracentral disc protrusion T7-8 unchanged from the prior MRI PATIENT SURVEYS:  FOTO 54% 12/20: 54%  COGNITION: Overall cognitive status: Within functional limits for tasks assessed  SENSATION:  left 5th finger constantly numb, forearm 5-6 years bilaterally Noted atrophy left hypothenar eminence  POSTURE: rounded shoulders and forward head   Decreased cervical and flexor and extensor strength 4-/5 12/20:  cervical and flexor and extensor strength 4/5 CERVICAL ROM:   Active ROM A/PROM  (deg) eval 11/15 12/20  Flexion 34 36 32  Extension 22 40 34  Right lateral flexion _0 Left lateral flexion 20 43 38  Right rotation 40 55 55  Left rotation 20 50 50   (Blank rows = not tested)  UPPER EXTREMITY ROM:  grossly WFLS  UPPER EXTREMITY MMT:  MMT Right eval Left eval 11/22 12/20  Shoulder flexion 4+ 4- Left 4 4+  Shoulder extension      Shoulder abduction 4+ 4- Left 4 4+  Shoulder adduction      Shoulder extension      Shoulder internal rotation      Shoulder external rotation      Middle trapezius 4 4-  4  Lower trapezius 4 4-  4  Elbow flexion 5 4    Elbow extension 4 2+  4  Wrist flexion  2+  Left 3-  Wrist extension  2+  Left 3-  Wrist ulnar deviation      Wrist radial deviation      Wrist pronation      Wrist supination  Grip strength 33 24  25 left; 46 right    (Blank rows = not tested)  Pinch strength right 15#, left 5#  CERVICAL SPECIAL TESTS:  Upper limb tension test (ULTT): Negative and Distraction test: Positive  (seated gentle) No symptoms produced or changed but neural tension bil   TODAY'S TREATMENT:   DATE: 10/11/22  UBE 7 min forward and backward Facing wall with red ball at chest "robbers" scapular squeeze and bil arm elevation 15x Triceps 20# 2x10 Velcro board left 3 tools 3 laps each Therapeutic activities:   lifting, pushing, pulling  Manual therapy: soft tissue mobilization to bil cervical paraspinals, bil upper traps, bil suboccipitals, right levator scap Trigger Point Dry-Needling  Treatment instructions: Expect mild to moderate muscle soreness. S/S of pneumothorax if dry needled over a lung field, and to seek immediate medical attention should they occur. Patient verbalized understanding of these instructions and education.  Patient Consent Given: Yes Education handout provided: Yes Muscles treated: bil cervical multifidi, bil upper traps,  levator scap; bil suboccipitals Electrical stimulation performed:  No Parameters: N/A Treatment response/outcome: improved soft tissue length            DATE: 10/04/22  UBE 7 min forward and backward Facing wall with red ball at chest "robbers" scapular squeeze 15x WB on Ues in seat of the chair elbow taps 10x2 Standing with green loop around wrists alternating arm clocks 3 ways 10x Holding 3# weight and attached red band supination 10x right/left Holding 3# weight and attached red band wrist flexion and extension 5x right/left (very difficult) Therapeutic activities:   lifting, pushing, pulling  Manual therapy: soft tissue mobilization to bil cervical paraspinals, bil upper traps, bil suboccipitals, right levator scap Trigger Point Dry-Needling  Treatment instructions: Expect mild to moderate muscle soreness. S/S of pneumothorax if dry needled over a lung field, and to seek immediate medical attention should they occur. Patient verbalized understanding of these instructions and education.  Patient Consent Given: Yes Education handout provided: Yes Muscles treated: bil cervical multifidi, bil upper traps,  levator scap; bil suboccipitals Electrical stimulation performed: No Parameters: N/A Treatment response/outcome: improved soft tissue length              DATE: 09/27/22  UBE 7 min forward and backward Triceps 20# 2x10 Flexbar eccentric wrist 10x each side  Holding 10# kettlebell at 90 degrees toe tap to BOSU Blue band shoulder external rotation 15x right/left  5# dumbbell supination 15x right/left Therapeutic activities:   lifting, pushing, pulling  Manual therapy: soft tissue mobilization to bil cervical paraspinals, bil upper traps, bil suboccipitals, right levator scap Trigger Point Dry-Needling  Treatment instructions: Expect mild to moderate muscle soreness. S/S of pneumothorax if dry needled over a lung field, and to seek immediate medical attention should they occur. Patient verbalized understanding of these  instructions and education.  Patient Consent Given: Yes Education handout provided: Yes Muscles treated: bil cervical multifidi, bil upper traps,  levator scap; bil suboccipitals Electrical stimulation performed: No Parameters: N/A Treatment response/outcome: improved soft tissue length           PATIENT EDUCATION:  Education details: plan of care  Person educated: Patient Education method: Explanation Education comprehension: verbalized understanding  HOME EXERCISE PROGRAM: Access Code: [email or web address] URL: [email or web address] Date: 08/31/2022 Prepared by: Ruben Im  Exercises - Seated Upper Trapezius Stretch  - 1 x daily - 7 x weekly - 1 sets - 2-3 reps - 30 hold - Seated Cervical Flexion AROM  - 1 x  daily - 7 x weekly - 1 sets - 2-3 reps - 30 hold - Seated Scapular Retraction  - 1 x daily - 7 x weekly - 1 sets - 10 reps - Supine Scapular Retraction  - 1 x daily - 7 x weekly - 1 sets - 10 reps - Seated Isometric Cervical Flexion  - 1 x daily - 7 x weekly - 1 sets - 5 reps - 5 hold - Seated Isometric Cervical Extension  - 1 x daily - 7 x weekly - 1 sets - 5 reps - 5 hold - Seated Isometric Cervical Sidebending  - 1 x daily - 7 x weekly - 1 sets - 5 reps - 5 hold - Seated Isometric Cervical Rotation  - 1 x daily - 7 x weekly - 1 sets - 5 reps - 5 hold - Median Nerve Flossing  - 1 x daily - 7 x weekly - 1 sets - 10 reps - Standing Row with Anchored Resistance  - 1 x daily - 7 x weekly - 3 sets - 10 reps - Shoulder Extension with Resistance Hands Down  - 1 x daily - 7 x weekly - 3 sets - 10 reps  ASSESSMENT:  CLINICAL IMPRESSION: The patient reports an improvement in overall neck pain since start of care but is still highly impaired by lack of strength especially in his left hand.  His cervical ROM has improved in multiple directions and proximal strength has improved as well.  The patient would benefit from a continuation of skilled PT for a further  progression of strengthening and functional mobility.  Will continue to update and promote independence in a HEP needed for a return to the highest functional level possible with ADLs.     OBJECTIVE IMPAIRMENTS: decreased activity tolerance, impaired perceived functional ability, impaired flexibility, impaired sensation, impaired UE functional use, postural dysfunction, and pain.   ACTIVITY LIMITATIONS: carrying, lifting, sitting, standing, and reach over head  PARTICIPATION LIMITATIONS: interpersonal relationship, community activity, and yard work  PERSONAL FACTORS: Past/current experiences, Time since onset of injury/illness/exacerbation, and 3+ comorbidities: multi regions affected, HTN, A-fib, COPD  are also affecting patient's functional outcome.   REHAB POTENTIAL: Good  CLINICAL DECISION MAKING: Evolving/moderate complexity  EVALUATION COMPLEXITY: Moderate   GOALS: Goals reviewed with patient? Yes  SHORT TERM GOALS: Target date: 09/15/2022   The patient will demonstrate knowledge of basic self care strategies and exercises to promote healing   Baseline:  Goal status: goal met 11/22 2.  The patient will report a 30% improvement in pain levels with functional activities   Baseline:  Goal status: ongoing  3.  Cervical flexors and extensors improved to 4/5 for improved tolerance with reclined sitting Baseline:  Goal status: goal met 11/22  4.  Shoulder/scapular strength improved to 4/5 needed for lifting/carrying medium objects for yardwork and traveling Baseline:  Goal status: goal met 11/22    LONG TERM GOALS: Target date: 12/06/2022  The patient will be independent in a safe self progression of a home exercise program to promote further recovery of function   Baseline:  Goal status: ongoing  2.  The patient will report a 60% improvement in pain levels with functional activities  Baseline:   30% on 12/20 Goal status: ongoing 3.  Cervical flexors and extensors  improved to 4+/5 for improved tolerance with reclined sitting, playing golf Baseline:  Goal status: ongoing  4.  Shoulder/scapular strength improved to 4+/5 needed for lifting/carrying medium to heavier objects for yardwork  and suitcases for traveling Baseline:  Goal status: partially met 5.  The patient will have improved FOTO score to   59%    indicating improved function with less pain  Baseline:  Goal status: ongoing  6. Improved left grip strength to 27# for holding a fork and cutting things  NEW   PLAN:  PT FREQUENCY: 1-2x/week  PT DURATION: 8 weeks  PLANNED INTERVENTIONS: Therapeutic exercises, Therapeutic activity, Neuromuscular re-education, Balance training, Gait training, Patient/Family education, Self Care, Joint mobilization, Aquatic Therapy, Dry Needling, Spinal mobilization, Cryotherapy, Moist heat, Taping, Traction, Ultrasound, Manual therapy, and Re-evaluation  PLAN FOR NEXT SESSION:  cervical muscle strengthening, distal left UE strengthening (try Velcro roller);  UBE and lat bar, counter push ups or quadruped; proximal UE strengthening, assess response to DN cervical multifidi, upper traps left > right and suboccipitals   Ruben Im, PT 10/11/22 1:01 PM Phone: [phone] Fax: [phone]

## 2022-10-18 ENCOUNTER — Ambulatory Visit: Payer: Medicare Other | Admitting: Physical Therapy

## 2022-10-18 DIAGNOSIS — M5412 Radiculopathy, cervical region: Secondary | ICD-10-CM | POA: Diagnosis not present

## 2022-10-18 DIAGNOSIS — M6281 Muscle weakness (generalized): Secondary | ICD-10-CM

## 2022-10-18 NOTE — Therapy (Signed)
OUTPATIENT PHYSICAL THERAPY CERVICAL PROGRESS NOTE/RECERTIFICATION  Progress Note Reporting Period 08/18/22 to 10/11/22  See note below for Objective Data and Assessment of Progress/Goals.     Patient Name: Richard Lynch MRN: [id number] DOB:1951-05-18, 71 y.o., male Today's Date: 08/18/2022  PT End of Session - 10/18/22 1024     Visit Number 11    Date for PT Re-Evaluation 12/06/22    Authorization Type Medicare    PT Start Time 1016    PT Stop Time 1058    PT Time Calculation (min) 42 min    Activity Tolerance Patient tolerated treatment well               Past Medical History:  Diagnosis Date   Atrial fibrillation ([place])    Constipation due to opioid therapy 12/22/2015   Emphysema lung ([place])    Gait abnormality 02/20/2017   GERD (gastroesophageal reflux disease)    High cholesterol    Hypertension    OSA (obstructive sleep apnea)    on CPAP   Primary localized osteoarthritis of right knee 12/08/2015   Septic olecranon bursitis 06/2015   MSSA   Past Surgical History:  Procedure Laterality Date   COLONOSCOPY W/ BIOPSIES AND POLYPECTOMY     ELBOW SURGERY  08/2015   bilateral   EYE SURGERY     'repair of tear to left eye from pliers'   HAND SURGERY Left 08/2018   Carpal Tunnel Surgery- [place]  07/2010   KNEE SURGERY     3 times on both knee's each   LEFT HEART CATH AND CORONARY ANGIOGRAPHY N/A 04/11/2022   Procedure: LEFT HEART CATH AND CORONARY ANGIOGRAPHY;  Surgeon: [name], MD;  Location: [facility];  Service: Cardiovascular;  Laterality: N/A;   MULTIPLE TOOTH EXTRACTIONS     SHOULDER ARTHROSCOPY     TOTAL HIP ARTHROPLASTY  08/2010   TOTAL KNEE ARTHROPLASTY Right 12/20/2015   TOTAL KNEE ARTHROPLASTY Right 12/20/2015   Procedure: TOTAL KNEE ARTHROPLASTY;  Surgeon: [name], MD;  Location: [facility];  Service: Orthopedics;  Laterality: Right;   Patient Active Problem List   Diagnosis Date Noted   Cigarette nicotine  dependence without complication 85/46/2703   Coronary artery disease    Paroxysmal atrial fibrillation ([place]) 12/24/2019   Secondary hypercoagulable state ([place]) 12/24/2019   Dupuytren's contracture 07/02/2018   Spinal stenosis in cervical region 12/17/2017   Carpal tunnel syndrome of left wrist 11/27/2017   Radial nerve palsy, left 11/27/2017   Trigger ring finger of left hand 11/27/2017   Gait abnormality 02/20/2017   Restless leg syndrome 07/05/2016   Constipation due to opioid therapy 12/22/2015   Primary localized osteoarthritis of right knee 12/08/2015   Bullous emphysema ([place]) 11/02/2015   Primary hypertension 11/02/2015   Arthritis 11/02/2015   Hyperlipidemia 11/02/2015   OSA on CPAP 11/02/2015   GERD (gastroesophageal reflux disease) 11/02/2015   Septic olecranon bursitis 06/24/2015   Blepharochalasis 12/29/2014   Hidrocystoma of left eyelid 12/29/2014   Myogenic ptosis of eyelid of both eyes 12/29/2014   Multiple lung nodules on CT 06/30/2014   Combined senile cataract 12/24/2012   HSV epithelial keratitis 12/24/2012   Myopia with astigmatism and presbyopia 12/24/2012   Neurotrophic ulcer ([place]) 12/24/2012    PCP: [name] MD  REFERRING PROVIDER: [name] DO  REFERRING DIAG: cervical radiculopathy  THERAPY DIAG:  Cervical radiculopathy, weakness  Rationale for Evaluation and Treatment: Rehabilitation  ONSET DATE: 10/23/2021  SUBJECTIVE:  SUBJECTIVE STATEMENT: Pain has moved over to the right side of the neck when recline back in the chair. PERTINENT HISTORY:  Both feet numb c/o dec balance Injured neck 25 years ago;  Cervical fusion C5-T3 in 2019.  Currently ruptured disc affecting C8 but surgery too dangerous now, could make the problem worse. Sharp  shooting pain in neck and bil UEs especially left UE.  Left hand dominant.  Both hands a lot weaker, drop things often.  I get tired of shooting pains in arms and legs.  Can hold a pen 20 sec only.  Fatigue with hammering. Difficulty managing a 50# suitcase when traveling recently.  Patient reports my head feels heavy and hard to hold up.   2018-19:  C5-T3 posterior-lateral fusion with decompressive laminectomy from C5-6 to T1-2.  Per Dr. [name] office note: Chronic right hand weakness and paresthesias due to overlapping C8 radiculopathy and entrapment neuropathies. Exam is stable. 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 He has been off IVIG since December 2021.   He has sought second opinion at [facility], who agree that his hand paresthesias and weakness is mostly secondary to C8 radiculopathy with overlapping entrapment neuropathy.  He saw Neurosurgery at [facility] who advised that he is high risk for cervical surgery and recommended conservative therapies.  Management is supportive For pain, continue, continue gabapentin 1281m at bedtime.  For neck pain, restart tizanidine 222mBID as needed Start neck PT UPDATE 07/24/2022:  He feels that his hands are continuing to get weaker and often drops objects.  He denies any worsening numbness/tingling.  He also complains of chronic neck pain and stiffness.  He is not taking tizanidine, but does not recall why he stopped it.  Sometimes, he has shooting pain towards his head.  Balance is also poor.  No falls, walks unassisted.  Pain remains relatively controlled on gabapentin 120043mt bedtime.   HTN Bil Carpal tunnel releases 3 years ago had OT/PT and left elbow release HTN, A-fib, COPD PAIN:  PAIN:  Are you having pain? Yes NPRS scale: 3-4/10   Pain location: right neck Aggravating factors: reclined  back;  after a lot of exertion (yardwork, playing golf) Relieving factors: lying down; sitting up straight   PRECAUTIONS: None "I can do what I feel comfortable doing."   WEIGHT BEARING RESTRICTIONS: No  FALLS:  Has patient fallen in last 6 months? No but balance is horrible (outside in yard or golf course)   OCCUPATION: retired ; enjoys golf can do 18 holes   PLOF: Independent  PATIENT GOALS: better hand strength, help with neck pain after exertion  OBJECTIVE:   DIAGNOSTIC FINDINGS:  CT on 09/28/2021 IMPRESSION: 1. No acute displaced fracture or traumatic listhesis of the cervical spine in a patient with cervicothoracic posterolateral surgical hardware that is only partially visualized and originates at the C5 level. Persistent hardware lucency surrounding the and screws which is best evaluated on the coronal views and most prominent along the right C5 level. 2. Multilevel degenerative changes of the cervical spine with associated severe osseous neural foraminal stenosis of the non-surgerized levels. 3.  Emphysema (ICD10-J43.9).     NCS/EMG and [place] the upper extremities 10/05/2021: There was electrodiagnostic and sonographic evidence of severe left median neuropathy at the wrist (carpal tunnel syndrome), left ulnar neuropathy with sonographic localization at the elbow as well as  a left subchronic C6-8 cervical radiculopathy. There was no electrodiagnostic evidence of a diffuse large fiber polyneuropathy. There appears to also be sonographic evidence consistent with right median neuropathy at the wrist, as may be seen in carpal tunnel syndrome. This was not fully explored on this study given the extensive nature of prior testing and clinical question asked, but could be explored further on a future study if clinically indicated. Clinical correlation advised.    NCS/EMG of the arms 04/01/2019 This is a complex study of the upper extremities.  Findings are as follows:  Bilateral  median neuropathy at or distal to the wrist (severe), consistent with a clinical diagnosis of carpal tunnel syndrome.   Bilateral ulnar neuropathy with slowing across the elbow, mild on the right and severe on the left. Chronic left posterior interosseous neuropathy, very severe.  Chronic C7-8 radiculopathy affecting bilateral upper extremities, moderate in degree electrically.   MRI cervical spine wo contrast 07/04/2018: 1. C5-T3 posterior-lateral fusion with decompressive laminectomy from C5-6 to T1-2. There is no arthrodesis by CT and the C5 and C6 lateral mass screws are loose. 2. Disc and facet degeneration causes multilevel foraminal impingement. Greatest foraminal impingement is seen on the left at C2-3, bilaterally at C3-4, right at C5-6, and right at T1-2. 3. Widely patent canal at levels of laminectomy. Noncompressive spinal stenosis seen at C3-4 and C4-5. 4. Septated fluid collection within the lower laminectomy defect without dural mass effect, best attributed to seroma.   MRI thoracic spine wo contrast 07/05/2018: Cervical and thoracic fusion and laminectomy. 15 mm fluid collection in the laminectomy bed. No cord compression or cord signal abnormality. Left paracentral disc protrusion T7-8 unchanged from the prior MRI PATIENT SURVEYS:  FOTO 54% 12/20: 54%  COGNITION: Overall cognitive status: Within functional limits for tasks assessed  SENSATION:  left 5th finger constantly numb, forearm 5-6 years bilaterally Noted atrophy left hypothenar eminence  POSTURE: rounded shoulders and forward head   Decreased cervical and flexor and extensor strength 4-/5 12/20:  cervical and flexor and extensor strength 4/5 CERVICAL ROM:   Active ROM A/PROM (deg) eval 11/15 12/20  Flexion 34 36 32  Extension 22 40 34  Right lateral flexion _0 Left lateral flexion 20 43 38  Right rotation 40 55 55  Left rotation 20 50 50   (Blank rows = not tested)  UPPER EXTREMITY ROM:   grossly WFLS  UPPER EXTREMITY MMT:  MMT Right eval Left eval 11/22 12/20  Shoulder flexion 4+ 4- Left 4 4+  Shoulder extension      Shoulder abduction 4+ 4- Left 4 4+  Shoulder adduction      Shoulder extension      Shoulder internal rotation      Shoulder external rotation      Middle trapezius 4 4-  4  Lower trapezius 4 4-  4  Elbow flexion 5 4    Elbow extension 4 2+  4  Wrist flexion  2+  Left 3-  Wrist extension  2+  Left 3-  Wrist ulnar deviation      Wrist radial deviation      Wrist pronation      Wrist supination      Grip strength 33 24  25 left; 46 right    (Blank rows = not tested)  Pinch strength right 15#, left 5#  CERVICAL SPECIAL TESTS:  Upper limb tension test (ULTT): Negative and Distraction test: Positive  (seated gentle) No symptoms produced or  changed but neural tension bil   TODAY'S TREATMENT:   DATE: 10/18/22  Nu-Step 6 min L5 while discussing status Green loop slide up wall lift offs at the top 10x 2# red plyo ball on wall clocks 10x right/left 7# snatch/press overhead 10x right/left  Triceps 20# 2x10 Flex bar eccentric wrist extension/flexion 10x right/left Finger web: finger flexion and extension, gross and individual  Therapeutic activities:   lifting, pushing, pulling  Manual therapy: soft tissue mobilization to bil cervical paraspinals, bil upper traps, bil suboccipitals, right levator scap Trigger Point Dry-Needling  Treatment instructions: Expect mild to moderate muscle soreness. S/S of pneumothorax if dry needled over a lung field, and to seek immediate medical attention should they occur. Patient verbalized understanding of these instructions and education.  Patient Consent Given: Yes Education handout provided: Yes Muscles treated: right cervical multifidi, right upper traps, right levator scap Electrical stimulation performed: No Parameters: N/A Treatment response/outcome: improved soft tissue length       DATE: 10/11/22   UBE 7 min forward and backward Facing wall with red ball at chest "robbers" scapular squeeze and bil arm elevation 15x Triceps 20# 2x10 Velcro board left 3 tools 3 laps each Therapeutic activities:   lifting, pushing, pulling  Manual therapy: soft tissue mobilization to bil cervical paraspinals, bil upper traps, bil suboccipitals, right levator scap Trigger Point Dry-Needling  Treatment instructions: Expect mild to moderate muscle soreness. S/S of pneumothorax if dry needled over a lung field, and to seek immediate medical attention should they occur. Patient verbalized understanding of these instructions and education.  Patient Consent Given: Yes Education handout provided: Yes Muscles treated: bil cervical multifidi, bil upper traps,  levator scap; bil suboccipitals Electrical stimulation performed: No Parameters: N/A Treatment response/outcome: improved soft tissue length      DATE: 10/04/22  UBE 7 min forward and backward Facing wall with red ball at chest "robbers" scapular squeeze 15x WB on Ues in seat of the chair elbow taps 10x2 Standing with green loop around wrists alternating arm clocks 3 ways 10x Holding 3# weight and attached red band supination 10x right/left Holding 3# weight and attached red band wrist flexion and extension 5x right/left (very difficult) Therapeutic activities:   lifting, pushing, pulling  Manual therapy: soft tissue mobilization to bil cervical paraspinals, bil upper traps, bil suboccipitals, right levator scap Trigger Point Dry-Needling  Treatment instructions: Expect mild to moderate muscle soreness. S/S of pneumothorax if dry needled over a lung field, and to seek immediate medical attention should they occur. Patient verbalized understanding of these instructions and education.  Patient Consent Given: Yes Education handout provided: Yes Muscles treated: bil cervical multifidi, bil upper traps,  levator scap; bil suboccipitals Electrical  stimulation performed: No Parameters: N/A Treatment response/outcome: improved soft tissue length  PATIENT EDUCATION:  Education details: plan of care  Person educated: Patient Education method: Explanation Education comprehension: verbalized understanding  HOME EXERCISE PROGRAM: Access Code: [email or web address] URL: [email or web address] Date: 08/31/2022 Prepared by: Ruben Im  Exercises - Seated Upper Trapezius Stretch  - 1 x daily - 7 x weekly - 1 sets - 2-3 reps - 30 hold - Seated Cervical Flexion AROM  - 1 x daily - 7 x weekly - 1 sets - 2-3 reps - 30 hold - Seated Scapular Retraction  - 1 x daily - 7 x weekly - 1 sets - 10 reps - Supine Scapular Retraction  - 1 x daily - 7 x weekly - 1 sets - 10 reps - Seated Isometric Cervical  Flexion  - 1 x daily - 7 x weekly - 1 sets - 5 reps - 5 hold - Seated Isometric Cervical Extension  - 1 x daily - 7 x weekly - 1 sets - 5 reps - 5 hold - Seated Isometric Cervical Sidebending  - 1 x daily - 7 x weekly - 1 sets - 5 reps - 5 hold - Seated Isometric Cervical Rotation  - 1 x daily - 7 x weekly - 1 sets - 5 reps - 5 hold - Median Nerve Flossing  - 1 x daily - 7 x weekly - 1 sets - 10 reps - Standing Row with Anchored Resistance  - 1 x daily - 7 x weekly - 3 sets - 10 reps - Shoulder Extension with Resistance Hands Down  - 1 x daily - 7 x weekly - 3 sets - 10 reps  ASSESSMENT:  CLINICAL IMPRESSION: Good proximal shoulder stability.  Distal weakness particularly with fine motor tasks.  DN and manual therapy focused on right cervical myofascial tender points with multiple twitch responses produced which is a good prognostic indicator.       OBJECTIVE IMPAIRMENTS: decreased activity tolerance, impaired perceived functional ability, impaired flexibility, impaired sensation, impaired UE functional use, postural dysfunction, and pain.   ACTIVITY LIMITATIONS: carrying, lifting, sitting, standing, and reach over head  PARTICIPATION  LIMITATIONS: interpersonal relationship, community activity, and yard work  PERSONAL FACTORS: Past/current experiences, Time since onset of injury/illness/exacerbation, and 3+ comorbidities: multi regions affected, HTN, A-fib, COPD  are also affecting patient's functional outcome.   REHAB POTENTIAL: Good  CLINICAL DECISION MAKING: Evolving/moderate complexity  EVALUATION COMPLEXITY: Moderate   GOALS: Goals reviewed with patient? Yes  SHORT TERM GOALS: Target date: 09/15/2022   The patient will demonstrate knowledge of basic self care strategies and exercises to promote healing   Baseline:  Goal status: goal met 11/22 2.  The patient will report a 30% improvement in pain levels with functional activities   Baseline:  Goal status: ongoing  3.  Cervical flexors and extensors improved to 4/5 for improved tolerance with reclined sitting Baseline:  Goal status: goal met 11/22  4.  Shoulder/scapular strength improved to 4/5 needed for lifting/carrying medium objects for yardwork and traveling Baseline:  Goal status: goal met 11/22    LONG TERM GOALS: Target date: 12/06/2022  The patient will be independent in a safe self progression of a home exercise program to promote further recovery of function   Baseline:  Goal status: ongoing  2.  The patient will report a 60% improvement in pain levels with functional activities  Baseline:   30% on 12/20 Goal status: ongoing 3.  Cervical flexors and extensors improved to 4+/5 for improved tolerance with reclined sitting, playing golf Baseline:  Goal status: ongoing  4.  Shoulder/scapular strength improved to 4+/5 needed for lifting/carrying medium to heavier objects for yardwork and suitcases for traveling Baseline:  Goal status: partially met 5.  The patient will have improved FOTO score to   59%    indicating improved function with less pain  Baseline:  Goal status: ongoing  6. Improved left grip strength to 27# for holding a  fork and cutting things  NEW   PLAN:  PT FREQUENCY: 1-2x/week  PT DURATION: 8 weeks  PLANNED INTERVENTIONS: Therapeutic exercises, Therapeutic activity, Neuromuscular re-education, Balance training, Gait training, Patient/Family education, Self Care, Joint mobilization, Aquatic Therapy, Dry Needling, Spinal mobilization, Cryotherapy, Moist heat, Taping, Traction, Ultrasound, Manual therapy, and Re-evaluation  PLAN FOR  NEXT SESSION:  cervical muscle strengthening, distal left UE strengthening;  UBE and lat bar, counter push ups or quadruped; proximal UE strengthening, assess response to DN cervical multifidi, upper traps left > right and suboccipitals   Ruben Im, PT 10/18/22 11:11 AM Phone: [phone] Fax: [phone]

## 2022-10-26 ENCOUNTER — Ambulatory Visit: Payer: Medicare Other | Attending: Neurology | Admitting: Physical Therapy

## 2022-10-26 DIAGNOSIS — D1801 Hemangioma of skin and subcutaneous tissue: Secondary | ICD-10-CM | POA: Diagnosis not present

## 2022-10-26 DIAGNOSIS — D2261 Melanocytic nevi of right upper limb, including shoulder: Secondary | ICD-10-CM | POA: Diagnosis not present

## 2022-10-26 DIAGNOSIS — M5412 Radiculopathy, cervical region: Secondary | ICD-10-CM | POA: Insufficient documentation

## 2022-10-26 DIAGNOSIS — M6281 Muscle weakness (generalized): Secondary | ICD-10-CM | POA: Insufficient documentation

## 2022-10-26 DIAGNOSIS — D2262 Melanocytic nevi of left upper limb, including shoulder: Secondary | ICD-10-CM | POA: Diagnosis not present

## 2022-10-26 DIAGNOSIS — L850 Acquired ichthyosis: Secondary | ICD-10-CM | POA: Diagnosis not present

## 2022-10-26 DIAGNOSIS — L821 Other seborrheic keratosis: Secondary | ICD-10-CM | POA: Diagnosis not present

## 2022-10-26 DIAGNOSIS — L570 Actinic keratosis: Secondary | ICD-10-CM | POA: Diagnosis not present

## 2022-11-01 ENCOUNTER — Ambulatory Visit: Payer: Medicare Other | Admitting: Physical Therapy

## 2022-11-01 DIAGNOSIS — M6281 Muscle weakness (generalized): Secondary | ICD-10-CM

## 2022-11-01 DIAGNOSIS — M5412 Radiculopathy, cervical region: Secondary | ICD-10-CM

## 2022-11-01 NOTE — Therapy (Signed)
OUTPATIENT PHYSICAL THERAPY CERVICAL PROGRESS NOTE     Patient Name: Richard Lynch MRN: [id number] DOB:May 01, 1951, 71 y.o., male Today's Date: 08/18/2022  PT End of Session - 11/01/22 1106     Visit Number 12    Date for PT Re-Evaluation 12/06/22    Authorization Type Medicare    PT Start Time 1101    PT Stop Time 1142    PT Time Calculation (min) 41 min    Activity Tolerance Patient tolerated treatment well               Past Medical History:  Diagnosis Date   Atrial fibrillation ([place])    Constipation due to opioid therapy 12/22/2015   Emphysema lung ([place])    Gait abnormality 02/20/2017   GERD (gastroesophageal reflux disease)    High cholesterol    Hypertension    OSA (obstructive sleep apnea)    on CPAP   Primary localized osteoarthritis of right knee 12/08/2015   Septic olecranon bursitis 06/2015   MSSA   Past Surgical History:  Procedure Laterality Date   COLONOSCOPY W/ BIOPSIES AND POLYPECTOMY     ELBOW SURGERY  08/2015   bilateral   EYE SURGERY     'repair of tear to left eye from pliers'   HAND SURGERY Left 08/2018   Carpal Tunnel Surgery- [place]  07/2010   KNEE SURGERY     3 times on both knee's each   LEFT HEART CATH AND CORONARY ANGIOGRAPHY N/A 04/11/2022   Procedure: LEFT HEART CATH AND CORONARY ANGIOGRAPHY;  Surgeon: [name], MD;  Location: [facility];  Service: Cardiovascular;  Laterality: N/A;   MULTIPLE TOOTH EXTRACTIONS     SHOULDER ARTHROSCOPY     TOTAL HIP ARTHROPLASTY  08/2010   TOTAL KNEE ARTHROPLASTY Right 12/20/2015   TOTAL KNEE ARTHROPLASTY Right 12/20/2015   Procedure: TOTAL KNEE ARTHROPLASTY;  Surgeon: [name], MD;  Location: [facility];  Service: Orthopedics;  Laterality: Right;   Patient Active Problem List   Diagnosis Date Noted   Cigarette nicotine dependence without complication 36/14/4315   Coronary artery disease    Paroxysmal atrial fibrillation ([employer]) 12/24/2019   Secondary  hypercoagulable state ([address]) 12/24/2019   Dupuytren's contracture 07/02/2018   Spinal stenosis in cervical region 12/17/2017   Carpal tunnel syndrome of left wrist 11/27/2017   Radial nerve palsy, left 11/27/2017   Trigger ring finger of left hand 11/27/2017   Gait abnormality 02/20/2017   Restless leg syndrome 07/05/2016   Constipation due to opioid therapy 12/22/2015   Primary localized osteoarthritis of right knee 12/08/2015   Bullous emphysema ([place]) 11/02/2015   Primary hypertension 11/02/2015   Arthritis 11/02/2015   Hyperlipidemia 11/02/2015   OSA on CPAP 11/02/2015   GERD (gastroesophageal reflux disease) 11/02/2015   Septic olecranon bursitis 06/24/2015   Blepharochalasis 12/29/2014   Hidrocystoma of left eyelid 12/29/2014   Myogenic ptosis of eyelid of both eyes 12/29/2014   Multiple lung nodules on CT 06/30/2014   Combined senile cataract 12/24/2012   HSV epithelial keratitis 12/24/2012   Myopia with astigmatism and presbyopia 12/24/2012   Neurotrophic ulcer ([place]) 12/24/2012    PCP: [name] MD  REFERRING PROVIDER: [name] DO  REFERRING DIAG: cervical radiculopathy  THERAPY DIAG:  Cervical radiculopathy, weakness  Rationale for Evaluation and Treatment: Rehabilitation  ONSET DATE: 10/23/2021  SUBJECTIVE:  SUBJECTIVE STATEMENT: The neck pain has been good, it's just the weakness in these arms.  It's frustrating.   PERTINENT HISTORY:  Both feet numb c/o dec balance Injured neck 25 years ago;  Cervical fusion C5-T3 in 2019.  Currently ruptured disc affecting C8 but surgery too dangerous now, could make the problem worse. Sharp shooting pain in neck and bil UEs especially left UE.  Left hand dominant.  Both hands a lot weaker, drop things often.  I get tired of  shooting pains in arms and legs.  Can hold a pen 20 sec only.  Fatigue with hammering. Difficulty managing a 50# suitcase when traveling recently.  Patient reports my head feels heavy and hard to hold up.   2018-19:  C5-T3 posterior-lateral fusion with decompressive laminectomy from C5-6 to T1-2.  Per Dr. [name] office note: Chronic right hand weakness and paresthesias due to overlapping C8 radiculopathy and entrapment neuropathies. Exam is stable. 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 He has been off IVIG since December 2021.   He has sought second opinion at [facility], who agree that his hand paresthesias and weakness is mostly secondary to C8 radiculopathy with overlapping entrapment neuropathy.  He saw Neurosurgery at [facility] who advised that he is high risk for cervical surgery and recommended conservative therapies.  Management is supportive For pain, continue, continue gabapentin '1200mg'$  at bedtime.  For neck pain, restart tizanidine '2mg'$  BID as needed Start neck PT UPDATE 07/24/2022:  He feels that his hands are continuing to get weaker and often drops objects.  He denies any worsening numbness/tingling.  He also complains of chronic neck pain and stiffness.  He is not taking tizanidine, but does not recall why he stopped it.  Sometimes, he has shooting pain towards his head.  Balance is also poor.  No falls, walks unassisted.  Pain remains relatively controlled on gabapentin '1200mg'$  at bedtime.   HTN Bil Carpal tunnel releases 3 years ago had OT/PT and left elbow release HTN, A-fib, COPD PAIN:  PAIN:  Are you having pain? Yes NPRS scale: 3-4/10   Pain location: right neck Aggravating factors: reclined back;  after a lot of exertion (yardwork, playing golf) Relieving factors: lying down; sitting up straight   PRECAUTIONS: None "I can  do what I feel comfortable doing."   WEIGHT BEARING RESTRICTIONS: No  FALLS:  Has patient fallen in last 6 months? No but balance is horrible (outside in yard or golf course)   OCCUPATION: retired ; enjoys golf can do 18 holes   PLOF: Independent  PATIENT GOALS: better hand strength, help with neck pain after exertion  OBJECTIVE:   DIAGNOSTIC FINDINGS:  CT on 09/28/2021 IMPRESSION: 1. No acute displaced fracture or traumatic listhesis of the cervical spine in a patient with cervicothoracic posterolateral surgical hardware that is only partially visualized and originates at the C5 level. Persistent hardware lucency surrounding the and screws which is best evaluated on the coronal views and most prominent along the right C5 level. 2. Multilevel degenerative changes of the cervical spine with associated severe osseous neural foraminal stenosis of the non-surgerized levels. 3.  Emphysema (ICD10-J43.9).     NCS/EMG and US of the upper extremities 10/05/2021: There was electrodiagnostic and sonographic evidence of severe left median neuropathy at the wrist (carpal tunnel syndrome), left ulnar neuropathy with sonographic localization at the elbow as well  as a left subchronic C6-8 cervical radiculopathy. There was no electrodiagnostic evidence of a diffuse large fiber polyneuropathy. There appears to also be sonographic evidence consistent with right median neuropathy at the wrist, as may be seen in carpal tunnel syndrome. This was not fully explored on this study given the extensive nature of prior testing and clinical question asked, but could be explored further on a future study if clinically indicated. Clinical correlation advised.    NCS/EMG of the arms 04/01/2019 This is a complex study of the upper extremities.  Findings are as follows:  Bilateral median neuropathy at or distal to the wrist (severe), consistent with a clinical diagnosis of carpal tunnel syndrome.   Bilateral ulnar  neuropathy with slowing across the elbow, mild on the right and severe on the left. Chronic left posterior interosseous neuropathy, very severe.  Chronic C7-8 radiculopathy affecting bilateral upper extremities, moderate in degree electrically.   MRI cervical spine wo contrast 07/04/2018: 1. C5-T3 posterior-lateral fusion with decompressive laminectomy from C5-6 to T1-2. There is no arthrodesis by CT and the C5 and C6 lateral mass screws are loose. 2. Disc and facet degeneration causes multilevel foraminal impingement. Greatest foraminal impingement is seen on the left at C2-3, bilaterally at C3-4, right at C5-6, and right at T1-2. 3. Widely patent canal at levels of laminectomy. Noncompressive spinal stenosis seen at C3-4 and C4-5. 4. Septated fluid collection within the lower laminectomy defect without dural mass effect, best attributed to seroma.   MRI thoracic spine wo contrast 07/05/2018: Cervical and thoracic fusion and laminectomy. 15 mm fluid collection in the laminectomy bed. No cord compression or cord signal abnormality. Left paracentral disc protrusion T7-8 unchanged from the prior MRI PATIENT SURVEYS:  FOTO 54% 12/20: 54%  COGNITION: Overall cognitive status: Within functional limits for tasks assessed  SENSATION:  left 5th finger constantly numb, forearm 5-6 years bilaterally Noted atrophy left hypothenar eminence  POSTURE: rounded shoulders and forward head   Decreased cervical and flexor and extensor strength 4-/5 12/20:  cervical and flexor and extensor strength 4/5 CERVICAL ROM:   Active ROM A/PROM (deg) eval 11/15 12/20  Flexion 34 36 32  Extension 22 40 34  Right lateral flexion '25 31 30  '$ Left lateral flexion 20 43 38  Right rotation 40 55 55  Left rotation 20 50 50   (Blank rows = not tested)  UPPER EXTREMITY ROM:  grossly WFLS  UPPER EXTREMITY MMT:  MMT Right eval Left eval 11/22 12/20  Shoulder flexion 4+ 4- Left 4 4+  Shoulder extension       Shoulder abduction 4+ 4- Left 4 4+  Shoulder adduction      Shoulder extension      Shoulder internal rotation      Shoulder external rotation      Middle trapezius 4 4-  4  Lower trapezius 4 4-  4  Elbow flexion 5 4    Elbow extension 4 2+  4  Wrist flexion  2+  Left 3-  Wrist extension  2+  Left 3-  Wrist ulnar deviation      Wrist radial deviation      Wrist pronation      Wrist supination      Grip strength 33 24  25 left; 46 right    (Blank rows = not tested)  Pinch strength right 15#, left 5#  CERVICAL SPECIAL TESTS:  Upper limb tension test (ULTT): Negative and Distraction test: Positive  (seated gentle) No symptoms produced  or changed but neural tension bil   TODAY'S TREATMENT:   DATE: 11/01/22 Nu-Step 6 min L5 while discussing status Red ball on wall: chest on ball scap squeezes UE raise 15x 3# shoulder 3 ways 10x Green band with handle: diagonal flexion 10x right/left  Triceps 20# 2x10 5# dumbbell with red band attached for resisted pronation and supination left Finger web: finger flexion and extension, gross and individual  Green band with handle golf backswing simulation 15x left only Discussed medium Theraputty for home use Therapeutic activities:  gripping, twisting, lifting, pushing, pulling    DATE: 10/18/22  Nu-Step 6 min L5 while discussing status Green loop slide up wall lift offs at the top 10x 2# red plyo ball on wall clocks 10x right/left 7# snatch/press overhead 10x right/left  Triceps 20# 2x10 Flex bar eccentric wrist extension/flexion 10x right/left Finger web: finger flexion and extension, gross and individual  Therapeutic activities:   lifting, pushing, pulling  Manual therapy: soft tissue mobilization to bil cervical paraspinals, bil upper traps, bil suboccipitals, right levator scap Trigger Point Dry-Needling  Treatment instructions: Expect mild to moderate muscle soreness. S/S of pneumothorax if dry needled over a lung field, and to  seek immediate medical attention should they occur. Patient verbalized understanding of these instructions and education.  Patient Consent Given: Yes Education handout provided: Yes Muscles treated: right cervical multifidi, right upper traps, right levator scap Electrical stimulation performed: No Parameters: N/A Treatment response/outcome: improved soft tissue length       DATE: 10/11/22  UBE 7 min forward and backward Facing wall with red ball at chest "robbers" scapular squeeze and bil arm elevation 15x Triceps 20# 2x10 Velcro board left 3 tools 3 laps each Therapeutic activities:   lifting, pushing, pulling  Manual therapy: soft tissue mobilization to bil cervical paraspinals, bil upper traps, bil suboccipitals, right levator scap Trigger Point Dry-Needling  Treatment instructions: Expect mild to moderate muscle soreness. S/S of pneumothorax if dry needled over a lung field, and to seek immediate medical attention should they occur. Patient verbalized understanding of these instructions and education.  Patient Consent Given: Yes Education handout provided: Yes Muscles treated: bil cervical multifidi, bil upper traps,  levator scap; bil suboccipitals Electrical stimulation performed: No Parameters: N/A Treatment response/outcome: improved soft tissue length   PATIENT EDUCATION:  Education details: plan of care  Person educated: Patient Education method: Explanation Education comprehension: verbalized understanding  HOME EXERCISE PROGRAM: Access Code: [email or web address] URL: [email or web address] Date: 08/31/2022 Prepared by: Ruben Im  Exercises - Seated Upper Trapezius Stretch  - 1 x daily - 7 x weekly - 1 sets - 2-3 reps - 30 hold - Seated Cervical Flexion AROM  - 1 x daily - 7 x weekly - 1 sets - 2-3 reps - 30 hold - Seated Scapular Retraction  - 1 x daily - 7 x weekly - 1 sets - 10 reps - Supine Scapular Retraction  - 1 x daily - 7 x weekly - 1 sets -  10 reps - Seated Isometric Cervical Flexion  - 1 x daily - 7 x weekly - 1 sets - 5 reps - 5 hold - Seated Isometric Cervical Extension  - 1 x daily - 7 x weekly - 1 sets - 5 reps - 5 hold - Seated Isometric Cervical Sidebending  - 1 x daily - 7 x weekly - 1 sets - 5 reps - 5 hold - Seated Isometric Cervical Rotation  - 1 x daily - 7 x weekly - 1 sets -  5 reps - 5 hold - Median Nerve Flossing  - 1 x daily - 7 x weekly - 1 sets - 10 reps - Standing Row with Anchored Resistance  - 1 x daily - 7 x weekly - 3 sets - 10 reps - Shoulder Extension with Resistance Hands Down  - 1 x daily - 7 x weekly - 3 sets - 10 reps  ASSESSMENT:  CLINICAL IMPRESSION: Weakness in C7-T1 myotomes on left affecting fine motor coordination and gripping.  Proximal upper quarter strength is good.  Neck pain and ROM is much improved since start of care.  Therapist modifying ex's to encourage better form especially with left gripping tasks.      OBJECTIVE IMPAIRMENTS: decreased activity tolerance, impaired perceived functional ability, impaired flexibility, impaired sensation, impaired UE functional use, postural dysfunction, and pain.   ACTIVITY LIMITATIONS: carrying, lifting, sitting, standing, and reach over head  PARTICIPATION LIMITATIONS: interpersonal relationship, community activity, and yard work  PERSONAL FACTORS: Past/current experiences, Time since onset of injury/illness/exacerbation, and 3+ comorbidities: multi regions affected, HTN, A-fib, COPD  are also affecting patient's functional outcome.   REHAB POTENTIAL: Good  CLINICAL DECISION MAKING: Evolving/moderate complexity  EVALUATION COMPLEXITY: Moderate   GOALS: Goals reviewed with patient? Yes  SHORT TERM GOALS: Target date: 09/15/2022   The patient will demonstrate knowledge of basic self care strategies and exercises to promote healing   Baseline:  Goal status: goal met 11/22 2.  The patient will report a 30% improvement in pain levels with  functional activities   Baseline:  Goal status: ongoing  3.  Cervical flexors and extensors improved to 4/5 for improved tolerance with reclined sitting Baseline:  Goal status: goal met 11/22  4.  Shoulder/scapular strength improved to 4/5 needed for lifting/carrying medium objects for yardwork and traveling Baseline:  Goal status: goal met 11/22    LONG TERM GOALS: Target date: 12/06/2022  The patient will be independent in a safe self progression of a home exercise program to promote further recovery of function   Baseline:  Goal status: ongoing  2.  The patient will report a 60% improvement in pain levels with functional activities  Baseline:   30% on 12/20 Goal status: ongoing 3.  Cervical flexors and extensors improved to 4+/5 for improved tolerance with reclined sitting, playing golf Baseline:  Goal status: ongoing  4.  Shoulder/scapular strength improved to 4+/5 needed for lifting/carrying medium to heavier objects for yardwork and suitcases for traveling Baseline:  Goal status: partially met 5.  The patient will have improved FOTO score to   59%    indicating improved function with less pain  Baseline:  Goal status: ongoing  6. Improved left grip strength to 27# for holding a fork and cutting things  NEW   PLAN:  PT FREQUENCY: 1-2x/week  PT DURATION: 8 weeks  PLANNED INTERVENTIONS: Therapeutic exercises, Therapeutic activity, Neuromuscular re-education, Balance training, Gait training, Patient/Family education, Self Care, Joint mobilization, Aquatic Therapy, Dry Needling, Spinal mobilization, Cryotherapy, Moist heat, Taping, Traction, Ultrasound, Manual therapy, and Re-evaluation  PLAN FOR NEXT SESSION:  green band with handle golf swing;  distal left UE strengthening especially left;  UBE and lat bar, counter push ups or quadruped; proximal UE strengthening,  DN as needed; give theraputty HEP if pt gets for home use   Ruben Im, PT 11/01/22 11:54  AM Phone: [phone] Fax: [phone]

## 2022-11-08 ENCOUNTER — Ambulatory Visit: Payer: Medicare Other | Admitting: Physical Therapy

## 2022-11-08 DIAGNOSIS — M6281 Muscle weakness (generalized): Secondary | ICD-10-CM | POA: Diagnosis not present

## 2022-11-08 DIAGNOSIS — M5412 Radiculopathy, cervical region: Secondary | ICD-10-CM

## 2022-11-08 NOTE — Therapy (Signed)
OUTPATIENT PHYSICAL THERAPY CERVICAL PROGRESS NOTE     Patient Name: Richard Lynch MRN: [id number] DOB:May 24, 1951, 72 y.o., male Today's Date: 08/18/2022  PT End of Session - 11/08/22 1014     Visit Number 13    Date for PT Re-Evaluation 12/06/22    Authorization Type Medicare    PT Start Time 1015    PT Stop Time 1055    PT Time Calculation (min) 40 min    Activity Tolerance Patient tolerated treatment well               Past Medical History:  Diagnosis Date   Atrial fibrillation ([place])    Constipation due to opioid therapy 12/22/2015   Emphysema lung ([place])    Gait abnormality 02/20/2017   GERD (gastroesophageal reflux disease)    High cholesterol    Hypertension    OSA (obstructive sleep apnea)    on CPAP   Primary localized osteoarthritis of right knee 12/08/2015   Septic olecranon bursitis 06/2015   MSSA   Past Surgical History:  Procedure Laterality Date   COLONOSCOPY W/ BIOPSIES AND POLYPECTOMY     ELBOW SURGERY  08/2015   bilateral   EYE SURGERY     'repair of tear to left eye from pliers'   HAND SURGERY Left 08/2018   Carpal Tunnel Surgery- [place]  07/2010   KNEE SURGERY     3 times on both knee's each   LEFT HEART CATH AND CORONARY ANGIOGRAPHY N/A 04/11/2022   Procedure: LEFT HEART CATH AND CORONARY ANGIOGRAPHY;  Surgeon: [name], MD;  Location: [facility];  Service: Cardiovascular;  Laterality: N/A;   MULTIPLE TOOTH EXTRACTIONS     SHOULDER ARTHROSCOPY     TOTAL HIP ARTHROPLASTY  08/2010   TOTAL KNEE ARTHROPLASTY Right 12/20/2015   TOTAL KNEE ARTHROPLASTY Right 12/20/2015   Procedure: TOTAL KNEE ARTHROPLASTY;  Surgeon: [name], MD;  Location: [facility];  Service: Orthopedics;  Laterality: Right;   Patient Active Problem List   Diagnosis Date Noted   Cigarette nicotine dependence without complication 76/22/6333   Coronary artery disease    Paroxysmal atrial fibrillation ([place]) 12/24/2019   Secondary  hypercoagulable state ([place]) 12/24/2019   Dupuytren's contracture 07/02/2018   Spinal stenosis in cervical region 12/17/2017   Carpal tunnel syndrome of left wrist 11/27/2017   Radial nerve palsy, left 11/27/2017   Trigger ring finger of left hand 11/27/2017   Gait abnormality 02/20/2017   Restless leg syndrome 07/05/2016   Constipation due to opioid therapy 12/22/2015   Primary localized osteoarthritis of right knee 12/08/2015   Bullous emphysema ([place]) 11/02/2015   Primary hypertension 11/02/2015   Arthritis 11/02/2015   Hyperlipidemia 11/02/2015   OSA on CPAP 11/02/2015   GERD (gastroesophageal reflux disease) 11/02/2015   Septic olecranon bursitis 06/24/2015   Blepharochalasis 12/29/2014   Hidrocystoma of left eyelid 12/29/2014   Myogenic ptosis of eyelid of both eyes 12/29/2014   Multiple lung nodules on CT 06/30/2014   Combined senile cataract 12/24/2012   HSV epithelial keratitis 12/24/2012   Myopia with astigmatism and presbyopia 12/24/2012   Neurotrophic ulcer ([place]) 12/24/2012    PCP: [name] MD  REFERRING PROVIDER: [name] DO  REFERRING DIAG: cervical radiculopathy  THERAPY DIAG:  Cervical radiculopathy, weakness  Rationale for Evaluation and Treatment: Rehabilitation  ONSET DATE: 10/23/2021  SUBJECTIVE:  SUBJECTIVE STATEMENT: Soreness but not really painful.  Weakness is my arms is my main problem.  PERTINENT HISTORY:  Both feet numb c/o dec balance Injured neck 25 years ago;  Cervical fusion C5-T3 in 2019.  Currently ruptured disc affecting C8 but surgery too dangerous now, could make the problem worse. Sharp shooting pain in neck and bil UEs especially left UE.  Left hand dominant.  Both hands a lot weaker, drop things often.  I get tired of shooting  pains in arms and legs.  Can hold a pen 20 sec only.  Fatigue with hammering. Difficulty managing a 50# suitcase when traveling recently.  Patient reports my head feels heavy and hard to hold up.   2018-19:  C5-T3 posterior-lateral fusion with decompressive laminectomy from C5-6 to T1-2.  Per Dr. [name] office note: Chronic right hand weakness and paresthesias due to overlapping C8 radiculopathy and entrapment neuropathies. Exam is stable. 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 He has been off IVIG since December 2021.   He has sought second opinion at [facility], who agree that his hand paresthesias and weakness is mostly secondary to C8 radiculopathy with overlapping entrapment neuropathy.  He saw Neurosurgery at [facility] who advised that he is high risk for cervical surgery and recommended conservative therapies.  Management is supportive For pain, continue, continue gabapentin '1200mg'$  at bedtime.  For neck pain, restart tizanidine '2mg'$  BID as needed Start neck PT UPDATE 07/24/2022:  He feels that his hands are continuing to get weaker and often drops objects.  He denies any worsening numbness/tingling.  He also complains of chronic neck pain and stiffness.  He is not taking tizanidine, but does not recall why he stopped it.  Sometimes, he has shooting pain towards his head.  Balance is also poor.  No falls, walks unassisted.  Pain remains relatively controlled on gabapentin '1200mg'$  at bedtime.   HTN Bil Carpal tunnel releases 3 years ago had OT/PT and left elbow release HTN, A-fib, COPD PAIN:  PAIN:  Are you having pain? No, soreness NPRS scale: 0 Pain location: right neck Aggravating factors: reclined back;  after a lot of exertion (yardwork, playing golf) Relieving factors: lying down; sitting up straight   PRECAUTIONS: None "I can do what  I feel comfortable doing."   WEIGHT BEARING RESTRICTIONS: No  FALLS:  Has patient fallen in last 6 months? No but balance is horrible (outside in yard or golf course)   OCCUPATION: retired ; enjoys golf can do 18 holes   PLOF: Independent  PATIENT GOALS: better hand strength, help with neck pain after exertion  OBJECTIVE:   DIAGNOSTIC FINDINGS:  CT on 09/28/2021 IMPRESSION: 1. No acute displaced fracture or traumatic listhesis of the cervical spine in a patient with cervicothoracic posterolateral surgical hardware that is only partially visualized and originates at the C5 level. Persistent hardware lucency surrounding the and screws which is best evaluated on the coronal views and most prominent along the right C5 level. 2. Multilevel degenerative changes of the cervical spine with associated severe osseous neural foraminal stenosis of the non-surgerized levels. 3.  Emphysema (ICD10-J43.9).     NCS/EMG and US of the upper extremities 10/05/2021: There was electrodiagnostic and sonographic evidence of severe left median neuropathy at the wrist (carpal tunnel syndrome), left ulnar neuropathy with sonographic localization at the elbow as well as a left subchronic  C6-8 cervical radiculopathy. There was no electrodiagnostic evidence of a diffuse large fiber polyneuropathy. There appears to also be sonographic evidence consistent with right median neuropathy at the wrist, as may be seen in carpal tunnel syndrome. This was not fully explored on this study given the extensive nature of prior testing and clinical question asked, but could be explored further on a future study if clinically indicated. Clinical correlation advised.    NCS/EMG of the arms 04/01/2019 This is a complex study of the upper extremities.  Findings are as follows:  Bilateral median neuropathy at or distal to the wrist (severe), consistent with a clinical diagnosis of carpal tunnel syndrome.   Bilateral ulnar  neuropathy with slowing across the elbow, mild on the right and severe on the left. Chronic left posterior interosseous neuropathy, very severe.  Chronic C7-8 radiculopathy affecting bilateral upper extremities, moderate in degree electrically.   MRI cervical spine wo contrast 07/04/2018: 1. C5-T3 posterior-lateral fusion with decompressive laminectomy from C5-6 to T1-2. There is no arthrodesis by CT and the C5 and C6 lateral mass screws are loose. 2. Disc and facet degeneration causes multilevel foraminal impingement. Greatest foraminal impingement is seen on the left at C2-3, bilaterally at C3-4, right at C5-6, and right at T1-2. 3. Widely patent canal at levels of laminectomy. Noncompressive spinal stenosis seen at C3-4 and C4-5. 4. Septated fluid collection within the lower laminectomy defect without dural mass effect, best attributed to seroma.   MRI thoracic spine wo contrast 07/05/2018: Cervical and thoracic fusion and laminectomy. 15 mm fluid collection in the laminectomy bed. No cord compression or cord signal abnormality. Left paracentral disc protrusion T7-8 unchanged from the prior MRI PATIENT SURVEYS:  FOTO 54% 12/20: 54%  COGNITION: Overall cognitive status: Within functional limits for tasks assessed  SENSATION:  left 5th finger constantly numb, forearm 5-6 years bilaterally Noted atrophy left hypothenar eminence  POSTURE: rounded shoulders and forward head   Decreased cervical and flexor and extensor strength 4-/5 12/20:  cervical and flexor and extensor strength 4/5 CERVICAL ROM:   Active ROM A/PROM (deg) eval 11/15 12/20 1/17  Flexion 34 36 32 35  Extension 22 40 34 35  Right lateral flexion '25 31 30 30  '$ Left lateral flexion 20 43 38 40  Right rotation 40 55 55   Left rotation 20 50 50    (Blank rows = not tested)  UPPER EXTREMITY ROM:  grossly WFLS  UPPER EXTREMITY MMT:  MMT Right eval Left eval 11/22 12/20  Shoulder flexion 4+ 4- Left 4 4+  Shoulder  extension      Shoulder abduction 4+ 4- Left 4 4+  Shoulder adduction      Shoulder extension      Shoulder internal rotation      Shoulder external rotation      Middle trapezius 4 4-  4  Lower trapezius 4 4-  4  Elbow flexion 5 4    Elbow extension 4 2+  4  Wrist flexion  2+  Left 3-  Wrist extension  2+  Left 3-  Wrist ulnar deviation      Wrist radial deviation      Wrist pronation      Wrist supination      Grip strength 33 24  25 left; 46 right    (Blank rows = not tested)  Pinch strength right 15#, left 5#  CERVICAL SPECIAL TESTS:  Upper limb tension test (ULTT): Negative and Distraction test: Positive  (seated gentle)  No symptoms produced or changed but neural tension bil   TODAY'S TREATMENT:   DATE: 11/08/22 UBE 5 min forward/backward while discussing status Red ball on wall: chest on ball scap squeezes UE raise 15x Lat bar 40# 20x 3# shoulder 3 ways 10x Green band with handle: diagonal flexion 10x right/left  Triceps 20# 2x10 5# dumbbell with red band attached for resisted pronation and supination left/right 2x10  Standing with hands on mat table with towel roll under heel of hands: weight shifting front/back to encourage wrist extension (right stiffer than left) medium Theraputty ex's per HEP Therapeutic activities:  gripping, twisting, lifting, pushing, pulling        DATE: 11/01/22 Nu-Step 6 min L5 while discussing status Red ball on wall: chest on ball scap squeezes UE raise 15x 3# shoulder 3 ways 10x Green band with handle: diagonal flexion 10x right/left  Triceps 20# 2x10 5# dumbbell with red band attached for resisted pronation and supination left Finger web: finger flexion and extension, gross and individual  Green band with handle golf backswing simulation 15x left only Discussed medium Theraputty for home use Therapeutic activities:  gripping, twisting, lifting, pushing, pulling    DATE: 10/18/22  Nu-Step 6 min L5 while discussing  status Green loop slide up wall lift offs at the top 10x 2# red plyo ball on wall clocks 10x right/left 7# snatch/press overhead 10x right/left  Triceps 20# 2x10 Flex bar eccentric wrist extension/flexion 10x right/left Finger web: finger flexion and extension, gross and individual  Therapeutic activities:   lifting, pushing, pulling  Manual therapy: soft tissue mobilization to bil cervical paraspinals, bil upper traps, bil suboccipitals, right levator scap Trigger Point Dry-Needling  Treatment instructions: Expect mild to moderate muscle soreness. S/S of pneumothorax if dry needled over a lung field, and to seek immediate medical attention should they occur. Patient verbalized understanding of these instructions and education.  Patient Consent Given: Yes Education handout provided: Yes Muscles treated: right cervical multifidi, right upper traps, right levator scap Electrical stimulation performed: No Parameters: N/A Treatment response/outcome: improved soft tissue length       DATE: 10/11/22  UBE 7 min forward and backward Facing wall with red ball at chest "robbers" scapular squeeze and bil arm elevation 15x Triceps 20# 2x10 Velcro board left 3 tools 3 laps each Therapeutic activities:   lifting, pushing, pulling  Manual therapy: soft tissue mobilization to bil cervical paraspinals, bil upper traps, bil suboccipitals, right levator scap Trigger Point Dry-Needling  Treatment instructions: Expect mild to moderate muscle soreness. S/S of pneumothorax if dry needled over a lung field, and to seek immediate medical attention should they occur. Patient verbalized understanding of these instructions and education.  Patient Consent Given: Yes Education handout provided: Yes Muscles treated: bil cervical multifidi, bil upper traps,  levator scap; bil suboccipitals Electrical stimulation performed: No Parameters: N/A Treatment response/outcome: improved soft tissue length   PATIENT  EDUCATION:  Education details: plan of care  Person educated: Patient Education method: Explanation Education comprehension: verbalized understanding  HOME EXERCISE PROGRAM: Access Code: [email or web address] URL: [email or web address] Date: 11/08/2022 Prepared by: Ruben Im  Exercises - Seated Upper Trapezius Stretch  - 1 x daily - 7 x weekly - 1 sets - 2-3 reps - 30 hold - Seated Cervical Flexion AROM  - 1 x daily - 7 x weekly - 1 sets - 2-3 reps - 30 hold - Seated Scapular Retraction  - 1 x daily - 7 x weekly - 1 sets - 10 reps - Supine  Scapular Retraction  - 1 x daily - 7 x weekly - 1 sets - 10 reps - Seated Isometric Cervical Flexion  - 1 x daily - 7 x weekly - 1 sets - 5 reps - 5 hold - Seated Isometric Cervical Extension  - 1 x daily - 7 x weekly - 1 sets - 5 reps - 5 hold - Seated Isometric Cervical Sidebending  - 1 x daily - 7 x weekly - 1 sets - 5 reps - 5 hold - Seated Isometric Cervical Rotation  - 1 x daily - 7 x weekly - 1 sets - 5 reps - 5 hold - Median Nerve Flossing  - 1 x daily - 7 x weekly - 1 sets - 10 reps - Standing Row with Anchored Resistance  - 1 x daily - 7 x weekly - 3 sets - 10 reps - Shoulder Extension with Resistance Hands Down  - 1 x daily - 7 x weekly - 3 sets - 10 reps - Putty Squeezes  - 1 x daily - 7 x weekly - 3 sets - 10 reps - Tip Pinch with Putty  - 1 x daily - 7 x weekly - 3 sets - 10 reps - Key Pinch with Putty  - 1 x daily - 7 x weekly - 3 sets - 10 reps - 3-Point Pinch with Putty  - 1 x daily - 7 x weekly - 1 sets - 10 reps - Finger Extension with Putty  - 1 x daily - 7 x weekly - 1 sets - 10 reps - Finger Lumbricals with Putty  - 1 x daily - 7 x weekly - 1 sets - 10 reps - Thumb Opposition with Putty  - 1 x daily - 7 x weekly - 1 sets - 10 reps - Finger Pinch and Pull with Putty  - 1 x daily - 7 x weekly - 1 sets - 10 reps - Seated Finger Composite Flexion with Putty  - 1 x daily - 7 x weekly - 1 sets - 10 reps - Seated Claw Fist with  Putty  - 1 x daily - 7 x weekly - 1 sets - 10 reps - Seated Finger MP Flexion with Putty  - 1 x daily - 7 x weekly - 1 sets - 10 reps - Quadruped Forward Weight Shift  - 1 x daily - 7 x weekly - 1 sets - 10 reps - Quadruped Side to Side Weight Shifts  - 1 x daily - 7 x weekly - 1 sets - 10 reps - Quadruped Circle Weight Shifts  - 1 x daily - 7 x weekly - 1 sets - 10 reps  ASSESSMENT:  CLINICAL IMPRESSION: Therapist progressing and updating HEP for increased intensity and challenge level for further strengthening and functional mobility particularly for hand and wrists.  Limited right > left wrist extension but weaker hand instrinsic muscles left > right.  Discussed modifications including a towel roll under the heel of the hand to accommodate for stiffness initially.      OBJECTIVE IMPAIRMENTS: decreased activity tolerance, impaired perceived functional ability, impaired flexibility, impaired sensation, impaired UE functional use, postural dysfunction, and pain.   ACTIVITY LIMITATIONS: carrying, lifting, sitting, standing, and reach over head  PARTICIPATION LIMITATIONS: interpersonal relationship, community activity, and yard work  PERSONAL FACTORS: Past/current experiences, Time since onset of injury/illness/exacerbation, and 3+ comorbidities: multi regions affected, HTN, A-fib, COPD  are also affecting patient's functional outcome.   REHAB POTENTIAL: Good  CLINICAL  DECISION MAKING: Evolving/moderate complexity  EVALUATION COMPLEXITY: Moderate   GOALS: Goals reviewed with patient? Yes  SHORT TERM GOALS: Target date: 09/15/2022   The patient will demonstrate knowledge of basic self care strategies and exercises to promote healing   Baseline:  Goal status: goal met 11/22 2.  The patient will report a 30% improvement in pain levels with functional activities   Baseline:  Goal status: ongoing  3.  Cervical flexors and extensors improved to 4/5 for improved tolerance with reclined  sitting Baseline:  Goal status: goal met 11/22  4.  Shoulder/scapular strength improved to 4/5 needed for lifting/carrying medium objects for yardwork and traveling Baseline:  Goal status: goal met 11/22    LONG TERM GOALS: Target date: 12/06/2022  The patient will be independent in a safe self progression of a home exercise program to promote further recovery of function   Baseline:  Goal status: ongoing  2.  The patient will report a 60% improvement in pain levels with functional activities  Baseline:   30% on 12/20 Goal status: ongoing 3.  Cervical flexors and extensors improved to 4+/5 for improved tolerance with reclined sitting, playing golf Baseline:  Goal status: ongoing  4.  Shoulder/scapular strength improved to 4+/5 needed for lifting/carrying medium to heavier objects for yardwork and suitcases for traveling Baseline:  Goal status: partially met 5.  The patient will have improved FOTO score to   59%    indicating improved function with less pain  Baseline:  Goal status: ongoing  6. Improved left grip strength to 27# for holding a fork and cutting things  NEW   PLAN:  PT FREQUENCY: 1-2x/week  PT DURATION: 8 weeks  PLANNED INTERVENTIONS: Therapeutic exercises, Therapeutic activity, Neuromuscular re-education, Balance training, Gait training, Patient/Family education, Self Care, Joint mobilization, Aquatic Therapy, Dry Needling, Spinal mobilization, Cryotherapy, Moist heat, Taping, Traction, Ultrasound, Manual therapy, and Re-evaluation  PLAN FOR NEXT SESSION: recheck grip strength;  UE weight bearing;   green band with handle golf swing;  distal left UE strengthening especially left;  UBE and lat bar, counter push ups or quadruped; proximal UE strengthening,  DN as needed  Ruben Im, PT 11/08/22 11:12 AM Phone: [phone] Fax: [phone]

## 2022-11-15 ENCOUNTER — Ambulatory Visit: Payer: Medicare Other | Admitting: Physical Therapy

## 2022-11-15 DIAGNOSIS — M5412 Radiculopathy, cervical region: Secondary | ICD-10-CM

## 2022-11-15 DIAGNOSIS — M6281 Muscle weakness (generalized): Secondary | ICD-10-CM | POA: Diagnosis not present

## 2022-11-15 NOTE — Therapy (Signed)
OUTPATIENT PHYSICAL THERAPY CERVICAL PROGRESS NOTE     Patient Name: Richard Lynch MRN: [id number] DOB:Dec 26, 1950, 72 y.o., male Today's Date: 08/18/2022  PT End of Session - 11/15/22 1126     Visit Number 14    Date for PT Re-Evaluation 12/06/22    Authorization Type Medicare    PT Start Time 1103    PT Stop Time 1145    PT Time Calculation (min) 42 min    Activity Tolerance Patient tolerated treatment well               Past Medical History:  Diagnosis Date   Atrial fibrillation ([place])    Constipation due to opioid therapy 12/22/2015   Emphysema lung ([place])    Gait abnormality 02/20/2017   GERD (gastroesophageal reflux disease)    High cholesterol    Hypertension    OSA (obstructive sleep apnea)    on CPAP   Primary localized osteoarthritis of right knee 12/08/2015   Septic olecranon bursitis 06/2015   MSSA   Past Surgical History:  Procedure Laterality Date   COLONOSCOPY W/ BIOPSIES AND POLYPECTOMY     ELBOW SURGERY  08/2015   bilateral   EYE SURGERY     'repair of tear to left eye from pliers'   HAND SURGERY Left 08/2018   Carpal Tunnel Surgery- [facility]  07/2010   KNEE SURGERY     3 times on both knee's each   LEFT HEART CATH AND CORONARY ANGIOGRAPHY N/A 04/11/2022   Procedure: LEFT HEART CATH AND CORONARY ANGIOGRAPHY;  Surgeon: [name], MD;  Location: [facility];  Service: Cardiovascular;  Laterality: N/A;   MULTIPLE TOOTH EXTRACTIONS     SHOULDER ARTHROSCOPY     TOTAL HIP ARTHROPLASTY  08/2010   TOTAL KNEE ARTHROPLASTY Right 12/20/2015   TOTAL KNEE ARTHROPLASTY Right 12/20/2015   Procedure: TOTAL KNEE ARTHROPLASTY;  Surgeon: [name], MD;  Location: [facility];  Service: Orthopedics;  Laterality: Right;   Patient Active Problem List   Diagnosis Date Noted   Cigarette nicotine dependence without complication [id number]   Coronary artery disease    Paroxysmal atrial fibrillation ([place]) 12/24/2019   Secondary  hypercoagulable state ([place]) 12/24/2019   Dupuytren's contracture 07/02/2018   Spinal stenosis in cervical region 12/17/2017   Carpal tunnel syndrome of left wrist 11/27/2017   Radial nerve palsy, left 11/27/2017   Trigger ring finger of left hand 11/27/2017   Gait abnormality 02/20/2017   Restless leg syndrome 07/05/2016   Constipation due to opioid therapy 12/22/2015   Primary localized osteoarthritis of right knee 12/08/2015   Bullous emphysema ([place]) 11/02/2015   Primary hypertension 11/02/2015   Arthritis 11/02/2015   Hyperlipidemia 11/02/2015   OSA on CPAP 11/02/2015   GERD (gastroesophageal reflux disease) 11/02/2015   Septic olecranon bursitis 06/24/2015   Blepharochalasis 12/29/2014   Hidrocystoma of left eyelid 12/29/2014   Myogenic ptosis of eyelid of both eyes 12/29/2014   Multiple lung nodules on CT 06/30/2014   Combined senile cataract 12/24/2012   HSV epithelial keratitis 12/24/2012   Myopia with astigmatism and presbyopia 12/24/2012   Neurotrophic ulcer ([place]) 12/24/2012    PCP: [name] MD  REFERRING PROVIDER: [name] DO  REFERRING DIAG: cervical radiculopathy  THERAPY DIAG:  Cervical radiculopathy, weakness  Rationale for Evaluation and Treatment: Rehabilitation  ONSET DATE: 10/23/2021  SUBJECTIVE:  SUBJECTIVE STATEMENT: I had trouble opening a small cap on a bottle, it was embarrassing.   PERTINENT HISTORY:  Both feet numb c/o dec balance Injured neck 25 years ago;  Cervical fusion C5-T3 in 2019.  Currently ruptured disc affecting C8 but surgery too dangerous now, could make the problem worse. Sharp shooting pain in neck and bil UEs especially left UE.  Left hand dominant.  Both hands a lot weaker, drop things often.  I get tired of shooting pains in  arms and legs.  Can hold a pen 20 sec only.  Fatigue with hammering. Difficulty managing a 50# suitcase when traveling recently.  Patient reports my head feels heavy and hard to hold up.   2018-19:  C5-T3 posterior-lateral fusion with decompressive laminectomy from C5-6 to T1-2.  Per Dr. [name] office note: Chronic right hand weakness and paresthesias due to overlapping C8 radiculopathy and entrapment neuropathies. Exam is stable. 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 He has been off IVIG since December 2021.   He has sought second opinion at [facility], who agree that his hand paresthesias and weakness is mostly secondary to C8 radiculopathy with overlapping entrapment neuropathy.  He saw Neurosurgery at [facility] who advised that he is high risk for cervical surgery and recommended conservative therapies.  Management is supportive For pain, continue, continue gabapentin '1200mg'$  at bedtime.  For neck pain, restart tizanidine '2mg'$  BID as needed Start neck PT UPDATE 07/24/2022:  He feels that his hands are continuing to get weaker and often drops objects.  He denies any worsening numbness/tingling.  He also complains of chronic neck pain and stiffness.  He is not taking tizanidine, but does not recall why he stopped it.  Sometimes, he has shooting pain towards his head.  Balance is also poor.  No falls, walks unassisted.  Pain remains relatively controlled on gabapentin '1200mg'$  at bedtime.   HTN Bil Carpal tunnel releases 3 years ago had OT/PT and left elbow release HTN, A-fib, COPD PAIN:  PAIN:  Are you having pain? No, soreness NPRS scale: 0 Pain location: right neck Aggravating factors: reclined back;  after a lot of exertion (yardwork, playing golf) Relieving factors: lying down; sitting up straight   PRECAUTIONS: None "I can do what I feel  comfortable doing."   WEIGHT BEARING RESTRICTIONS: No  FALLS:  Has patient fallen in last 6 months? No but balance is horrible (outside in yard or golf course)   OCCUPATION: retired ; enjoys golf can do 18 holes   PLOF: Independent  PATIENT GOALS: better hand strength, help with neck pain after exertion  OBJECTIVE:   DIAGNOSTIC FINDINGS:  CT on 09/28/2021 IMPRESSION: 1. No acute displaced fracture or traumatic listhesis of the cervical spine in a patient with cervicothoracic posterolateral surgical hardware that is only partially visualized and originates at the C5 level. Persistent hardware lucency surrounding the and screws which is best evaluated on the coronal views and most prominent along the right C5 level. 2. Multilevel degenerative changes of the cervical spine with associated severe osseous neural foraminal stenosis of the non-surgerized levels. 3.  Emphysema (ICD10-J43.9).     NCS/EMG and US of the upper extremities 10/05/2021: There was electrodiagnostic and sonographic evidence of severe left median neuropathy at the wrist (carpal tunnel syndrome), left ulnar neuropathy with sonographic localization at the elbow as well as a left subchronic  C6-8 cervical radiculopathy. There was no electrodiagnostic evidence of a diffuse large fiber polyneuropathy. There appears to also be sonographic evidence consistent with right median neuropathy at the wrist, as may be seen in carpal tunnel syndrome. This was not fully explored on this study given the extensive nature of prior testing and clinical question asked, but could be explored further on a future study if clinically indicated. Clinical correlation advised.    NCS/EMG of the arms 04/01/2019 This is a complex study of the upper extremities.  Findings are as follows:  Bilateral median neuropathy at or distal to the wrist (severe), consistent with a clinical diagnosis of carpal tunnel syndrome.   Bilateral ulnar neuropathy with  slowing across the elbow, mild on the right and severe on the left. Chronic left posterior interosseous neuropathy, very severe.  Chronic C7-8 radiculopathy affecting bilateral upper extremities, moderate in degree electrically.   MRI cervical spine wo contrast 07/04/2018: 1. C5-T3 posterior-lateral fusion with decompressive laminectomy from C5-6 to T1-2. There is no arthrodesis by CT and the C5 and C6 lateral mass screws are loose. 2. Disc and facet degeneration causes multilevel foraminal impingement. Greatest foraminal impingement is seen on the left at C2-3, bilaterally at C3-4, right at C5-6, and right at T1-2. 3. Widely patent canal at levels of laminectomy. Noncompressive spinal stenosis seen at C3-4 and C4-5. 4. Septated fluid collection within the lower laminectomy defect without dural mass effect, best attributed to seroma.   MRI thoracic spine wo contrast 07/05/2018: Cervical and thoracic fusion and laminectomy. 15 mm fluid collection in the laminectomy bed. No cord compression or cord signal abnormality. Left paracentral disc protrusion T7-8 unchanged from the prior MRI PATIENT SURVEYS:  FOTO 54% 12/20: 54%  COGNITION: Overall cognitive status: Within functional limits for tasks assessed  SENSATION:  left 5th finger constantly numb, forearm 5-6 years bilaterally Noted atrophy left hypothenar eminence  POSTURE: rounded shoulders and forward head   Decreased cervical and flexor and extensor strength 4-/5 12/20:  cervical and flexor and extensor strength 4/5 CERVICAL ROM:   Active ROM A/PROM (deg) eval 11/15 12/20 1/17  Flexion 34 36 32 35  Extension 22 40 34 35  Right lateral flexion '25 31 30 30  '$ Left lateral flexion 20 43 38 40  Right rotation 40 55 55   Left rotation 20 50 50    (Blank rows = not tested)  UPPER EXTREMITY ROM:  grossly WFLS  UPPER EXTREMITY MMT:  MMT Right eval Left eval 11/22 12/20  Shoulder flexion 4+ 4- Left 4 4+  Shoulder extension       Shoulder abduction 4+ 4- Left 4 4+  Shoulder adduction      Shoulder extension      Shoulder internal rotation      Shoulder external rotation      Middle trapezius 4 4-  4  Lower trapezius 4 4-  4  Elbow flexion 5 4    Elbow extension 4 2+  4  Wrist flexion  2+  Left 3-  Wrist extension  2+  Left 3-  Wrist ulnar deviation      Wrist radial deviation      Wrist pronation      Wrist supination      Grip strength 33 24  25 left; 46 right    (Blank rows = not tested)  Pinch strength right 15#, left 5#  CERVICAL SPECIAL TESTS:  Upper limb tension test (ULTT): Negative and Distraction test: Positive  (seated gentle)  No symptoms produced or changed but neural tension bil   TODAY'S TREATMENT:   DATE: 11/15/22 UBE 5 min forward/backward while discussing status Lean on hands on wall with elbow taps 20x Seated 5# plate weight drop and catch 15x easily on right;  left side modified to a drop to floor and re-grip 15x Lat bar 40# 20x Green band with handle: diagonal flexion 10x right/left  Triceps 20# 2x10 Digiflex 5# and 3# 20x right/left  5# dumbbell with red band attached for resisted pronation and supination left/right 2x10  Therapeutic activities:  gripping, twisting, lifting, pushing, pulling       DATE: 11/08/22 UBE 5 min forward/backward while discussing status Red ball on wall: chest on ball scap squeezes UE raise 15x Lat bar 40# 20x 3# shoulder 3 ways 10x Green band with handle: diagonal flexion 10x right/left  Triceps 20# 2x10 5# dumbbell with red band attached for resisted pronation and supination left/right 2x10  Standing with hands on mat table with towel roll under heel of hands: weight shifting front/back to encourage wrist extension (right stiffer than left) medium Theraputty ex's per HEP Therapeutic activities:  gripping, twisting, lifting, pushing, pulling        DATE: 11/01/22 Nu-Step 6 min L5 while discussing status Red ball on wall: chest on ball  scap squeezes UE raise 15x 3# shoulder 3 ways 10x Green band with handle: diagonal flexion 10x right/left  Triceps 20# 2x10 5# dumbbell with red band attached for resisted pronation and supination left Finger web: finger flexion and extension, gross and individual  Green band with handle golf backswing simulation 15x left only Discussed medium Theraputty for home use Therapeutic activities:  gripping, twisting, lifting, pushing, pulling     PATIENT EDUCATION:  Education details: plan of care  Person educated: Patient Education method: Explanation Education comprehension: verbalized understanding  HOME EXERCISE PROGRAM: Access Code: [email or web address] URL: [email or web address] Date: 11/08/2022 Prepared by: Ruben Im  Exercises - Seated Upper Trapezius Stretch  - 1 x daily - 7 x weekly - 1 sets - 2-3 reps - 30 hold - Seated Cervical Flexion AROM  - 1 x daily - 7 x weekly - 1 sets - 2-3 reps - 30 hold - Seated Scapular Retraction  - 1 x daily - 7 x weekly - 1 sets - 10 reps - Supine Scapular Retraction  - 1 x daily - 7 x weekly - 1 sets - 10 reps - Seated Isometric Cervical Flexion  - 1 x daily - 7 x weekly - 1 sets - 5 reps - 5 hold - Seated Isometric Cervical Extension  - 1 x daily - 7 x weekly - 1 sets - 5 reps - 5 hold - Seated Isometric Cervical Sidebending  - 1 x daily - 7 x weekly - 1 sets - 5 reps - 5 hold - Seated Isometric Cervical Rotation  - 1 x daily - 7 x weekly - 1 sets - 5 reps - 5 hold - Median Nerve Flossing  - 1 x daily - 7 x weekly - 1 sets - 10 reps - Standing Row with Anchored Resistance  - 1 x daily - 7 x weekly - 3 sets - 10 reps - Shoulder Extension with Resistance Hands Down  - 1 x daily - 7 x weekly - 3 sets - 10 reps - Putty Squeezes  - 1 x daily - 7 x weekly - 3 sets - 10 reps - Tip Pinch with Putty  - 1 x daily - 7  x weekly - 3 sets - 10 reps - Key Pinch with Putty  - 1 x daily - 7 x weekly - 3 sets - 10 reps - 3-Point Pinch with Putty  - 1 x  daily - 7 x weekly - 1 sets - 10 reps - Finger Extension with Putty  - 1 x daily - 7 x weekly - 1 sets - 10 reps - Finger Lumbricals with Putty  - 1 x daily - 7 x weekly - 1 sets - 10 reps - Thumb Opposition with Putty  - 1 x daily - 7 x weekly - 1 sets - 10 reps - Finger Pinch and Pull with Putty  - 1 x daily - 7 x weekly - 1 sets - 10 reps - Seated Finger Composite Flexion with Putty  - 1 x daily - 7 x weekly - 1 sets - 10 reps - Seated Claw Fist with Putty  - 1 x daily - 7 x weekly - 1 sets - 10 reps - Seated Finger MP Flexion with Putty  - 1 x daily - 7 x weekly - 1 sets - 10 reps - Quadruped Forward Weight Shift  - 1 x daily - 7 x weekly - 1 sets - 10 reps - Quadruped Side to Side Weight Shifts  - 1 x daily - 7 x weekly - 1 sets - 10 reps - Quadruped Circle Weight Shifts  - 1 x daily - 7 x weekly - 1 sets - 10 reps  ASSESSMENT:  CLINICAL IMPRESSION: Neck pain has been well under control the past 2 weeks therefore focus has been on upper quarter strengthening proximally but especially distally.  Strength with fine motor tasks is quite difficult particularly on left.  Therapist monitoring response and providing cues to decrease compensatory strategies.    OBJECTIVE IMPAIRMENTS: decreased activity tolerance, impaired perceived functional ability, impaired flexibility, impaired sensation, impaired UE functional use, postural dysfunction, and pain.   ACTIVITY LIMITATIONS: carrying, lifting, sitting, standing, and reach over head  PARTICIPATION LIMITATIONS: interpersonal relationship, community activity, and yard work  PERSONAL FACTORS: Past/current experiences, Time since onset of injury/illness/exacerbation, and 3+ comorbidities: multi regions affected, HTN, A-fib, COPD  are also affecting patient's functional outcome.   REHAB POTENTIAL: Good  CLINICAL DECISION MAKING: Evolving/moderate complexity  EVALUATION COMPLEXITY: Moderate   GOALS: Goals reviewed with patient? Yes  SHORT  TERM GOALS: Target date: 09/15/2022   The patient will demonstrate knowledge of basic self care strategies and exercises to promote healing   Baseline:  Goal status: goal met 11/22 2.  The patient will report a 30% improvement in pain levels with functional activities   Baseline:  Goal status: ongoing  3.  Cervical flexors and extensors improved to 4/5 for improved tolerance with reclined sitting Baseline:  Goal status: goal met 11/22  4.  Shoulder/scapular strength improved to 4/5 needed for lifting/carrying medium objects for yardwork and traveling Baseline:  Goal status: goal met 11/22    LONG TERM GOALS: Target date: 12/06/2022  The patient will be independent in a safe self progression of a home exercise program to promote further recovery of function   Baseline:  Goal status: ongoing  2.  The patient will report a 60% improvement in pain levels with functional activities  Baseline:   30% on 12/20 Goal status: ongoing 3.  Cervical flexors and extensors improved to 4+/5 for improved tolerance with reclined sitting, playing golf Baseline:  Goal status: ongoing  4.  Shoulder/scapular strength improved to  4+/5 needed for lifting/carrying medium to heavier objects for yardwork and suitcases for traveling Baseline:  Goal status: partially met 5.  The patient will have improved FOTO score to   59%    indicating improved function with less pain  Baseline:  Goal status: ongoing  6. Improved left grip strength to 27# for holding a fork and cutting things  NEW   PLAN:  PT FREQUENCY: 1-2x/week  PT DURATION: 8 weeks  PLANNED INTERVENTIONS: Therapeutic exercises, Therapeutic activity, Neuromuscular re-education, Balance training, Gait training, Patient/Family education, Self Care, Joint mobilization, Aquatic Therapy, Dry Needling, Spinal mobilization, Cryotherapy, Moist heat, Taping, Traction, Ultrasound, Manual therapy, and Re-evaluation  PLAN FOR NEXT SESSION: fine motor  strengthening;  UE weight bearing;   green band with handle golf swing;  distal left UE strengthening especially left;  UBE and lat bar, counter push ups or quadruped; proximal UE strengthening,  DN as needed  Ruben Im, PT 11/15/22 6:47 PM Phone: [phone] Fax: [phone]

## 2022-11-22 ENCOUNTER — Ambulatory Visit: Payer: Medicare Other | Admitting: Physical Therapy

## 2022-11-22 DIAGNOSIS — M5412 Radiculopathy, cervical region: Secondary | ICD-10-CM

## 2022-11-22 DIAGNOSIS — M6281 Muscle weakness (generalized): Secondary | ICD-10-CM

## 2022-11-22 NOTE — Therapy (Signed)
OUTPATIENT PHYSICAL THERAPY CERVICAL PROGRESS NOTE     Patient Name: Richard Lynch MRN: [id number] DOB:1951-09-15, 72 y.o., male Today's Date: 08/18/2022  PT End of Session - 11/22/22 1018     Visit Number 15    Date for PT Re-Evaluation 12/06/22    Authorization Type Medicare    PT Start Time 1015    PT Stop Time 1055    PT Time Calculation (min) 40 min    Activity Tolerance Patient tolerated treatment well               Past Medical History:  Diagnosis Date   Atrial fibrillation ([place])    Constipation due to opioid therapy 12/22/2015   Emphysema lung ([place])    Gait abnormality 02/20/2017   GERD (gastroesophageal reflux disease)    High cholesterol    Hypertension    OSA (obstructive sleep apnea)    on CPAP   Primary localized osteoarthritis of right knee 12/08/2015   Septic olecranon bursitis 06/2015   MSSA   Past Surgical History:  Procedure Laterality Date   COLONOSCOPY W/ BIOPSIES AND POLYPECTOMY     ELBOW SURGERY  08/2015   bilateral   EYE SURGERY     'repair of tear to left eye from pliers'   HAND SURGERY Left 08/2018   Carpal Tunnel Surgery- [place]  07/2010   KNEE SURGERY     3 times on both knee's each   LEFT HEART CATH AND CORONARY ANGIOGRAPHY N/A 04/11/2022   Procedure: LEFT HEART CATH AND CORONARY ANGIOGRAPHY;  Surgeon: [name], MD;  Location: [facility];  Service: Cardiovascular;  Laterality: N/A;   MULTIPLE TOOTH EXTRACTIONS     SHOULDER ARTHROSCOPY     TOTAL HIP ARTHROPLASTY  08/2010   TOTAL KNEE ARTHROPLASTY Right 12/20/2015   TOTAL KNEE ARTHROPLASTY Right 12/20/2015   Procedure: TOTAL KNEE ARTHROPLASTY;  Surgeon: [name], MD;  Location: [facility];  Service: Orthopedics;  Laterality: Right;   Patient Active Problem List   Diagnosis Date Noted   Cigarette nicotine dependence without complication 15/72/6203   Coronary artery disease    Paroxysmal atrial fibrillation ([place]) 12/24/2019   Secondary  hypercoagulable state ([place]) 12/24/2019   Dupuytren's contracture 07/02/2018   Spinal stenosis in cervical region 12/17/2017   Carpal tunnel syndrome of left wrist 11/27/2017   Radial nerve palsy, left 11/27/2017   Trigger ring finger of left hand 11/27/2017   Gait abnormality 02/20/2017   Restless leg syndrome 07/05/2016   Constipation due to opioid therapy 12/22/2015   Primary localized osteoarthritis of right knee 12/08/2015   Bullous emphysema ([place]) 11/02/2015   Primary hypertension 11/02/2015   Arthritis 11/02/2015   Hyperlipidemia 11/02/2015   OSA on CPAP 11/02/2015   GERD (gastroesophageal reflux disease) 11/02/2015   Septic olecranon bursitis 06/24/2015   Blepharochalasis 12/29/2014   Hidrocystoma of left eyelid 12/29/2014   Myogenic ptosis of eyelid of both eyes 12/29/2014   Multiple lung nodules on CT 06/30/2014   Combined senile cataract 12/24/2012   HSV epithelial keratitis 12/24/2012   Myopia with astigmatism and presbyopia 12/24/2012   Neurotrophic ulcer ([place]) 12/24/2012    PCP: [name] MD  REFERRING PROVIDER: [name] DO  REFERRING DIAG: cervical radiculopathy  THERAPY DIAG:  Cervical radiculopathy, weakness  Rationale for Evaluation and Treatment: Rehabilitation  ONSET DATE: 10/23/2021  SUBJECTIVE:  SUBJECTIVE STATEMENT: Neck is doing OK.  Sharp pains in hands randomly.  I don't have enough force/speed on that left side when I swing the golf club.  I got the putty at home and I've been using it.     PERTINENT HISTORY:  Both feet numb c/o dec balance Injured neck 25 years ago;  Cervical fusion C5-T3 in 2019.  Currently ruptured disc affecting C8 but surgery too dangerous now, could make the problem worse. Sharp shooting pain in neck and bil UEs  especially left UE.  Left hand dominant.  Both hands a lot weaker, drop things often.  I get tired of shooting pains in arms and legs.  Can hold a pen 20 sec only.  Fatigue with hammering. Difficulty managing a 50# suitcase when traveling recently.  Patient reports my head feels heavy and hard to hold up.   2018-19:  C5-T3 posterior-lateral fusion with decompressive laminectomy from C5-6 to T1-2.  Per Dr. [name] office note: Chronic right hand weakness and paresthesias due to overlapping C8 radiculopathy and entrapment neuropathies. Exam is stable. 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 He has been off IVIG since December 2021.   He has sought second opinion at [facility], who agree that his hand paresthesias and weakness is mostly secondary to C8 radiculopathy with overlapping entrapment neuropathy.  He saw Neurosurgery at [facility] who advised that he is high risk for cervical surgery and recommended conservative therapies.  Management is supportive For pain, continue, continue gabapentin '1200mg'$  at bedtime.  For neck pain, restart tizanidine '2mg'$  BID as needed Start neck PT UPDATE 07/24/2022:  He feels that his hands are continuing to get weaker and often drops objects.  He denies any worsening numbness/tingling.  He also complains of chronic neck pain and stiffness.  He is not taking tizanidine, but does not recall why he stopped it.  Sometimes, he has shooting pain towards his head.  Balance is also poor.  No falls, walks unassisted.  Pain remains relatively controlled on gabapentin '1200mg'$  at bedtime.   HTN Bil Carpal tunnel releases 3 years ago had OT/PT and left elbow release HTN, A-fib, COPD PAIN:  PAIN:  Are you having pain? No, soreness NPRS scale: 0 Pain location: right neck Aggravating factors: reclined back;  after a lot of exertion  (yardwork, playing golf) Relieving factors: lying down; sitting up straight   PRECAUTIONS: None "I can do what I feel comfortable doing."   WEIGHT BEARING RESTRICTIONS: No  FALLS:  Has patient fallen in last 6 months? No but balance is horrible (outside in yard or golf course)   OCCUPATION: retired ; enjoys golf can do 18 holes   PLOF: Independent  PATIENT GOALS: better hand strength, help with neck pain after exertion  OBJECTIVE:   DIAGNOSTIC FINDINGS:  CT on 09/28/2021 IMPRESSION: 1. No acute displaced fracture or traumatic listhesis of the cervical spine in a patient with cervicothoracic posterolateral surgical hardware that is only partially visualized and originates at the C5 level. Persistent hardware lucency surrounding the and screws which is best evaluated on the coronal views and most prominent along the right C5 level. 2. Multilevel degenerative changes of the cervical spine with associated severe osseous neural foraminal stenosis of the non-surgerized levels. 3.  Emphysema (ICD10-J43.9).     NCS/EMG and US of the upper extremities 10/05/2021: There was electrodiagnostic and sonographic  evidence of severe left median neuropathy at the wrist (carpal tunnel syndrome), left ulnar neuropathy with sonographic localization at the elbow as well as a left subchronic C6-8 cervical radiculopathy. There was no electrodiagnostic evidence of a diffuse large fiber polyneuropathy. There appears to also be sonographic evidence consistent with right median neuropathy at the wrist, as may be seen in carpal tunnel syndrome. This was not fully explored on this study given the extensive nature of prior testing and clinical question asked, but could be explored further on a future study if clinically indicated. Clinical correlation advised.    NCS/EMG of the arms 04/01/2019 This is a complex study of the upper extremities.  Findings are as follows:  Bilateral median neuropathy at or distal  to the wrist (severe), consistent with a clinical diagnosis of carpal tunnel syndrome.   Bilateral ulnar neuropathy with slowing across the elbow, mild on the right and severe on the left. Chronic left posterior interosseous neuropathy, very severe.  Chronic C7-8 radiculopathy affecting bilateral upper extremities, moderate in degree electrically.   MRI cervical spine wo contrast 07/04/2018: 1. C5-T3 posterior-lateral fusion with decompressive laminectomy from C5-6 to T1-2. There is no arthrodesis by CT and the C5 and C6 lateral mass screws are loose. 2. Disc and facet degeneration causes multilevel foraminal impingement. Greatest foraminal impingement is seen on the left at C2-3, bilaterally at C3-4, right at C5-6, and right at T1-2. 3. Widely patent canal at levels of laminectomy. Noncompressive spinal stenosis seen at C3-4 and C4-5. 4. Septated fluid collection within the lower laminectomy defect without dural mass effect, best attributed to seroma.   MRI thoracic spine wo contrast 07/05/2018: Cervical and thoracic fusion and laminectomy. 15 mm fluid collection in the laminectomy bed. No cord compression or cord signal abnormality. Left paracentral disc protrusion T7-8 unchanged from the prior MRI PATIENT SURVEYS:  FOTO 54% 12/20: 54%  COGNITION: Overall cognitive status: Within functional limits for tasks assessed  SENSATION:  left 5th finger constantly numb, forearm 5-6 years bilaterally Noted atrophy left hypothenar eminence  POSTURE: rounded shoulders and forward head   Decreased cervical and flexor and extensor strength 4-/5 12/20:  cervical and flexor and extensor strength 4/5 CERVICAL ROM:   Active ROM A/PROM (deg) eval 11/15 12/20 1/17  Flexion 34 36 32 35  Extension 22 40 34 35  Right lateral flexion '25 31 30 30  '$ Left lateral flexion 20 43 38 40  Right rotation 40 55 55   Left rotation 20 50 50    (Blank rows = not tested)  UPPER EXTREMITY ROM:  grossly  WFLS  UPPER EXTREMITY MMT:  MMT Right eval Left eval 11/22 12/20  Shoulder flexion 4+ 4- Left 4 4+  Shoulder extension      Shoulder abduction 4+ 4- Left 4 4+  Shoulder adduction      Shoulder extension      Shoulder internal rotation      Shoulder external rotation      Middle trapezius 4 4-  4  Lower trapezius 4 4-  4  Elbow flexion 5 4    Elbow extension 4 2+  4  Wrist flexion  2+  Left 3-  Wrist extension  2+  Left 3-  Wrist ulnar deviation      Wrist radial deviation      Wrist pronation      Wrist supination      Grip strength 33 24  25 left; 46 right    (Blank rows =  not tested)  Pinch strength right 15#, left 5#  CERVICAL SPECIAL TESTS:  Upper limb tension test (ULTT): Negative and Distraction test: Positive  (seated gentle) No symptoms produced or changed but neural tension bil   TODAY'S TREATMENT:   DATE: 11/22/22 UBE 5 min forward/backward while discussing status 10#  kettlebell lifts and swings left side 10# kettlebell lateral swings left side Lean on hands on railing push ups and single arm weight bearing on railing Lat bar 40# 20x Green band with handle: golf swings 15x right/left  Triceps 20# 2x10 Digiflex 5# and 3# 20x right/left  5# dumbbell with red band attached for resisted pronation and supination left/right 2x10  Therapeutic activities:  gripping, twisting, lifting, pushing, pulling        DATE: 11/15/22 UBE 5 min forward/backward while discussing status Lean on hands on wall with elbow taps 20x Seated 5# plate weight drop and catch 15x easily on right;  left side modified to a drop to floor and re-grip 15x Lat bar 40# 20x Green band with handle: diagonal flexion 10x right/left  Triceps 20# 2x10 Digiflex 5# and 3# 20x right/left  5# dumbbell with red band attached for resisted pronation and supination left/right 2x10  Therapeutic activities:  gripping, twisting, lifting, pushing, pulling       DATE: 11/08/22 UBE 5 min  forward/backward while discussing status Red ball on wall: chest on ball scap squeezes UE raise 15x Lat bar 40# 20x 3# shoulder 3 ways 10x Green band with handle: diagonal flexion 10x right/left  Triceps 20# 2x10 5# dumbbell with red band attached for resisted pronation and supination left/right 2x10  Standing with hands on mat table with towel roll under heel of hands: weight shifting front/back to encourage wrist extension (right stiffer than left) medium Theraputty ex's per HEP Therapeutic activities:  gripping, twisting, lifting, pushing, pulling      PATIENT EDUCATION:  Education details: plan of care  Person educated: Patient Education method: Explanation Education comprehension: verbalized understanding  HOME EXERCISE PROGRAM: Access Code: [email or web address] URL: [email or web address] Date: 11/08/2022 Prepared by: Ruben Im  Exercises - Seated Upper Trapezius Stretch  - 1 x daily - 7 x weekly - 1 sets - 2-3 reps - 30 hold - Seated Cervical Flexion AROM  - 1 x daily - 7 x weekly - 1 sets - 2-3 reps - 30 hold - Seated Scapular Retraction  - 1 x daily - 7 x weekly - 1 sets - 10 reps - Supine Scapular Retraction  - 1 x daily - 7 x weekly - 1 sets - 10 reps - Seated Isometric Cervical Flexion  - 1 x daily - 7 x weekly - 1 sets - 5 reps - 5 hold - Seated Isometric Cervical Extension  - 1 x daily - 7 x weekly - 1 sets - 5 reps - 5 hold - Seated Isometric Cervical Sidebending  - 1 x daily - 7 x weekly - 1 sets - 5 reps - 5 hold - Seated Isometric Cervical Rotation  - 1 x daily - 7 x weekly - 1 sets - 5 reps - 5 hold - Median Nerve Flossing  - 1 x daily - 7 x weekly - 1 sets - 10 reps - Standing Row with Anchored Resistance  - 1 x daily - 7 x weekly - 3 sets - 10 reps - Shoulder Extension with Resistance Hands Down  - 1 x daily - 7 x weekly - 3 sets - 10 reps - Putty Squeezes  -  1 x daily - 7 x weekly - 3 sets - 10 reps - Tip Pinch with Putty  - 1 x daily - 7 x weekly - 3  sets - 10 reps - Key Pinch with Putty  - 1 x daily - 7 x weekly - 3 sets - 10 reps - 3-Point Pinch with Putty  - 1 x daily - 7 x weekly - 1 sets - 10 reps - Finger Extension with Putty  - 1 x daily - 7 x weekly - 1 sets - 10 reps - Finger Lumbricals with Putty  - 1 x daily - 7 x weekly - 1 sets - 10 reps - Thumb Opposition with Putty  - 1 x daily - 7 x weekly - 1 sets - 10 reps - Finger Pinch and Pull with Putty  - 1 x daily - 7 x weekly - 1 sets - 10 reps - Seated Finger Composite Flexion with Putty  - 1 x daily - 7 x weekly - 1 sets - 10 reps - Seated Claw Fist with Putty  - 1 x daily - 7 x weekly - 1 sets - 10 reps - Seated Finger MP Flexion with Putty  - 1 x daily - 7 x weekly - 1 sets - 10 reps - Quadruped Forward Weight Shift  - 1 x daily - 7 x weekly - 1 sets - 10 reps - Quadruped Side to Side Weight Shifts  - 1 x daily - 7 x weekly - 1 sets - 10 reps - Quadruped Circle Weight Shifts  - 1 x daily - 7 x weekly - 1 sets - 10 reps  ASSESSMENT:  CLINICAL IMPRESSION: Proximal and distal UE strengthening particularly focusing on left.  Able to grip 2 inch dumbbell and kettlebell well but smaller/thinner surface gripping is very difficult left > right.  No neck pain today or sharper hand pain.  Therapist continually monitoring response and providing cues to optimize benefit.    OBJECTIVE IMPAIRMENTS: decreased activity tolerance, impaired perceived functional ability, impaired flexibility, impaired sensation, impaired UE functional use, postural dysfunction, and pain.   ACTIVITY LIMITATIONS: carrying, lifting, sitting, standing, and reach over head  PARTICIPATION LIMITATIONS: interpersonal relationship, community activity, and yard work  PERSONAL FACTORS: Past/current experiences, Time since onset of injury/illness/exacerbation, and 3+ comorbidities: multi regions affected, HTN, A-fib, COPD  are also affecting patient's functional outcome.   REHAB POTENTIAL: Good  CLINICAL DECISION  MAKING: Evolving/moderate complexity  EVALUATION COMPLEXITY: Moderate   GOALS: Goals reviewed with patient? Yes  SHORT TERM GOALS: Target date: 09/15/2022   The patient will demonstrate knowledge of basic self care strategies and exercises to promote healing   Baseline:  Goal status: goal met 11/22 2.  The patient will report a 30% improvement in pain levels with functional activities   Baseline:  Goal status: ongoing  3.  Cervical flexors and extensors improved to 4/5 for improved tolerance with reclined sitting Baseline:  Goal status: goal met 11/22  4.  Shoulder/scapular strength improved to 4/5 needed for lifting/carrying medium objects for yardwork and traveling Baseline:  Goal status: goal met 11/22    LONG TERM GOALS: Target date: 12/06/2022  The patient will be independent in a safe self progression of a home exercise program to promote further recovery of function   Baseline:  Goal status: ongoing  2.  The patient will report a 60% improvement in pain levels with functional activities  Baseline:   30% on 12/20 Goal status: ongoing 3.  Cervical flexors and extensors improved to 4+/5 for improved tolerance with reclined sitting, playing golf Baseline:  Goal status: ongoing  4.  Shoulder/scapular strength improved to 4+/5 needed for lifting/carrying medium to heavier objects for yardwork and suitcases for traveling Baseline:  Goal status: partially met 5.  The patient will have improved FOTO score to   59%    indicating improved function with less pain  Baseline:  Goal status: ongoing  6. Improved left grip strength to 27# for holding a fork and cutting things  NEW   PLAN:  PT FREQUENCY: 1-2x/week  PT DURATION: 8 weeks  PLANNED INTERVENTIONS: Therapeutic exercises, Therapeutic activity, Neuromuscular re-education, Balance training, Gait training, Patient/Family education, Self Care, Joint mobilization, Aquatic Therapy, Dry Needling, Spinal mobilization,  Cryotherapy, Moist heat, Taping, Traction, Ultrasound, Manual therapy, and Re-evaluation  PLAN FOR NEXT SESSION: try barbell from knee level;fine motor strengthening;  UE weight bearing;   green band with handle golf swing;  distal left UE strengthening especially left;  UBE and lat bar, counter push ups or quadruped; proximal UE strengthening,  DN as needed  Ruben Im, PT 11/22/22 4:13 PM Phone: [phone] Fax: [phone]

## 2022-11-29 ENCOUNTER — Ambulatory Visit: Payer: Medicare Other | Attending: Neurology | Admitting: Physical Therapy

## 2022-11-29 DIAGNOSIS — M6281 Muscle weakness (generalized): Secondary | ICD-10-CM | POA: Insufficient documentation

## 2022-11-29 DIAGNOSIS — M5412 Radiculopathy, cervical region: Secondary | ICD-10-CM | POA: Diagnosis not present

## 2022-11-29 NOTE — Therapy (Signed)
OUTPATIENT PHYSICAL THERAPY CERVICAL PROGRESS NOTE     Patient Name: Richard Lynch MRN: [id number] DOB:1951-01-15, 72 y.o., male Today's Date: 08/18/2022  PT End of Session - 11/22/22 1018     Visit Number 15    Date for PT Re-Evaluation 12/06/22    Authorization Type Medicare    PT Start Time 1015    PT Stop Time 1055    PT Time Calculation (min) 40 min    Activity Tolerance Patient tolerated treatment well               Past Medical History:  Diagnosis Date   Atrial fibrillation ([place])    Constipation due to opioid therapy 12/22/2015   Emphysema lung ([place])    Gait abnormality 02/20/2017   GERD (gastroesophageal reflux disease)    High cholesterol    Hypertension    OSA (obstructive sleep apnea)    on CPAP   Primary localized osteoarthritis of right knee 12/08/2015   Septic olecranon bursitis 06/2015   MSSA   Past Surgical History:  Procedure Laterality Date   COLONOSCOPY W/ BIOPSIES AND POLYPECTOMY     ELBOW SURGERY  08/2015   bilateral   EYE SURGERY     'repair of tear to left eye from pliers'   HAND SURGERY Left 08/2018   Carpal Tunnel Surgery- [facility]  07/2010   KNEE SURGERY     3 times on both knee's each   LEFT HEART CATH AND CORONARY ANGIOGRAPHY N/A 04/11/2022   Procedure: LEFT HEART CATH AND CORONARY ANGIOGRAPHY;  Surgeon: [name], MD;  Location: [facility];  Service: Cardiovascular;  Laterality: N/A;   MULTIPLE TOOTH EXTRACTIONS     SHOULDER ARTHROSCOPY     TOTAL HIP ARTHROPLASTY  08/2010   TOTAL KNEE ARTHROPLASTY Right 12/20/2015   TOTAL KNEE ARTHROPLASTY Right 12/20/2015   Procedure: TOTAL KNEE ARTHROPLASTY;  Surgeon: [name], MD;  Location: [facility];  Service: Orthopedics;  Laterality: Right;   Patient Active Problem List   Diagnosis Date Noted   Cigarette nicotine dependence without complication 35/70/1779   Coronary artery disease    Paroxysmal atrial fibrillation ([place]) 12/24/2019   Secondary  hypercoagulable state ([place]) 12/24/2019   Dupuytren's contracture 07/02/2018   Spinal stenosis in cervical region 12/17/2017   Carpal tunnel syndrome of left wrist 11/27/2017   Radial nerve palsy, left 11/27/2017   Trigger ring finger of left hand 11/27/2017   Gait abnormality 02/20/2017   Restless leg syndrome 07/05/2016   Constipation due to opioid therapy 12/22/2015   Primary localized osteoarthritis of right knee 12/08/2015   Bullous emphysema ([place]) 11/02/2015   Primary hypertension 11/02/2015   Arthritis 11/02/2015   Hyperlipidemia 11/02/2015   OSA on CPAP 11/02/2015   GERD (gastroesophageal reflux disease) 11/02/2015   Septic olecranon bursitis 06/24/2015   Blepharochalasis 12/29/2014   Hidrocystoma of left eyelid 12/29/2014   Myogenic ptosis of eyelid of both eyes 12/29/2014   Multiple lung nodules on CT 06/30/2014   Combined senile cataract 12/24/2012   HSV epithelial keratitis 12/24/2012   Myopia with astigmatism and presbyopia 12/24/2012   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  My wrists/hands started hurting for no reason at all.  Went on for like an hour, no matter how I positioned them.  That's never happened before.      PERTINENT HISTORY:  Both feet numb c/o dec balance Injured neck 25 years ago;  Cervical fusion C5-T3 in 2019.  Currently ruptured disc affecting C8 but surgery too dangerous now, could make the problem worse. Sharp shooting pain in neck and bil UEs  especially left UE.  Left hand dominant.  Both hands a lot weaker, drop things often.  I get tired of shooting pains in arms and legs.  Can hold a pen 20 sec only.  Fatigue with hammering. Difficulty managing a 50# suitcase when traveling recently.  Patient reports my head feels heavy and hard to hold up.   2018-19:  C5-T3 posterior-lateral fusion with decompressive laminectomy from C5-6 to T1-2.  Per Dr. [name] office note: Chronic right hand weakness and paresthesias due to overlapping C8 radiculopathy and entrapment neuropathies. Exam is stable. 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 He has been off IVIG since December 2021.   He has sought second opinion at [facility], who agree that his hand paresthesias and weakness is mostly secondary to C8 radiculopathy with overlapping entrapment neuropathy.  He saw Neurosurgery at [facility] who advised that he is high risk for cervical surgery and recommended conservative therapies.  Management is supportive For pain, continue, continue gabapentin '1200mg'$  at bedtime.  For neck pain, restart tizanidine '2mg'$  BID as needed Start neck PT UPDATE 07/24/2022:  He feels that his hands are continuing to get weaker and often drops objects.  He denies any worsening numbness/tingling.  He also complains of chronic neck pain and stiffness.  He is not taking tizanidine, but does not recall why he stopped it.  Sometimes, he has shooting pain towards his head.  Balance is also poor.  No falls, walks unassisted.  Pain remains relatively controlled on gabapentin '1200mg'$  at bedtime.   HTN Bil Carpal tunnel releases 3 years ago had OT/PT and left elbow release HTN, A-fib, COPD PAIN:  PAIN:  Are you having pain? No, soreness NPRS scale: 0 Pain location: right neck Aggravating factors: reclined back;  after a lot of exertion  (yardwork, playing golf) Relieving factors: lying down; sitting up straight   PRECAUTIONS: None "I can do what I feel comfortable doing."   WEIGHT BEARING RESTRICTIONS: No  FALLS:  Has patient fallen in last 6 months? No but balance is horrible (outside in yard or golf course)   OCCUPATION: retired ; enjoys golf can do 18 holes   PLOF: Independent  PATIENT GOALS: better hand strength, help with neck pain after exertion  OBJECTIVE:   DIAGNOSTIC FINDINGS:  CT on 09/28/2021 IMPRESSION: 1. No acute displaced fracture or traumatic listhesis of the cervical spine in a patient with cervicothoracic posterolateral surgical hardware that is only partially visualized and originates at the C5 level. Persistent hardware lucency surrounding the and screws which is best evaluated on the coronal views and most prominent along the right C5 level. 2. Multilevel degenerative changes of the cervical spine with associated severe osseous neural foraminal stenosis of the non-surgerized levels. 3.  Emphysema (ICD10-J43.9).     NCS/EMG and US of the upper extremities 10/05/2021: There was electrodiagnostic and sonographic evidence of severe  left median neuropathy at the wrist (carpal tunnel syndrome), left ulnar neuropathy with sonographic localization at the elbow as well as a left subchronic C6-8 cervical radiculopathy. There was no electrodiagnostic evidence of a diffuse large fiber polyneuropathy. There appears to also be sonographic evidence consistent with right median neuropathy at the wrist, as may be seen in carpal tunnel syndrome. This was not fully explored on this study given the extensive nature of prior testing and clinical question asked, but could be explored further on a future study if clinically indicated. Clinical correlation advised.    NCS/EMG of the arms 04/01/2019 This is a complex study of the upper extremities.  Findings are as follows:  Bilateral median neuropathy at or distal  to the wrist (severe), consistent with a clinical diagnosis of carpal tunnel syndrome.   Bilateral ulnar neuropathy with slowing across the elbow, mild on the right and severe on the left. Chronic left posterior interosseous neuropathy, very severe.  Chronic C7-8 radiculopathy affecting bilateral upper extremities, moderate in degree electrically.   MRI cervical spine wo contrast 07/04/2018: 1. C5-T3 posterior-lateral fusion with decompressive laminectomy from C5-6 to T1-2. There is no arthrodesis by CT and the C5 and C6 lateral mass screws are loose. 2. Disc and facet degeneration causes multilevel foraminal impingement. Greatest foraminal impingement is seen on the left at C2-3, bilaterally at C3-4, right at C5-6, and right at T1-2. 3. Widely patent canal at levels of laminectomy. Noncompressive spinal stenosis seen at C3-4 and C4-5. 4. Septated fluid collection within the lower laminectomy defect without dural mass effect, best attributed to seroma.   MRI thoracic spine wo contrast 07/05/2018: Cervical and thoracic fusion and laminectomy. 15 mm fluid collection in the laminectomy bed. No cord compression or cord signal abnormality. Left paracentral disc protrusion T7-8 unchanged from the prior MRI PATIENT SURVEYS:  FOTO 54% 12/20: 54%  COGNITION: Overall cognitive status: Within functional limits for tasks assessed  SENSATION:  left 5th finger constantly numb, forearm 5-6 years bilaterally Noted atrophy left hypothenar eminence  POSTURE: rounded shoulders and forward head   Decreased cervical and flexor and extensor strength 4-/5 12/20:  cervical and flexor and extensor strength 4/5 CERVICAL ROM:   Active ROM A/PROM (deg) eval 11/15 12/20 1/17  Flexion 34 36 32 35  Extension 22 40 34 35  Right lateral flexion '25 31 30 30  '$ Left lateral flexion 20 43 38 40  Right rotation 40 55 55   Left rotation 20 50 50    (Blank rows = not tested)  UPPER EXTREMITY ROM:  grossly  WFLS  UPPER EXTREMITY MMT:  MMT Right eval Left eval 11/22 12/20  Shoulder flexion 4+ 4- Left 4 4+  Shoulder extension      Shoulder abduction 4+ 4- Left 4 4+  Shoulder adduction      Shoulder extension      Shoulder internal rotation      Shoulder external rotation      Middle trapezius 4 4-  4  Lower trapezius 4 4-  4  Elbow flexion 5 4    Elbow extension 4 2+  4  Wrist flexion  2+  Left 3-  Wrist extension  2+  Left 3-  Wrist ulnar deviation      Wrist radial deviation      Wrist pronation      Wrist supination      Grip strength 33 24  25 left; 46 right    (Blank rows = not tested)  Pinch strength right 15#, left 5#  CERVICAL SPECIAL TESTS:  Upper limb tension test (ULTT): Negative and Distraction test: Positive  (seated gentle) No symptoms produced or changed but neural tension bil   TODAY'S TREATMENT:   DATE: 11/29/22 Lean on hands on wall modified bird dogs (added to HEP) Lat bar 40# 20x blue band with handle: golf swings 15x right/left  Triceps 20# 2x10 Digiflex 5# and 3# 20x right/left  5# green band wrist extension and flexion combo with pronation/supination 2x 10 right/left  5# dumbbell with green band attached for resisted pronation and supination left/right 2x10 5# radial deviation 10x right/left 5# ulnar deviation 10x right/left  Therapeutic activities:  gripping, twisting, lifting, pushing, pulling             DATE: 11/22/22 UBE 5 min forward/backward while discussing status 10#  kettlebell lifts and swings left side 10# kettlebell lateral swings left side Lean on hands on railing push ups and single arm weight bearing on railing Lat bar 40# 20x Green band with handle: golf swings 15x right/left  Triceps 20# 2x10 Digiflex 5# and 3# 20x right/left  5# dumbbell with red band attached for resisted pronation and supination left/right 2x10  Therapeutic activities:  gripping, twisting, lifting, pushing, pulling        DATE: 11/15/22 UBE  5 min forward/backward while discussing status Lean on hands on wall with elbow taps 20x Seated 5# plate weight drop and catch 15x easily on right;  left side modified to a drop to floor and re-grip 15x Lat bar 40# 20x Green band with handle: diagonal flexion 10x right/left  Triceps 20# 2x10 Digiflex 5# and 3# 20x right/left  5# dumbbell with red band attached for resisted pronation and supination left/right 2x10  Therapeutic activities:  gripping, twisting, lifting, pushing, pulling     PATIENT EDUCATION:  Education details: plan of care  Person educated: Patient Education method: Explanation Education comprehension: verbalized understanding  HOME EXERCISE PROGRAM: Access Code: [email or web address] URL: [email or web address] Date: 11/29/2022 Prepared by: Ruben Im  Exercises - Seated Upper Trapezius Stretch  - 1 x daily - 7 x weekly - 1 sets - 2-3 reps - 30 hold - Seated Cervical Flexion AROM  - 1 x daily - 7 x weekly - 1 sets - 2-3 reps - 30 hold - Seated Scapular Retraction  - 1 x daily - 7 x weekly - 1 sets - 10 reps - Supine Scapular Retraction  - 1 x daily - 7 x weekly - 1 sets - 10 reps - Seated Isometric Cervical Flexion  - 1 x daily - 7 x weekly - 1 sets - 5 reps - 5 hold - Seated Isometric Cervical Extension  - 1 x daily - 7 x weekly - 1 sets - 5 reps - 5 hold - Seated Isometric Cervical Sidebending  - 1 x daily - 7 x weekly - 1 sets - 5 reps - 5 hold - Seated Isometric Cervical Rotation  - 1 x daily - 7 x weekly - 1 sets - 5 reps - 5 hold - Median Nerve Flossing  - 1 x daily - 7 x weekly - 1 sets - 10 reps - Standing Row with Anchored Resistance  - 1 x daily - 7 x weekly - 3 sets - 10 reps - Shoulder Extension with Resistance Hands Down  - 1 x daily - 7 x weekly - 3 sets - 10 reps - Putty Squeezes  - 1 x daily - 7 x weekly -  3 sets - 10 reps - Tip Pinch with Putty  - 1 x daily - 7 x weekly - 3 sets - 10 reps - Key Pinch with Putty  - 1 x daily - 7 x weekly - 3  sets - 10 reps - 3-Point Pinch with Putty  - 1 x daily - 7 x weekly - 1 sets - 10 reps - Finger Extension with Putty  - 1 x daily - 7 x weekly - 1 sets - 10 reps - Finger Lumbricals with Putty  - 1 x daily - 7 x weekly - 1 sets - 10 reps - Thumb Opposition with Putty  - 1 x daily - 7 x weekly - 1 sets - 10 reps - Finger Pinch and Pull with Putty  - 1 x daily - 7 x weekly - 1 sets - 10 reps - Seated Finger Composite Flexion with Putty  - 1 x daily - 7 x weekly - 1 sets - 10 reps - Seated Claw Fist with Putty  - 1 x daily - 7 x weekly - 1 sets - 10 reps - Seated Finger MP Flexion with Putty  - 1 x daily - 7 x weekly - 1 sets - 10 reps - Quadruped Forward Weight Shift  - 1 x daily - 7 x weekly - 1 sets - 10 reps - Quadruped Side to Side Weight Shifts  - 1 x daily - 7 x weekly - 1 sets - 10 reps - Quadruped Circle Weight Shifts  - 1 x daily - 7 x weekly - 1 sets - 10 reps - Modified Bird Dog At Wall  - 1 x daily - 7 x weekly - 1 sets - 10 reps  ASSESSMENT:  CLINICAL IMPRESSION: Initially had difficulty balancing with modified bird dogs on the wall but improved with repetition.  Added to HEP for benefits with posterior chain strengthening.  Improved distal mobility and strength although wrist extension ROM significantly limited right > left.  No pain or UE paresthesia reported during or immediately following session.  Verbal cues to avoid compensatory strategies to isolate preferred motor pattern.     OBJECTIVE IMPAIRMENTS: decreased activity tolerance, impaired perceived functional ability, impaired flexibility, impaired sensation, impaired UE functional use, postural dysfunction, and pain.   ACTIVITY LIMITATIONS: carrying, lifting, sitting, standing, and reach over head  PARTICIPATION LIMITATIONS: interpersonal relationship, community activity, and yard work  PERSONAL FACTORS: Past/current experiences, Time since onset of injury/illness/exacerbation, and 3+ comorbidities: multi regions  affected, HTN, A-fib, COPD  are also affecting patient's functional outcome.   REHAB POTENTIAL: Good  CLINICAL DECISION MAKING: Evolving/moderate complexity  EVALUATION COMPLEXITY: Moderate   GOALS: Goals reviewed with patient? Yes  SHORT TERM GOALS: Target date: 09/15/2022   The patient will demonstrate knowledge of basic self care strategies and exercises to promote healing   Baseline:  Goal status: goal met 11/22 2.  The patient will report a 30% improvement in pain levels with functional activities   Baseline:  Goal status: ongoing  3.  Cervical flexors and extensors improved to 4/5 for improved tolerance with reclined sitting Baseline:  Goal status: goal met 11/22  4.  Shoulder/scapular strength improved to 4/5 needed for lifting/carrying medium objects for yardwork and traveling Baseline:  Goal status: goal met 11/22    LONG TERM GOALS: Target date: 12/06/2022  The patient will be independent in a safe self progression of a home exercise program to promote further recovery of function   Baseline:  Goal  status: ongoing  2.  The patient will report a 60% improvement in pain levels with functional activities  Baseline:   30% on 12/20 Goal status: ongoing 3.  Cervical flexors and extensors improved to 4+/5 for improved tolerance with reclined sitting, playing golf Baseline:  Goal status: ongoing  4.  Shoulder/scapular strength improved to 4+/5 needed for lifting/carrying medium to heavier objects for yardwork and suitcases for traveling Baseline:  Goal status: partially met 5.  The patient will have improved FOTO score to   59%    indicating improved function with less pain  Baseline:  Goal status: ongoing  6. Improved left grip strength to 27# for holding a fork and cutting things  NEW   PLAN:  PT FREQUENCY: 1-2x/week  PT DURATION: 8 weeks  PLANNED INTERVENTIONS: Therapeutic exercises, Therapeutic activity, Neuromuscular re-education, Balance training,  Gait training, Patient/Family education, Self Care, Joint mobilization, Aquatic Therapy, Dry Needling, Spinal mobilization, Cryotherapy, Moist heat, Taping, Traction, Ultrasound, Manual therapy, and Re-evaluation  PLAN FOR NEXT SESSION: try barbell from knee level;fine motor strengthening;  UE weight bearing;   green band with handle golf swing;  distal left UE strengthening especially left;  UBE and lat bar, counter push ups or quadruped; proximal UE strengthening,  DN as needed  Ruben Im, PT 11/29/22 11:53 AM Phone: [phone] Fax: [phone]

## 2022-12-08 ENCOUNTER — Ambulatory Visit: Payer: Medicare Other | Admitting: Physical Therapy

## 2022-12-08 NOTE — Therapy (Incomplete)
OUTPATIENT PHYSICAL THERAPY CERVICAL PROGRESS NOTE/RECERTIFICATION     Patient Name: Richard Lynch MRN: [id number] DOB:1951/04/26, 72 y.o., male Today's Date: 08/18/2022  PT End of Session - 11/22/22 1018     Visit Number 15    Date for PT Re-Evaluation 12/06/22    Authorization Type Medicare    PT Start Time 1015    PT Stop Time 1055    PT Time Calculation (min) 40 min    Activity Tolerance Patient tolerated treatment well               Past Medical History:  Diagnosis Date   Atrial fibrillation ([place])    Constipation due to opioid therapy 12/22/2015   Emphysema lung ([place])    Gait abnormality 02/20/2017   GERD (gastroesophageal reflux disease)    High cholesterol    Hypertension    OSA (obstructive sleep apnea)    on CPAP   Primary localized osteoarthritis of right knee 12/08/2015   Septic olecranon bursitis 06/2015   MSSA   Past Surgical History:  Procedure Laterality Date   COLONOSCOPY W/ BIOPSIES AND POLYPECTOMY     ELBOW SURGERY  08/2015   bilateral   EYE SURGERY     'repair of tear to left eye from pliers'   HAND SURGERY Left 08/2018   Carpal Tunnel Surgery- [place]  07/2010   KNEE SURGERY     3 times on both knee's each   LEFT HEART CATH AND CORONARY ANGIOGRAPHY N/A 04/11/2022   Procedure: LEFT HEART CATH AND CORONARY ANGIOGRAPHY;  Surgeon: [name], MD;  Location: [facility];  Service: Cardiovascular;  Laterality: N/A;   MULTIPLE TOOTH EXTRACTIONS     SHOULDER ARTHROSCOPY     TOTAL HIP ARTHROPLASTY  08/2010   TOTAL KNEE ARTHROPLASTY Right 12/20/2015   TOTAL KNEE ARTHROPLASTY Right 12/20/2015   Procedure: TOTAL KNEE ARTHROPLASTY;  Surgeon: [name], MD;  Location: [facility];  Service: Orthopedics;  Laterality: Right;   Patient Active Problem List   Diagnosis Date Noted   Cigarette nicotine dependence without complication [id number]   Coronary artery disease    Paroxysmal atrial fibrillation ([place]) 12/24/2019    Secondary hypercoagulable state ([place]) 12/24/2019   Dupuytren's contracture 07/02/2018   Spinal stenosis in cervical region 12/17/2017   Carpal tunnel syndrome of left wrist 11/27/2017   Radial nerve palsy, left 11/27/2017   Trigger ring finger of left hand 11/27/2017   Gait abnormality 02/20/2017   Restless leg syndrome 07/05/2016   Constipation due to opioid therapy 12/22/2015   Primary localized osteoarthritis of right knee 12/08/2015   Bullous emphysema ([place]) 11/02/2015   Primary hypertension 11/02/2015   Arthritis 11/02/2015   Hyperlipidemia 11/02/2015   OSA on CPAP 11/02/2015   GERD (gastroesophageal reflux disease) 11/02/2015   Septic olecranon bursitis 06/24/2015   Blepharochalasis 12/29/2014   Hidrocystoma of left eyelid 12/29/2014   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  My wrists/hands started hurting for no reason at all.  Went on for like an hour, no matter how I positioned them.  That's never happened before.      PERTINENT HISTORY:  Both feet numb c/o dec balance Injured neck 25 years ago;  Cervical fusion C5-T3 in 2019.  Currently ruptured disc affecting C8 but surgery too dangerous now, could make the problem worse. Sharp shooting pain in neck  and bil UEs especially left UE.  Left hand dominant.  Both hands a lot weaker, drop things often.  I get tired of shooting pains in arms and legs.  Can hold a pen 20 sec only.  Fatigue with hammering. Difficulty managing a 50# suitcase when traveling recently.  Patient reports my head feels heavy and hard to hold up.   2018-19:  C5-T3 posterior-lateral fusion with decompressive laminectomy from C5-6 to T1-2.  Per Dr. [name] office note: Chronic right hand weakness and paresthesias due to overlapping C8 radiculopathy and entrapment neuropathies. Exam is stable. 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 He has been off IVIG since December 2021.   He has sought second opinion at [facility], who agree that his hand paresthesias and weakness is mostly secondary to C8 radiculopathy with overlapping entrapment neuropathy.  He saw Neurosurgery at [facility] who advised that he is high risk for cervical surgery and recommended conservative therapies.  Management is supportive For pain, continue, continue gabapentin 1222m at bedtime.  For neck pain, restart tizanidine 231mBID as needed Start neck PT UPDATE 07/24/2022:  He feels that his hands are continuing to get weaker and often drops objects.  He denies any worsening numbness/tingling.  He also complains of chronic neck pain and stiffness.  He is not taking tizanidine, but does not recall why he stopped it.  Sometimes, he has shooting pain towards his head.  Balance is also poor.  No falls, walks unassisted.  Pain remains relatively controlled on gabapentin 120091mt bedtime.   HTN Bil Carpal tunnel releases 3 years ago had OT/PT and left elbow release HTN, A-fib, COPD PAIN:  PAIN:  Are you having pain? No, soreness NPRS scale: 0 Pain location: right neck Aggravating factors: reclined back;  after a lot of  exertion (yardwork, playing golf) Relieving factors: lying down; sitting up straight   PRECAUTIONS: None "I can do what I feel comfortable doing."   WEIGHT BEARING RESTRICTIONS: No  FALLS:  Has patient fallen in last 6 months? No but balance is horrible (outside in yard or golf course)   OCCUPATION: retired ; enjoys golf can do 18 holes   PLOF: Independent  PATIENT GOALS: better hand strength, help with neck pain after exertion  OBJECTIVE:   DIAGNOSTIC FINDINGS:  CT on 09/28/2021 IMPRESSION: 1. No acute displaced fracture or traumatic listhesis of the cervical spine in a patient with cervicothoracic posterolateral surgical hardware that is only partially visualized and originates at the C5 level. Persistent hardware lucency surrounding the and screws which is best evaluated on the coronal views and most prominent along the right C5 level. 2. Multilevel degenerative changes of the cervical spine with associated severe osseous neural foraminal stenosis of the non-surgerized levels. 3.  Emphysema (ICD10-J43.9).     NCS/EMG and [place] the upper extremities 10/05/2021: There was electrodiagnostic and sonographic  evidence of severe left median neuropathy at the wrist (carpal tunnel syndrome), left ulnar neuropathy with sonographic localization at the elbow as well as a left subchronic C6-8 cervical radiculopathy. There was no electrodiagnostic evidence of a diffuse large fiber polyneuropathy. There appears to also be sonographic evidence consistent with right median neuropathy at the wrist, as may be seen in carpal tunnel syndrome. This was not fully explored on this study given the extensive nature of prior testing and clinical question asked, but could be explored further on a future study if clinically indicated. Clinical correlation advised.    NCS/EMG of the arms 04/01/2019 This is a complex study of the upper extremities.  Findings are as follows:  Bilateral median neuropathy at  or distal to the wrist (severe), consistent with a clinical diagnosis of carpal tunnel syndrome.   Bilateral ulnar neuropathy with slowing across the elbow, mild on the right and severe on the left. Chronic left posterior interosseous neuropathy, very severe.  Chronic C7-8 radiculopathy affecting bilateral upper extremities, moderate in degree electrically.   MRI cervical spine wo contrast 07/04/2018: 1. C5-T3 posterior-lateral fusion with decompressive laminectomy from C5-6 to T1-2. There is no arthrodesis by CT and the C5 and C6 lateral mass screws are loose. 2. Disc and facet degeneration causes multilevel foraminal impingement. Greatest foraminal impingement is seen on the left at C2-3, bilaterally at C3-4, right at C5-6, and right at T1-2. 3. Widely patent canal at levels of laminectomy. Noncompressive spinal stenosis seen at C3-4 and C4-5. 4. Septated fluid collection within the lower laminectomy defect without dural mass effect, best attributed to seroma.   MRI thoracic spine wo contrast 07/05/2018: Cervical and thoracic fusion and laminectomy. 15 mm fluid collection in the laminectomy bed. No cord compression or cord signal abnormality. Left paracentral disc protrusion T7-8 unchanged from the prior MRI PATIENT SURVEYS:  FOTO 54% 12/20: 54% 2/16: COGNITION: Overall cognitive status: Within functional limits for tasks assessed  SENSATION:  left 5th finger constantly numb, forearm 5-6 years bilaterally Noted atrophy left hypothenar eminence  POSTURE: rounded shoulders and forward head   Decreased cervical and flexor and extensor strength 4-/5 12/20:  cervical and flexor and extensor strength 4/5 2/16:  CERVICAL ROM:   Active ROM A/PROM (deg) eval 11/15 12/20 1/17 2/16  Flexion 34 36 32 35   Extension 22 40 34 35   Right lateral flexion 25 31 30 30   $ Left lateral flexion 20 43 38 40   Right rotation 40 55 55    Left rotation 20 50 50     (Blank rows = not tested)  UPPER  EXTREMITY ROM:  grossly WFLS  UPPER EXTREMITY MMT:  MMT Right eval Left eval 11/22 12/20 2/16  Shoulder flexion 4+ 4- Left 4 4+   Shoulder extension       Shoulder abduction 4+ 4- Left 4 4+   Shoulder adduction       Shoulder extension       Shoulder internal rotation       Shoulder external rotation       Middle trapezius 4 4-  4   Lower trapezius 4 4-  4   Elbow flexion 5 4     Elbow extension 4 2+  4   Wrist flexion  2+  Left 3-   Wrist extension  2+  Left 3-   Wrist ulnar deviation       Wrist radial deviation       Wrist pronation  Wrist supination       Grip strength 33 24  25 left; 46 right     (Blank rows = not tested)  Pinch strength right 15#, left 5#  CERVICAL SPECIAL TESTS:  Upper limb tension test (ULTT): Negative and Distraction test: Positive  (seated gentle) No symptoms produced or changed but neural tension bil   TODAY'S TREATMENT:   DATE: 12/08/22 Lean on hands on wall modified bird dogs (added to HEP) Lat bar 40# 20x blue band with handle: golf swings 15x right/left  Triceps 20# 2x10 Digiflex 5# and 3# 20x right/left  5# green band wrist extension and flexion combo with pronation/supination 2x 10 right/left  5# dumbbell with green band attached for resisted pronation and supination left/right 2x10 5# radial deviation 10x right/left 5# ulnar deviation 10x right/left  Therapeutic activities:  gripping, twisting, lifting, pushing, pulling        DATE: 11/29/22 Lean on hands on wall modified bird dogs (added to HEP) Lat bar 40# 20x blue band with handle: golf swings 15x right/left  Triceps 20# 2x10 Digiflex 5# and 3# 20x right/left  5# green band wrist extension and flexion combo with pronation/supination 2x 10 right/left  5# dumbbell with green band attached for resisted pronation and supination left/right 2x10 5# radial deviation 10x right/left 5# ulnar deviation 10x right/left  Therapeutic activities:  gripping, twisting, lifting,  pushing, pulling             DATE: 11/22/22 UBE 5 min forward/backward while discussing status 10#  kettlebell lifts and swings left side 10# kettlebell lateral swings left side Lean on hands on railing push ups and single arm weight bearing on railing Lat bar 40# 20x Green band with handle: golf swings 15x right/left  Triceps 20# 2x10 Digiflex 5# and 3# 20x right/left  5# dumbbell with red band attached for resisted pronation and supination left/right 2x10  Therapeutic activities:  gripping, twisting, lifting, pushing, pulling      PATIENT EDUCATION:  Education details: plan of care  Person educated: Patient Education method: Explanation Education comprehension: verbalized understanding  HOME EXERCISE PROGRAM: Access Code: [email or web address] URL: [email or web address] Date: 11/29/2022 Prepared by: Ruben Im  Exercises - Seated Upper Trapezius Stretch  - 1 x daily - 7 x weekly - 1 sets - 2-3 reps - 30 hold - Seated Cervical Flexion AROM  - 1 x daily - 7 x weekly - 1 sets - 2-3 reps - 30 hold - Seated Scapular Retraction  - 1 x daily - 7 x weekly - 1 sets - 10 reps - Supine Scapular Retraction  - 1 x daily - 7 x weekly - 1 sets - 10 reps - Seated Isometric Cervical Flexion  - 1 x daily - 7 x weekly - 1 sets - 5 reps - 5 hold - Seated Isometric Cervical Extension  - 1 x daily - 7 x weekly - 1 sets - 5 reps - 5 hold - Seated Isometric Cervical Sidebending  - 1 x daily - 7 x weekly - 1 sets - 5 reps - 5 hold - Seated Isometric Cervical Rotation  - 1 x daily - 7 x weekly - 1 sets - 5 reps - 5 hold - Median Nerve Flossing  - 1 x daily - 7 x weekly - 1 sets - 10 reps - Standing Row with Anchored Resistance  - 1 x daily - 7 x weekly - 3 sets - 10 reps - Shoulder Extension with Resistance Hands Down  - 1 x  daily - 7 x weekly - 3 sets - 10 reps - Putty Squeezes  - 1 x daily - 7 x weekly - 3 sets - 10 reps - Tip Pinch with Putty  - 1 x daily - 7 x weekly - 3 sets - 10  reps - Key Pinch with Putty  - 1 x daily - 7 x weekly - 3 sets - 10 reps - 3-Point Pinch with Putty  - 1 x daily - 7 x weekly - 1 sets - 10 reps - Finger Extension with Putty  - 1 x daily - 7 x weekly - 1 sets - 10 reps - Finger Lumbricals with Putty  - 1 x daily - 7 x weekly - 1 sets - 10 reps - Thumb Opposition with Putty  - 1 x daily - 7 x weekly - 1 sets - 10 reps - Finger Pinch and Pull with Putty  - 1 x daily - 7 x weekly - 1 sets - 10 reps - Seated Finger Composite Flexion with Putty  - 1 x daily - 7 x weekly - 1 sets - 10 reps - Seated Claw Fist with Putty  - 1 x daily - 7 x weekly - 1 sets - 10 reps - Seated Finger MP Flexion with Putty  - 1 x daily - 7 x weekly - 1 sets - 10 reps - Quadruped Forward Weight Shift  - 1 x daily - 7 x weekly - 1 sets - 10 reps - Quadruped Side to Side Weight Shifts  - 1 x daily - 7 x weekly - 1 sets - 10 reps - Quadruped Circle Weight Shifts  - 1 x daily - 7 x weekly - 1 sets - 10 reps - Modified Bird Dog At Wall  - 1 x daily - 7 x weekly - 1 sets - 10 reps  ASSESSMENT:  CLINICAL IMPRESSION: The patient has had excellent improvements with neck pain reduction since start of care.  His most limiting factor is decreased hand strength and mobility bilaterally.  The patient would benefit from a continuation of skilled PT for a further progression of strengthening and functional mobility.  Will continue to update and promote independence in a HEP needed for a return to the highest functional level possible with ADLs.      OBJECTIVE IMPAIRMENTS: decreased activity tolerance, impaired perceived functional ability, impaired flexibility, impaired sensation, impaired UE functional use, postural dysfunction, and pain.   ACTIVITY LIMITATIONS: carrying, lifting, sitting, standing, and reach over head  PARTICIPATION LIMITATIONS: interpersonal relationship, community activity, and yard work  PERSONAL FACTORS: Past/current experiences, Time since onset of  injury/illness/exacerbation, and 3+ comorbidities: multi regions affected, HTN, A-fib, COPD  are also affecting patient's functional outcome.   REHAB POTENTIAL: Good  CLINICAL DECISION MAKING: Evolving/moderate complexity  EVALUATION COMPLEXITY: Moderate   GOALS: Goals reviewed with patient? Yes  SHORT TERM GOALS: Target date: 09/15/2022   The patient will demonstrate knowledge of basic self care strategies and exercises to promote healing   Baseline:  Goal status: goal met 11/22 2.  The patient will report a 30% improvement in pain levels with functional activities   Baseline:  Goal status: ongoing  3.  Cervical flexors and extensors improved to 4/5 for improved tolerance with reclined sitting Baseline:  Goal status: goal met 11/22  4.  Shoulder/scapular strength improved to 4/5 needed for lifting/carrying medium objects for yardwork and traveling Baseline:  Goal status: goal met 11/22    LONG TERM GOALS:  Target date: 02/02/2023    The patient will be independent in a safe self progression of a home exercise program to promote further recovery of function   Baseline:  Goal status: ongoing  2.  The patient will report a 60% improvement in pain levels with functional activities  Baseline:   30% on 12/20 Goal status: ongoing 3.  Cervical flexors and extensors improved to 4+/5 for improved tolerance with reclined sitting, playing golf Baseline:  Goal status: ongoing  4.  Shoulder/scapular strength improved to 4+/5 needed for lifting/carrying medium to heavier objects for yardwork and suitcases for traveling Baseline:  Goal status: partially met 5.  The patient will have improved FOTO score to   59%    indicating improved function with less pain  Baseline:  Goal status: ongoing  6. Improved left grip strength to 27# for holding a fork and cutting things  NEW   PLAN:  PT FREQUENCY: 1-2x/week  PT DURATION: 8 weeks  PLANNED INTERVENTIONS: Therapeutic exercises,  Therapeutic activity, Neuromuscular re-education, Balance training, Gait training, Patient/Family education, Self Care, Joint mobilization, Aquatic Therapy, Dry Needling, Spinal mobilization, Cryotherapy, Moist heat, Taping, Traction, Ultrasound, Manual therapy, and Re-evaluation  PLAN FOR NEXT SESSION: try barbell from knee level;fine motor strengthening;  UE weight bearing;   green band with handle golf swing;  distal left UE strengthening especially left;  UBE and lat bar, counter push ups or quadruped; proximal UE strengthening

## 2022-12-13 ENCOUNTER — Ambulatory Visit: Payer: Medicare Other | Admitting: Physical Therapy

## 2022-12-13 DIAGNOSIS — M6281 Muscle weakness (generalized): Secondary | ICD-10-CM

## 2022-12-13 DIAGNOSIS — M5412 Radiculopathy, cervical region: Secondary | ICD-10-CM | POA: Diagnosis not present

## 2022-12-13 NOTE — Therapy (Signed)
OUTPATIENT PHYSICAL THERAPY CERVICAL PROGRESS NOTE/RECERTIFICATION     Patient Name: Uriyah C Martinique MRN: [id number] DOB:28-Jul-1951, 72 y.o., male Today's Date: 08/18/2022  PT End of Session - 12/13/22 1103     Visit Number 16    Date for PT Re-Evaluation 02/07/23    Authorization Type Medicare    PT Start Time 1058    PT Stop Time 1138    PT Time Calculation (min) 40 min    Activity Tolerance Patient tolerated treatment well               Past Medical History:  Diagnosis Date   Atrial fibrillation ([place])    Constipation due to opioid therapy 12/22/2015   Emphysema lung ([place])    Gait abnormality 02/20/2017   GERD (gastroesophageal reflux disease)    High cholesterol    Hypertension    OSA (obstructive sleep apnea)    on CPAP   Primary localized osteoarthritis of right knee 12/08/2015   Septic olecranon bursitis 06/2015   MSSA   Past Surgical History:  Procedure Laterality Date   COLONOSCOPY W/ BIOPSIES AND POLYPECTOMY     ELBOW SURGERY  08/2015   bilateral   EYE SURGERY     'repair of tear to left eye from pliers'   HAND SURGERY Left 08/2018   Carpal Tunnel Surgery- [place]  07/2010   KNEE SURGERY     3 times on both knee's each   LEFT HEART CATH AND CORONARY ANGIOGRAPHY N/A 04/11/2022   Procedure: LEFT HEART CATH AND CORONARY ANGIOGRAPHY;  Surgeon: [name], MD;  Location: [facility];  Service: Cardiovascular;  Laterality: N/A;   MULTIPLE TOOTH EXTRACTIONS     SHOULDER ARTHROSCOPY     TOTAL HIP ARTHROPLASTY  08/2010   TOTAL KNEE ARTHROPLASTY Right 12/20/2015   TOTAL KNEE ARTHROPLASTY Right 12/20/2015   Procedure: TOTAL KNEE ARTHROPLASTY;  Surgeon: [name], MD;  Location: [facility];  Service: Orthopedics;  Laterality: Right;   Patient Active Problem List   Diagnosis Date Noted   Cigarette nicotine dependence without complication 99991111   Coronary artery disease    Paroxysmal atrial fibrillation ([place]) 12/24/2019    Secondary hypercoagulable state ([place]) 12/24/2019   Dupuytren's contracture 07/02/2018   Spinal stenosis in cervical region 12/17/2017   Carpal tunnel syndrome of left wrist 11/27/2017   Radial nerve palsy, left 11/27/2017   Trigger ring finger of left hand 11/27/2017   Gait abnormality 02/20/2017   Restless leg syndrome 07/05/2016   Constipation due to opioid therapy 12/22/2015   Primary localized osteoarthritis of right knee 12/08/2015   Bullous emphysema ([place]) 11/02/2015   Primary hypertension 11/02/2015   Arthritis 11/02/2015   Hyperlipidemia 11/02/2015   OSA on CPAP 11/02/2015   GERD (gastroesophageal reflux disease) 11/02/2015   Septic olecranon bursitis 06/24/2015   Blepharochalasis 12/29/2014   Hidrocystoma of left eyelid 12/29/2014   Myogenic ptosis of eyelid of both eyes 12/29/2014   Multiple lung nodules on CT 06/30/2014   Combined senile cataract 12/24/2012   HSV epithelial keratitis 12/24/2012   Myopia with astigmatism and presbyopia 12/24/2012   Neurotrophic ulcer ([place]) 12/24/2012    PCP: [name] MD  REFERRING PROVIDER: [name] DO  REFERRING DIAG: cervical radiculopathy  THERAPY DIAG:  Cervical radiculopathy, weakness  Rationale for Evaluation and Treatment: Rehabilitation  ONSET DATE: 10/23/2021  SUBJECTIVE:  SUBJECTIVE STATEMENT: Played 3 rounds of golf last week.  No neck or UE pain but the thing that's bothering me the most is weakness in my arms especially the left hand.      PERTINENT HISTORY:  Both feet numb c/o dec balance Injured neck 25 years ago;  Cervical fusion C5-T3 in 2019.  Currently ruptured disc affecting C8 but surgery too dangerous now, could make the problem worse. Sharp shooting pain in neck and bil UEs especially left UE.  Left  hand dominant.  Both hands a lot weaker, drop things often.  I get tired of shooting pains in arms and legs.  Can hold a pen 20 sec only.  Fatigue with hammering. Difficulty managing a 50# suitcase when traveling recently.  Patient reports my head feels heavy and hard to hold up.   2018-19:  C5-T3 posterior-lateral fusion with decompressive laminectomy from C5-6 to T1-2.  Per Dr. [name] office note: Chronic right hand weakness and paresthesias due to overlapping C8 radiculopathy and entrapment neuropathies. Exam is stable. 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 He has been off IVIG since December 2021.   He has sought second opinion at [facility], who agree that his hand paresthesias and weakness is mostly secondary to C8 radiculopathy with overlapping entrapment neuropathy.  He saw Neurosurgery at [facility] who advised that he is high risk for cervical surgery and recommended conservative therapies.  Management is supportive For pain, continue, continue gabapentin 1278m at bedtime.  For neck pain, restart tizanidine 225mBID as needed Start neck PT UPDATE 07/24/2022:  He feels that his hands are continuing to get weaker and often drops objects.  He denies any worsening numbness/tingling.  He also complains of chronic neck pain and stiffness.  He is not taking tizanidine, but does not recall why he stopped it.  Sometimes, he has shooting pain towards his head.  Balance is also poor.  No falls, walks unassisted.  Pain remains relatively controlled on gabapentin 12001mt bedtime.   HTN Bil Carpal tunnel releases 3 years ago had OT/PT and left elbow release HTN, A-fib, COPD PAIN:  PAIN:  Are you having pain? No, soreness NPRS scale: 0 Pain location: right neck Aggravating factors: reclined back;  after a lot of exertion (yardwork, playing  golf) Relieving factors: lying down; sitting up straight   PRECAUTIONS: None "I can do what I feel comfortable doing."   WEIGHT BEARING RESTRICTIONS: No  FALLS:  Has patient fallen in last 6 months? No but balance is horrible (outside in yard or golf course)   OCCUPATION: retired ; enjoys golf can do 18 holes   PLOF: Independent  PATIENT GOALS: better hand strength, help with neck pain after exertion  OBJECTIVE:   DIAGNOSTIC FINDINGS:  CT on 09/28/2021 IMPRESSION: 1. No acute displaced fracture or traumatic listhesis of the cervical spine in a patient with cervicothoracic posterolateral surgical hardware that is only partially visualized and originates at the C5 level. Persistent hardware lucency surrounding the and screws which is best evaluated on the coronal views and most prominent along the right C5 level. 2. Multilevel degenerative changes of the cervical spine with associated severe osseous neural foraminal stenosis of the non-surgerized levels. 3.  Emphysema (ICD10-J43.9).     NCS/EMG and [place] the upper extremities 10/05/2021: There was electrodiagnostic and sonographic evidence of severe left median neuropathy at  the wrist (carpal tunnel syndrome), left ulnar neuropathy with sonographic localization at the elbow as well as a left subchronic C6-8 cervical radiculopathy. There was no electrodiagnostic evidence of a diffuse large fiber polyneuropathy. There appears to also be sonographic evidence consistent with right median neuropathy at the wrist, as may be seen in carpal tunnel syndrome. This was not fully explored on this study given the extensive nature of prior testing and clinical question asked, but could be explored further on a future study if clinically indicated. Clinical correlation advised.    NCS/EMG of the arms 04/01/2019 This is a complex study of the upper extremities.  Findings are as follows:  Bilateral median neuropathy at or distal to the wrist  (severe), consistent with a clinical diagnosis of carpal tunnel syndrome.   Bilateral ulnar neuropathy with slowing across the elbow, mild on the right and severe on the left. Chronic left posterior interosseous neuropathy, very severe.  Chronic C7-8 radiculopathy affecting bilateral upper extremities, moderate in degree electrically.   MRI cervical spine wo contrast 07/04/2018: 1. C5-T3 posterior-lateral fusion with decompressive laminectomy from C5-6 to T1-2. There is no arthrodesis by CT and the C5 and C6 lateral mass screws are loose. 2. Disc and facet degeneration causes multilevel foraminal impingement. Greatest foraminal impingement is seen on the left at C2-3, bilaterally at C3-4, right at C5-6, and right at T1-2. 3. Widely patent canal at levels of laminectomy. Noncompressive spinal stenosis seen at C3-4 and C4-5. 4. Septated fluid collection within the lower laminectomy defect without dural mass effect, best attributed to seroma.   MRI thoracic spine wo contrast 07/05/2018: Cervical and thoracic fusion and laminectomy. 15 mm fluid collection in the laminectomy bed. No cord compression or cord signal abnormality. Left paracentral disc protrusion T7-8 unchanged from the prior MRI PATIENT SURVEYS:  FOTO 54% 12/20: 54% 2/16:  69%  COGNITION: Overall cognitive status: Within functional limits for tasks assessed  SENSATION:  left 5th finger constantly numb, forearm 5-6 years bilaterally Noted atrophy left hypothenar eminence  POSTURE: rounded shoulders and forward head   Decreased cervical and flexor and extensor strength 4-/5 12/20:  cervical and flexor and extensor strength 4/5 2/16:  cervical and flexor and extensor strength 4+/5  CERVICAL ROM:   Active ROM A/PROM (deg) eval 11/15 12/20 1/17 2/21  Flexion 34 36 32 35 40  Extension 22 40 34 35 30  Right lateral flexion 25 31 30 30 27  $ Left lateral flexion 20 43 38 40 40  Right rotation 40 55 55  58  Left rotation 20 50  50  48   (Blank rows = not tested)  UPPER EXTREMITY ROM:  grossly WFLS  UPPER EXTREMITY MMT:  MMT Right eval Left eval 11/22 12/20 2/21  Shoulder flexion 4+ 4- Left 4 4+ 5-  Shoulder extension       Shoulder abduction 4+ 4- Left 4 4+ 5-  Shoulder adduction       Shoulder extension       Shoulder internal rotation       Shoulder external rotation       Middle trapezius 4 4-  4 4+  Lower trapezius 4 4-  4 4+  Elbow flexion 5 4     Elbow extension 4 2+  4 4+  Wrist flexion  2+  Left 3- 3+  Wrist extension  2+  Left 3- 3+  Wrist ulnar deviation       Wrist radial deviation  Wrist pronation       Wrist supination       Grip strength 33 24  25 left; 46 right  28 left; 48 right    (Blank rows = not tested)  Pinch strength right 15#, left 5# 2/21:  Right 19#;  left 4#  CERVICAL SPECIAL TESTS:  Upper limb tension test (ULTT): Negative and Distraction test: Positive  (seated gentle) No symptoms produced or changed but neural tension bil   TODAY'S TREATMENT:   DATE: 12/13/22 UBE 5 min while discussing status FOTO Cervical ROM UE MMT Lean on hands on wall modified bird dogs 10x Lat bar 40# 20x blue band with handle: golf swings 15x right/left  Triceps 20# 2x10 Seated push up bars 15x (feet assist) 5# green band wrist extension and flexion combo with radial deviation 2x 10 right/left  5# dumbbell with green band attached for resisted pronation and supination left/right 2x10  Therapeutic activities:  gripping, twisting, lifting, pushing, pulling        DATE: 11/29/22 Lean on hands on wall modified bird dogs (added to HEP) Lat bar 40# 20x blue band with handle: golf swings 15x right/left  Triceps 20# 2x10 Digiflex 5# and 3# 20x right/left  5# green band wrist extension and flexion combo with pronation/supination 2x 10 right/left  5# dumbbell with green band attached for resisted pronation and supination left/right 2x10 5# radial deviation 10x right/left 5# ulnar  deviation 10x right/left  Therapeutic activities:  gripping, twisting, lifting, pushing, pulling             DATE: 11/22/22 UBE 5 min forward/backward while discussing status 10#  kettlebell lifts and swings left side 10# kettlebell lateral swings left side Lean on hands on railing push ups and single arm weight bearing on railing Lat bar 40# 20x Green band with handle: golf swings 15x right/left  Triceps 20# 2x10 Digiflex 5# and 3# 20x right/left  5# dumbbell with red band attached for resisted pronation and supination left/right 2x10  Therapeutic activities:  gripping, twisting, lifting, pushing, pulling      PATIENT EDUCATION:  Education details: plan of care  Person educated: Patient Education method: Explanation Education comprehension: verbalized understanding  HOME EXERCISE PROGRAM: Access Code: [email or web address] URL: [email or web address] Date: 11/29/2022 Prepared by: Ruben Im  Exercises - Seated Upper Trapezius Stretch  - 1 x daily - 7 x weekly - 1 sets - 2-3 reps - 30 hold - Seated Cervical Flexion AROM  - 1 x daily - 7 x weekly - 1 sets - 2-3 reps - 30 hold - Seated Scapular Retraction  - 1 x daily - 7 x weekly - 1 sets - 10 reps - Supine Scapular Retraction  - 1 x daily - 7 x weekly - 1 sets - 10 reps - Seated Isometric Cervical Flexion  - 1 x daily - 7 x weekly - 1 sets - 5 reps - 5 hold - Seated Isometric Cervical Extension  - 1 x daily - 7 x weekly - 1 sets - 5 reps - 5 hold - Seated Isometric Cervical Sidebending  - 1 x daily - 7 x weekly - 1 sets - 5 reps - 5 hold - Seated Isometric Cervical Rotation  - 1 x daily - 7 x weekly - 1 sets - 5 reps - 5 hold - Median Nerve Flossing  - 1 x daily - 7 x weekly - 1 sets - 10 reps - Standing Row with Anchored Resistance  - 1 x daily - 7  x weekly - 3 sets - 10 reps - Shoulder Extension with Resistance Hands Down  - 1 x daily - 7 x weekly - 3 sets - 10 reps - Putty Squeezes  - 1 x daily - 7 x weekly -  3 sets - 10 reps - Tip Pinch with Putty  - 1 x daily - 7 x weekly - 3 sets - 10 reps - Key Pinch with Putty  - 1 x daily - 7 x weekly - 3 sets - 10 reps - 3-Point Pinch with Putty  - 1 x daily - 7 x weekly - 1 sets - 10 reps - Finger Extension with Putty  - 1 x daily - 7 x weekly - 1 sets - 10 reps - Finger Lumbricals with Putty  - 1 x daily - 7 x weekly - 1 sets - 10 reps - Thumb Opposition with Putty  - 1 x daily - 7 x weekly - 1 sets - 10 reps - Finger Pinch and Pull with Putty  - 1 x daily - 7 x weekly - 1 sets - 10 reps - Seated Finger Composite Flexion with Putty  - 1 x daily - 7 x weekly - 1 sets - 10 reps - Seated Claw Fist with Putty  - 1 x daily - 7 x weekly - 1 sets - 10 reps - Seated Finger MP Flexion with Putty  - 1 x daily - 7 x weekly - 1 sets - 10 reps - Quadruped Forward Weight Shift  - 1 x daily - 7 x weekly - 1 sets - 10 reps - Quadruped Side to Side Weight Shifts  - 1 x daily - 7 x weekly - 1 sets - 10 reps - Quadruped Circle Weight Shifts  - 1 x daily - 7 x weekly - 1 sets - 10 reps - Modified Bird Dog At Wall  - 1 x daily - 7 x weekly - 1 sets - 10 reps  ASSESSMENT:  CLINICAL IMPRESSION: The patient has had excellent improvements with neck pain reduction since start of care.  Neck ROM has improved as well in most planes of movement. His most limiting factor is decreased hand strength and mobility bilaterally.  The patient would benefit from a continuation of skilled PT for a further progression of strengthening and functional mobility.  Will continue to update and promote independence in a HEP needed for a return to the highest functional level possible with ADLs.   Will taper visits    OBJECTIVE IMPAIRMENTS: decreased activity tolerance, impaired perceived functional ability, impaired flexibility, impaired sensation, impaired UE functional use, postural dysfunction, and pain.   ACTIVITY LIMITATIONS: carrying, lifting, sitting, standing, and reach over  head  PARTICIPATION LIMITATIONS: interpersonal relationship, community activity, and yard work  PERSONAL FACTORS: Past/current experiences, Time since onset of injury/illness/exacerbation, and 3+ comorbidities: multi regions affected, HTN, A-fib, COPD  are also affecting patient's functional outcome.   REHAB POTENTIAL: Good  CLINICAL DECISION MAKING: Evolving/moderate complexity  EVALUATION COMPLEXITY: Moderate   GOALS: Goals reviewed with patient? Yes  SHORT TERM GOALS: Target date: 09/15/2022   The patient will demonstrate knowledge of basic self care strategies and exercises to promote healing   Baseline:  Goal status: goal met 11/22 2.  The patient will report a 30% improvement in pain levels with functional activities   Baseline:  Goal status: ongoing  3.  Cervical flexors and extensors improved to 4/5 for improved tolerance with reclined sitting Baseline:  Goal status:  goal met 11/22  4.  Shoulder/scapular strength improved to 4/5 needed for lifting/carrying medium objects for yardwork and traveling Baseline:  Goal status: goal met 11/22    LONG TERM GOALS: Target date: 02/02/2023    The patient will be independent in a safe self progression of a home exercise program to promote further recovery of function   Baseline:  Goal status: ongoing  2.  The patient will report a 60% improvement in pain levels with functional activities  Baseline:   30% on 12/20;  2/21: 70% Goal status: met 2/21  3.  Cervical flexors and extensors improved to 4+/5 for improved tolerance with reclined sitting, playing golf Baseline:  Goal status: met 2/21  4.  Shoulder/scapular strength improved to 4+/5 needed for lifting/carrying medium to heavier objects for yardwork and suitcases for traveling Baseline:  Goal status:met 2/21  5.  The patient will have improved FOTO score to   59%    indicating improved function with less pain  Baseline:  Goal status: met 2/21  6. Improved left  grip strength to 30# for holding a fork and cutting things  revised  7. Left pinch strength to 6#  needed for gripping eating utensils new   PLAN:  PT FREQUENCY: 1x/week decreasing to biweekly  PT DURATION: 8 weeks  PLANNED INTERVENTIONS: Therapeutic exercises, Therapeutic activity, Neuromuscular re-education, Balance training, Gait training, Patient/Family education, Self Care, Joint mobilization, Aquatic Therapy, Dry Needling, Spinal mobilization, Cryotherapy, Moist heat, Taping, Traction, Ultrasound, Manual therapy, and Re-evaluation  PLAN FOR NEXT SESSION:  barbell from knee level;fine motor strengthening;  UE weight bearing;   green band with handle golf swing;  distal left UE strengthening especially left;  UBE and lat bar, counter push ups or quadruped; proximal UE strengthening  Ruben Im, PT 12/13/22 9:35 PM Phone: [phone] Fax: [phone]

## 2022-12-20 ENCOUNTER — Ambulatory Visit: Payer: Medicare Other | Admitting: Physical Therapy

## 2022-12-20 DIAGNOSIS — M5412 Radiculopathy, cervical region: Secondary | ICD-10-CM

## 2022-12-20 DIAGNOSIS — M6281 Muscle weakness (generalized): Secondary | ICD-10-CM | POA: Diagnosis not present

## 2022-12-20 NOTE — Therapy (Signed)
OUTPATIENT PHYSICAL THERAPY CERVICAL PROGRESS NOTE     Patient Name: Richard Lynch MRN: [id number] DOB:1951-03-13, 72 y.o., male Today's Date: 08/18/2022  PT End of Session - 12/13/22 1103     Visit Number 16    Date for PT Re-Evaluation 02/07/23    Authorization Type Medicare    PT Start Time 1058    PT Stop Time 1138    PT Time Calculation (min) 40 min    Activity Tolerance Patient tolerated treatment well               Past Medical History:  Diagnosis Date   Atrial fibrillation ([place])    Constipation due to opioid therapy 12/22/2015   Emphysema lung ([place])    Gait abnormality 02/20/2017   GERD (gastroesophageal reflux disease)    High cholesterol    Hypertension    OSA (obstructive sleep apnea)    on CPAP   Primary localized osteoarthritis of right knee 12/08/2015   Septic olecranon bursitis 06/2015   MSSA   Past Surgical History:  Procedure Laterality Date   COLONOSCOPY W/ BIOPSIES AND POLYPECTOMY     ELBOW SURGERY  08/2015   bilateral   EYE SURGERY     'repair of tear to left eye from pliers'   HAND SURGERY Left 08/2018   Carpal Tunnel Surgery- [place]  07/2010   KNEE SURGERY     3 times on both knee's each   LEFT HEART CATH AND CORONARY ANGIOGRAPHY N/A 04/11/2022   Procedure: LEFT HEART CATH AND CORONARY ANGIOGRAPHY;  Surgeon: [name], MD;  Location: [facility];  Service: Cardiovascular;  Laterality: N/A;   MULTIPLE TOOTH EXTRACTIONS     SHOULDER ARTHROSCOPY     TOTAL HIP ARTHROPLASTY  08/2010   TOTAL KNEE ARTHROPLASTY Right 12/20/2015   TOTAL KNEE ARTHROPLASTY Right 12/20/2015   Procedure: TOTAL KNEE ARTHROPLASTY;  Surgeon: [name], MD;  Location: [facility];  Service: Orthopedics;  Laterality: Right;   Patient Active Problem List   Diagnosis Date Noted   Cigarette nicotine dependence without complication 99991111   Coronary artery disease    Paroxysmal atrial fibrillation ([place]) 12/24/2019   Secondary  hypercoagulable state ([place]) 12/24/2019   Dupuytren's contracture 07/02/2018   Spinal stenosis in cervical region 12/17/2017   Carpal tunnel syndrome of left wrist 11/27/2017   Radial nerve palsy, left 11/27/2017   Trigger ring finger of left hand 11/27/2017   Gait abnormality 02/20/2017   Restless leg syndrome 07/05/2016   Constipation due to opioid therapy 12/22/2015   Primary localized osteoarthritis of right knee 12/08/2015   Bullous emphysema ([place]) 11/02/2015   Primary hypertension 11/02/2015   Arthritis 11/02/2015   Hyperlipidemia 11/02/2015   OSA on CPAP 11/02/2015   GERD (gastroesophageal reflux disease) 11/02/2015   Septic olecranon bursitis 06/24/2015   Blepharochalasis 12/29/2014   Hidrocystoma of left eyelid 12/29/2014   Myogenic ptosis of eyelid of both eyes 12/29/2014   Multiple lung nodules on CT 06/30/2014   Combined senile cataract 12/24/2012   HSV epithelial keratitis 12/24/2012   Myopia with astigmatism and presbyopia 12/24/2012   Neurotrophic ulcer ([place]) 12/24/2012    PCP: [name] MD  REFERRING PROVIDER: [name] DO  REFERRING DIAG: cervical radiculopathy  THERAPY DIAG:  Cervical radiculopathy, weakness  Rationale for Evaluation and Treatment: Rehabilitation  ONSET DATE: 10/23/2021  SUBJECTIVE:  SUBJECTIVE STATEMENT: No pain in neck.  Reports a locking up or catching in right shoulder with reaching back/internally rotating shoulder.   Weakness in left > right hand.      PERTINENT HISTORY:  Both feet numb c/o dec balance Injured neck 25 years ago;  Cervical fusion C5-T3 in 2019.  Currently ruptured disc affecting C8 but surgery too dangerous now, could make the problem worse. Sharp shooting pain in neck and bil UEs especially left UE.  Left hand  dominant.  Both hands a lot weaker, drop things often.  I get tired of shooting pains in arms and legs.  Can hold a pen 20 sec only.  Fatigue with hammering. Difficulty managing a 50# suitcase when traveling recently.  Patient reports my head feels heavy and hard to hold up.   2018-19:  C5-T3 posterior-lateral fusion with decompressive laminectomy from C5-6 to T1-2.  Per Dr. [name] office note: Chronic right hand weakness and paresthesias due to overlapping C8 radiculopathy and entrapment neuropathies. Exam is stable. 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 He has been off IVIG since December 2021.   He has sought second opinion at [place], who agree that his hand paresthesias and weakness is mostly secondary to C8 radiculopathy with overlapping entrapment neuropathy.  He saw Neurosurgery at [facility] who advised that he is high risk for cervical surgery and recommended conservative therapies.  Management is supportive For pain, continue, continue gabapentin '1200mg'$  at bedtime.  For neck pain, restart tizanidine '2mg'$  BID as needed Start neck PT UPDATE 07/24/2022:  He feels that his hands are continuing to get weaker and often drops objects.  He denies any worsening numbness/tingling.  He also complains of chronic neck pain and stiffness.  He is not taking tizanidine, but does not recall why he stopped it.  Sometimes, he has shooting pain towards his head.  Balance is also poor.  No falls, walks unassisted.  Pain remains relatively controlled on gabapentin '1200mg'$  at bedtime.   HTN Bil Carpal tunnel releases 3 years ago had OT/PT and left elbow release HTN, A-fib, COPD PAIN:  PAIN:  Are you having pain? No, soreness NPRS scale: 0 Pain location: right neck Aggravating factors: reclined back;  after a lot of exertion (yardwork, playing  golf) Relieving factors: lying down; sitting up straight   PRECAUTIONS: None "I can do what I feel comfortable doing."   WEIGHT BEARING RESTRICTIONS: No  FALLS:  Has patient fallen in last 6 months? No but balance is horrible (outside in yard or golf course)   OCCUPATION: retired ; enjoys golf can do 18 holes   PLOF: Independent  PATIENT GOALS: better hand strength, help with neck pain after exertion  OBJECTIVE:   DIAGNOSTIC FINDINGS:  CT on 09/28/2021 IMPRESSION: 1. No acute displaced fracture or traumatic listhesis of the cervical spine in a patient with cervicothoracic posterolateral surgical hardware that is only partially visualized and originates at the C5 level. Persistent hardware lucency surrounding the and screws which is best evaluated on the coronal views and most prominent along the right C5 level. 2. Multilevel degenerative changes of the cervical spine with associated severe osseous neural foraminal stenosis of the non-surgerized levels. 3.  Emphysema (ICD10-J43.9).     NCS/EMG and US of the upper extremities 10/05/2021: There was electrodiagnostic and sonographic evidence of severe left median neuropathy at the wrist (carpal  tunnel syndrome), left ulnar neuropathy with sonographic localization at the elbow as well as a left subchronic C6-8 cervical radiculopathy. There was no electrodiagnostic evidence of a diffuse large fiber polyneuropathy. There appears to also be sonographic evidence consistent with right median neuropathy at the wrist, as may be seen in carpal tunnel syndrome. This was not fully explored on this study given the extensive nature of prior testing and clinical question asked, but could be explored further on a future study if clinically indicated. Clinical correlation advised.    NCS/EMG of the arms 04/01/2019 This is a complex study of the upper extremities.  Findings are as follows:  Bilateral median neuropathy at or distal to the wrist  (severe), consistent with a clinical diagnosis of carpal tunnel syndrome.   Bilateral ulnar neuropathy with slowing across the elbow, mild on the right and severe on the left. Chronic left posterior interosseous neuropathy, very severe.  Chronic C7-8 radiculopathy affecting bilateral upper extremities, moderate in degree electrically.   MRI cervical spine wo contrast 07/04/2018: 1. C5-T3 posterior-lateral fusion with decompressive laminectomy from C5-6 to T1-2. There is no arthrodesis by CT and the C5 and C6 lateral mass screws are loose. 2. Disc and facet degeneration causes multilevel foraminal impingement. Greatest foraminal impingement is seen on the left at C2-3, bilaterally at C3-4, right at C5-6, and right at T1-2. 3. Widely patent canal at levels of laminectomy. Noncompressive spinal stenosis seen at C3-4 and C4-5. 4. Septated fluid collection within the lower laminectomy defect without dural mass effect, best attributed to seroma.   MRI thoracic spine wo contrast 07/05/2018: Cervical and thoracic fusion and laminectomy. 15 mm fluid collection in the laminectomy bed. No cord compression or cord signal abnormality. Left paracentral disc protrusion T7-8 unchanged from the prior MRI PATIENT SURVEYS:  FOTO 54% 12/20: 54% 2/16:  69%  COGNITION: Overall cognitive status: Within functional limits for tasks assessed  SENSATION:  left 5th finger constantly numb, forearm 5-6 years bilaterally Noted atrophy left hypothenar eminence  POSTURE: rounded shoulders and forward head   Decreased cervical and flexor and extensor strength 4-/5 12/20:  cervical and flexor and extensor strength 4/5 2/16:  cervical and flexor and extensor strength 4+/5  CERVICAL ROM:   Active ROM A/PROM (deg) eval 11/15 12/20 1/17 2/21  Flexion 34 36 32 35 40  Extension 22 40 34 35 30  Right lateral flexion '25 31 30 30 27  '$ Left lateral flexion 20 43 38 40 40  Right rotation 40 55 55  58  Left rotation 20 50  50  48   (Blank rows = not tested)  UPPER EXTREMITY ROM:  grossly WFLS  UPPER EXTREMITY MMT:  MMT Right eval Left eval 11/22 12/20 2/21  Shoulder flexion 4+ 4- Left 4 4+ 5-  Shoulder extension       Shoulder abduction 4+ 4- Left 4 4+ 5-  Shoulder adduction       Shoulder extension       Shoulder internal rotation       Shoulder external rotation       Middle trapezius 4 4-  4 4+  Lower trapezius 4 4-  4 4+  Elbow flexion 5 4     Elbow extension 4 2+  4 4+  Wrist flexion  2+  Left 3- 3+  Wrist extension  2+  Left 3- 3+  Wrist ulnar deviation       Wrist radial deviation       Wrist pronation  Wrist supination       Grip strength 33 24  25 left; 46 right  28 left; 48 right    (Blank rows = not tested)  Pinch strength right 15#, left 5# 2/21:  Right 19#;  left 4#  CERVICAL SPECIAL TESTS:  Upper limb tension test (ULTT): Negative and Distraction test: Positive  (seated gentle) No symptoms produced or changed but neural tension bil   TODAY'S TREATMENT:   DATE: 12/20/22 UBE 5 min while discussing status 3# forward, lateral and scaption raises 8x each way 30# barbell dead lifts 20x Chair push ups with min LE assist 15x Yellow loop with 5# weight supination 15x right/left Yellow loop with 5# around wrist with 5# wrist extension Pink squish ball pinch and twisting Chair push ups 10x (challenging) WB on Ues on counter shoulder taps 10x Red power loop with 5# weight pronation right /left 15x Lat bar 40# 20x blue band with handle: diagonal flexion 15x right/left   Therapeutic activities:  gripping, twisting, lifting, pushing, pulling        DATE: 12/13/22 UBE 5 min while discussing status FOTO Cervical ROM UE MMT Lean on hands on wall modified bird dogs 10x Lat bar 40# 20x blue band with handle: golf swings 15x right/left  Triceps 20# 2x10 Seated push up bars 15x (feet assist) 5# green band wrist extension and flexion combo with radial deviation 2x 10  right/left  5# dumbbell with green band attached for resisted pronation and supination left/right 2x10  Therapeutic activities:  gripping, twisting, lifting, pushing, pulling        DATE: 11/29/22 Lean on hands on wall modified bird dogs (added to HEP) Lat bar 40# 20x blue band with handle: golf swings 15x right/left  Triceps 20# 2x10 Digiflex 5# and 3# 20x right/left  5# green band wrist extension and flexion combo with pronation/supination 2x 10 right/left  5# dumbbell with green band attached for resisted pronation and supination left/right 2x10 5# radial deviation 10x right/left 5# ulnar deviation 10x right/left  Therapeutic activities:  gripping, twisting, lifting, pushing, pulling      PATIENT EDUCATION:  Education details: plan of care  Person educated: Patient Education method: Explanation Education comprehension: verbalized understanding  HOME EXERCISE PROGRAM: Access Code: [email or web address] URL: [email or web address] Date: 11/29/2022 Prepared by: Ruben Im  Exercises - Seated Upper Trapezius Stretch  - 1 x daily - 7 x weekly - 1 sets - 2-3 reps - 30 hold - Seated Cervical Flexion AROM  - 1 x daily - 7 x weekly - 1 sets - 2-3 reps - 30 hold - Seated Scapular Retraction  - 1 x daily - 7 x weekly - 1 sets - 10 reps - Supine Scapular Retraction  - 1 x daily - 7 x weekly - 1 sets - 10 reps - Seated Isometric Cervical Flexion  - 1 x daily - 7 x weekly - 1 sets - 5 reps - 5 hold - Seated Isometric Cervical Extension  - 1 x daily - 7 x weekly - 1 sets - 5 reps - 5 hold - Seated Isometric Cervical Sidebending  - 1 x daily - 7 x weekly - 1 sets - 5 reps - 5 hold - Seated Isometric Cervical Rotation  - 1 x daily - 7 x weekly - 1 sets - 5 reps - 5 hold - Median Nerve Flossing  - 1 x daily - 7 x weekly - 1 sets - 10 reps - Standing Row with Anchored Resistance  - 1  x daily - 7 x weekly - 3 sets - 10 reps - Shoulder Extension with Resistance Hands Down  - 1 x daily -  7 x weekly - 3 sets - 10 reps - Putty Squeezes  - 1 x daily - 7 x weekly - 3 sets - 10 reps - Tip Pinch with Putty  - 1 x daily - 7 x weekly - 3 sets - 10 reps - Key Pinch with Putty  - 1 x daily - 7 x weekly - 3 sets - 10 reps - 3-Point Pinch with Putty  - 1 x daily - 7 x weekly - 1 sets - 10 reps - Finger Extension with Putty  - 1 x daily - 7 x weekly - 1 sets - 10 reps - Finger Lumbricals with Putty  - 1 x daily - 7 x weekly - 1 sets - 10 reps - Thumb Opposition with Putty  - 1 x daily - 7 x weekly - 1 sets - 10 reps - Finger Pinch and Pull with Putty  - 1 x daily - 7 x weekly - 1 sets - 10 reps - Seated Finger Composite Flexion with Putty  - 1 x daily - 7 x weekly - 1 sets - 10 reps - Seated Claw Fist with Putty  - 1 x daily - 7 x weekly - 1 sets - 10 reps - Seated Finger MP Flexion with Putty  - 1 x daily - 7 x weekly - 1 sets - 10 reps - Quadruped Forward Weight Shift  - 1 x daily - 7 x weekly - 1 sets - 10 reps - Quadruped Side to Side Weight Shifts  - 1 x daily - 7 x weekly - 1 sets - 10 reps - Quadruped Circle Weight Shifts  - 1 x daily - 7 x weekly - 1 sets - 10 reps - Modified Bird Dog At Wall  - 1 x daily - 7 x weekly - 1 sets - 10 reps  ASSESSMENT:  CLINICAL IMPRESSION: Steady progression of proximal and distal strengthening.  Chair height push ups are quite challenging.  Left fine motor control more difficult than right with reduced sensation as well.  Therapist monitoring response throughout session and providing cues to emphasize upright posture and wrist mobility.   OBJECTIVE IMPAIRMENTS: decreased activity tolerance, impaired perceived functional ability, impaired flexibility, impaired sensation, impaired UE functional use, postural dysfunction, and pain.   ACTIVITY LIMITATIONS: carrying, lifting, sitting, standing, and reach over head  PARTICIPATION LIMITATIONS: interpersonal relationship, community activity, and yard work  PERSONAL FACTORS: Past/current experiences,  Time since onset of injury/illness/exacerbation, and 3+ comorbidities: multi regions affected, HTN, A-fib, COPD  are also affecting patient's functional outcome.   REHAB POTENTIAL: Good  CLINICAL DECISION MAKING: Evolving/moderate complexity  EVALUATION COMPLEXITY: Moderate   GOALS: Goals reviewed with patient? Yes  SHORT TERM GOALS: Target date: 09/15/2022   The patient will demonstrate knowledge of basic self care strategies and exercises to promote healing   Baseline:  Goal status: goal met 11/22 2.  The patient will report a 30% improvement in pain levels with functional activities   Baseline:  Goal status: ongoing  3.  Cervical flexors and extensors improved to 4/5 for improved tolerance with reclined sitting Baseline:  Goal status: goal met 11/22  4.  Shoulder/scapular strength improved to 4/5 needed for lifting/carrying medium objects for yardwork and traveling Baseline:  Goal status: goal met 11/22    LONG TERM GOALS: Target date: 02/02/2023  The patient will be independent in a safe self progression of a home exercise program to promote further recovery of function   Baseline:  Goal status: ongoing  2.  The patient will report a 60% improvement in pain levels with functional activities  Baseline:   30% on 12/20;  2/21: 70% Goal status: met 2/21  3.  Cervical flexors and extensors improved to 4+/5 for improved tolerance with reclined sitting, playing golf Baseline:  Goal status: met 2/21  4.  Shoulder/scapular strength improved to 4+/5 needed for lifting/carrying medium to heavier objects for yardwork and suitcases for traveling Baseline:  Goal status:met 2/21  5.  The patient will have improved FOTO score to   59%    indicating improved function with less pain  Baseline:  Goal status: met 2/21  6. Improved left grip strength to 30# for holding a fork and cutting things  revised  7. Left pinch strength to 6#  needed for gripping eating  utensils new   PLAN:  PT FREQUENCY: 1x/week decreasing to biweekly  PT DURATION: 8 weeks  PLANNED INTERVENTIONS: Therapeutic exercises, Therapeutic activity, Neuromuscular re-education, Balance training, Gait training, Patient/Family education, Self Care, Joint mobilization, Aquatic Therapy, Dry Needling, Spinal mobilization, Cryotherapy, Moist heat, Taping, Traction, Ultrasound, Manual therapy, and Re-evaluation  PLAN FOR NEXT SESSION:  barbell from knee level;fine motor strengthening;  UE weight bearing;   green band with handle golf swing;  distal left UE strengthening especially left;  UBE and lat bar, counter push ups or quadruped; proximal UE strengthening  Ruben Im, PT 12/20/22 3:13 PM Phone: [phone] Fax: [phone]

## 2022-12-26 ENCOUNTER — Ambulatory Visit: Payer: Medicare Other | Attending: Neurology | Admitting: Physical Therapy

## 2022-12-26 DIAGNOSIS — M5412 Radiculopathy, cervical region: Secondary | ICD-10-CM | POA: Insufficient documentation

## 2022-12-26 DIAGNOSIS — M6281 Muscle weakness (generalized): Secondary | ICD-10-CM | POA: Insufficient documentation

## 2022-12-26 NOTE — Therapy (Addendum)
OUTPATIENT PHYSICAL THERAPY CERVICAL PROGRESS [place] SUMMARY     Patient Name: Richard Lynch MRN: [id number] DOB:1951-05-11, 72 y.o., male Today's Date: 08/18/2022  PT End of Session - 12/26/22 1616     Visit Number 18    Date for PT Re-Evaluation 02/07/23    Authorization Type Medicare    PT Start Time 1616    PT Stop Time [id number]    PT Time Calculation (min) 42 min    Activity Tolerance Patient tolerated treatment well               Past Medical History:  Diagnosis Date   Atrial fibrillation ([place])    Constipation due to opioid therapy 12/22/2015   Emphysema lung ([place])    Gait abnormality 02/20/2017   GERD (gastroesophageal reflux disease)    High cholesterol    Hypertension    OSA (obstructive sleep apnea)    on CPAP   Primary localized osteoarthritis of right knee 12/08/2015   Septic olecranon bursitis 06/2015   MSSA   Past Surgical History:  Procedure Laterality Date   COLONOSCOPY W/ BIOPSIES AND POLYPECTOMY     ELBOW SURGERY  08/2015   bilateral   EYE SURGERY     'repair of tear to left eye from pliers'   HAND SURGERY Left 08/2018   Carpal Tunnel Surgery- [place]  07/2010   KNEE SURGERY     3 times on both knee's each   LEFT HEART CATH AND CORONARY ANGIOGRAPHY N/A 04/11/2022   Procedure: LEFT HEART CATH AND CORONARY ANGIOGRAPHY;  Surgeon: [name], MD;  Location: [facility];  Service: Cardiovascular;  Laterality: N/A;   MULTIPLE TOOTH EXTRACTIONS     SHOULDER ARTHROSCOPY     TOTAL HIP ARTHROPLASTY  08/2010   TOTAL KNEE ARTHROPLASTY Right 12/20/2015   TOTAL KNEE ARTHROPLASTY Right 12/20/2015   Procedure: TOTAL KNEE ARTHROPLASTY;  Surgeon: [name], MD;  Location: [facility];  Service: Orthopedics;  Laterality: Right;   Patient Active Problem List   Diagnosis Date Noted   Cigarette nicotine dependence without complication [id number]   Coronary artery disease    Paroxysmal atrial fibrillation ([place]) 12/24/2019    Secondary hypercoagulable state ([place]) 12/24/2019   Dupuytren's contracture 07/02/2018   Spinal stenosis in cervical region 12/17/2017   Carpal tunnel syndrome of left wrist 11/27/2017   Radial nerve palsy, left 11/27/2017   Trigger ring finger of left hand 11/27/2017   Gait abnormality 02/20/2017   Restless leg syndrome 07/05/2016   Constipation due to opioid therapy 12/22/2015   Primary localized osteoarthritis of right knee 12/08/2015   Bullous emphysema ([place]) 11/02/2015   Primary hypertension 11/02/2015   Arthritis 11/02/2015   Hyperlipidemia 11/02/2015   OSA on CPAP 11/02/2015   GERD (gastroesophageal reflux disease) 11/02/2015   Septic olecranon bursitis 06/24/2015   Blepharochalasis 12/29/2014   Hidrocystoma of left eyelid 12/29/2014   Myogenic ptosis of eyelid of both eyes 12/29/2014   Multiple lung nodules on CT 06/30/2014   Combined senile cataract 12/24/2012   HSV epithelial keratitis 12/24/2012   Myopia with astigmatism and presbyopia 12/24/2012   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      PERTINENT HISTORY:  Both feet numb c/o dec balance Injured neck 25 years ago;  Cervical fusion C5-T3 in 2019.  Currently ruptured disc affecting C8 but surgery too dangerous now, could make the problem worse. Sharp shooting pain in neck and bil UEs especially left UE.  Left hand dominant.  Both hands a lot weaker, drop things  often.  I get tired of shooting pains in arms and legs.  Can hold a pen 20 sec only.  Fatigue with hammering. Difficulty managing a 50# suitcase when traveling recently.  Patient reports my head feels heavy and hard to hold up.   2018-19:  C5-T3 posterior-lateral fusion with decompressive laminectomy from C5-6 to T1-2.  Per Dr. [name] office note: Chronic right hand weakness and paresthesias due to overlapping C8 radiculopathy and entrapment neuropathies. Exam is stable. 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 He has been off IVIG since December 2021.   He has sought second opinion at [place] [facility], who agree that his hand paresthesias and weakness is mostly secondary to C8 radiculopathy with overlapping entrapment neuropathy.  He saw Neurosurgery at [facility] who advised that he is high risk for cervical surgery and recommended conservative therapies.  Management is supportive For pain, continue, continue gabapentin 1200mg  at bedtime.  For neck pain, restart tizanidine 2mg  BID as needed Start neck PT UPDATE 07/24/2022:  He feels that his hands are continuing to get weaker and often drops objects.  He denies any worsening numbness/tingling.  He also complains of chronic neck pain and stiffness.  He is not taking tizanidine, but does not recall why he stopped it.  Sometimes, he has shooting pain towards his head.  Balance is also poor.  No falls, walks unassisted.  Pain remains relatively controlled on gabapentin 1200mg  at bedtime.   HTN Bil Carpal tunnel releases 3 years ago had OT/PT and left elbow release HTN, A-fib, COPD PAIN:  PAIN:  Are you having pain? No, soreness NPRS scale: 0 Pain location: right neck Aggravating factors: reclined back;  after a lot of exertion (yardwork, playing golf) Relieving factors: lying down; sitting up straight    PRECAUTIONS: None "I can do what I feel comfortable doing."   WEIGHT BEARING RESTRICTIONS: No  FALLS:  Has patient fallen in last 6 months? No but balance is horrible (outside in yard or golf course)   OCCUPATION: retired ; enjoys golf can do 18 holes   PLOF: Independent  PATIENT GOALS: better hand strength, help with neck pain after exertion  OBJECTIVE:   DIAGNOSTIC FINDINGS:  CT on 09/28/2021 IMPRESSION: 1. No acute displaced fracture or traumatic listhesis of the cervical spine in a patient with cervicothoracic posterolateral surgical hardware that is only partially visualized and originates at the C5 level. Persistent hardware lucency surrounding the and screws which is best evaluated on the coronal views and most prominent along the right C5 level. 2. Multilevel degenerative changes of the cervical spine with associated severe osseous neural foraminal stenosis of the non-surgerized levels. 3.  Emphysema (ICD10-J43.9).     NCS/EMG and US of the upper extremities 10/05/2021: There was electrodiagnostic and sonographic evidence of severe left median neuropathy at the wrist (carpal tunnel syndrome), left ulnar neuropathy with sonographic localization at the elbow as well as  a left subchronic C6-8 cervical radiculopathy. There was no electrodiagnostic evidence of a diffuse large fiber polyneuropathy. There appears to also be sonographic evidence consistent with right median neuropathy at the wrist, as may be seen in carpal tunnel syndrome. This was not fully explored on this study given the extensive nature of prior testing and clinical question asked, but could be explored further on a future study if clinically indicated. Clinical correlation advised.    NCS/EMG of the arms 04/01/2019 This is a complex study of the upper extremities.  Findings are as follows:  Bilateral median neuropathy at or distal to the wrist (severe), consistent with a clinical diagnosis of carpal tunnel  syndrome.   Bilateral ulnar neuropathy with slowing across the elbow, mild on the right and severe on the left. Chronic left posterior interosseous neuropathy, very severe.  Chronic C7-8 radiculopathy affecting bilateral upper extremities, moderate in degree electrically.   MRI cervical spine wo contrast 07/04/2018: 1. C5-T3 posterior-lateral fusion with decompressive laminectomy from C5-6 to T1-2. There is no arthrodesis by CT and the C5 and C6 lateral mass screws are loose. 2. Disc and facet degeneration causes multilevel foraminal impingement. Greatest foraminal impingement is seen on the left at C2-3, bilaterally at C3-4, right at C5-6, and right at T1-2. 3. Widely patent canal at levels of laminectomy. Noncompressive spinal stenosis seen at C3-4 and C4-5. 4. Septated fluid collection within the lower laminectomy defect without dural mass effect, best attributed to seroma.   MRI thoracic spine wo contrast 07/05/2018: Cervical and thoracic fusion and laminectomy. 15 mm fluid collection in the laminectomy bed. No cord compression or cord signal abnormality. Left paracentral disc protrusion T7-8 unchanged from the prior MRI PATIENT SURVEYS:  FOTO 54% 12/20: 54% 2/16:  69%  COGNITION: Overall cognitive status: Within functional limits for tasks assessed  SENSATION:  left 5th finger constantly numb, forearm 5-6 years bilaterally Noted atrophy left hypothenar eminence  POSTURE: rounded shoulders and forward head   Decreased cervical and flexor and extensor strength 4-/5 12/20:  cervical and flexor and extensor strength 4/5 2/16:  cervical and flexor and extensor strength 4+/5  CERVICAL ROM:   Active ROM A/PROM (deg) eval 11/15 12/20 1/17 2/21  Flexion 34 36 32 35 40  Extension 22 40 34 35 30  Right lateral flexion 25 31 30 30 27   Left lateral flexion 20 43 38 40 40  Right rotation 40 55 55  58  Left rotation 20 50 50  48   (Blank rows = not tested)  UPPER EXTREMITY ROM:   grossly WFLS  UPPER EXTREMITY MMT:  MMT Right eval Left eval 11/22 12/20 2/21  Shoulder flexion 4+ 4- Left 4 4+ 5-  Shoulder extension       Shoulder abduction 4+ 4- Left 4 4+ 5-  Shoulder adduction       Shoulder extension       Shoulder internal rotation       Shoulder external rotation       Middle trapezius 4 4-  4 4+  Lower trapezius 4 4-  4 4+  Elbow flexion 5 4     Elbow extension 4 2+  4 4+  Wrist flexion  2+  Left 3- 3+  Wrist extension  2+  Left 3- 3+  Wrist ulnar deviation       Wrist radial deviation       Wrist pronation       Wrist supination       Grip  strength 33 24  25 left; 46 right  28 left; 48 right    (Blank rows = not tested)  Pinch strength right 15#, left 5# 2/21:  Right 19#;  left 4#  CERVICAL SPECIAL TESTS:  Upper limb tension test (ULTT): Negative and Distraction test: Positive  (seated gentle) No symptoms produced or changed but neural tension bil   TODAY'S TREATMENT:   DATE: 12/26/22 UBE 5 min while discussing status Mountain climbers Ues on chair 10x Yellow loop oscillations with elevation 10x Push ups on railing 10x Green band with 5# dumbbell pronation and supination 10x each side Finger web for finger opposition right /left Manual therapy: soft tissue mobilization to right levator scap, infraspinatus and supraspinatus Trigger Point Dry-Needling  Treatment instructions: Expect mild to moderate muscle soreness. S/S of pneumothorax if dry needled over a lung field, and to seek immediate medical attention should they occur. Patient verbalized understanding of these instructions and education.  Patient Consent Given: Yes Education handout provided: Previously provided Muscles treated: right right levator scap, infraspinatus and supraspinatus Electrical stimulation performed: No Parameters: N/A Treatment response/outcome: improved soft tissue mobility and tender points       DATE: 12/20/22 UBE 5 min while discussing status 3#  forward, lateral and scaption raises 8x each way 30# barbell dead lifts 20x Chair push ups with min LE assist 15x Yellow loop with 5# weight supination 15x right/left Yellow loop with 5# around wrist with 5# wrist extension Pink squish ball pinch and twisting Chair push ups 10x (challenging) WB on Ues on counter shoulder taps 10x Red power loop with 5# weight pronation right /left 15x Lat bar 40# 20x blue band with handle: diagonal flexion 15x right/left   Therapeutic activities:  gripping, twisting, lifting, pushing, pulling        DATE: 12/13/22 UBE 5 min while discussing status FOTO Cervical ROM UE MMT Lean on hands on wall modified bird dogs 10x Lat bar 40# 20x blue band with handle: golf swings 15x right/left  Triceps 20# 2x10 Seated push up bars 15x (feet assist) 5# green band wrist extension and flexion combo with radial deviation 2x 10 right/left  5# dumbbell with green band attached for resisted pronation and supination left/right 2x10  Therapeutic activities:  gripping, twisting, lifting, pushing, pulling      PATIENT EDUCATION:  Education details: plan of care  Person educated: Patient Education method: Explanation Education comprehension: verbalized understanding  HOME EXERCISE PROGRAM: Access Code: [email or web address] URL: [email or web address] Date: 11/29/2022 Prepared by: Ruben Im  Exercises - Seated Upper Trapezius Stretch  - 1 x daily - 7 x weekly - 1 sets - 2-3 reps - 30 hold - Seated Cervical Flexion AROM  - 1 x daily - 7 x weekly - 1 sets - 2-3 reps - 30 hold - Seated Scapular Retraction  - 1 x daily - 7 x weekly - 1 sets - 10 reps - Supine Scapular Retraction  - 1 x daily - 7 x weekly - 1 sets - 10 reps - Seated Isometric Cervical Flexion  - 1 x daily - 7 x weekly - 1 sets - 5 reps - 5 hold - Seated Isometric Cervical Extension  - 1 x daily - 7 x weekly - 1 sets - 5 reps - 5 hold - Seated Isometric Cervical Sidebending  - 1 x daily - 7  x weekly - 1 sets - 5 reps - 5 hold - Seated Isometric Cervical Rotation  - 1 x daily - 7 x weekly -  1 sets - 5 reps - 5 hold - Median Nerve Flossing  - 1 x daily - 7 x weekly - 1 sets - 10 reps - Standing Row with Anchored Resistance  - 1 x daily - 7 x weekly - 3 sets - 10 reps - Shoulder Extension with Resistance Hands Down  - 1 x daily - 7 x weekly - 3 sets - 10 reps - Putty Squeezes  - 1 x daily - 7 x weekly - 3 sets - 10 reps - Tip Pinch with Putty  - 1 x daily - 7 x weekly - 3 sets - 10 reps - Key Pinch with Putty  - 1 x daily - 7 x weekly - 3 sets - 10 reps - 3-Point Pinch with Putty  - 1 x daily - 7 x weekly - 1 sets - 10 reps - Finger Extension with Putty  - 1 x daily - 7 x weekly - 1 sets - 10 reps - Finger Lumbricals with Putty  - 1 x daily - 7 x weekly - 1 sets - 10 reps - Thumb Opposition with Putty  - 1 x daily - 7 x weekly - 1 sets - 10 reps - Finger Pinch and Pull with Putty  - 1 x daily - 7 x weekly - 1 sets - 10 reps - Seated Finger Composite Flexion with Putty  - 1 x daily - 7 x weekly - 1 sets - 10 reps - Seated Claw Fist with Putty  - 1 x daily - 7 x weekly - 1 sets - 10 reps - Seated Finger MP Flexion with Putty  - 1 x daily - 7 x weekly - 1 sets - 10 reps - Quadruped Forward Weight Shift  - 1 x daily - 7 x weekly - 1 sets - 10 reps - Quadruped Side to Side Weight Shifts  - 1 x daily - 7 x weekly - 1 sets - 10 reps - Quadruped Circle Weight Shifts  - 1 x daily - 7 x weekly - 1 sets - 10 reps - Modified Bird Dog At Wall  - 1 x daily - 7 x weekly - 1 sets - 10 reps  ASSESSMENT:  CLINICAL IMPRESSION: Patient is able to continue with proximal and distal upper quarter strengthening.  Right posterior shoulder discomfort with extension and internal rotation.  Performed DN and manual therapy to address tender points with a positive response and release of tender point.  Therapist providing cues to optimize ex benefit and to monitor response.   OBJECTIVE IMPAIRMENTS: decreased  activity tolerance, impaired perceived functional ability, impaired flexibility, impaired sensation, impaired UE functional use, postural dysfunction, and pain.   ACTIVITY LIMITATIONS: carrying, lifting, sitting, standing, and reach over head  PARTICIPATION LIMITATIONS: interpersonal relationship, community activity, and yard work  PERSONAL FACTORS: Past/current experiences, Time since onset of injury/illness/exacerbation, and 3+ comorbidities: multi regions affected, HTN, A-fib, COPD  are also affecting patient's functional outcome.   REHAB POTENTIAL: Good  CLINICAL DECISION MAKING: Evolving/moderate complexity  EVALUATION COMPLEXITY: Moderate   GOALS: Goals reviewed with patient? Yes  SHORT TERM GOALS: Target date: 09/15/2022   The patient will demonstrate knowledge of basic self care strategies and exercises to promote healing   Baseline:  Goal status: goal met 11/22 2.  The patient will report a 30% improvement in pain levels with functional activities   Baseline:  Goal status: ongoing  3.  Cervical flexors and extensors improved to 4/5 for improved tolerance with  reclined sitting Baseline:  Goal status: goal met 11/22  4.  Shoulder/scapular strength improved to 4/5 needed for lifting/carrying medium objects for yardwork and traveling Baseline:  Goal status: goal met 11/22    LONG TERM GOALS: Target date: 02/02/2023    The patient will be independent in a safe self progression of a home exercise program to promote further recovery of function   Baseline:  Goal status: ongoing  2.  The patient will report a 60% improvement in pain levels with functional activities  Baseline:   30% on 12/20;  2/21: 70% Goal status: met 2/21  3.  Cervical flexors and extensors improved to 4+/5 for improved tolerance with reclined sitting, playing golf Baseline:  Goal status: met 2/21  4.  Shoulder/scapular strength improved to 4+/5 needed for lifting/carrying medium to heavier  objects for yardwork and suitcases for traveling Baseline:  Goal status:met 2/21  5.  The patient will have improved FOTO score to   59%    indicating improved function with less pain  Baseline:  Goal status: met 2/21  6. Improved left grip strength to 30# for holding a fork and cutting things  revised  7. Left pinch strength to 6#  needed for gripping eating utensils new   PLAN:  PT FREQUENCY: 1x/week decreasing to biweekly  PT DURATION: 8 weeks  PLANNED INTERVENTIONS: Therapeutic exercises, Therapeutic activity, Neuromuscular re-education, Balance training, Gait training, Patient/Family education, Self Care, Joint mobilization, Aquatic Therapy, Dry Needling, Spinal mobilization, Cryotherapy, Moist heat, Taping, Traction, Ultrasound, Manual therapy, and Re-evaluation  PLAN FOR NEXT SESSION:  assess response to DN;  barbell from knee level;fine motor strengthening;  UE weight bearing;   green band with handle golf swing;  distal left UE strengthening especially left;  UBE and lat bar, counter push ups or quadruped; proximal UE strengthening  Ruben Im, PT 12/26/22 5:08 PM Phone: [phone] Fax: [phone]      PHYSICAL THERAPY DISCHARGE SUMMARY  Visits from Start of Care: 18  Current functional level related to goals / functional outcomes: Patient called and left message requesting discharge from PT.   Remaining deficits: As above   Education / Equipment: HEP   Patient agrees to discharge. Patient goals were partially met. Patient is being discharged due to the patient's request.  Ruben Im, PT 01/23/23 8:01 AM Phone: [phone] Fax: [phone]

## 2022-12-30 ENCOUNTER — Other Ambulatory Visit: Payer: Self-pay | Admitting: Acute Care

## 2022-12-30 DIAGNOSIS — Z87891 Personal history of nicotine dependence: Secondary | ICD-10-CM

## 2022-12-30 DIAGNOSIS — Z122 Encounter for screening for malignant neoplasm of respiratory organs: Secondary | ICD-10-CM

## 2022-12-30 DIAGNOSIS — F17210 Nicotine dependence, cigarettes, uncomplicated: Secondary | ICD-10-CM

## 2023-01-02 DIAGNOSIS — Z6828 Body mass index (BMI) 28.0-28.9, adult: Secondary | ICD-10-CM | POA: Diagnosis not present

## 2023-01-02 DIAGNOSIS — Z23 Encounter for immunization: Secondary | ICD-10-CM | POA: Diagnosis not present

## 2023-01-02 DIAGNOSIS — Z0000 Encounter for general adult medical examination without abnormal findings: Secondary | ICD-10-CM | POA: Diagnosis not present

## 2023-01-02 DIAGNOSIS — Z1389 Encounter for screening for other disorder: Secondary | ICD-10-CM | POA: Diagnosis not present

## 2023-01-03 ENCOUNTER — Encounter: Payer: Medicare Other | Admitting: Physical Therapy

## 2023-01-09 DIAGNOSIS — F17200 Nicotine dependence, unspecified, uncomplicated: Secondary | ICD-10-CM | POA: Diagnosis not present

## 2023-01-09 DIAGNOSIS — J439 Emphysema, unspecified: Secondary | ICD-10-CM | POA: Diagnosis not present

## 2023-01-09 DIAGNOSIS — E559 Vitamin D deficiency, unspecified: Secondary | ICD-10-CM | POA: Diagnosis not present

## 2023-01-09 DIAGNOSIS — D6869 Other thrombophilia: Secondary | ICD-10-CM | POA: Diagnosis not present

## 2023-01-09 DIAGNOSIS — E790 Hyperuricemia without signs of inflammatory arthritis and tophaceous disease: Secondary | ICD-10-CM | POA: Diagnosis not present

## 2023-01-09 DIAGNOSIS — D72829 Elevated white blood cell count, unspecified: Secondary | ICD-10-CM | POA: Diagnosis not present

## 2023-01-09 DIAGNOSIS — G629 Polyneuropathy, unspecified: Secondary | ICD-10-CM | POA: Diagnosis not present

## 2023-01-09 DIAGNOSIS — I700 Atherosclerosis of aorta: Secondary | ICD-10-CM | POA: Diagnosis not present

## 2023-01-09 DIAGNOSIS — Z125 Encounter for screening for malignant neoplasm of prostate: Secondary | ICD-10-CM | POA: Diagnosis not present

## 2023-01-09 DIAGNOSIS — I480 Paroxysmal atrial fibrillation: Secondary | ICD-10-CM | POA: Diagnosis not present

## 2023-01-09 DIAGNOSIS — I10 Essential (primary) hypertension: Secondary | ICD-10-CM | POA: Diagnosis not present

## 2023-01-09 DIAGNOSIS — G4719 Other hypersomnia: Secondary | ICD-10-CM | POA: Diagnosis not present

## 2023-01-09 DIAGNOSIS — E7800 Pure hypercholesterolemia, unspecified: Secondary | ICD-10-CM | POA: Diagnosis not present

## 2023-01-12 ENCOUNTER — Ambulatory Visit: Payer: Medicare Other | Admitting: Physical Therapy

## 2023-01-20 ENCOUNTER — Other Ambulatory Visit: Payer: Self-pay | Admitting: Neurology

## 2023-01-23 ENCOUNTER — Encounter: Payer: Medicare Other | Admitting: Physical Therapy

## 2023-01-23 DIAGNOSIS — G4733 Obstructive sleep apnea (adult) (pediatric): Secondary | ICD-10-CM | POA: Diagnosis not present

## 2023-01-23 DIAGNOSIS — E782 Mixed hyperlipidemia: Secondary | ICD-10-CM | POA: Diagnosis not present

## 2023-01-23 DIAGNOSIS — I10 Essential (primary) hypertension: Secondary | ICD-10-CM | POA: Diagnosis not present

## 2023-02-06 ENCOUNTER — Encounter: Payer: Medicare Other | Admitting: Physical Therapy

## 2023-02-20 ENCOUNTER — Ambulatory Visit
Admission: RE | Admit: 2023-02-20 | Discharge: 2023-02-20 | Disposition: A | Discharge status: Home / Self Care | Payer: Medicare Other | Source: Ambulatory Visit | Attending: Acute Care | Admitting: Acute Care

## 2023-02-20 ENCOUNTER — Other Ambulatory Visit: Payer: Self-pay | Admitting: Cardiology

## 2023-02-20 DIAGNOSIS — F17210 Nicotine dependence, cigarettes, uncomplicated: Secondary | ICD-10-CM

## 2023-02-20 DIAGNOSIS — J439 Emphysema, unspecified: Secondary | ICD-10-CM | POA: Diagnosis not present

## 2023-02-20 DIAGNOSIS — F17200 Nicotine dependence, unspecified, uncomplicated: Secondary | ICD-10-CM | POA: Diagnosis not present

## 2023-02-20 DIAGNOSIS — Z122 Encounter for screening for malignant neoplasm of respiratory organs: Secondary | ICD-10-CM | POA: Diagnosis not present

## 2023-02-20 DIAGNOSIS — I480 Paroxysmal atrial fibrillation: Secondary | ICD-10-CM

## 2023-02-20 DIAGNOSIS — I700 Atherosclerosis of aorta: Secondary | ICD-10-CM | POA: Diagnosis not present

## 2023-02-20 DIAGNOSIS — Z87891 Personal history of nicotine dependence: Secondary | ICD-10-CM

## 2023-02-23 ENCOUNTER — Other Ambulatory Visit: Payer: Self-pay

## 2023-02-23 ENCOUNTER — Ambulatory Visit: Payer: Medicare Other | Admitting: Cardiology

## 2023-02-23 DIAGNOSIS — F17210 Nicotine dependence, cigarettes, uncomplicated: Secondary | ICD-10-CM

## 2023-02-23 DIAGNOSIS — Z122 Encounter for screening for malignant neoplasm of respiratory organs: Secondary | ICD-10-CM

## 2023-02-23 DIAGNOSIS — Z87891 Personal history of nicotine dependence: Secondary | ICD-10-CM

## 2023-02-28 ENCOUNTER — Encounter: Payer: Self-pay | Admitting: Cardiology

## 2023-02-28 ENCOUNTER — Ambulatory Visit: Payer: Medicare Other | Admitting: Cardiology

## 2023-02-28 VITALS — BP 118/60 | HR 83 | Ht 70.5 in | Wt 194.0 lb

## 2023-02-28 DIAGNOSIS — I480 Paroxysmal atrial fibrillation: Secondary | ICD-10-CM

## 2023-02-28 DIAGNOSIS — I2510 Atherosclerotic heart disease of native coronary artery without angina pectoris: Secondary | ICD-10-CM

## 2023-02-28 MED ORDER — MULTAQ 400 MG PO TABS: 400.0000 mg | ORAL_TABLET | Freq: Two times a day (BID) | ORAL | 3 refills | Status: DC

## 2023-02-28 NOTE — Progress Notes (Signed)
Follow up visit  Subjective:   Richard Lynch, male    DOB: Sep 24, 1951, 72 y.o.   MRN: [id number]    HPI  Chief Complaint  Patient presents with   Coronary artery disease involving native coronary artery of   Follow-up    72 y/o Caucasian male with hypertension, hyperlipidemia, CAD, PAD,  paroxysmal A-fib, OSA on CPAP, CIDP  Patient denies chest pain, shortness of breath, palpitations, leg edema, orthopnea, PND, TIA/syncope. However, he fells like he has aged a lot in last 9 months, fees tired. He has not been able to sleep well, tosses and turns. This has persisted in spite of following good sleep hygiene.       Current Outpatient Medications:    allopurinol (ZYLOPRIM) 100 MG tablet, Take 200 mg by mouth daily., Disp: , Rfl:    amphetamine-dextroamphetamine (ADDERALL) 20 MG tablet, Take 20-30 mg by mouth daily as needed (sleepiness)., Disp: , Rfl:    Ascorbic Acid (VITAMIN C PO), Take 600 mg by mouth daily., Disp: , Rfl:    aspirin EC 81 MG tablet, Take 1 tablet (81 mg total) by mouth daily., Disp: 90 tablet, Rfl: 3   atorvastatin (LIPITOR) 40 MG tablet, Take 40 mg by mouth daily., Disp: , Rfl:    B Complex-C (SUPER B COMPLEX PO), Take 1 tablet by mouth daily., Disp: , Rfl:    candesartan (ATACAND) 32 MG tablet, Take 32 mg by mouth daily. , Disp: , Rfl:    chlorthalidone (HYGROTON) 25 MG tablet, TAKE 1 TABLET (25 MG TOTAL) BY MOUTH DAILY., Disp: 90 tablet, Rfl: 1   Cholecalciferol (VITAMIN D3) 50 MCG (2000 UT) capsule, Take 2,000 Units by mouth daily., Disp: , Rfl:    Coenzyme Q10 (COQ-10 PO), Take 100-300 mg by mouth daily., Disp: , Rfl:    Cyanocobalamin (VITAMIN B-12) 5000 MCG SUBL, Place 10,000 mcg under the tongue daily., Disp: , Rfl:    diltiazem (CARDIZEM) 30 MG tablet, Take 1 tablet every 4 hours AS NEEDED for AFIB heart rate >100, Disp: 45 tablet, Rfl: 1   dronedarone (MULTAQ) 400 MG tablet, Take 1 tablet (400 mg total) by mouth 2 (two) times daily with a meal., Disp:  180 tablet, Rfl: 3   ELIQUIS 5 MG TABS tablet, TAKE 1 TABLET (5 MG TOTAL) BY MOUTH 2 (TWO) TIMES DAILY. RESUME 04/12/2022 EVENING DOSE, Disp: 60 tablet, Rfl: 0   gabapentin (NEURONTIN) 300 MG capsule, Take 4 capsules (1,200 mg total) by mouth See admin instructions., Disp: 360 capsule, Rfl: 3   isosorbide mononitrate (IMDUR) 30 MG 24 hr tablet, Take 1 tablet (30 mg total) by mouth daily., Disp: 90 tablet, Rfl: 3   metoprolol succinate (TOPROL-XL) 50 MG 24 hr tablet, Take 1 tablet (50 mg total) by mouth daily. Take with or immediately following a meal., Disp: 90 tablet, Rfl: 3   Multiple Vitamin (MULTIVITAMIN WITH MINERALS) TABS tablet, Take 1 tablet by mouth daily. Centrum silver, Disp: , Rfl:    nicotine (NICODERM CQ - DOSED IN MG/24 HOURS) 14 mg/24hr patch, Place 1 patch (14 mg total) onto the skin daily., Disp: 28 patch, Rfl: 3   SYMBICORT 160-4.5 MCG/ACT inhaler, Inhale 2 puffs into the lungs daily as needed (lungs)., Disp: , Rfl:    tiZANidine (ZANAFLEX) 2 MG tablet, TAKE 1 TO 2 TABLETS (2-4 MG TOTAL) BY MOUTH AT BEDTIME AS NEEDED FOR MUSCLE SPASM, Disp: 180 tablet, Rfl: 1   Turmeric (QC TUMERIC COMPLEX PO), Take 1,500 mg by mouth daily.,  Disp: , Rfl:    valACYclovir (VALTREX) 500 MG tablet, Take 500 mg by mouth daily., Disp: , Rfl:    Zinc Sulfate (ZINC 15 PO), Take 50 mg by mouth daily., Disp: , Rfl:    Cardiovascular & other pertient studies:  Reviewed external labs and tests, independently interpreted  EKG 04/11/2022: Sinus rhythm 72 bpm Normal EKG  Coronary angiography 04/11/2022: LM: Normal LAD: Tortious vessel          Ostial 20% stenosis with mild calcification Lcx: Tortuous vessel. No significant disease RCA: Large, dominant, tortuous vessel          80% proximal stenosis with adjacent aneurysmal areas           60% distal stenosis   Normal LVEDP   Single vessel obstructive disease Symptoms of decreased exercise tolerance could be due to combination of CAD as well as  PAF Recommend aggressive medical therapy for both Added Imdur 30 mg and Multaq 400 mg bid  If no symptom improvement, recommend PCI to prox-mid RCA, followed by triple therapy for 1 month (ASA, plavix, eliquis), then Aspirin/plavix + eliquis  Exercise Myoview stress test 02/15/2022: Exercise nuclear stress test was performed using Bruce protocol.   1 Day Rest and Stress images. Exercise time 8 minutes 30 seconds, achieved 9.09 METS, 95% APMHR.  No chest pain during exercise. Hypertensive response to exercise (resting BP 122/78, peak BP 230/78). Stress ECG equivocal due to EKG changes (2mm upsloping ST depression inferolateral leads which recovers 3 minutes into recovery, PAC/PVC).  Medium size, mild intensity, reversible perfusion defect involving basal to distal inferior segments suggestive of possible ischemia in the RCA distribution. Left ventricular size normal, LVEF 58%, no regional wall motion abnormalities. Intermediate risk study, clinical correlation required.    Recent labs: 01/09/2023: Glucose 89, BUN/Cr 18/1.1. EGFR 71. K 4.0. Hb 13.3 HbA1C 5.5% Chol 122, TG 145, HDL 44, LDL 53 TSH 1.2 normal   Review of Systems  Constitutional: Positive for malaise/fatigue.  Cardiovascular:  Negative for chest pain, dyspnea on exertion, leg swelling, palpitations and syncope.         Vitals:   02/28/23 1152  BP: 118/60  Pulse: 83  SpO2: 93%      Body mass index is 27.44 kg/m. Filed Weights   02/28/23 1152  Weight: 194 lb (88 kg)      Objective:   Physical Exam Vitals and nursing note reviewed.  Constitutional:      General: He is not in acute distress. Neck:     Vascular: No JVD.  Cardiovascular:     Rate and Rhythm: Normal rate and regular rhythm.     Heart sounds: Normal heart sounds. No murmur heard. Pulmonary:     Effort: Pulmonary effort is normal.     Breath sounds: Normal breath sounds. No wheezing or rales.  Musculoskeletal:     Right lower leg: No  edema.     Left lower leg: No edema.             Visit diagnoses:   ICD-10-CM   1. Coronary artery disease involving native coronary artery of native heart without angina pectoris  I25.10 EKG 12-Lead    2. Paroxysmal atrial fibrillation (HCC)  I48.0 dronedarone (MULTAQ) 400 MG tablet         Meds ordered this encounter  Medications   dronedarone (MULTAQ) 400 MG tablet    Sig: Take 1 tablet (400 mg total) by mouth 2 (two) times daily with a meal.  Dispense:  180 tablet    Refill:  3     Assessment & Recommendations:   72 y/o Caucasian male with hypertension, hyperlipidemia, CAD, PAD,  paroxysmal A-fib, OSA on CPAP, CIDP  Coronary artery disease, Single-vessel disease with severe mid RCA stenosis with adjacent ectasia. Good exercise capacity without chest pain, but areas of medium size mild ischemia in distal RCA territory noted on stress testing 01/2022. Currently, he has no specific chest pain symptoms.  With rhythm control of A-fib with Multaq, he has not had any significant decreased exercise tolerance symptoms either.  Continue current anti anginal therapy and AAD. Continue aspirin 81 mg daily, Imdur 30 mg daily, metoprolol succinate 50 mg daily, Multaq 400 mg twice daily,  PAF: No recurrence of A-fib episode, as monitored by patient's Apple Watch, since being on Multaq 400 mg daily. CHA2DS2VAS score 3, annual stroke risk 3.2% Continue Eliquis 5 mg twice daily Refilled multaq today.  Fatigue: Suspect this is related to poor sleep. Consider CPAP adjustment. Consider melatonin/trazodone. Defer to PCP,  Nicotine dependence: Recommend smoking cessation.  F/u in 6 months    Elder Negus, MD Pager: [phone] Office: [phone]

## 2023-03-16 ENCOUNTER — Telehealth: Payer: Self-pay

## 2023-03-16 NOTE — Telephone Encounter (Signed)
Recommend taking 1/2 tab of metoprolol succinate 50 mg daily. If he tolerates that well, can prescribed 25 mg tab to be taken once daily in future  Thanks MJP

## 2023-03-16 NOTE — Telephone Encounter (Signed)
Called patient to inform him about the message above. Patient understood

## 2023-03-16 NOTE — Telephone Encounter (Signed)
Patient called asking if he can cut his Metoprolol in half because it makes him dizzy after taking a whole tablet. If yes, do you want him to take just 1/2 a tablet, or 1/2 a tablet two times a day. Please advise.

## 2023-04-10 ENCOUNTER — Other Ambulatory Visit: Payer: Self-pay | Admitting: Neurology

## 2023-04-17 DIAGNOSIS — R051 Acute cough: Secondary | ICD-10-CM | POA: Diagnosis not present

## 2023-04-17 DIAGNOSIS — H02423 Myogenic ptosis of bilateral eyelids: Secondary | ICD-10-CM | POA: Diagnosis not present

## 2023-04-17 DIAGNOSIS — H18593 Other hereditary corneal dystrophies, bilateral: Secondary | ICD-10-CM | POA: Diagnosis not present

## 2023-04-17 DIAGNOSIS — H04123 Dry eye syndrome of bilateral lacrimal glands: Secondary | ICD-10-CM | POA: Diagnosis not present

## 2023-04-17 DIAGNOSIS — H0102A Squamous blepharitis right eye, upper and lower eyelids: Secondary | ICD-10-CM | POA: Diagnosis not present

## 2023-04-17 DIAGNOSIS — H2511 Age-related nuclear cataract, right eye: Secondary | ICD-10-CM | POA: Diagnosis not present

## 2023-04-17 DIAGNOSIS — Z961 Presence of intraocular lens: Secondary | ICD-10-CM | POA: Diagnosis not present

## 2023-04-17 DIAGNOSIS — H0102B Squamous blepharitis left eye, upper and lower eyelids: Secondary | ICD-10-CM | POA: Diagnosis not present

## 2023-04-17 DIAGNOSIS — H47012 Ischemic optic neuropathy, left eye: Secondary | ICD-10-CM | POA: Diagnosis not present

## 2023-04-17 DIAGNOSIS — J189 Pneumonia, unspecified organism: Secondary | ICD-10-CM | POA: Diagnosis not present

## 2023-04-17 DIAGNOSIS — H43813 Vitreous degeneration, bilateral: Secondary | ICD-10-CM | POA: Diagnosis not present

## 2023-05-01 ENCOUNTER — Other Ambulatory Visit: Payer: Self-pay | Admitting: Cardiology

## 2023-05-01 DIAGNOSIS — I480 Paroxysmal atrial fibrillation: Secondary | ICD-10-CM

## 2023-05-07 ENCOUNTER — Encounter: Payer: Self-pay | Admitting: Neurology

## 2023-05-07 ENCOUNTER — Ambulatory Visit (INDEPENDENT_AMBULATORY_CARE_PROVIDER_SITE_OTHER): Payer: Medicare Other | Admitting: Neurology

## 2023-05-07 VITALS — BP 109/61 | HR 91 | Ht 70.5 in | Wt 187.0 lb

## 2023-05-07 DIAGNOSIS — M4802 Spinal stenosis, cervical region: Secondary | ICD-10-CM

## 2023-05-07 DIAGNOSIS — G5623 Lesion of ulnar nerve, bilateral upper limbs: Secondary | ICD-10-CM

## 2023-05-07 DIAGNOSIS — M5412 Radiculopathy, cervical region: Secondary | ICD-10-CM | POA: Diagnosis not present

## 2023-05-07 MED ORDER — GABAPENTIN 300 MG PO CAPS: 1200.0000 mg | ORAL_CAPSULE | ORAL | 3 refills | Status: DC

## 2023-05-07 NOTE — Patient Instructions (Addendum)
Continue to monitor your blood pressure at home and share with your cardiologist.  Their office will contact you to schedule a sooner appointment  Return to clinic in 1 year

## 2023-05-07 NOTE — Progress Notes (Signed)
Follow-up Visit   Date: 05/07/23   Richard Lynch MRN: [id number] DOB: Jan 15, 1951   Interim History: Richard Lynch is a 72 y.o. left-handed Caucasian male with hypertension, hyperlipidemia, and tobacco use returning to the clinic for follow-up of polyneuropathy.  The patient was accompanied to the clinic by self.  IMPRESSION/PLAN: Chronic right hand weakness and paresthesias due to overlapping C8 radiculopathy and entrapment neuropathies. Exam is stable. 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 He has been off IVIG since December 2021.   He has sought second opinion at [facility] and [facility], who agree that his hand paresthesias and weakness is mostly secondary to C8 radiculopathy with overlapping entrapment neuropathy.  He saw Neurosurgery at [facility] who advised that he is high risk for cervical surgery and recommended conservative therapies.  Unfortunately, I do not have any treatment options and management will be supportive For pain, continue, continue gabapentin 1200mg  at bedtime.  Muscle jerks are most likely side effect of gabapentin, they are no bothersome enough to lower the dose.   2. Low blood pressure, symptomatic.  He reports BP has been low at home and endorses fatigue and lightheadedness. He says metoprolol was reduced a few weeks ago, however with BP being this low, he may need further adjustment of antihypertensive medication. I have contacted his cardiologist whose office will call pt to arrange BP follow-up in the office tomorrow.  Return to clinic in 1 year --------------------------------------------------------------------------------------------  UPDATE 07/24/2022:  He feels that his hands are continuing to get weaker and often drops objects.  He denies any worsening numbness/tingling.  He also complains of chronic neck  pain and stiffness.  He is not taking tizanidine, but does not recall why he stopped it.  Sometimes, he has shooting pain towards his head.  Balance is also poor.  No falls, walks unassisted.  Pain remains relatively controlled on gabapentin 1200mg  at bedtime.    UPDATE 05/07/2023:  He is here for follow-up visit.  He reports pain is controlled on gabapentin 1200mg  at bedtime.  He has noticed episodic muscle jerks of the hands. He continues to be frustrated by the lack of treatable condition or anything that can prevent the weakness in the hands.  He has seen two neurosurgeons, but who did not feel surgery was an option.    He is also complains of lightheadedness and fatigue.  His BP is low in the office today 90/41 and rechecked 109/61.  He states home BP readings are also staying in 90s-100s.  Medications:  Current Outpatient Medications on File Prior to Visit  Medication Sig Dispense Refill   allopurinol (ZYLOPRIM) 100 MG tablet Take 200 mg by mouth daily.     amphetamine-dextroamphetamine (ADDERALL) 20 MG tablet Take 20-30 mg by mouth daily as needed (sleepiness).     Ascorbic Acid (VITAMIN C PO) Take 600 mg by mouth daily.     aspirin EC 81 MG tablet Take 1 tablet (81 mg total) by mouth daily. 90 tablet 3   atorvastatin (LIPITOR) 40 MG tablet Take 40 mg by mouth daily.     B Complex-C (SUPER B COMPLEX PO) Take 1 tablet by mouth daily.     candesartan (ATACAND) 32 MG tablet Take 32 mg by mouth daily.      chlorthalidone (HYGROTON) 25 MG tablet TAKE 1 TABLET (25 MG TOTAL) BY MOUTH DAILY. 90  tablet 1   Cholecalciferol (VITAMIN D3) 50 MCG (2000 UT) capsule Take 2,000 Units by mouth daily.     Coenzyme Q10 (COQ-10 PO) Take 100-300 mg by mouth daily.     Cyanocobalamin (VITAMIN B-12) 5000 MCG SUBL Place 10,000 mcg under the tongue daily.     diltiazem (CARDIZEM) 30 MG tablet Take 1 tablet every 4 hours AS NEEDED for AFIB heart rate >100 45 tablet 1   dronedarone (MULTAQ) 400 MG tablet Take 1  tablet (400 mg total) by mouth 2 (two) times daily with a meal. 180 tablet 3   ELIQUIS 5 MG TABS tablet TAKE 1 TABLET (5 MG TOTAL) BY MOUTH 2 (TWO) TIMES DAILY. RESUME 04/12/2022 EVENING DOSE 60 tablet 0   gabapentin (NEURONTIN) 300 MG capsule Take 4 capsules (1,200 mg total) by mouth See admin instructions. (Patient taking differently: Take 1,200 mg by mouth See admin instructions. 4 capsules at bedtime) 360 capsule 3   isosorbide mononitrate (IMDUR) 30 MG 24 hr tablet Take 1 tablet (30 mg total) by mouth daily. 90 tablet 3   metoprolol succinate (TOPROL-XL) 50 MG 24 hr tablet Take 1 tablet (50 mg total) by mouth daily. Take with or immediately following a meal. (Patient taking differently: Take 25 mg by mouth daily. Take half tablet daily Take with or immediately following a meal.) 90 tablet 3   Multiple Vitamin (MULTIVITAMIN WITH MINERALS) TABS tablet Take 1 tablet by mouth daily. Centrum silver     nicotine (NICODERM CQ - DOSED IN MG/24 HOURS) 14 mg/24hr patch Place 1 patch (14 mg total) onto the skin daily. 28 patch 3   SYMBICORT 160-4.5 MCG/ACT inhaler Inhale 2 puffs into the lungs daily as needed (lungs).     Turmeric (QC TUMERIC COMPLEX PO) Take 1,500 mg by mouth daily.     valACYclovir (VALTREX) 500 MG tablet Take 500 mg by mouth daily.     Zinc Sulfate (ZINC 15 PO) Take 50 mg by mouth daily.     No current facility-administered medications on file prior to visit.    Allergies: No Known Allergies   Vital Signs:  BP 109/61   Pulse 91   Ht 5' 10.5" (1.791 m)   Wt 187 lb (84.8 kg)   SpO2 95%   BMI 26.45 kg/m     Neurological Exam: MENTAL STATUS including orientation to time, place, person, recent and remote memory, attention span and concentration, language, and fund of knowledge is normal.  Speech is not dysarthric.  CRANIAL NERVES: Face is symmetric.  No ptosis.  MOTOR: Marked atrophy of the left intrinsic hand muscles and forearm. Left claw hand deformity. No  fasciculations  or abnormal movements.    Upper Extremity:  Right  Left  Deltoid  5/5   5/5   Biceps  5/5   5/5   Triceps  5/5   5/5   Infraspinatus 5/5  5/5  Medial pectoralis 5/5  5/5  Wrist extensors  5/5   5/5   Wrist flexors  5/5   5/5   Finger extensors  5/5   2+/5   Finger flexors  5/5   5-/5   Dorsal interossei  5/5   3+/5   Abductor pollicis  5/5   1-/5   Tone (Ashworth scale)  0  0   Lower extremities 5/5 throughout   MSRs:  Right        Left brachioradialis 2+  2+  biceps 2+  2+  triceps 2+  2+  patellar 1+  2+  ankle jerk 0  0   SENSORY:  Vibration is reduced to at the ankles. Temperature remains reduced over the palms/fingers.  COORDINATION/GAIT: Gait narrow based and stable  Data: DATA: NCS/EMG of the legs 04/17/2019: The electrophysiologic findings are consistent with a sensorimotor polyneuropathy, demyelinating and axonal loss in type affecting the lower extremities.  The presence of sural-sparing suggests findings may be associated with a polyradiculoneuropathy, correlate clinically.  NCS/EMG of the arms 04/01/2019 This is a complex study of the upper extremities.  Findings are as follows:  Bilateral median neuropathy at or distal to the wrist (severe), consistent with a clinical diagnosis of carpal tunnel syndrome.   Bilateral ulnar neuropathy with slowing across the elbow, mild on the right and severe on the left. Chronic left posterior interosseous neuropathy, very severe.  Chronic C7-8 radiculopathy affecting bilateral upper extremities, moderate in degree electrically.  MRI cervical spine wo contrast 07/04/2018: 1. C5-T3 posterior-lateral fusion with decompressive laminectomy from C5-6 to T1-2. There is no arthrodesis by CT and the C5 and C6 lateral mass screws are loose. 2. Disc and facet degeneration causes multilevel foraminal impingement. Greatest foraminal impingement is seen on the left at C2-3, bilaterally at C3-4, right  at C5-6, and right at T1-2. 3. Widely patent canal at levels of laminectomy. Noncompressive spinal stenosis seen at C3-4 and C4-5. 4. Septated fluid collection within the lower laminectomy defect without dural mass effect, best attributed to seroma.  MRI thoracic spine wo contrast 07/05/2018: Cervical and thoracic fusion and laminectomy. 15 mm fluid collection in the laminectomy bed. No cord compression or cord signal abnormality. Left paracentral disc protrusion T7-8 unchanged from the prior MRI.  CSF testing 05/08/2019:   W0 R1 G73 P105*, 2 oligoclonal bands, normal IgG index, MBP and ACE  Labs 04/17/2019: ESR 6, vitamin B1 28, TSH 1.26, copper 106, SPEP with IFE no M protein, vitamin B12 >2000, folate 19.6  NCS/EMG and US of the upper extremities 10/05/2021: There was electrodiagnostic and sonographic evidence of severe left median neuropathy at the wrist (carpal tunnel syndrome), left ulnar neuropathy with sonographic localization at the elbow as well as a left subchronic C6-8 cervical radiculopathy. There was no electrodiagnostic evidence of a diffuse large fiber polyneuropathy. There appears to also be sonographic evidence consistent with right median neuropathy at the wrist, as may be seen in carpal tunnel syndrome. This was not fully explored on this study given the extensive nature of prior testing and clinical question asked, but could be explored further on a future study if clinically indicated. Clinical correlation advised.    Total time spent reviewing records, interview, history/exam, documentation, and coordination of care on day of encounter:  30 min   Thank you for allowing me to participate in patient's care.  If I can answer any additional questions, I would be pleased to do so.    Sincerely,    Deetya Drouillard K. Allena Katz, DO

## 2023-05-08 ENCOUNTER — Ambulatory Visit: Payer: Medicare Other | Admitting: Cardiology

## 2023-05-08 ENCOUNTER — Encounter: Payer: Self-pay | Admitting: Cardiology

## 2023-05-08 VITALS — BP 123/65 | HR 89 | Ht 70.5 in | Wt 189.0 lb

## 2023-05-08 DIAGNOSIS — I2510 Atherosclerotic heart disease of native coronary artery without angina pectoris: Secondary | ICD-10-CM | POA: Diagnosis not present

## 2023-05-08 DIAGNOSIS — R42 Dizziness and giddiness: Secondary | ICD-10-CM | POA: Insufficient documentation

## 2023-05-08 DIAGNOSIS — I480 Paroxysmal atrial fibrillation: Secondary | ICD-10-CM | POA: Diagnosis not present

## 2023-05-08 NOTE — Progress Notes (Signed)
Follow up visit  Subjective:   Richard Lynch, male    DOB: June 28, 1951, 72 y.o.   MRN: [id number]    HPI  Chief Complaint  Patient presents with   Dizziness   Hypotension    72 y/o Caucasian male with hypertension, hyperlipidemia, CAD, PAD,  paroxysmal A-fib, OSA on CPAP, CIDP  Patient has been feeling lightheaded for the last few days. He has also noticed that his blood pressure is running lower than usual, lowest SBP noted by him being 90 mmHg. He was seen by his Neurologist Dr. Allena Katz yesterday and was found to have unequal blood pressure in both arms. Therefore, today's appt was made. He denies any chest pain, chest pressure, shortness of breath, decreased exercise tolerance symptoms.      Current Outpatient Medications:    allopurinol (ZYLOPRIM) 100 MG tablet, Take 200 mg by mouth daily., Disp: , Rfl:    amphetamine-dextroamphetamine (ADDERALL) 20 MG tablet, Take 20-30 mg by mouth daily as needed (sleepiness)., Disp: , Rfl:    Ascorbic Acid (VITAMIN C PO), Take 600 mg by mouth daily., Disp: , Rfl:    aspirin EC 81 MG tablet, Take 1 tablet (81 mg total) by mouth daily., Disp: 90 tablet, Rfl: 3   atorvastatin (LIPITOR) 40 MG tablet, Take 40 mg by mouth daily., Disp: , Rfl:    B Complex-C (SUPER B COMPLEX PO), Take 1 tablet by mouth daily., Disp: , Rfl:    candesartan (ATACAND) 32 MG tablet, Take 32 mg by mouth daily. , Disp: , Rfl:    chlorthalidone (HYGROTON) 25 MG tablet, TAKE 1 TABLET (25 MG TOTAL) BY MOUTH DAILY., Disp: 90 tablet, Rfl: 1   Cholecalciferol (VITAMIN D3) 50 MCG (2000 UT) capsule, Take 2,000 Units by mouth daily., Disp: , Rfl:    Coenzyme Q10 (COQ-10 PO), Take 100-300 mg by mouth daily., Disp: , Rfl:    Cyanocobalamin (VITAMIN B-12) 5000 MCG SUBL, Place 10,000 mcg under the tongue daily., Disp: , Rfl:    diltiazem (CARDIZEM) 30 MG tablet, Take 1 tablet every 4 hours AS NEEDED for AFIB heart rate >100, Disp: 45 tablet, Rfl: 1   dronedarone (MULTAQ) 400 MG  tablet, Take 1 tablet (400 mg total) by mouth 2 (two) times daily with a meal., Disp: 180 tablet, Rfl: 3   ELIQUIS 5 MG TABS tablet, TAKE 1 TABLET (5 MG TOTAL) BY MOUTH 2 (TWO) TIMES DAILY. RESUME 04/12/2022 EVENING DOSE, Disp: 60 tablet, Rfl: 0   gabapentin (NEURONTIN) 300 MG capsule, Take 4 capsules (1,200 mg total) by mouth See admin instructions., Disp: 360 capsule, Rfl: 3   isosorbide mononitrate (IMDUR) 30 MG 24 hr tablet, Take 1 tablet (30 mg total) by mouth daily., Disp: 90 tablet, Rfl: 3   metoprolol succinate (TOPROL-XL) 50 MG 24 hr tablet, Take 1 tablet (50 mg total) by mouth daily. Take with or immediately following a meal. (Patient taking differently: Take 25 mg by mouth daily. Take half tablet daily Take with or immediately following a meal.), Disp: 90 tablet, Rfl: 3   Multiple Vitamin (MULTIVITAMIN WITH MINERALS) TABS tablet, Take 1 tablet by mouth daily. Centrum silver, Disp: , Rfl:    SYMBICORT 160-4.5 MCG/ACT inhaler, Inhale 2 puffs into the lungs daily as needed (lungs)., Disp: , Rfl:    Turmeric (QC TUMERIC COMPLEX PO), Take 1,500 mg by mouth daily., Disp: , Rfl:    valACYclovir (VALTREX) 500 MG tablet, Take 500 mg by mouth daily., Disp: , Rfl:    Zinc  Sulfate (ZINC 15 PO), Take 50 mg by mouth daily., Disp: , Rfl:    nicotine (NICODERM CQ - DOSED IN MG/24 HOURS) 14 mg/24hr patch, Place 1 patch (14 mg total) onto the skin daily., Disp: 28 patch, Rfl: 3   Cardiovascular & other pertient studies:  Reviewed external labs and tests, independently interpreted  EKG 04/11/2022: Sinus rhythm 72 bpm Normal EKG  Coronary angiography 04/11/2022: LM: Normal LAD: Tortious vessel          Ostial 20% stenosis with mild calcification Lcx: Tortuous vessel. No significant disease RCA: Large, dominant, tortuous vessel          80% proximal stenosis with adjacent aneurysmal areas           60% distal stenosis   Normal LVEDP   Single vessel obstructive disease Symptoms of decreased  exercise tolerance could be due to combination of CAD as well as PAF Recommend aggressive medical therapy for both Added Imdur 30 mg and Multaq 400 mg bid  If no symptom improvement, recommend PCI to prox-mid RCA, followed by triple therapy for 1 month (ASA, plavix, eliquis), then Aspirin/plavix + eliquis  Exercise Myoview stress test 02/15/2022: Exercise nuclear stress test was performed using Bruce protocol.   1 Day Rest and Stress images. Exercise time 8 minutes 30 seconds, achieved 9.09 METS, 95% APMHR.  No chest pain during exercise. Hypertensive response to exercise (resting BP 122/78, peak BP 230/78). Stress ECG equivocal due to EKG changes (2mm upsloping ST depression inferolateral leads which recovers 3 minutes into recovery, PAC/PVC).  Medium size, mild intensity, reversible perfusion defect involving basal to distal inferior segments suggestive of possible ischemia in the RCA distribution. Left ventricular size normal, LVEF 58%, no regional wall motion abnormalities. Intermediate risk study, clinical correlation required.    Recent labs: 01/09/2023: Glucose 89, BUN/Cr 18/1.1. EGFR 71. K 4.0. Hb 13.3 HbA1C 5.5% Chol 122, TG 145, HDL 44, LDL 53 TSH 1.2 normal   Review of Systems  Constitutional: Negative for malaise/fatigue.  Cardiovascular:  Negative for chest pain, dyspnea on exertion, leg swelling, palpitations and syncope.  Neurological:  Positive for light-headedness.         Vitals:   05/08/23 1424 05/08/23 1425  BP:    Pulse:    SpO2: 94% 94%    Orthostatic VS for the past 72 hrs (Last 3 readings):  Orthostatic BP Patient Position BP Location Cuff Size Orthostatic Pulse  05/08/23 1425 112/66 Standing Left Arm Normal 90  05/08/23 1424 106/63 Sitting Left Arm Normal 88  05/08/23 1423 110/61 Supine Left Arm Normal 85   Left arm: 112/66 mmHg Rt arm: 101/67 mmHg  Body mass index is 26.74 kg/m. Filed Weights   05/08/23 1417  Weight: 189 lb (85.7 kg)       Objective:   Physical Exam Vitals and nursing note reviewed.  Constitutional:      General: He is not in acute distress. Neck:     Vascular: No JVD.  Cardiovascular:     Rate and Rhythm: Normal rate and regular rhythm.     Heart sounds: Normal heart sounds. No murmur heard. Pulmonary:     Effort: Pulmonary effort is normal.     Breath sounds: Normal breath sounds. No wheezing or rales.  Musculoskeletal:     Right lower leg: No edema.     Left lower leg: No edema.             Visit diagnoses: No diagnosis found.  No orders of the defined types were placed in this encounter.    Assessment & Recommendations:   72 y/o Caucasian male with hypertension, hyperlipidemia, CAD, PAD,  paroxysmal A-fib, OSA on CPAP, CIDP  Coronary artery disease, Single-vessel disease with severe mid RCA stenosis with adjacent ectasia. Good exercise capacity without chest pain, but areas of medium size mild ischemia in distal RCA territory noted on stress testing 01/2022. Currently, he has no specific chest pain symptoms.  With rhythm control of A-fib with Multaq, he has not had any significant decreased exercise tolerance symptoms either.  Continue current anti anginal therapy and AAD, with the following exceptions. Continue aspirin 81 mg daily, Imdur 30 mg daily, ultaq 400 mg twice daily,  Lightheadedness: No orthostatic hypotension, no unequal blood pressures today. Blood pressure is lower than usual. I suspect he is slightly dehydrated during the summer with all the medications he is on. Discontinue chlorthalidone 25 mg and metoprolol succinate 25 mg. Recommend liberal hydration.  PAF: No recurrence of A-fib episode, as monitored by patient's Apple Watch, since being on Multaq 400 mg daily. CHA2DS2VAS score 3, annual stroke risk 3.2% Continue Eliquis 5 mg twice daily  Nicotine dependence: Recommend smoking cessation.  F/u in 4 weeks    Elder Negus,  MD Pager: [phone] Office: [phone]

## 2023-05-09 ENCOUNTER — Ambulatory Visit: Payer: Medicare Other | Admitting: Cardiology

## 2023-05-18 ENCOUNTER — Ambulatory Visit: Payer: Medicare Other | Admitting: Cardiology

## 2023-06-07 ENCOUNTER — Ambulatory Visit: Payer: Medicare Other | Admitting: Cardiology

## 2023-06-14 ENCOUNTER — Encounter: Payer: Self-pay | Admitting: Cardiology

## 2023-06-14 ENCOUNTER — Ambulatory Visit: Payer: Medicare Other | Admitting: Cardiology

## 2023-06-14 VITALS — BP 136/76 | HR 76 | Resp 17 | Ht 70.0 in | Wt 193.0 lb

## 2023-06-14 DIAGNOSIS — I2510 Atherosclerotic heart disease of native coronary artery without angina pectoris: Secondary | ICD-10-CM | POA: Diagnosis not present

## 2023-06-14 DIAGNOSIS — I480 Paroxysmal atrial fibrillation: Secondary | ICD-10-CM

## 2023-06-14 DIAGNOSIS — R42 Dizziness and giddiness: Secondary | ICD-10-CM | POA: Diagnosis not present

## 2023-06-14 MED ORDER — NICOTINE 14 MG/24HR TD PT24: 14.0000 mg | MEDICATED_PATCH | Freq: Every day | TRANSDERMAL | 2 refills | Status: DC

## 2023-06-14 MED ORDER — NICOTINE POLACRILEX 2 MG MT GUM: 2.0000 mg | CHEWING_GUM | OROMUCOSAL | 0 refills | Status: AC | PRN

## 2023-06-14 MED ORDER — CHLORTHALIDONE 25 MG PO TABS: 12.5000 mg | ORAL_TABLET | Freq: Every day | ORAL | 3 refills | Status: DC

## 2023-06-14 NOTE — Progress Notes (Signed)
Follow up visit  Subjective:   Richard Lynch, male    DOB: 1951/07/12, 72 y.o.   MRN: [id number]    HPI  Chief Complaint  Patient presents with   Coronary Artery Disease   Follow-up    72 y/o Caucasian male with hypertension, hyperlipidemia, CAD, PAD,  paroxysmal A-fib, OSA on CPAP, CIDP  Lightheadedness symptoms have improved after stopping chlorthalidone 25 mg daily, metoprolol succinate 25 mg daily.  However, he has noticed that his blood pressure has been trending upwards.  He has also noticed mild swelling in his legs, as well as his hands.    Current Outpatient Medications:    allopurinol (ZYLOPRIM) 100 MG tablet, Take 200 mg by mouth daily., Disp: , Rfl:    amphetamine-dextroamphetamine (ADDERALL) 20 MG tablet, Take 20-30 mg by mouth daily as needed (sleepiness)., Disp: , Rfl:    Ascorbic Acid (VITAMIN C PO), Take 600 mg by mouth daily., Disp: , Rfl:    aspirin EC 81 MG tablet, Take 1 tablet (81 mg total) by mouth daily., Disp: 90 tablet, Rfl: 3   atorvastatin (LIPITOR) 40 MG tablet, Take 40 mg by mouth daily., Disp: , Rfl:    B Complex-C (SUPER B COMPLEX PO), Take 1 tablet by mouth daily., Disp: , Rfl:    candesartan (ATACAND) 32 MG tablet, Take 32 mg by mouth daily. , Disp: , Rfl:    Cholecalciferol (VITAMIN D3) 50 MCG (2000 UT) capsule, Take 2,000 Units by mouth daily., Disp: , Rfl:    Coenzyme Q10 (COQ-10 PO), Take 100-300 mg by mouth daily., Disp: , Rfl:    Cyanocobalamin (VITAMIN B-12) 5000 MCG SUBL, Place 10,000 mcg under the tongue daily., Disp: , Rfl:    diltiazem (CARDIZEM) 30 MG tablet, Take 1 tablet every 4 hours AS NEEDED for AFIB heart rate >100, Disp: 45 tablet, Rfl: 1   dronedarone (MULTAQ) 400 MG tablet, Take 1 tablet (400 mg total) by mouth 2 (two) times daily with a meal., Disp: 180 tablet, Rfl: 3   ELIQUIS 5 MG TABS tablet, TAKE 1 TABLET (5 MG TOTAL) BY MOUTH 2 (TWO) TIMES DAILY. RESUME 04/12/2022 EVENING DOSE, Disp: 60 tablet, Rfl: 0   gabapentin  (NEURONTIN) 300 MG capsule, Take 4 capsules (1,200 mg total) by mouth See admin instructions., Disp: 360 capsule, Rfl: 3   isosorbide mononitrate (IMDUR) 30 MG 24 hr tablet, Take 1 tablet (30 mg total) by mouth daily., Disp: 90 tablet, Rfl: 3   Multiple Vitamin (MULTIVITAMIN WITH MINERALS) TABS tablet, Take 1 tablet by mouth daily. Centrum silver, Disp: , Rfl:    nicotine (NICODERM CQ - DOSED IN MG/24 HOURS) 14 mg/24hr patch, Place 1 patch (14 mg total) onto the skin daily., Disp: 28 patch, Rfl: 3   SYMBICORT 160-4.5 MCG/ACT inhaler, Inhale 2 puffs into the lungs daily as needed (lungs)., Disp: , Rfl:    Turmeric (QC TUMERIC COMPLEX PO), Take 1,500 mg by mouth daily., Disp: , Rfl:    valACYclovir (VALTREX) 500 MG tablet, Take 500 mg by mouth daily., Disp: , Rfl:    Zinc Sulfate (ZINC 15 PO), Take 50 mg by mouth daily., Disp: , Rfl:    Cardiovascular & other pertient studies:  Reviewed external labs and tests, independently interpreted  EKG 04/11/2022: Sinus rhythm 72 bpm Normal EKG  Coronary angiography 04/11/2022: LM: Normal LAD: Tortious vessel          Ostial 20% stenosis with mild calcification Lcx: Tortuous vessel. No significant disease RCA: Large, dominant,  tortuous vessel          80% proximal stenosis with adjacent aneurysmal areas           60% distal stenosis   Normal LVEDP   Single vessel obstructive disease Symptoms of decreased exercise tolerance could be due to combination of CAD as well as PAF Recommend aggressive medical therapy for both Added Imdur 30 mg and Multaq 400 mg bid  If no symptom improvement, recommend PCI to prox-mid RCA, followed by triple therapy for 1 month (ASA, plavix, eliquis), then Aspirin/plavix + eliquis  Exercise Myoview stress test 02/15/2022: Exercise nuclear stress test was performed using Bruce protocol.   1 Day Rest and Stress images. Exercise time 8 minutes 30 seconds, achieved 9.09 METS, 95% APMHR.  No chest pain during  exercise. Hypertensive response to exercise (resting BP 122/78, peak BP 230/78). Stress ECG equivocal due to EKG changes (2mm upsloping ST depression inferolateral leads which recovers 3 minutes into recovery, PAC/PVC).  Medium size, mild intensity, reversible perfusion defect involving basal to distal inferior segments suggestive of possible ischemia in the RCA distribution. Left ventricular size normal, LVEF 58%, no regional wall motion abnormalities. Intermediate risk study, clinical correlation required.    Recent labs: 01/09/2023: Glucose 89, BUN/Cr 18/1.1. EGFR 71. K 4.0. Hb 13.3 HbA1C 5.5% Chol 122, TG 145, HDL 44, LDL 53 TSH 1.2 normal   Review of Systems  Constitutional: Negative for malaise/fatigue.  Cardiovascular:  Positive for leg swelling. Negative for chest pain, dyspnea on exertion, palpitations and syncope.  Neurological:  Positive for light-headedness.         There were no vitals filed for this visit.   No data found.  Orthostatic VS for the past 72 hrs (Last 3 readings):  Orthostatic BP Patient Position BP Location Cuff Size Orthostatic Pulse  06/14/23 1112 138/77 Standing Left Arm Large 78  06/14/23 1111 146/85 Sitting Left Arm Large 86  06/14/23 1110 130/71 Supine Left Arm Large 70     There is no height or weight on file to calculate BMI. There were no vitals filed for this visit.     Objective:   Physical Exam Vitals and nursing note reviewed.  Constitutional:      General: He is not in acute distress. Neck:     Vascular: No JVD.  Cardiovascular:     Rate and Rhythm: Normal rate and regular rhythm.     Heart sounds: Normal heart sounds. No murmur heard. Pulmonary:     Effort: Pulmonary effort is normal.     Breath sounds: Normal breath sounds. No wheezing or rales.  Musculoskeletal:     Right lower leg: Edema (Trace) present.     Left lower leg: Edema (Trace) present.             Visit diagnoses:   ICD-10-CM   1. Coronary  artery disease involving native coronary artery of native heart without angina pectoris  I25.10     2. Paroxysmal atrial fibrillation (HCC)  I48.0     3. Lightheadedness  R42           Meds ordered this encounter  Medications   nicotine polacrilex (NICORETTE) 2 MG gum    Sig: Take 1 each (2 mg total) by mouth as needed for smoking cessation.    Dispense:  100 tablet    Refill:  0   nicotine (NICODERM CQ - DOSED IN MG/24 HOURS) 14 mg/24hr patch    Sig: Place 1 patch (14 mg total)  onto the skin daily.    Dispense:  28 patch    Refill:  2   chlorthalidone (HYGROTON) 25 MG tablet    Sig: Take 0.5 tablets (12.5 mg total) by mouth daily.    Dispense:  30 tablet    Refill:  3     Assessment & Recommendations:   72 y/o Caucasian male with hypertension, hyperlipidemia, CAD, PAD,  paroxysmal A-fib, OSA on CPAP, CIDP  Coronary artery disease, Single-vessel disease with severe mid RCA stenosis with adjacent ectasia. Good exercise capacity without chest pain, but areas of medium size mild ischemia in distal RCA territory noted on stress testing 01/2022. Currently, he has no specific chest pain symptoms.  With rhythm control of A-fib with Multaq, he has not had any significant decreased exercise tolerance symptoms either.  Continue current anti anginal therapy and AAD, with the following exceptions. Continue aspirin 81 mg daily, Imdur 30 mg daily, Multaq 400 mg twice daily,  Lightheadedness: Resolved after stopping chlorthalidone 25 mg daily, and metoprolol succinate 25 mg daily. See below regarding hypertension management.    Hypertension: Blood pressure trending up after stopping chlorthalidone 25 mg daily, and metoprolol succinate 25 mg daily. Resume chlorthalidone at 12.5 mg daily. Blood pressure closely, has follow-up with PCP next few weeks. I am not sure if his hand swelling is all due to fluid retention, or is it due to arthritis.  Recommend follow-up with PCP.  PAF: No  recurrence of A-fib episode, as monitored by patient's Apple Watch, since being on Multaq 400 mg daily. CHA2DS2VAS score 3, annual stroke risk 3.2% Continue Eliquis 5 mg twice daily  Nicotine dependence: We need to use nicotine patch and gum at this time.  F/u in 3 months    Elder Negus, MD Pager: [phone] Office: [phone]

## 2023-06-16 ENCOUNTER — Other Ambulatory Visit: Payer: Self-pay | Admitting: Cardiology

## 2023-06-16 DIAGNOSIS — I480 Paroxysmal atrial fibrillation: Secondary | ICD-10-CM

## 2023-07-12 DIAGNOSIS — Z6827 Body mass index (BMI) 27.0-27.9, adult: Secondary | ICD-10-CM | POA: Diagnosis not present

## 2023-07-12 DIAGNOSIS — E782 Mixed hyperlipidemia: Secondary | ICD-10-CM | POA: Diagnosis not present

## 2023-07-12 DIAGNOSIS — G4719 Other hypersomnia: Secondary | ICD-10-CM | POA: Diagnosis not present

## 2023-07-12 DIAGNOSIS — G4733 Obstructive sleep apnea (adult) (pediatric): Secondary | ICD-10-CM | POA: Diagnosis not present

## 2023-07-12 DIAGNOSIS — R7303 Prediabetes: Secondary | ICD-10-CM | POA: Diagnosis not present

## 2023-07-12 DIAGNOSIS — M79642 Pain in left hand: Secondary | ICD-10-CM | POA: Diagnosis not present

## 2023-07-12 DIAGNOSIS — E7800 Pure hypercholesterolemia, unspecified: Secondary | ICD-10-CM | POA: Diagnosis not present

## 2023-07-12 DIAGNOSIS — M79641 Pain in right hand: Secondary | ICD-10-CM | POA: Diagnosis not present

## 2023-07-14 ENCOUNTER — Other Ambulatory Visit: Payer: Self-pay | Admitting: Cardiology

## 2023-07-14 DIAGNOSIS — I480 Paroxysmal atrial fibrillation: Secondary | ICD-10-CM

## 2023-07-16 NOTE — Telephone Encounter (Signed)
Prescription refill request for Eliquis received. Indication:afib Last office visit:8/24 Scr:1.0  9/24 Age: 72 Weight:87.5  kg  Prescription refilled

## 2023-07-19 DIAGNOSIS — M7541 Impingement syndrome of right shoulder: Secondary | ICD-10-CM | POA: Diagnosis not present

## 2023-07-19 DIAGNOSIS — M542 Cervicalgia: Secondary | ICD-10-CM | POA: Diagnosis not present

## 2023-07-28 ENCOUNTER — Other Ambulatory Visit: Payer: Self-pay | Admitting: Neurology

## 2023-08-01 DIAGNOSIS — M7541 Impingement syndrome of right shoulder: Secondary | ICD-10-CM | POA: Diagnosis not present

## 2023-08-16 DIAGNOSIS — M542 Cervicalgia: Secondary | ICD-10-CM | POA: Diagnosis not present

## 2023-08-16 DIAGNOSIS — M25511 Pain in right shoulder: Secondary | ICD-10-CM | POA: Diagnosis not present

## 2023-08-21 DIAGNOSIS — M542 Cervicalgia: Secondary | ICD-10-CM | POA: Diagnosis not present

## 2023-08-21 DIAGNOSIS — M25511 Pain in right shoulder: Secondary | ICD-10-CM | POA: Diagnosis not present

## 2023-08-22 DIAGNOSIS — M25511 Pain in right shoulder: Secondary | ICD-10-CM | POA: Diagnosis not present

## 2023-08-22 DIAGNOSIS — M542 Cervicalgia: Secondary | ICD-10-CM | POA: Diagnosis not present

## 2023-08-28 DIAGNOSIS — M542 Cervicalgia: Secondary | ICD-10-CM | POA: Diagnosis not present

## 2023-08-28 DIAGNOSIS — M25511 Pain in right shoulder: Secondary | ICD-10-CM | POA: Diagnosis not present

## 2023-08-29 ENCOUNTER — Encounter: Payer: Self-pay | Admitting: Neurology

## 2023-08-29 DIAGNOSIS — M2550 Pain in unspecified joint: Secondary | ICD-10-CM

## 2023-08-30 ENCOUNTER — Ambulatory Visit: Payer: Self-pay | Admitting: Cardiology

## 2023-08-31 ENCOUNTER — Emergency Department (HOSPITAL_BASED_OUTPATIENT_CLINIC_OR_DEPARTMENT_OTHER)
Admission: EM | Admit: 2023-08-31 | Discharge: 2023-08-31 | Disposition: A | Discharge status: Home / Self Care | Payer: Medicare Other | Attending: Emergency Medicine | Admitting: Emergency Medicine

## 2023-08-31 ENCOUNTER — Other Ambulatory Visit: Payer: Self-pay

## 2023-08-31 ENCOUNTER — Emergency Department (HOSPITAL_BASED_OUTPATIENT_CLINIC_OR_DEPARTMENT_OTHER): Payer: Medicare Other | Admitting: Radiology

## 2023-08-31 ENCOUNTER — Encounter (HOSPITAL_BASED_OUTPATIENT_CLINIC_OR_DEPARTMENT_OTHER): Payer: Self-pay | Admitting: Emergency Medicine

## 2023-08-31 ENCOUNTER — Other Ambulatory Visit (HOSPITAL_BASED_OUTPATIENT_CLINIC_OR_DEPARTMENT_OTHER): Payer: Self-pay

## 2023-08-31 DIAGNOSIS — G4733 Obstructive sleep apnea (adult) (pediatric): Secondary | ICD-10-CM | POA: Diagnosis not present

## 2023-08-31 DIAGNOSIS — M25462 Effusion, left knee: Secondary | ICD-10-CM | POA: Diagnosis not present

## 2023-08-31 DIAGNOSIS — I10 Essential (primary) hypertension: Secondary | ICD-10-CM | POA: Insufficient documentation

## 2023-08-31 DIAGNOSIS — M109 Gout, unspecified: Secondary | ICD-10-CM | POA: Diagnosis not present

## 2023-08-31 DIAGNOSIS — M25562 Pain in left knee: Secondary | ICD-10-CM | POA: Diagnosis not present

## 2023-08-31 DIAGNOSIS — M1712 Unilateral primary osteoarthritis, left knee: Secondary | ICD-10-CM | POA: Insufficient documentation

## 2023-08-31 DIAGNOSIS — Z7901 Long term (current) use of anticoagulants: Secondary | ICD-10-CM | POA: Insufficient documentation

## 2023-08-31 LAB — CBC WITH DIFFERENTIAL/PLATELET
Abs Immature Granulocytes: 0.07 10*3/uL (ref 0.00–0.07)
Basophils Absolute: 0.1 10*3/uL (ref 0.0–0.1)
Basophils Relative: 0 %
Eosinophils Absolute: 0 10*3/uL (ref 0.0–0.5)
Eosinophils Relative: 0 %
HCT: 34 % — ABNORMAL LOW (ref 39.0–52.0)
Hemoglobin: 11.4 g/dL — ABNORMAL LOW (ref 13.0–17.0)
Immature Granulocytes: 0 %
Lymphocytes Relative: 7 %
Lymphs Abs: 1.3 10*3/uL (ref 0.7–4.0)
MCH: 31.8 pg (ref 26.0–34.0)
MCHC: 33.5 g/dL (ref 30.0–36.0)
MCV: 95 fL (ref 80.0–100.0)
Monocytes Absolute: 1.3 10*3/uL — ABNORMAL HIGH (ref 0.1–1.0)
Monocytes Relative: 8 %
Neutro Abs: 15 10*3/uL — ABNORMAL HIGH (ref 1.7–7.7)
Neutrophils Relative %: 85 %
Platelets: 307 10*3/uL (ref 150–400)
RBC: 3.58 MIL/uL — ABNORMAL LOW (ref 4.22–5.81)
RDW: 15.4 % (ref 11.5–15.5)
WBC: 17.7 10*3/uL — ABNORMAL HIGH (ref 4.0–10.5)
nRBC: 0 % (ref 0.0–0.2)

## 2023-08-31 LAB — BASIC METABOLIC PANEL
Anion gap: 9 (ref 5–15)
BUN: 19 mg/dL (ref 8–23)
CO2: 29 mmol/L (ref 22–32)
Calcium: 9.4 mg/dL (ref 8.9–10.3)
Chloride: 97 mmol/L — ABNORMAL LOW (ref 98–111)
Creatinine, Ser: 1.32 mg/dL — ABNORMAL HIGH (ref 0.61–1.24)
GFR, Estimated: 58 mL/min — ABNORMAL LOW (ref >60)
Glucose, Bld: 133 mg/dL — ABNORMAL HIGH (ref 70–99)
Potassium: 3.6 mmol/L (ref 3.5–5.1)
Sodium: 135 mmol/L (ref 135–145)

## 2023-08-31 LAB — CBG MONITORING, ED: Glucose-Capillary: 110 mg/dL — ABNORMAL HIGH (ref 70–99)

## 2023-08-31 LAB — SYNOVIAL CELL COUNT + DIFF, W/ CRYSTALS
Eosinophils-Synovial: 0 % (ref 0–1)
Lymphocytes-Synovial Fld: 0 % (ref 0–20)
Monocyte-Macrophage-Synovial Fluid: 17 % — ABNORMAL LOW (ref 50–90)
Neutrophil, Synovial: 83 % — ABNORMAL HIGH (ref 0–25)
WBC, Synovial: 32625 /mm3 — ABNORMAL HIGH (ref 0–200)

## 2023-08-31 MED ORDER — SODIUM CHLORIDE 0.9 % IV BOLUS
500.0000 mL | Freq: Once | INTRAVENOUS | Status: AC
Start: 2023-08-31 — End: 2023-08-31
  Administered 2023-08-31: 500 mL via INTRAVENOUS

## 2023-08-31 MED ORDER — SENNOSIDES-DOCUSATE SODIUM 8.6-50 MG PO TABS: 1.0000 | ORAL_TABLET | Freq: Every evening | ORAL | 0 refills | Status: DC | PRN

## 2023-08-31 MED ORDER — FENTANYL CITRATE PF 50 MCG/ML IJ SOSY
50.0000 ug | PREFILLED_SYRINGE | Freq: Once | INTRAMUSCULAR | Status: AC
  Administered 2023-08-31: 50 ug via INTRAVENOUS
  Filled 2023-08-31: qty 1

## 2023-08-31 MED ORDER — SENNOSIDES-DOCUSATE SODIUM 8.6-50 MG PO TABS
1.0000 | ORAL_TABLET | Freq: Every evening | ORAL | 0 refills | Status: DC | PRN
Start: 2023-08-31 — End: 2023-08-31
  Filled 2023-08-31: qty 100, 100d supply, fill #0

## 2023-08-31 MED ORDER — PREDNISONE 10 MG (21) PO TBPK
ORAL_TABLET | Freq: Every day | ORAL | 0 refills | Status: DC
  Filled 2023-08-31: qty 42, fill #0

## 2023-08-31 MED ORDER — PREDNISONE 10 MG (21) PO TBPK: ORAL_TABLET | Freq: Every day | ORAL | 0 refills | Status: DC

## 2023-08-31 MED ORDER — OXYCODONE-ACETAMINOPHEN 5-325 MG PO TABS
1.0000 | ORAL_TABLET | Freq: Once | ORAL | Status: AC
  Administered 2023-08-31: 1 via ORAL
  Filled 2023-08-31: qty 1

## 2023-08-31 MED ORDER — OXYCODONE-ACETAMINOPHEN 5-325 MG PO TABS: 1.0000 | ORAL_TABLET | Freq: Four times a day (QID) | ORAL | 0 refills | Status: DC | PRN

## 2023-08-31 MED ORDER — OXYCODONE-ACETAMINOPHEN 5-325 MG PO TABS
1.0000 | ORAL_TABLET | Freq: Four times a day (QID) | ORAL | 0 refills | Status: DC | PRN
  Filled 2023-08-31: qty 15, 4d supply, fill #0

## 2023-08-31 MED ORDER — LIDOCAINE HCL (PF) 1 % IJ SOLN
5.0000 mL | Freq: Once | INTRAMUSCULAR | Status: AC
  Administered 2023-08-31: 5 mL
  Filled 2023-08-31: qty 5

## 2023-08-31 NOTE — Discharge Instructions (Signed)
Please call your orthopedic team this afternoon or first thing Monday to schedule a follow up.

## 2023-08-31 NOTE — ED Provider Notes (Signed)
Emergency Department Provider Note   I have reviewed the triage vital signs and the nursing notes.   HISTORY  Chief Complaint Knee Pain   HPI Richard Lynch is a 72 y.o. male presents to the emergency with recurrent left knee pain and swelling.  He follows with Delbert Harness for his various joint issues.  He recently had a steroid injection in the right shoulder and has finished a recent course of steroids.  He has had progressive pain in the left knee with swelling.  He denies injury.  This problem is recurrent.  He denies any fevers or chills.  No redness.  No pain into the thigh or calf.  His pain is moderate to severe to the point where he is significant discomfort trying to bear weight.    Past Medical History:  Diagnosis Date   Atrial fibrillation (HCC)    Constipation due to opioid therapy 12/22/2015   Emphysema lung (HCC)    Gait abnormality 02/20/2017   GERD (gastroesophageal reflux disease)    High cholesterol    Hypertension    OSA (obstructive sleep apnea)    on CPAP   Primary localized osteoarthritis of right knee 12/08/2015   Septic olecranon bursitis 06/2015   MSSA    Review of Systems  Constitutional: No fever/chills Cardiovascular: Denies chest pain. Respiratory: Denies shortness of breath. Gastrointestinal: No abdominal pain.  No nausea, no vomiting.  Musculoskeletal: Positive left knee pain.  Skin: Negative for rash. Neurological: Negative for headaches.   ____________________________________________   PHYSICAL EXAM:  VITAL SIGNS: ED Triage Vitals  Encounter Vitals Group     BP 08/31/23 0759 113/68     Pulse Rate 08/31/23 0759 100     Resp 08/31/23 0759 20     Temp 08/31/23 0759 98.3 F (36.8 C)     Temp Source 08/31/23 0759 Oral     SpO2 08/31/23 0759 94 %     Weight 08/31/23 0803 190 lb (86.2 kg)     Height 08/31/23 0803 5\' 10"  (1.778 m)   Constitutional: Alert and oriented. Well appearing and in no acute distress. Eyes: Conjunctivae  are normal.  Head: Atraumatic. Nose: No congestion/rhinnorhea. Mouth/Throat: Mucous membranes are moist.   Neck: No stridor.   Cardiovascular: Normal rate, regular rhythm. Good peripheral circulation. Grossly normal heart sounds.   Respiratory: Normal respiratory effort.   Gastrointestinal: No distention.  Musculoskeletal: Left knee effusion without erythema or warmth to touch.  Range of motion limited secondary to pain.  Normal pulses and sensation.  Compartments are soft. Neurologic:  Normal speech and language.  Skin:  Skin is warm, dry and intact. No rash noted.  ____________________________________________   LABS (all labs ordered are listed, but only abnormal results are displayed)  Labs Reviewed  BASIC METABOLIC PANEL - Abnormal; Notable for the following components:      Result Value   Chloride 97 (*)    Glucose, Bld 133 (*)    Creatinine, Ser 1.32 (*)    GFR, Estimated 58 (*)    All other components within normal limits  CBC WITH DIFFERENTIAL/PLATELET - Abnormal; Notable for the following components:   WBC 17.7 (*)    RBC 3.58 (*)    Hemoglobin 11.4 (*)    HCT 34.0 (*)    Neutro Abs 15.0 (*)    Monocytes Absolute 1.3 (*)    All other components within normal limits  SYNOVIAL CELL COUNT + DIFF, W/ CRYSTALS - Abnormal; Notable for the following components:  Appearance-Synovial TURBID (*)    WBC, Synovial 32,625 (*)    Neutrophil, Synovial 83 (*)    Monocyte-Macrophage-Synovial Fluid 17 (*)    All other components within normal limits  CBG MONITORING, ED - Abnormal; Notable for the following components:   Glucose-Capillary 110 (*)    All other components within normal limits  BODY FLUID CULTURE W GRAM STAIN  GLUCOSE, BODY FLUID OTHER             ____________________________________________  RADIOLOGY  DG Knee Complete 4 Views Left  Result Date: 08/31/2023 CLINICAL DATA:  Two day history of left knee pain.  No known injury. EXAM: LEFT KNEE - COMPLETE 4 VIEW  COMPARISON:  Left knee radiograph dated 06/08/2021 FINDINGS: No acute fracture or dislocation. Similar large joint effusion. Severe tricompartmental degenerative changes of the knee. Soft tissues are unremarkable. IMPRESSION: 1. No acute fracture or dislocation. 2. Similar large joint effusion. 3. Severe tricompartmental degenerative changes of the knee. Electronically Signed   By: Agustin Cree M.D.   On: 08/31/2023 09:03    ____________________________________________   PROCEDURES  Procedure(s) performed:   .Joint Aspiration/Arthrocentesis  Date/Time: 08/31/2023 10:14 AM  Performed by: Maia Plan, MD Authorized by: Maia Plan, MD   Consent:    Consent obtained:  Verbal   Consent given by:  Patient   Risks, benefits, and alternatives were discussed: yes     Risks discussed:  Bleeding, incomplete drainage, infection, nerve damage and pain   Alternatives discussed:  No treatment Universal protocol:    Patient identity confirmed:  Verbally with patient Location:    Location:  Knee   Knee:  L knee Anesthesia:    Anesthesia method:  Local infiltration   Local anesthetic:  Lidocaine 1% w/o epi Procedure details:    Preparation: Patient was prepped and draped in usual sterile fashion     Needle gauge:  18 G   Ultrasound guidance: no     Approach:  Lateral   Aspirate amount:  55 ml   Aspirate characteristics:  Yellow and serous   Steroid injected: no     Specimen collected: yes   Post-procedure details:    Dressing:  Adhesive bandage   Procedure completion:  Tolerated well, no immediate complications    ____________________________________________   INITIAL IMPRESSION / ASSESSMENT AND PLAN / ED COURSE  Pertinent labs & imaging results that were available during my care of the patient were reviewed by me and considered in my medical decision making (see chart for details).   This patient is Presenting for Evaluation of knee pain, which does require a range of treatment  options, and is a complaint that involves a high risk of morbidity and mortality.  The Differential Diagnoses include gout, OA, ligamentous injury, septic joint, etc.  Critical Interventions-    Medications  fentaNYL (SUBLIMAZE) injection 50 mcg (50 mcg Intravenous Given 08/31/23 0858)  sodium chloride 0.9 % bolus 500 mL (0 mLs Intravenous Stopped 08/31/23 1009)  lidocaine (PF) (XYLOCAINE) 1 % injection 5 mL (5 mLs Infiltration Given 08/31/23 1003)  fentaNYL (SUBLIMAZE) injection 50 mcg (50 mcg Intravenous Given 08/31/23 1259)  oxyCODONE-acetaminophen (PERCOCET/ROXICET) 5-325 MG per tablet 1 tablet (1 tablet Oral Given 08/31/23 1901)    Reassessment after intervention:  pain improved.    I did obtain Additional Historical Information from family at bedside.   I decided to review pertinent External Data, and in summary patient seen in the ED in 2022 with similar pain.   Clinical Laboratory  Tests Ordered, included CBC with leukocytosis. Fluid analysis pending.   Radiologic Tests Ordered, included knee XR. I independently interpreted the images and agree with radiology interpretation.   Cardiac Monitor Tracing which shows NSR.    Social Determinants of Health Risk patient is a smoker.   Medical Decision Making: Summary:  Patient presents emergency department with left knee pain.  Has a moderate to large effusion without septic joint.  He is on Eliquis.  Discussed the risk/benefit of arthrocentesis in the ED versus orthopedist. Plan for labs, pain medication, and XR imaging.   Reevaluation with update and discussion with patient. Pain improved. Synovial fluid analysis pending. Care transferred to Dr. Maple Hudson.   Considered admission but remaining ED work up is pending.   Patient's presentation is most consistent with acute presentation with potential threat to life or bodily function.   Disposition: pending   ____________________________________________  FINAL CLINICAL IMPRESSION(S) /  ED DIAGNOSES  Final diagnoses:  Acute pain of left knee     NEW OUTPATIENT MEDICATIONS STARTED DURING THIS VISIT:  Discharge Medication List as of 08/31/2023  6:52 PM     START taking these medications   Details  oxyCODONE-acetaminophen (PERCOCET/ROXICET) 5-325 MG tablet Take 1 tablet by mouth every 6 (six) hours as needed for severe pain (pain score 7-10)., Starting Fri 08/31/2023, Normal    predniSONE (STERAPRED UNI-PAK 21 TAB) 10 MG (21) TBPK tablet Take by mouth daily. Take 6 tabs by mouth daily  for 2 days, then 5 tabs for 2 days, then 4 tabs for 2 days, then 3 tabs for 2 days, 2 tabs for 2 days, then 1 tab by mouth daily for 2 days, Starting Fri 08/31/2023, Normal    senna-docusate (SENOKOT-S) 8.6-50 MG tablet Take 1 tablet by mouth at bedtime as needed for mild constipation., Starting Fri 08/31/2023, Normal        Note:  This document was prepared using Dragon voice recognition software and may include unintentional dictation errors.  Alona Bene, MD, [facility]    Kaytlen Lightsey, Arlyss Repress, MD 09/01/23 [phone]

## 2023-08-31 NOTE — ED Notes (Signed)
Rt Note: Noticed patient oxygen saturation was dropping to 83% room air. Upon assessing the patient , he stated he does have sleep apnea. He was napping when is saturation was dropping. He was placed on 2lpm [place] so he could rest. He said he has had OSA and wears a CPAP of 58yrs

## 2023-08-31 NOTE — ED Notes (Signed)
Spoke with lab at [facility]. Reports about 30 minutes left on synovial fluid lab.

## 2023-08-31 NOTE — ED Triage Notes (Signed)
Pt caox4 c/o L knee pain stating it started Wed morning and has gotten too painful to touch or bear weight on that leg. PMH arthritis and gout. Pt denies any recent falls, trauma, or injury.

## 2023-08-31 NOTE — ED Provider Notes (Signed)
Received handoff from Dr. Jacqulyn Bath, pending knee arthrocentesis labs.  Reevaluated, feeling somewhat improved after arthrocentesis pain medications.  Physical Exam  BP 114/70   Pulse 89   Temp 98.4 F (36.9 C) (Oral)   Resp 18   Ht 5\' 10"  (1.778 m)   Wt 86.2 kg   SpO2 97%   BMI 27.26 kg/m   Physical Exam Vitals reviewed.  HENT:     Head: Normocephalic.     Mouth/Throat:     Mouth: Mucous membranes are moist.  Eyes:     Conjunctiva/sclera: Conjunctivae normal.  Cardiovascular:     Rate and Rhythm: Normal rate.     Pulses: Normal pulses.  Pulmonary:     Effort: Pulmonary effort is normal.  Musculoskeletal:     Comments: Left knee with swelling.  No erythema.  Minimal warmth  Neurological:     General: No focal deficit present.     Mental Status: He is alert.  Psychiatric:        Mood and Affect: Mood normal.        Behavior: Behavior normal.     Procedures  Procedures  ED Course / MDM    Medical Decision Making Received signout from Dr. Jacqulyn Bath, see his note for full HPI.  Arthrocentesis consistent with gout/pseudogout.  Has follow-up with Ortho next week.  NSAIDs contraindicated for patient.  Discharged with pain meds and steroids.  Given crutches for help ambulating.  Stable for discharge at this time.  Amount and/or Complexity of Data Reviewed Labs: ordered. Radiology: ordered.  Risk OTC drugs. Prescription drug management.         Coral Spikes, DO 08/31/23 [phone]

## 2023-09-01 ENCOUNTER — Other Ambulatory Visit (HOSPITAL_BASED_OUTPATIENT_CLINIC_OR_DEPARTMENT_OTHER): Payer: Self-pay

## 2023-09-03 ENCOUNTER — Other Ambulatory Visit (HOSPITAL_BASED_OUTPATIENT_CLINIC_OR_DEPARTMENT_OTHER): Payer: Self-pay

## 2023-09-04 DIAGNOSIS — M542 Cervicalgia: Secondary | ICD-10-CM | POA: Diagnosis not present

## 2023-09-04 DIAGNOSIS — M25511 Pain in right shoulder: Secondary | ICD-10-CM | POA: Diagnosis not present

## 2023-09-04 LAB — BODY FLUID CULTURE W GRAM STAIN: Culture: NO GROWTH

## 2023-09-04 LAB — GLUCOSE, BODY FLUID OTHER

## 2023-09-05 DIAGNOSIS — M542 Cervicalgia: Secondary | ICD-10-CM | POA: Diagnosis not present

## 2023-09-05 DIAGNOSIS — M7541 Impingement syndrome of right shoulder: Secondary | ICD-10-CM | POA: Diagnosis not present

## 2023-09-06 DIAGNOSIS — M542 Cervicalgia: Secondary | ICD-10-CM | POA: Diagnosis not present

## 2023-09-06 DIAGNOSIS — M25511 Pain in right shoulder: Secondary | ICD-10-CM | POA: Diagnosis not present

## 2023-09-10 DIAGNOSIS — M25511 Pain in right shoulder: Secondary | ICD-10-CM | POA: Diagnosis not present

## 2023-09-10 DIAGNOSIS — M542 Cervicalgia: Secondary | ICD-10-CM | POA: Diagnosis not present

## 2023-09-13 DIAGNOSIS — Z72820 Sleep deprivation: Secondary | ICD-10-CM | POA: Diagnosis not present

## 2023-09-13 DIAGNOSIS — G4719 Other hypersomnia: Secondary | ICD-10-CM | POA: Diagnosis not present

## 2023-09-13 DIAGNOSIS — M542 Cervicalgia: Secondary | ICD-10-CM | POA: Diagnosis not present

## 2023-09-13 DIAGNOSIS — M25511 Pain in right shoulder: Secondary | ICD-10-CM | POA: Diagnosis not present

## 2023-09-13 DIAGNOSIS — M109 Gout, unspecified: Secondary | ICD-10-CM | POA: Diagnosis not present

## 2023-09-17 DIAGNOSIS — M79641 Pain in right hand: Secondary | ICD-10-CM | POA: Diagnosis not present

## 2023-09-17 DIAGNOSIS — M2560 Stiffness of unspecified joint, not elsewhere classified: Secondary | ICD-10-CM | POA: Diagnosis not present

## 2023-09-17 DIAGNOSIS — E663 Overweight: Secondary | ICD-10-CM | POA: Diagnosis not present

## 2023-09-17 DIAGNOSIS — M79642 Pain in left hand: Secondary | ICD-10-CM | POA: Diagnosis not present

## 2023-09-17 DIAGNOSIS — M2540 Effusion, unspecified joint: Secondary | ICD-10-CM | POA: Diagnosis not present

## 2023-09-17 DIAGNOSIS — Z6827 Body mass index (BMI) 27.0-27.9, adult: Secondary | ICD-10-CM | POA: Diagnosis not present

## 2023-09-17 DIAGNOSIS — M109 Gout, unspecified: Secondary | ICD-10-CM | POA: Diagnosis not present

## 2023-09-17 DIAGNOSIS — M1120 Other chondrocalcinosis, unspecified site: Secondary | ICD-10-CM | POA: Diagnosis not present

## 2023-09-25 ENCOUNTER — Ambulatory Visit: Payer: Medicare Other | Attending: Cardiology | Admitting: Cardiology

## 2023-09-25 ENCOUNTER — Encounter: Payer: Self-pay | Admitting: Cardiology

## 2023-09-25 ENCOUNTER — Other Ambulatory Visit: Payer: Self-pay

## 2023-09-25 VITALS — BP 100/68 | HR 103 | Resp 16 | Ht 70.0 in | Wt 188.0 lb

## 2023-09-25 DIAGNOSIS — I480 Paroxysmal atrial fibrillation: Secondary | ICD-10-CM | POA: Diagnosis not present

## 2023-09-25 DIAGNOSIS — F17210 Nicotine dependence, cigarettes, uncomplicated: Secondary | ICD-10-CM | POA: Diagnosis not present

## 2023-09-25 DIAGNOSIS — M7541 Impingement syndrome of right shoulder: Secondary | ICD-10-CM | POA: Diagnosis not present

## 2023-09-25 DIAGNOSIS — M542 Cervicalgia: Secondary | ICD-10-CM | POA: Diagnosis not present

## 2023-09-25 DIAGNOSIS — I25118 Atherosclerotic heart disease of native coronary artery with other forms of angina pectoris: Secondary | ICD-10-CM

## 2023-09-25 DIAGNOSIS — M25511 Pain in right shoulder: Secondary | ICD-10-CM | POA: Diagnosis not present

## 2023-09-25 MED ORDER — APIXABAN 5 MG PO TABS: 5.0000 mg | ORAL_TABLET | Freq: Two times a day (BID) | ORAL | 5 refills | Status: DC

## 2023-09-25 NOTE — Telephone Encounter (Signed)
Prescription refill request for Eliquis received. Indication:afib Last office visit:12/24 Scr:1.32  11/24 Age: 72 Weight:85.3  kg  Prescription refilled

## 2023-09-25 NOTE — Patient Instructions (Signed)
Medication Instructions:   STOP TAKING CHLORTHALIDONE NOW  *If you need a refill on your cardiac medications before your next appointment, please call your pharmacy*     Follow-Up: At [facility], you and your health needs are our priority.  As part of our continuing mission to provide you with exceptional heart care, we have created designated Provider Care Teams.  These Care Teams include your primary Cardiologist (physician) and Advanced Practice Providers (APPs -  Physician Assistants and Nurse Practitioners) who all work together to provide you with the care you need, when you need it.  We recommend signing up for the patient portal called "MyChart".  Sign up information is provided on this After Visit Summary.  MyChart is used to connect with patients for Virtual Visits (Telemedicine).  Patients are able to view lab/test results, encounter notes, upcoming appointments, etc.  Non-urgent messages can be sent to your provider as well.   To learn more about what you can do with MyChart, go to [email or web address].    Your next appointment:   6 month(s)  Provider:   DR. Rosemary Holms

## 2023-09-25 NOTE — Progress Notes (Signed)
Cardiology Office Note:  .   Date:  09/25/2023  ID:  Richard Lynch, DOB Aug 26, 1951, MRN [id number] PCP: Jackelyn Poling, DO  [facility] Providers Cardiologist:  Truett Mainland, MD PCP: Jackelyn Poling, DO  Chief Complaint  Patient presents with   Coronary artery disease involving native coronary artery of   Follow-up      History of Present Illness: .    Richard Lynch is a 72 y.o. male with hypertension, hyperlipidemia, CAD, PAD, paroxysmal A-fib, OSA on CPAP, CIDP   Patient is doing well from cardiac standpoint, denies any chest pain or shortness of breath symptoms.  Continues to have some degree of lightheadedness.  Blood pressure remains below normal.  Vitals:   09/25/23 1325  BP: 100/68  Pulse: (!) 103  Resp: 16  SpO2: 93%     ROS:  Review of Systems  Cardiovascular:  Negative for chest pain, dyspnea on exertion, leg swelling, palpitations and syncope.     Studies Reviewed: Marland Kitchen        EKG 09/25/2023: Sinus tachycardia When compared with ECG of 11-Apr-2022 05:59, No significant change was found  Independently interpreted Labs 06/2023: Chol 114, TG 106, HDL 44, LDL 50 HbA1C 6.2% TSH 1.2 Cr 1.3     Risk Assessment/Calculations:    CHA2DS2-VASc Score = 3   This indicates a 3.2 % annual risk of stroke. The patient's score is based upon:     Physical Exam:   Physical Exam Vitals and nursing note reviewed.  Constitutional:      General: He is not in acute distress. Neck:     Vascular: No JVD.  Cardiovascular:     Rate and Rhythm: Normal rate and regular rhythm.     Heart sounds: Normal heart sounds. No murmur heard. Pulmonary:     Effort: Pulmonary effort is normal.     Breath sounds: Normal breath sounds. No wheezing or rales.      VISIT DIAGNOSES:   ICD-10-CM   1. Paroxysmal atrial fibrillation (HCC)  I48.0 EKG 12-Lead       ASSESSMENT AND PLAN: .    Richard Lynch is a 72 y.o. male with  hypertension, hyperlipidemia, CAD,  PAD,  paroxysmal A-fib, OSA on CPAP, CIDP   Coronary artery disease, Single-vessel disease with severe mid RCA stenosis with adjacent ectasia. Good exercise capacity without chest pain, but areas of medium size mild ischemia in distal RCA territory noted on stress testing 01/2022. Currently, he has no specific chest pain symptoms.  With rhythm control of A-fib with Multaq, he has not had any significant decreased exercise tolerance symptoms either.  Continue current anti anginal therapy and AAD. Continue aspirin 81 mg daily, Imdur 30 mg daily, metoprolol succinate 50 mg daily, Multaq 400 mg twice daily, Given that his blood pressure remains low normal, associated with some degree of lightheadedness, I will stop his chlorthalidone completely today.  If blood pressure continues to stay low normal, we can reduce candesartan down from 32 mg daily to 16 mg daily, in future.  Patient will send me a MyChart message in 4 weeks to update me about his blood pressure readings.   PAF: No recurrence of A-fib episode, as monitored by patient's Apple Watch, since being on Multaq 400 mg daily. CHA2DS2VAS score 3, annual stroke risk 3.2% Continue Eliquis 5 mg twice daily   Nicotine dependence: Tobacco cessation counseling:  - Currently smoking 1 packs/day   - Patient was informed of the dangers of tobacco abuse  including stroke, cancer, and MI, as well as benefits of tobacco cessation. - Patient is willing to quit at this time. - Approximately 5 mins were spent counseling patient cessation techniques. We discussed various methods to help quit smoking, including deciding on a date to quit, joining a support group, pharmacological agents. Patient would like to use nicotine patch. - I will reassess his progress at the next follow-up visit      F/u in 6 months  Signed, Elder Negus, MD

## 2023-09-28 DIAGNOSIS — M25511 Pain in right shoulder: Secondary | ICD-10-CM | POA: Diagnosis not present

## 2023-09-28 DIAGNOSIS — M542 Cervicalgia: Secondary | ICD-10-CM | POA: Diagnosis not present

## 2023-10-02 DIAGNOSIS — M25511 Pain in right shoulder: Secondary | ICD-10-CM | POA: Diagnosis not present

## 2023-10-02 DIAGNOSIS — M542 Cervicalgia: Secondary | ICD-10-CM | POA: Diagnosis not present

## 2023-10-03 DIAGNOSIS — M25511 Pain in right shoulder: Secondary | ICD-10-CM | POA: Diagnosis not present

## 2023-10-03 DIAGNOSIS — M542 Cervicalgia: Secondary | ICD-10-CM | POA: Diagnosis not present

## 2023-10-04 DIAGNOSIS — M109 Gout, unspecified: Secondary | ICD-10-CM | POA: Diagnosis not present

## 2023-10-04 DIAGNOSIS — M2560 Stiffness of unspecified joint, not elsewhere classified: Secondary | ICD-10-CM | POA: Diagnosis not present

## 2023-10-04 DIAGNOSIS — M79642 Pain in left hand: Secondary | ICD-10-CM | POA: Diagnosis not present

## 2023-10-04 DIAGNOSIS — Z6827 Body mass index (BMI) 27.0-27.9, adult: Secondary | ICD-10-CM | POA: Diagnosis not present

## 2023-10-04 DIAGNOSIS — M79641 Pain in right hand: Secondary | ICD-10-CM | POA: Diagnosis not present

## 2023-10-04 DIAGNOSIS — M1120 Other chondrocalcinosis, unspecified site: Secondary | ICD-10-CM | POA: Diagnosis not present

## 2023-10-04 DIAGNOSIS — M2540 Effusion, unspecified joint: Secondary | ICD-10-CM | POA: Diagnosis not present

## 2023-10-04 DIAGNOSIS — M353 Polymyalgia rheumatica: Secondary | ICD-10-CM | POA: Diagnosis not present

## 2023-10-04 DIAGNOSIS — E663 Overweight: Secondary | ICD-10-CM | POA: Diagnosis not present

## 2023-10-06 ENCOUNTER — Other Ambulatory Visit: Payer: Self-pay | Admitting: Cardiology

## 2023-10-06 DIAGNOSIS — I2510 Atherosclerotic heart disease of native coronary artery without angina pectoris: Secondary | ICD-10-CM

## 2023-10-29 DIAGNOSIS — H2511 Age-related nuclear cataract, right eye: Secondary | ICD-10-CM | POA: Diagnosis not present

## 2023-10-30 DIAGNOSIS — Z961 Presence of intraocular lens: Secondary | ICD-10-CM | POA: Diagnosis not present

## 2023-10-30 DIAGNOSIS — H18593 Other hereditary corneal dystrophies, bilateral: Secondary | ICD-10-CM | POA: Diagnosis not present

## 2023-10-30 DIAGNOSIS — H0102A Squamous blepharitis right eye, upper and lower eyelids: Secondary | ICD-10-CM | POA: Diagnosis not present

## 2023-10-30 DIAGNOSIS — H0102B Squamous blepharitis left eye, upper and lower eyelids: Secondary | ICD-10-CM | POA: Diagnosis not present

## 2023-10-30 DIAGNOSIS — H04123 Dry eye syndrome of bilateral lacrimal glands: Secondary | ICD-10-CM | POA: Diagnosis not present

## 2023-10-30 DIAGNOSIS — H2511 Age-related nuclear cataract, right eye: Secondary | ICD-10-CM | POA: Diagnosis not present

## 2023-10-30 DIAGNOSIS — H02423 Myogenic ptosis of bilateral eyelids: Secondary | ICD-10-CM | POA: Diagnosis not present

## 2023-10-30 DIAGNOSIS — H43813 Vitreous degeneration, bilateral: Secondary | ICD-10-CM | POA: Diagnosis not present

## 2023-10-30 DIAGNOSIS — H47012 Ischemic optic neuropathy, left eye: Secondary | ICD-10-CM | POA: Diagnosis not present

## 2023-10-31 DIAGNOSIS — L821 Other seborrheic keratosis: Secondary | ICD-10-CM | POA: Diagnosis not present

## 2023-10-31 DIAGNOSIS — L570 Actinic keratosis: Secondary | ICD-10-CM | POA: Diagnosis not present

## 2023-10-31 DIAGNOSIS — D225 Melanocytic nevi of trunk: Secondary | ICD-10-CM | POA: Diagnosis not present

## 2023-11-01 DIAGNOSIS — H2511 Age-related nuclear cataract, right eye: Secondary | ICD-10-CM | POA: Diagnosis not present

## 2023-11-14 DIAGNOSIS — M2560 Stiffness of unspecified joint, not elsewhere classified: Secondary | ICD-10-CM | POA: Diagnosis not present

## 2023-11-14 DIAGNOSIS — M109 Gout, unspecified: Secondary | ICD-10-CM | POA: Diagnosis not present

## 2023-11-14 DIAGNOSIS — E663 Overweight: Secondary | ICD-10-CM | POA: Diagnosis not present

## 2023-11-14 DIAGNOSIS — M79642 Pain in left hand: Secondary | ICD-10-CM | POA: Diagnosis not present

## 2023-11-14 DIAGNOSIS — M2540 Effusion, unspecified joint: Secondary | ICD-10-CM | POA: Diagnosis not present

## 2023-11-14 DIAGNOSIS — M353 Polymyalgia rheumatica: Secondary | ICD-10-CM | POA: Diagnosis not present

## 2023-11-14 DIAGNOSIS — Z6827 Body mass index (BMI) 27.0-27.9, adult: Secondary | ICD-10-CM | POA: Diagnosis not present

## 2023-11-14 DIAGNOSIS — M1120 Other chondrocalcinosis, unspecified site: Secondary | ICD-10-CM | POA: Diagnosis not present

## 2023-11-14 DIAGNOSIS — M79641 Pain in right hand: Secondary | ICD-10-CM | POA: Diagnosis not present

## 2023-11-19 ENCOUNTER — Encounter: Payer: Self-pay | Admitting: Family Medicine

## 2023-11-20 ENCOUNTER — Other Ambulatory Visit: Payer: Self-pay | Admitting: Rheumatology

## 2023-11-20 DIAGNOSIS — M1120 Other chondrocalcinosis, unspecified site: Secondary | ICD-10-CM

## 2023-12-21 DIAGNOSIS — I480 Paroxysmal atrial fibrillation: Secondary | ICD-10-CM | POA: Diagnosis not present

## 2023-12-21 DIAGNOSIS — G4719 Other hypersomnia: Secondary | ICD-10-CM | POA: Diagnosis not present

## 2023-12-21 DIAGNOSIS — E611 Iron deficiency: Secondary | ICD-10-CM | POA: Diagnosis not present

## 2023-12-21 DIAGNOSIS — E7800 Pure hypercholesterolemia, unspecified: Secondary | ICD-10-CM | POA: Diagnosis not present

## 2023-12-21 DIAGNOSIS — F17200 Nicotine dependence, unspecified, uncomplicated: Secondary | ICD-10-CM | POA: Diagnosis not present

## 2023-12-21 DIAGNOSIS — E559 Vitamin D deficiency, unspecified: Secondary | ICD-10-CM | POA: Diagnosis not present

## 2023-12-21 DIAGNOSIS — G4733 Obstructive sleep apnea (adult) (pediatric): Secondary | ICD-10-CM | POA: Diagnosis not present

## 2023-12-21 DIAGNOSIS — I10 Essential (primary) hypertension: Secondary | ICD-10-CM | POA: Diagnosis not present

## 2023-12-21 DIAGNOSIS — D649 Anemia, unspecified: Secondary | ICD-10-CM | POA: Diagnosis not present

## 2023-12-21 DIAGNOSIS — E790 Hyperuricemia without signs of inflammatory arthritis and tophaceous disease: Secondary | ICD-10-CM | POA: Diagnosis not present

## 2023-12-21 DIAGNOSIS — Z23 Encounter for immunization: Secondary | ICD-10-CM | POA: Diagnosis not present

## 2024-01-07 DIAGNOSIS — M353 Polymyalgia rheumatica: Secondary | ICD-10-CM | POA: Diagnosis not present

## 2024-01-07 DIAGNOSIS — E663 Overweight: Secondary | ICD-10-CM | POA: Diagnosis not present

## 2024-01-07 DIAGNOSIS — M2540 Effusion, unspecified joint: Secondary | ICD-10-CM | POA: Diagnosis not present

## 2024-01-07 DIAGNOSIS — M79642 Pain in left hand: Secondary | ICD-10-CM | POA: Diagnosis not present

## 2024-01-07 DIAGNOSIS — M79641 Pain in right hand: Secondary | ICD-10-CM | POA: Diagnosis not present

## 2024-01-07 DIAGNOSIS — M109 Gout, unspecified: Secondary | ICD-10-CM | POA: Diagnosis not present

## 2024-01-07 DIAGNOSIS — Z6827 Body mass index (BMI) 27.0-27.9, adult: Secondary | ICD-10-CM | POA: Diagnosis not present

## 2024-01-07 DIAGNOSIS — M2560 Stiffness of unspecified joint, not elsewhere classified: Secondary | ICD-10-CM | POA: Diagnosis not present

## 2024-01-07 DIAGNOSIS — M1120 Other chondrocalcinosis, unspecified site: Secondary | ICD-10-CM | POA: Diagnosis not present

## 2024-02-12 ENCOUNTER — Other Ambulatory Visit: Payer: Self-pay | Admitting: Cardiology

## 2024-02-12 DIAGNOSIS — I480 Paroxysmal atrial fibrillation: Secondary | ICD-10-CM

## 2024-02-21 ENCOUNTER — Ambulatory Visit
Admission: RE | Admit: 2024-02-21 | Discharge: 2024-02-21 | Disposition: A | Discharge status: Home / Self Care | Source: Ambulatory Visit | Attending: Acute Care | Admitting: Acute Care

## 2024-02-21 DIAGNOSIS — F17210 Nicotine dependence, cigarettes, uncomplicated: Secondary | ICD-10-CM

## 2024-02-21 DIAGNOSIS — Z122 Encounter for screening for malignant neoplasm of respiratory organs: Secondary | ICD-10-CM

## 2024-02-21 DIAGNOSIS — Z87891 Personal history of nicotine dependence: Secondary | ICD-10-CM

## 2024-03-19 DIAGNOSIS — I10 Essential (primary) hypertension: Secondary | ICD-10-CM | POA: Diagnosis not present

## 2024-03-19 DIAGNOSIS — T304 Corrosion of unspecified body region, unspecified degree: Secondary | ICD-10-CM | POA: Diagnosis not present

## 2024-03-19 DIAGNOSIS — G4719 Other hypersomnia: Secondary | ICD-10-CM | POA: Diagnosis not present

## 2024-03-24 ENCOUNTER — Encounter: Payer: Self-pay | Admitting: Cardiology

## 2024-03-24 DIAGNOSIS — I480 Paroxysmal atrial fibrillation: Secondary | ICD-10-CM

## 2024-03-24 NOTE — Telephone Encounter (Signed)
 A-fib was previously well-controlled on Multaq .  Continue Eliquis .  Recommend 2-week ZIO monitor to assess A-fib burden.  Recommend follow-up after that.  Thanks MJP

## 2024-03-25 ENCOUNTER — Ambulatory Visit: Attending: Cardiology

## 2024-03-25 DIAGNOSIS — I480 Paroxysmal atrial fibrillation: Secondary | ICD-10-CM

## 2024-03-25 NOTE — Progress Notes (Unsigned)
 Enrolled patient for a 14 day Zio XT  monitor to be mailed to patients home

## 2024-03-27 ENCOUNTER — Telehealth: Payer: Self-pay | Admitting: Acute Care

## 2024-03-27 DIAGNOSIS — Z87891 Personal history of nicotine dependence: Secondary | ICD-10-CM

## 2024-03-27 DIAGNOSIS — R911 Solitary pulmonary nodule: Secondary | ICD-10-CM

## 2024-03-27 NOTE — Telephone Encounter (Signed)
 LVM to call office and review results.

## 2024-03-27 NOTE — Telephone Encounter (Signed)
 Richard Ear NP has reviewed the pt's most recent LDCT and has advised due to nodule growth from 9.22mm to 13.18mm since last years scan Richard Lynch has advised the pt have a 6 month follow up scan to re-assess. Will need to call patient and send results/plan to PCP.    IMPRESSION: 1. Lung-RADS 2, benign appearance or behavior. Continue annual screening with low-dose chest CT without contrast in 12 months. 2. Chronic gastric wall thickening. 3. Aortic atherosclerosis (ICD10-I70.0). Coronary artery calcification. 4.  Emphysema (ICD10-J43.9).     Electronically Signed   By: Shearon Denis M.D.   On: 03/19/2024 11:19

## 2024-03-27 NOTE — Telephone Encounter (Signed)
 Spoke with pt and reviewed lung screening CT per Dara Ear, NP. Advised pt that we will repeat CT in 6 months to look at nodule growth again. Pt verbalized understanding. CT scheduled 08/25/2024 9:40.

## 2024-04-08 DIAGNOSIS — E663 Overweight: Secondary | ICD-10-CM | POA: Diagnosis not present

## 2024-04-08 DIAGNOSIS — M109 Gout, unspecified: Secondary | ICD-10-CM | POA: Diagnosis not present

## 2024-04-08 DIAGNOSIS — M353 Polymyalgia rheumatica: Secondary | ICD-10-CM | POA: Diagnosis not present

## 2024-04-08 DIAGNOSIS — M1120 Other chondrocalcinosis, unspecified site: Secondary | ICD-10-CM | POA: Diagnosis not present

## 2024-04-08 DIAGNOSIS — Z6828 Body mass index (BMI) 28.0-28.9, adult: Secondary | ICD-10-CM | POA: Diagnosis not present

## 2024-04-11 ENCOUNTER — Telehealth: Payer: Self-pay | Admitting: Cardiology

## 2024-04-11 MED ORDER — DILTIAZEM HCL 30 MG PO TABS: ORAL_TABLET | ORAL | 1 refills | Status: DC

## 2024-04-11 NOTE — Telephone Encounter (Signed)
 Pt's medication was sent to pt's pharmacy as requested. Confirmation received.

## 2024-04-11 NOTE — Telephone Encounter (Signed)
*  STAT* If patient is at the pharmacy, call can be transferred to refill team.   1. Which medications need to be refilled? (please list name of each medication and dose if known) Diltiazem    2. Would you like to learn more about the convenience, safety, & potential cost savings by using the [facility]?     3. Are you open to using the [facility] (Type [facility].  4. Which pharmacy/location (including street and city if local pharmacy) is medication to be sent to? [facility] [place], [place]   5. Do they need a 30 day or 90 day supply? 60 days and refills

## 2024-04-29 DIAGNOSIS — I480 Paroxysmal atrial fibrillation: Secondary | ICD-10-CM | POA: Diagnosis not present

## 2024-05-03 ENCOUNTER — Other Ambulatory Visit: Payer: Self-pay | Admitting: Cardiology

## 2024-05-05 ENCOUNTER — Telehealth: Payer: Self-pay | Admitting: Cardiology

## 2024-05-05 DIAGNOSIS — I480 Paroxysmal atrial fibrillation: Secondary | ICD-10-CM

## 2024-05-05 NOTE — Telephone Encounter (Signed)
*  STAT* If patient is at the pharmacy, call can be transferred to refill team.   1. Which medications need to be refilled? (please list name of each medication and dose if known)   apixaban  (ELIQUIS ) 5 MG TABS tablet   2. Would you like to learn more about the convenience, safety, & potential cost savings by using the [facility]?   3. Are you open to using the [facility] ([facility]. ).  4. Which pharmacy/location (including street and city if local pharmacy) is medication to be sent to?  [id number] [place], [place] - [address]   5. Do they need a 30 day or 90 day supply?   90 day  Patient stated he still has some medication.

## 2024-05-06 ENCOUNTER — Ambulatory Visit (INDEPENDENT_AMBULATORY_CARE_PROVIDER_SITE_OTHER): Payer: Medicare Other | Admitting: Neurology

## 2024-05-06 ENCOUNTER — Encounter: Payer: Self-pay | Admitting: Neurology

## 2024-05-06 ENCOUNTER — Other Ambulatory Visit

## 2024-05-06 VITALS — BP 145/73 | HR 87 | Ht 70.0 in | Wt 193.0 lb

## 2024-05-06 DIAGNOSIS — M4802 Spinal stenosis, cervical region: Secondary | ICD-10-CM

## 2024-05-06 DIAGNOSIS — G629 Polyneuropathy, unspecified: Secondary | ICD-10-CM

## 2024-05-06 DIAGNOSIS — G5623 Lesion of ulnar nerve, bilateral upper limbs: Secondary | ICD-10-CM | POA: Diagnosis not present

## 2024-05-06 DIAGNOSIS — R413 Other amnesia: Secondary | ICD-10-CM

## 2024-05-06 DIAGNOSIS — M353 Polymyalgia rheumatica: Secondary | ICD-10-CM | POA: Diagnosis not present

## 2024-05-06 MED ORDER — GABAPENTIN 300 MG PO CAPS: 1200.0000 mg | ORAL_CAPSULE | ORAL | 3 refills | Status: AC

## 2024-05-06 MED ORDER — APIXABAN 5 MG PO TABS: 5.0000 mg | ORAL_TABLET | Freq: Two times a day (BID) | ORAL | 5 refills | Status: DC

## 2024-05-06 NOTE — Progress Notes (Signed)
 Follow-up Visit   Date: 05/06/24   Richard Lynch MRN: [id number] DOB: [birth year]   Interim History: Richard Lynch is a 73 y.o. left-handed Caucasian male with hypertension, hyperlipidemia, and tobacco use returning to the clinic for follow-up of polyneuropathy.  The patient was accompanied to the clinic by self.  IMPRESSION/PLAN: Chronic right hand weakness and paresthesias due to overlapping C8 radiculopathy and entrapment neuropathies. Exam is stable. 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 He has been off IVIG since December 2021.   He has sought second opinion at [facility] and [facility], who agree that his hand paresthesias and weakness is mostly secondary to C8 radiculopathy with overlapping entrapment neuropathy.  He saw Neurosurgery at [facility] who advised that he is high risk for cervical surgery and recommended conservative therapies.  Management remain supportive.   For pain, he takes gabapentin  1200mg  at bedtime and reports pain is well-controlled.   He can try taking lower dose of 900mg  at bedtime. Muscle jerks are most likely side effect of gabapentin , they are no bothersome enough to lower the dose.   Memory changes (subjective).  He scored very well on MOCA (28/30), but remains concerned about dementia because of mother had it.   - He was reassured that he does not have any signs of dementia.   - MRI brain wo contrast  - Formal neuropsychological testing  - Check vitamin B12, folate, and B1, TSH   Return to clinic in 1 year --------------------------------------------------------------------------------------------  UPDATE 07/24/2022:  He feels that his hands are continuing to get weaker and often drops objects.  He denies any worsening numbness/tingling.  He also complains of chronic neck pain and stiffness.  He is not taking  tizanidine , but does not recall why he stopped it.  Sometimes, he has shooting pain towards his head.  Balance is also poor.  No falls, walks unassisted.  Pain remains relatively controlled on gabapentin  1200mg  at bedtime.    UPDATE 05/07/2023:  He is here for follow-up visit.  He reports pain is controlled on gabapentin  1200mg  at bedtime.  He has noticed episodic muscle jerks of the hands. He continues to be frustrated by the lack of treatable condition or anything that can prevent the weakness in the hands.  He has seen two neurosurgeons, but who did not feel surgery was an option.    He is also complains of lightheadedness and fatigue.  His BP is low in the office today 90/41 and rechecked 109/61.  He states home BP readings are also staying in 90s-100s.  UPDATE 05/06/2024:  He is here for follow-up visit.  There has been no significant change in his weakness and pain has improved since being on prednisone  (prescribed by rheumatology). He continues to take gabapentin  1200mg  at bedtime.    Today, he also complains of difficulty with learning new information and concentration.  He has started three new businesses and is concerned that he cannot remember details and tasks as well as he could before.  He denies any problems with driving, finances, or medication management.  His mother had Alzheimer's dementia and he is very concerned about this. He would like MRI brain to further evaluate symptoms.    Medications:  Current Outpatient Medications on File Prior to Visit  Medication Sig Dispense Refill   Acetaminophen  500 MG capsule 1 capsule as needed Orally every 6  hrs     allopurinol (ZYLOPRIM) 100 MG tablet Take 200 mg by mouth daily. (Patient taking differently: Take 100 mg by mouth daily.)     amphetamine-dextroamphetamine (ADDERALL) 20 MG tablet Take 20-30 mg by mouth daily as needed (sleepiness).     apixaban  (ELIQUIS ) 5 MG TABS tablet Take 1 tablet (5 mg total) by mouth 2 (two) times daily. 60  tablet 5   Ascorbic Acid  (VITAMIN C  PO) Take 600 mg by mouth daily.     aspirin  EC 81 MG tablet Take 1 tablet (81 mg total) by mouth daily. 90 tablet 3   B Complex CAPS 1 capsule Orally once a day     candesartan (ATACAND) 32 MG tablet Take 32 mg by mouth daily.      chlorthalidone  (HYGROTON ) 25 MG tablet Take 25 mg by mouth daily.     Cholecalciferol  (VITAMIN D3) 50 MCG (2000 UT) capsule Take 2,000 Units by mouth daily.     Coenzyme Q10 (COQ-10 PO) Take 100-300 mg by mouth daily.     Cyanocobalamin  (VITAMIN B-12) 5000 MCG SUBL Place 10,000 mcg under the tongue daily.     diltiazem  (CARDIZEM ) 30 MG tablet TAKE 1 TABLET EVERY 4 HOURS AS NEEDED FOR AFIB HEART RATE >100 360 tablet 1   dronedarone  (MULTAQ ) 400 MG tablet Take 1 tablet (400 mg total) by mouth 2 (two) times daily with a meal. 180 tablet 2   isosorbide  mononitrate (IMDUR ) 30 MG 24 hr tablet TAKE 1 TABLET BY MOUTH EVERY DAY 90 tablet 3   Multiple Vitamin (MULTIVITAMIN WITH MINERALS) TABS tablet Take 1 tablet by mouth daily. Centrum silver     Omega-3 Fatty Acids (FISH OIL) 1200 MG CAPS 1 capsule Orally Once a day     prednisoLONE (ORAPRED) 15 MG/5ML solution Take 15 mg by mouth daily before breakfast. (Patient taking differently: Take 11 mg by mouth daily before breakfast.)     rosuvastatin (CRESTOR) 20 MG tablet Take 20 mg by mouth daily.     senna-docusate (SENOKOT-S) 8.6-50 MG tablet Take 1 tablet by mouth at bedtime as needed for mild constipation. 100 tablet 0   SYMBICORT 160-4.5 MCG/ACT inhaler Inhale 2 puffs into the lungs daily as needed (lungs).     Turmeric (QC TUMERIC COMPLEX PO) Take 1,500 mg by mouth daily.     valACYclovir  (VALTREX ) 500 MG tablet Take 500 mg by mouth daily.     Zinc Sulfate (ZINC 15 PO) Take 50 mg by mouth daily.     Ascorbic Acid  500 MG CAPS 1 tablet Orally Once a day (Patient not taking: Reported on 05/06/2024)     atorvastatin (LIPITOR) 40 MG tablet Take 40 mg by mouth daily. (Patient not taking:  Reported on 05/06/2024)     B Complex-C (SUPER B COMPLEX PO) Take 1 tablet by mouth daily. (Patient not taking: Reported on 05/06/2024)     Magnesium  Glycinate 120 MG CAPS 3 capsules Orally once a day (Patient not taking: Reported on 05/06/2024)     nicotine  (NICODERM CQ  - DOSED IN MG/24 HOURS) 14 mg/24hr patch Place 1 patch (14 mg total) onto the skin daily. (Patient not taking: Reported on 05/06/2024) 28 patch 2   nicotine  polacrilex (NICORETTE ) 2 MG gum Take 1 each (2 mg total) by mouth as needed for smoking cessation. (Patient not taking: Reported on 05/06/2024) 100 tablet 0   oxyCODONE -acetaminophen  (PERCOCET/ROXICET) 5-325 MG tablet Take 1 tablet by mouth every 6 (six) hours as needed for severe pain (pain score  7-10). 15 tablet 0   tiZANidine  (ZANAFLEX ) 4 MG tablet Take 4 mg by mouth at bedtime. (Patient not taking: Reported on 05/06/2024)     No current facility-administered medications on file prior to visit.    Allergies: No Known Allergies   Vital Signs:  BP (!) 145/73   Pulse 87   Ht 5' 10 (1.778 m)   Wt 193 lb (87.5 kg)   SpO2 94%   BMI 27.69 kg/m     Neurological Exam: MENTAL STATUS including orientation to time, place, person, recent and remote memory, attention span and concentration, language, and fund of knowledge is normal.  Speech is not dysarthric.    05/06/2024    3:00 PM  Montreal Cognitive Assessment   Visuospatial/ Executive (0/5) 3  Naming (0/3) 3  Attention: Read list of digits (0/2) 2  Attention: Read list of letters (0/1) 1  Attention: Serial 7 subtraction starting at 100 (0/3) 3  Language: Repeat phrase (0/2) 2  Language : Fluency (0/1) 1  Abstraction (0/2) 2  Delayed Recall (0/5) 5  Orientation (0/6) 6  Total 28  Adjusted Score (based on education) 28    CRANIAL NERVES:  Pupils are round and reactive.  Extraocular muscles intact. Face is symmetric.  No ptosis.  MOTOR: Marked atrophy of the left intrinsic hand muscles and forearm. Left claw hand  deformity. No  fasciculations or abnormal movements.    Upper Extremity:  Right  Left  Deltoid  5/5   5/5   Biceps  5/5   5/5   Triceps  5/5   5/5   Wrist extensors  5/5   5/5   Wrist flexors  5/5   5/5   Finger extensors  5/5   2+/5   Finger flexors  5/5   5-/5   Dorsal interossei  5/5   3+/5   Abductor pollicis  5/5   1-/5   Tone (Ashworth scale)  0  0   Lower extremities 5/5 throughout   MSRs:                                           Right        Left brachioradialis 2+  2+  biceps 2+  2+  triceps 2+  2+  patellar 1+  2+  ankle jerk 0  0   SENSORY:  Vibration is reduced to at the ankles. Temperature remains reduced over the palms/fingers.  COORDINATION/GAIT: Gait narrow based and stable  Data: DATA: NCS/EMG of the legs 04/17/2019: The electrophysiologic findings are consistent with a sensorimotor polyneuropathy, demyelinating and axonal loss in type affecting the lower extremities.  The presence of sural-sparing suggests findings may be associated with a polyradiculoneuropathy, correlate clinically.  NCS/EMG of the arms 04/01/2019 This is a complex study of the upper extremities.  Findings are as follows:  Bilateral median neuropathy at or distal to the wrist (severe), consistent with a clinical diagnosis of carpal tunnel syndrome.   Bilateral ulnar neuropathy with slowing across the elbow, mild on the right and severe on the left. Chronic left posterior interosseous neuropathy, very severe.  Chronic C7-8 radiculopathy affecting bilateral upper extremities, moderate in degree electrically.  MRI cervical spine wo contrast 07/04/2018: 1. C5-T3 posterior-lateral fusion with decompressive laminectomy from C5-6 to T1-2. There is no arthrodesis by CT and the C5 and C6 lateral mass screws are loose. 2.  Disc and facet degeneration causes multilevel foraminal impingement. Greatest foraminal impingement is seen on the left at C2-3, bilaterally at C3-4, right at C5-6, and right at  T1-2. 3. Widely patent canal at levels of laminectomy. Noncompressive spinal stenosis seen at C3-4 and C4-5. 4. Septated fluid collection within the lower laminectomy defect without dural mass effect, best attributed to seroma.  MRI thoracic spine wo contrast 07/05/2018: Cervical and thoracic fusion and laminectomy. 15 mm fluid collection in the laminectomy bed. No cord compression or cord signal abnormality. Left paracentral disc protrusion T7-8 unchanged from the prior MRI.  CSF testing 05/08/2019:   W0 R1 G73 P105*, 2 oligoclonal bands, normal IgG index, MBP and ACE  Labs 04/17/2019: ESR 6, vitamin B1 28, TSH 1.26, copper  106, SPEP with IFE no M protein, vitamin B12 >2000, folate 19.6  NCS/EMG and US  of the upper extremities 10/05/2021: There was electrodiagnostic and sonographic evidence of severe left median neuropathy at the wrist (carpal tunnel syndrome), left ulnar neuropathy with sonographic localization at the elbow as well as a left subchronic C6-8 cervical radiculopathy. There was no electrodiagnostic evidence of a diffuse large fiber polyneuropathy. There appears to also be sonographic evidence consistent with right median neuropathy at the wrist, as may be seen in carpal tunnel syndrome. This was not fully explored on this study given the extensive nature of prior testing and clinical question asked, but could be explored further on a future study if clinically indicated. Clinical correlation advised.    Total time spent reviewing records, interview, history/exam, documentation, and coordination of care on day of encounter:  40 minutes   Thank you for allowing me to participate in patient's care.  If I can answer any additional questions, I would be pleased to do so.    Sincerely,    Leiam Hopwood K. Tobie, DO

## 2024-05-06 NOTE — Telephone Encounter (Signed)
 Prescription refill request for Eliquis  received. Indication: a fib Last office visit: 09/25/23 Scr: 1.32 epic 08/31/23 Age: 73 Weight: 85kg

## 2024-05-11 DIAGNOSIS — I480 Paroxysmal atrial fibrillation: Secondary | ICD-10-CM | POA: Diagnosis not present

## 2024-05-11 NOTE — Progress Notes (Unsigned)
 Cardiology Office Note:  .   Date:  05/11/2024  ID:  Richard Lynch, DOB [birth year], MRN [id number] PCP: Dayna Motto, DO  [facility] Providers Cardiologist:  Newman Lawrence, MD PCP: Dayna Motto, DO  No chief complaint on file.     History of Present Illness: .    Richard Lynch is a 73 y.o. male with hypertension, hyperlipidemia, CAD, PAD, paroxysmal A-fib, OSA on CPAP, CIDP   Patient is doing well from cardiac standpoint, denies any chest pain or shortness of breath symptoms.  Continues to have some degree of lightheadedness.  Blood pressure remains below normal.  There were no vitals filed for this visit.    ROS:  Review of Systems  Cardiovascular:  Negative for chest pain, dyspnea on exertion, leg swelling, palpitations and syncope.     Studies Reviewed: SABRA        EKG 09/25/2023: Sinus tachycardia When compared with ECG of 11-Apr-2022 05:59, No significant change was found  Zio patch monitor 11 days 04/05/2024 - 04/16/2024: Dominant rhythm: Sinus. HR 56-123 bpm. Avg HR 81 bpm in sinus rhythm. Atrial Fibrillation/Flutter occurred (2% burden), ranging from 96-234 bpm (avg of 161 bpm), the longest lasting 1 hour 26 mins with an avg rate of 158 bpm. Ventricular Tachycardia and Atrial Fibrillation/Flutter were detected within +/- 45 seconds of symptomatic patient event(s). 16 episodes of VT or Afib w/aberrancy, fastest and longest at 240 bpm for 6 beats. <1% isolated VE, couplet/triplets. 1 episodes of atrial tachycardia fastest at 135 bpm for 14 beats. <1% isolated SVE, couplet/triplets. No high grade AV block, sinus pause >3sec noted. 13 patient triggered events, correlated with Afib/flutter, SVE, VE.  Labs 06/2023: Chol 114, TG 106, HDL 44, LDL 50 HbA1C 6.2% TSH 1.2 Cr 1.3     Risk Assessment/Calculations:    CHA2DS2-VASc Score = 33  This indicates a 3.23.2% annual risk of stroke. The patient's score is based upon: CHF History: 0 HTN History:  1 Diabetes History: 0 Stroke History: 0 Vascular Disease History: 1 Age Score: 1 Gender Score: 0    Physical Exam:   Physical Exam Vitals and nursing note reviewed.  Constitutional:      General: He is not in acute distress. Neck:     Vascular: No JVD.  Cardiovascular:     Rate and Rhythm: Normal rate and regular rhythm.     Heart sounds: Normal heart sounds. No murmur heard. Pulmonary:     Effort: Pulmonary effort is normal.     Breath sounds: Normal breath sounds. No wheezing or rales.      VISIT DIAGNOSES: No diagnosis found.    ASSESSMENT AND PLAN: .    Richard Lynch is a 73 y.o. male with  hypertension, hyperlipidemia, CAD, PAD,  paroxysmal A-fib, OSA on CPAP, CIDP   Coronary artery disease, Single-vessel disease with severe mid RCA stenosis with adjacent ectasia. Good exercise capacity without chest pain, but areas of medium size mild ischemia in distal RCA territory noted on stress testing 01/2022. Currently, he has no specific chest pain symptoms.  With rhythm control of A-fib with Multaq , he has not had any significant decreased exercise tolerance symptoms either.  Continue current anti anginal therapy and AAD. Continue aspirin  81 mg daily, Imdur  30 mg daily, metoprolol  succinate 50 mg daily, Multaq  400 mg twice daily, Given that his blood pressure remains low normal, associated with some degree of lightheadedness, I will stop his chlorthalidone  completely today.  If blood pressure continues to stay  low normal, we can reduce candesartan down from 32 mg daily to 16 mg daily, in future.  Patient will send me a MyChart message in 4 weeks to update me about his blood pressure readings.   PAF: No recurrence of A-fib episode, as monitored by patient's Apple Watch, since being on Multaq  400 mg daily. CHA2DS2VAS score 3, annual stroke risk 3.2% Continue Eliquis  5 mg twice daily   Nicotine  dependence: Tobacco cessation counseling:  - Currently smoking 1 packs/day    - Patient was informed of the dangers of tobacco abuse including stroke, cancer, and MI, as well as benefits of tobacco cessation. - Patient is willing to quit at this time. - Approximately 5 mins were spent counseling patient cessation techniques. We discussed various methods to help quit smoking, including deciding on a date to quit, joining a support group, pharmacological agents. Patient would like to use nicotine  patch. - I will reassess his progress at the next follow-up visit      F/u in 6 months  Signed, Newman JINNY Lawrence, MD

## 2024-05-12 ENCOUNTER — Ambulatory Visit: Payer: Self-pay | Admitting: Neurology

## 2024-05-12 LAB — TSH: TSH: 0.74 m[IU]/L (ref 0.40–4.50)

## 2024-05-12 LAB — VITAMIN B1: Vitamin B1 (Thiamine): 73 nmol/L — ABNORMAL HIGH (ref 8–30)

## 2024-05-12 LAB — FOLATE: Folate: >24 ng/mL

## 2024-05-12 LAB — VITAMIN B12: Vitamin B-12: 1690 pg/mL — ABNORMAL HIGH (ref 200–1100)

## 2024-05-13 ENCOUNTER — Encounter: Payer: Self-pay | Admitting: Cardiology

## 2024-05-13 ENCOUNTER — Ambulatory Visit: Attending: Cardiology | Admitting: Cardiology

## 2024-05-13 VITALS — BP 138/72 | HR 82 | Ht 70.0 in | Wt 195.0 lb

## 2024-05-13 DIAGNOSIS — I480 Paroxysmal atrial fibrillation: Secondary | ICD-10-CM | POA: Diagnosis not present

## 2024-05-13 DIAGNOSIS — F17210 Nicotine dependence, cigarettes, uncomplicated: Secondary | ICD-10-CM | POA: Insufficient documentation

## 2024-05-13 DIAGNOSIS — I2510 Atherosclerotic heart disease of native coronary artery without angina pectoris: Secondary | ICD-10-CM | POA: Insufficient documentation

## 2024-05-13 MED ORDER — METOPROLOL SUCCINATE ER 25 MG PO TB24: 25.0000 mg | ORAL_TABLET | Freq: Every evening | ORAL | 3 refills | Status: DC

## 2024-05-13 MED ORDER — NICOTINE 21 MG/24HR TD PT24: 21.0000 mg | MEDICATED_PATCH | Freq: Every day | TRANSDERMAL | 3 refills | Status: DC

## 2024-05-13 MED ORDER — NICOTINE 7 MG/24HR TD PT24: 7.0000 mg | MEDICATED_PATCH | Freq: Every day | TRANSDERMAL | 3 refills | Status: DC

## 2024-05-13 NOTE — Patient Instructions (Addendum)
 Medication Instructions:  START Metoprolol  Succinate 25 mg take one tablet by mouth at nightly  START Nicotine  patch 21 mg   *If you need a refill on your cardiac medications before your next appointment, please call your pharmacy*  Testing/Procedures: Echo  Your physician has requested that you have an echocardiogram. Echocardiography is a painless test that uses sound waves to create images of your heart. It provides your doctor with information about the size and shape of your heart and how well your heart's chambers and valves are working. This procedure takes approximately one hour. There are no restrictions for this procedure. Please do NOT wear cologne, perfume, aftershave, or lotions (deodorant is allowed). Please arrive 15 minutes prior to your appointment time.  Please note: We ask at that you not bring children with you during ultrasound (echo/ vascular) testing. Due to room size and safety concerns, children are not allowed in the ultrasound rooms during exams. Our front office staff cannot provide observation of children in our lobby area while testing is being conducted. An adult accompanying a patient to their appointment will only be allowed in the ultrasound room at the discretion of the ultrasound technician under special circumstances. We apologize for any inconvenience.   Exercise nuclear stress test   Your physician has requested that you have en exercise stress myoview . For further information please visit [email or web address] Please follow instruction sheet, as given.   Follow-Up: At [facility], you and your health needs are our priority.  As part of our continuing mission to provide you with exceptional heart care, our providers are all part of one team.  This team includes your primary Cardiologist (physician) and Advanced Practice Providers or APPs (Physician Assistants and Nurse Practitioners) who all work together to provide you with the care you need, when  you need it.  Your next appointment:   6 week(s)  Provider:   Newman JINNY Lawrence, MD

## 2024-05-20 ENCOUNTER — Encounter (HOSPITAL_COMMUNITY): Payer: Self-pay | Admitting: *Deleted

## 2024-05-20 ENCOUNTER — Telehealth (HOSPITAL_COMMUNITY): Payer: Self-pay | Admitting: *Deleted

## 2024-05-20 NOTE — Telephone Encounter (Signed)
 Instructions for upcoming stress test sent via [employer].  Argentina Bees, RN

## 2024-05-23 ENCOUNTER — Other Ambulatory Visit: Payer: Self-pay | Admitting: Cardiology

## 2024-05-23 ENCOUNTER — Ambulatory Visit (HOSPITAL_COMMUNITY)

## 2024-05-23 DIAGNOSIS — I480 Paroxysmal atrial fibrillation: Secondary | ICD-10-CM

## 2024-05-23 DIAGNOSIS — I2510 Atherosclerotic heart disease of native coronary artery without angina pectoris: Secondary | ICD-10-CM

## 2024-05-26 ENCOUNTER — Ambulatory Visit (HOSPITAL_COMMUNITY)
Admission: RE | Admit: 2024-05-26 | Discharge: 2024-05-26 | Disposition: A | Discharge status: Home / Self Care | Source: Ambulatory Visit | Attending: Cardiology | Admitting: Cardiology

## 2024-05-26 DIAGNOSIS — I2510 Atherosclerotic heart disease of native coronary artery without angina pectoris: Secondary | ICD-10-CM | POA: Insufficient documentation

## 2024-05-26 DIAGNOSIS — I480 Paroxysmal atrial fibrillation: Secondary | ICD-10-CM | POA: Insufficient documentation

## 2024-05-26 MED ORDER — TECHNETIUM TC 99M TETROFOSMIN IV KIT
10.5000 | PACK | Freq: Once | INTRAVENOUS | Status: AC | PRN
  Administered 2024-05-26: 10.5 via INTRAVENOUS

## 2024-05-26 MED ORDER — TECHNETIUM TC 99M TETROFOSMIN IV KIT
32.9000 | PACK | Freq: Once | INTRAVENOUS | Status: AC | PRN
  Administered 2024-05-26: 32.9 via INTRAVENOUS

## 2024-05-27 ENCOUNTER — Ambulatory Visit: Payer: Self-pay | Admitting: Cardiology

## 2024-05-27 LAB — MYOCARDIAL PERFUSION IMAGING
Angina Index: 0
Estimated workload: 7
Exercise duration (min): 6 min
LV dias vol: 107 mL (ref 62–150)
LV sys vol: 32 mL (ref 4.2–5.8)
MPHR: 149 {beats}/min
Nuc Stress EF: 70 %
Peak HR: 137 {beats}/min
Percent HR: 91 %
RPE: 18
Rest HR: 73 {beats}/min
Rest Nuclear Isotope Dose: 10.5 mCi
SDS: 1
SRS: 15
SSS: 8
Stress Nuclear Isotope Dose: 32.9 mCi
TID: 0.97

## 2024-05-30 DIAGNOSIS — H18593 Other hereditary corneal dystrophies, bilateral: Secondary | ICD-10-CM | POA: Diagnosis not present

## 2024-05-30 DIAGNOSIS — H04123 Dry eye syndrome of bilateral lacrimal glands: Secondary | ICD-10-CM | POA: Diagnosis not present

## 2024-05-30 DIAGNOSIS — H47012 Ischemic optic neuropathy, left eye: Secondary | ICD-10-CM | POA: Diagnosis not present

## 2024-05-30 DIAGNOSIS — H02423 Myogenic ptosis of bilateral eyelids: Secondary | ICD-10-CM | POA: Diagnosis not present

## 2024-05-30 DIAGNOSIS — H43813 Vitreous degeneration, bilateral: Secondary | ICD-10-CM | POA: Diagnosis not present

## 2024-06-17 ENCOUNTER — Ambulatory Visit (HOSPITAL_COMMUNITY)
Admission: RE | Admit: 2024-06-17 | Discharge: 2024-06-17 | Disposition: A | Discharge status: Home / Self Care | Source: Ambulatory Visit | Attending: Cardiovascular Disease | Admitting: Cardiovascular Disease

## 2024-06-17 DIAGNOSIS — I2510 Atherosclerotic heart disease of native coronary artery without angina pectoris: Secondary | ICD-10-CM | POA: Diagnosis not present

## 2024-06-17 DIAGNOSIS — M25562 Pain in left knee: Secondary | ICD-10-CM | POA: Diagnosis not present

## 2024-06-17 DIAGNOSIS — Z6827 Body mass index (BMI) 27.0-27.9, adult: Secondary | ICD-10-CM | POA: Diagnosis not present

## 2024-06-17 DIAGNOSIS — G4719 Other hypersomnia: Secondary | ICD-10-CM | POA: Diagnosis not present

## 2024-06-17 DIAGNOSIS — I480 Paroxysmal atrial fibrillation: Secondary | ICD-10-CM | POA: Diagnosis not present

## 2024-06-17 DIAGNOSIS — M1120 Other chondrocalcinosis, unspecified site: Secondary | ICD-10-CM | POA: Diagnosis not present

## 2024-06-17 DIAGNOSIS — M353 Polymyalgia rheumatica: Secondary | ICD-10-CM | POA: Diagnosis not present

## 2024-06-17 DIAGNOSIS — R739 Hyperglycemia, unspecified: Secondary | ICD-10-CM | POA: Diagnosis not present

## 2024-06-17 DIAGNOSIS — G4733 Obstructive sleep apnea (adult) (pediatric): Secondary | ICD-10-CM | POA: Diagnosis not present

## 2024-06-17 DIAGNOSIS — E663 Overweight: Secondary | ICD-10-CM | POA: Diagnosis not present

## 2024-06-17 DIAGNOSIS — M109 Gout, unspecified: Secondary | ICD-10-CM | POA: Diagnosis not present

## 2024-06-17 LAB — ECHOCARDIOGRAM COMPLETE
Area-P 1/2: 3.6 cm2
S' Lateral: 3 cm

## 2024-06-26 DIAGNOSIS — R5383 Other fatigue: Secondary | ICD-10-CM | POA: Diagnosis not present

## 2024-06-26 DIAGNOSIS — Z23 Encounter for immunization: Secondary | ICD-10-CM | POA: Diagnosis not present

## 2024-06-26 DIAGNOSIS — I10 Essential (primary) hypertension: Secondary | ICD-10-CM | POA: Diagnosis not present

## 2024-06-26 DIAGNOSIS — R7303 Prediabetes: Secondary | ICD-10-CM | POA: Diagnosis not present

## 2024-06-26 DIAGNOSIS — I480 Paroxysmal atrial fibrillation: Secondary | ICD-10-CM | POA: Diagnosis not present

## 2024-06-26 DIAGNOSIS — F5101 Primary insomnia: Secondary | ICD-10-CM | POA: Diagnosis not present

## 2024-06-26 DIAGNOSIS — F17200 Nicotine dependence, unspecified, uncomplicated: Secondary | ICD-10-CM | POA: Diagnosis not present

## 2024-06-26 DIAGNOSIS — G4719 Other hypersomnia: Secondary | ICD-10-CM | POA: Diagnosis not present

## 2024-06-26 DIAGNOSIS — H8111 Benign paroxysmal vertigo, right ear: Secondary | ICD-10-CM | POA: Diagnosis not present

## 2024-06-26 DIAGNOSIS — E7800 Pure hypercholesterolemia, unspecified: Secondary | ICD-10-CM | POA: Diagnosis not present

## 2024-06-26 DIAGNOSIS — Z0000 Encounter for general adult medical examination without abnormal findings: Secondary | ICD-10-CM | POA: Diagnosis not present

## 2024-06-27 ENCOUNTER — Encounter: Payer: Self-pay | Admitting: Cardiology

## 2024-06-27 ENCOUNTER — Ambulatory Visit: Attending: Cardiology | Admitting: Cardiology

## 2024-06-27 VITALS — BP 128/78 | HR 74 | Resp 16 | Ht 70.0 in | Wt 194.8 lb

## 2024-06-27 DIAGNOSIS — F17210 Nicotine dependence, cigarettes, uncomplicated: Secondary | ICD-10-CM | POA: Diagnosis not present

## 2024-06-27 DIAGNOSIS — I480 Paroxysmal atrial fibrillation: Secondary | ICD-10-CM | POA: Insufficient documentation

## 2024-06-27 DIAGNOSIS — I2510 Atherosclerotic heart disease of native coronary artery without angina pectoris: Secondary | ICD-10-CM | POA: Insufficient documentation

## 2024-06-27 MED ORDER — METOPROLOL SUCCINATE ER 50 MG PO TB24: 50.0000 mg | ORAL_TABLET | Freq: Every evening | ORAL | 3 refills | Status: AC

## 2024-06-27 MED ORDER — NICOTINE 21 MG/24HR TD PT24: 21.0000 mg | MEDICATED_PATCH | Freq: Every day | TRANSDERMAL | 1 refills | Status: DC

## 2024-06-27 NOTE — Addendum Note (Signed)
 Addended by: ELMIRA NEWMAN PARAS on: 06/27/2024 02:40 PM   Modules accepted: Orders

## 2024-06-27 NOTE — Progress Notes (Signed)
 Cardiology Office Note:  .   Date:  06/27/2024  ID:  Richard Lynch, DOB October 21, 1951, MRN [id number] PCP: Dayna Motto, DO  [facility] Providers Cardiologist:  Newman Lawrence, MD PCP: Dayna Motto, DO  Chief Complaint  Patient presents with   Coronary artery disease involving native coronary artery of   Follow-up      History of Present Illness: .    Richard Lynch is a 73 y.o. male with hypertension, hyperlipidemia, CAD, PAD, paroxysmal A-fib, OSA on CPAP, CIDP, polymyalgia rheumatica  In mid 2025, patient noted episodes of tachycardia in the 180s lasting for 2 to 4 hours.  He wore Zio monitor for 11 days, that showed 2% A-fib/flutter burden, as well as episodes of wide-complex tachycardia possibly VT versus A-fib with aberrant conduction.  He denies any chest pain decreased exercise tolerance symptoms, but that said he has not been doing much in terms of physical activity.  He plays golf, but rides a cart rather than walking on the golf course.   Historically, patient recently underwent workup due to decreased exercise tolerance that showed A-fib, abnormal stress test, with proximal/mid RCA severe stenosis.  However, his symptoms of decreased exercise tolerance had completely resolved after rhythm control of A-fib with Multaq .  He has never really had true chest pain symptoms.  A-fib was well-controlled until recent spike in his A-fib RVR episodes.  He was on metoprolol  before, however stopped due to fatigue symptoms.  However, has been tolerating metoprolol  fairly well since his resumption.  Recent stress test showed possible infarct in RCA territory, without any ischemia.  He had fairly good exercise capacity on the treadmill.  There were no vitals filed for this visit.     ROS:  Review of Systems  Cardiovascular:  Positive for palpitations. Negative for chest pain, dyspnea on exertion, leg swelling and syncope.     Studies Reviewed: SABRA        EKG  06/27/2024: Normal sinus rhythm Normal ECG When compared with ECG of 25-Sep-2023 13:23, No significant change was found      Echocardiogram 05/2024:  1. Left ventricular ejection fraction, by estimation, is 60 to 65%. Left  ventricular ejection fraction by 3D volume is 68 %. The left ventricle has  normal function. The left ventricle has no regional wall motion  abnormalities. Left ventricular diastolic   parameters were normal. The average left ventricular global longitudinal  strain is -23.4 %. The global longitudinal strain is normal.   2. Right ventricular systolic function is normal. The right ventricular  size is normal. Tricuspid regurgitation signal is inadequate for assessing  PA pressure.   3. The mitral valve is normal in structure. No evidence of mitral valve  regurgitation. No evidence of mitral stenosis.   4. The aortic valve is tricuspid. Aortic valve regurgitation is not  visualized. Aortic valve sclerosis is present, with no evidence of aortic  valve stenosis.   5. The inferior vena cava is normal in size with greater than 50%  respiratory variability, suggesting right atrial pressure of 3 mmHg.    Stress test 05/2024:   Findings are consistent with no ischemia. The study is low risk.   Exercise capacity was normal. Patient exercised for 6 min. Maximum HR of 137 bpm. MPHR 91.0%. Peak METS 7.0.   Up sloping ST depression in the inferolateral leads (II, III, aVF, V5 and V6) was noted. Arrhythmias during stress: frequent PVCs.   LV perfusion is abnormal. Defect 1: There is  a medium defect with mild reduction in uptake present in the apical to basal inferior location(s) that is fixed. There is normal wall motion in the defect area. Consistent with infarction.   Left ventricular function is normal. Nuclear stress EF: 70%. The left ventricular ejection fraction is hyperdynamic (>65%). End diastolic cavity size is normal. End systolic cavity size is normal.   CT images were  obtained for attenuation correction and were examined for the presence of coronary calcium  when appropriate.   Coronary calcium  was present on the attenuation correction CT images. Severe coronary calcifications were present. Coronary calcifications were present in the left anterior descending artery distribution(s).   Prior study available for comparison from 04/11/2022. No changes compared to prior study.   Area of inferior infarct noted but no significant ischemia. Frequent PVCs during stress portion of testing.  Zio patch monitor 11 days 04/05/2024 - 04/16/2024: Dominant rhythm: Sinus. HR 56-123 bpm. Avg HR 81 bpm in sinus rhythm. Atrial Fibrillation/Flutter occurred (2% burden), ranging from 96-234 bpm (avg of 161 bpm), the longest lasting 1 hour 26 mins with an avg rate of 158 bpm. Ventricular Tachycardia and Atrial Fibrillation/Flutter were detected within +/- 45 seconds of symptomatic patient event(s). 16 episodes of VT or Afib w/aberrancy, fastest and longest at 240 bpm for 6 beats. <1% isolated VE, couplet/triplets. 1 episodes of atrial tachycardia fastest at 135 bpm for 14 beats. <1% isolated SVE, couplet/triplets. No high grade AV block, sinus pause >3sec noted. 13 patient triggered events, correlated with Afib/flutter, SVE, VE.  Echocardiogram 12/2021: Normal LV systolic function with visual EF 60-65%. Left ventricle cavity is normal in size. Normal left ventricular wall thickness. Normal global wall motion. Normal diastolic filling pattern, normal LAP. Mild pulmonic regurgitation. Compared to study 12/31/2019 no significant change.  Coronary angiogram 03/2022: LM: Normal LAD: Tortious vessel          Ostial 20% stenosis with mild calcification Lcx: Tortuous vessel. No significant disease RCA: Large, dominant, tortuous vessel          80% proximal stenosis with adjacent aneurysmal areas           60% distal stenosis   Normal LVEDP   Single vessel obstructive  disease Symptoms of decreased exercise tolerance could be due to combination of CAD as well as PAF Recommend aggressive medical therapy for both Added Imdur  30 mg and Multaq  400 mg bid  If no symptom improvement, recommend PCI to prox-mid RCA, followed by triple therapy for 1 month (ASA, plavix, eliquis ), then Aspirin /plavix + eliquis    Labs 06/2023: Chol 114, TG 106, HDL 44, LDL 50 HbA1C 6.2% TSH 1.2 Cr 1.3     Risk Assessment/Calculations:    CHA2DS2-VASc Score = 33  This indicates a 3.23.2% annual risk of stroke. The patient's score is based upon: CHF History: 0 HTN History: 1 Diabetes History: 0 Stroke History: 0 Vascular Disease History: 1 Age Score: 1 Gender Score: 0    Physical Exam:   Physical Exam Vitals and nursing note reviewed.  Constitutional:      General: He is not in acute distress. Neck:     Vascular: No JVD.  Cardiovascular:     Rate and Rhythm: Normal rate and regular rhythm.     Heart sounds: Normal heart sounds. No murmur heard. Pulmonary:     Effort: Pulmonary effort is normal.     Breath sounds: Normal breath sounds. No wheezing or rales.  Musculoskeletal:     Right lower leg: No edema.  Left lower leg: No edema.      VISIT DIAGNOSES:   ICD-10-CM   1. Coronary artery disease involving native coronary artery of native heart without angina pectoris  I25.10 EKG 12-Lead    2. Paroxysmal atrial fibrillation (HCC)  I48.0 Ambulatory referral to Cardiac Electrophysiology    3. Cigarette nicotine  dependence without complication  F17.210          ASSESSMENT AND PLAN: .    Richard Lynch is a 73 y.o. male with  hypertension, hyperlipidemia, CAD, PAD,  paroxysmal A-fib, OSA on CPAP, CIDP, polymyalgia rheumatica   PAF: Historically, his A-fib was well-controlled, but has had recent episodes of A-fib with RVR with 2% A-fib/flutter burden on recent ZIO monitor, as well as few episodes of wide-complex tachycardia-either ventricular  tachycardia or A-fib with aberrant conduction. It is noteworthy that his A-fib was very well-controlled for the past 2 years on Multaq  200 mg twice daily.   He does have known mid RCA stenosis. However, given complete resolution of decreased exercise tolerance symptoms after maintaining sinus rhythm with Multaq , we had not pursued coronary revascularization.  With recent increase in A-fib, we performed further ischemia testing that showed infarct in distal RCA territory, without any ischemia, with 7 METS reached on exercise nuclear stress test without any significant symptoms. Echocardiogram showed normal EF, atria with normal size.  In addition to increasing metoprolol  succinate dose to 50 mg daily, I will refer him to EP for further discussion regarding rhythm control therapy for A-fib, including ablation.  Continue Eliquis  5 mg twice daily  Coronary artery disease: Single-vessel disease with severe mid RCA stenosis with adjacent ectasia. RCA territory infarct without ischemia, good exercise capacity on treadmill (stress test 05/2024). Continue medical management, I do not think he needs coronary revascularization. Given ongoing use of Eliquis  and no anginal symptoms, I will stop aspirin . Continue statin.  Lipids historically well-controlled.  Nicotine  dependence: Tobacco cessation counseling:  - Currently smoking 1/2 packs/day   - Patient was informed of the dangers of tobacco abuse including stroke, cancer, and MI, as well as benefits of tobacco cessation. - Patient is willing to quit at this time. - Approximately 5 mins were spent counseling patient cessation techniques. We discussed various methods to help quit smoking, including deciding on a date to quit, joining a support group, pharmacological agents. Patient would like to use nicotine  patch.  Patient is hoping to use nicotine  patch in conjunction with nicotine  gum.  - I will reassess his progress at the next follow-up visit       F/u in 3 months  Signed, Newman JINNY Lawrence, MD

## 2024-06-27 NOTE — Patient Instructions (Signed)
 Medication Instructions:  STOP Aspirin    INCREASE Metoprolol  Succinate to 50 mg   Refilled Nicotine  Patch  *If you need a refill on your cardiac medications before your next appointment, please call your pharmacy*  Lab Work: NONE If you have labs (blood work) drawn today and your tests are completely normal, you will receive your results only by: MyChart Message (if you have MyChart) OR A paper copy in the mail If you have any lab test that is abnormal or we need to change your treatment, we will call you to review the results.  Testing/Procedures: Referral to Electrophysiology   Follow-Up: At [facility], you and your health needs are our priority.  As part of our continuing mission to provide you with exceptional heart care, our providers are all part of one team.  This team includes your primary Cardiologist (physician) and Advanced Practice Providers or APPs (Physician Assistants and Nurse Practitioners) who all work together to provide you with the care you need, when you need it.  Your next appointment:   3 Months  Provider:   Newman JINNY Lawrence, MD    We recommend signing up for the patient portal called MyChart.  Sign up information is provided on this After Visit Summary.  MyChart is used to connect with patients for Virtual Visits (Telemedicine).  Patients are able to view lab/test results, encounter notes, upcoming appointments, etc.  Non-urgent messages can be sent to your provider as well.   To learn more about what you can do with MyChart, go to [email or web address].

## 2024-07-03 ENCOUNTER — Ambulatory Visit: Payer: Self-pay

## 2024-07-03 ENCOUNTER — Ambulatory Visit (INDEPENDENT_AMBULATORY_CARE_PROVIDER_SITE_OTHER): Admitting: Psychology

## 2024-07-03 DIAGNOSIS — R4189 Other symptoms and signs involving cognitive functions and awareness: Secondary | ICD-10-CM

## 2024-07-03 DIAGNOSIS — R419 Unspecified symptoms and signs involving cognitive functions and awareness: Secondary | ICD-10-CM

## 2024-07-03 NOTE — Progress Notes (Signed)
   Psychometrician Note   Cognitive testing was administered to Richard Lynch by Lonell Jude, B.S. (psychometrist) under the supervision of Dr. Renda Beckwith, Psy.D., licensed psychologist on 07/03/2024. Richard Lynch did not appear overtly distressed by the testing session per behavioral observation or responses across self-report questionnaires. Rest breaks were offered.    The battery of tests administered was selected by Dr. Renda Beckwith, Psy.D. with consideration to Richard Lynch current level of functioning, the nature of his symptoms, emotional and behavioral responses during interview, level of literacy, observed level of motivation/effort, and the nature of the referral question. This battery was communicated to the psychometrist. Communication between Dr. Renda Beckwith, Psy.D. and the psychometrist was ongoing throughout the evaluation and Dr. Renda Beckwith, Psy.D. was immediately accessible at all times. Dr. Renda Beckwith, Psy.D. provided supervision to the psychometrist on the date of this service to the extent necessary to assure the quality of all services provided.    Richard Lynch will return within approximately 1-2 weeks for an interactive feedback session with Dr. Beckwith at which time his test performances, clinical impressions, and treatment recommendations will be reviewed in detail. Richard Lynch understands he can contact our office should he require our assistance before this time.  A total of 125 minutes of billable time were spent face-to-face with Richard Lynch by the psychometrist. This includes both test administration and scoring time. Billing for these services is reflected in the clinical report generated by Dr. Renda Beckwith, Psy.D.  This note reflects time spent with the psychometrician and does not include test scores or any clinical interpretations made by Dr. Beckwith. The full report will follow in a separate note.

## 2024-07-04 NOTE — Progress Notes (Signed)
 NEUROPSYCHOLOGICAL EVALUATION [place]  [place] Department of Neurology  Date of Evaluation: 07/03/2024  REASON FOR REFERRAL   Richard Lynch is a 73 year old, left-handed, White male with 16 years of formal education. He was referred for neuropsychological evaluation by his neurologist, Tonita Blanch, D.O., to assess current neurocognitive functioning, document potential cognitive deficits, and assist with treatment planning. This is his first neuropsychological evaluation.  SUMMARY OF RESULTS   Premorbid cognitive abilities are estimated to be in the average range based on word reading and sociodemographic factors. Consistent with this baseline estimate, performance today was within age-related expectations across all cognitive domains, including attention/working memory, processing speed, executive functioning, language, visuospatial abilities, and learning/memory.   The only exception was low performance on a single measure of attention/working memory. However, given that all other attention/working memory scores and cognitive domains were within normal limits, this isolated finding is unlikely to be of significant concern.  On self-report questionnaires, he endorsed mild symptoms of anxiety and elevated symptoms of excessive daytime sleepiness. He did not report significant symptoms of depression.  DIAGNOSTIC IMPRESSION   Results of the current evaluation indicated normal cognitive functioning. He does not show evidence of a neurocognitive disorder at this time. His overall profile reflects well-preserved cognitive and functional abilities. Subjective cognitive concerns may be related to normal aging, sleep disturbance, and mild mood symptoms. He also has multiple chronic vascular risk factors that may be contributory; however, the extent of possible cerebrovascular pathology remains uncertain in the absence of updated neuroimaging. To the degree that modifiable  factors can be ameliorated, he may find that his subjective cognitive concerns improve.   Results further serve as a baseline for future comparison, should it ever become necessary to re-evaluate the patient.  ICD-10 Codes: R41.9 Cognitive complaints with normal neuropsychological exam  RECOMMENDATIONS   In consultation with your doctor, schedule cognitive reevaluation on an as-needed basis to assess for cognitive decline and update treatment recommendations. Reevaluation should occur during a period of medical and affective stability.  Patient is encouraged to reduce alcohol consumption given the potential implications to your overall health, including reduced balance, cognitive impairment, and vascular risk factors.  Patient is also encouraged to prioritize smoking cessation given the implications to overall health. The following websites provide a range of supportive resources that may assist in the quitting process.  [employer]za  Patient is scheduled for an updated sleep study to evaluate the recent changes in his sleep quality, as he feels unrested despite using his CPAP machine. He may also benefit from the implementation of sleep hygiene techniques, including:  Go to bed and get up at the same time each day to help your body establish a regular rhythm. Establish and maintain a bedtime routine. Certain activities such as stretching, meditating, listening to soft music, or reading ~15 minutes before bedtime can be a great way to regularly get your brain and body ready for sleep. Avoid taking naps during the day. Avoid alcohol and caffeine for 5 or 6 hours before going to bed. Get regular exercise, but not in the hours before bedtime. Use comfortable bedding and maintain a cool temperature in your bedroom. Block out light and distracting noise. Avoid watching television or using your phone/computer in  bed. Avoid staying in bed if you have difficulty falling asleep. If you have not been able to get to sleep after about 20 minutes or more, get up and do something calming or boring until you feel sleepy, then return  to bed and try again.  Prioritize physical health through diet, exercise, and sleep. Regular physical activity supports cardiovascular health, improves mood, and helps preserve mobility and independence. Aim for at least 150 minutes of moderate aerobic exercise per week (e.g., brisk walking, swimming, gardening). A brain-healthy diet such as the Mediterranean or MIND diet is rich in fruits, vegetables, whole grains, healthy fats, and lean proteins, and has been associated with reduced risk of cognitive decline. Additionally, getting adequate, quality sleep and managing chronic conditions with the help of healthcare providers are essential components of healthy aging.  Continue to stay socially and mentally engaged. Maintaining strong social connections and regularly stimulating your brain can help protect against cognitive decline. This includes staying connected with friends and family, volunteering, or participating in community groups. Mentally engaging activities--such as reading, doing puzzles, playing strategy games, or learning a new language or musical instrument--promote brain plasticity. If you are interested in activities to support cognitive engagement, this site offers a variety of apps and games organized by difficulty level:  https://www.barrowneuro.org/get-to-know-barrow/centers-programs/neurorehabilitation-center/neuro-rehab-apps-and-games/  Consider implementing compensatory strategies to maximize independence and maintain daily functioning. Examples include:  Adhere to routine. Compensatory strategies work best when they are used consistently. Use a planner, calendar, or white board that has the schedule and important events for the day clearly listed to reference and cross  off when tasks are complete.  Ask for written information, especially if it is new or unfamiliar (e.g., information provided at a doctor's appointment).  Create an organized environment. Keep items that can be easily misplaced in a sensible location and get into the habit of always returning the items to those places. Pay attention and reduce distractions. Make a point of focusing attention on information you want to remember. One-on-one interaction is more likely to facilitate attention and minimize distraction. Make eye contact and repeat the information out loud after you hear it. Reduce interruptions or distractions especially when attempting to learn new information.  Create associations. When learning something new, think about and understand the information. Explain it in your own words or try to associate it with something you already know. Take notes to help remember important details. Evaluate goals and plan accordingly. When confronted by many different tasks, begin by making a list that prioritizes each task and estimates the time it will take to complete. Break down complicated tasks into smaller, more manageable steps. Focus on one task at a time and complete each task before starting another. Avoid multitasking.  DISPOSITION   Patient will follow up with the referring provider, Dr. Tobie. No follow-up neuropsychological testing was scheduled at this time. Please feel free to refer the patient for repeated evaluation if he shows a significant change in neurocognitive status. He will be provided verbal feedback in approximately one week regarding the findings and impression during this visit.  The remainder of the report includes the details of the patient's background and a table of results from the current evaluation, which support the summary and recommendations described above.  BACKGROUND   History of Presenting Illness: The following information was obtained from a review of  medical records and an interview with the patient. Briefly, the patient has been followed by Dr. Tobie at [facility] for several years for the management of polyneuropathy. During his most recent neurology visit on 05/06/2024, he expressed concerns about dementia due to his mother's history of Alzheimer's dementia. He reported increased difficulty with learning new information and concentrating. As a part owner of several businesses,  he is worried about his declining ability to remember details and complete tasks as efficiently as before. MoCA=28/30. He was referred for neuropsychological evaluation accordingly.  Cognitive Functioning: During today's appointment, the patient reported cognitive changes beginning around age 89, with noticeable worsening over the past 12 months alongside declines in physical function. He reported slower information processing as his main concern, which he also added seems to affect his concentration because he often loses focus while trying to keep up with what he is processing. He further noted short-term memory issues, such as misplacing items and difficulty with new learning, as well as reduced efficiency in executive functions like planning, organizing, and problem-solving. He denied any problems with navigation.  Physical Functioning: Patient reported difficulties with both sleep initiation and maintenance. Although he uses his CPAP machine consistently every night, he feels it is no longer effective, as he no longer wakes up rested like he did when he first started using it. An upcoming sleep study is scheduled to evaluate this issue. Appetite is stable, with no changes in taste or smell. He described his vision as fair and noted slight hearing loss but does not use hearing aids. He also reported poor balance without recent falls and described a tremor in his left thumb.  Emotional Functioning: Patient described recent feelings of being a little disgusted. When  asked to clarify, he explained that he is not depressed but feels frustrated mainly due to his physical limitations. He denied any suicidal ideation.  Neuroimaging: MRI of the brain (05/16/2016) documented mild cerebral volume loss. No updated neuroimaging has been completed.  Other Relevant Medical History: Remarkable for hypertension, paroxysmal atrial fibrillation, coronary artery disease, hyperlipidemia restless leg syndrome, obstructive sleep apnea, bullous emphysema, and gastroesophageal reflux disease. Please refer to the medical record for a more comprehensive problem list. No history of stroke, CNS infection, head injury, or seizure was reported.  Current Medications: Per record, acetaminophen , allopurinol, amphetamine-dextroamphetamine, apixaban , ascorbic acid , candesartan, coenzyme Q10, diltiazem , dronedarone , folic acid , gabapentin , isosorbide  mononitrate, methotrexate, metoprolol , multivitamin with minerals, omega-3 fatty acids, prednisone , rosuvastatin, super B complex, Symbicort, turmeric, valacyclovir , vitamin D3, and zinc.   Functional Status: Patient independently performs all basic and instrumental (e.g., driving, finances, medications) activities of daily living without difficulty.   Family Neurological History: Remarkable for Alzheimer's dementia in the patient's mother around age 68/90.  Psychiatric History: History of depression, anxiety, prior mental health treatment, suicidal ideation, hallucinations, and psychiatric hospitalizations was not reported.  Substance Use History: Patient reported smoking approximately one pack of cigarettes per day. He socially consumes two to three alcoholic drinks nightly. He denied current use of marijuana or other illicit substances.  Social and Developmental History: Patient was born in [place], [place]. History of perinatal complications and developmental delays was not reported. He resides with his significant other of eight years. He has  three children.  Educational and Occupational History: No history of childhood learning disability, special education services, or grade retention was reported. 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 He did not enjoy studying but managed to maintain B/C grades. 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 He is currently a Scientist, product/process development of nine companies (i.e., six in hospitality, one in Holiday representative, one in Armed forces operational officer, and one in virtual entertainment).  BEHAVIORAL OBSERVATIONS   Patient arrived on time and was  unaccompanied. He ambulated independently and without gait disturbance. He was alert and fully oriented. He was appropriately groomed and dressed for the setting. He was observed to have a contracture in his left hand, accompanied by occasional subtle tremors of his left thumb. Vision (with glasses) and hearing were adequate for testing purposes. Speech was of normal rate, prosody, and volume. No conversational word-finding difficulties, paraphasic errors, or dysarthria were observed. Comprehension was conversationally intact. Thought processes were linear, logical, and coherent. Thought content was organized and devoid of delusions. Insight appeared appropriate. Affect was even and congruent with euthymic mood. He was cooperative and gave adequate effort during testing, including on standalone and embedded measures of performance validity. Results are thought to accurately reflect his cognitive functioning at this time.  NEUROPSYCHOLOGICAL TESTING RESULTS   Tests Administered: Animal Naming Test; Brief Visuospatial Memory Test-Revised (BVMT-R) - Form 1; Controlled Oral Word Association Test (COWAT): FAS; Epworth Sleepiness Scale (ESS); Geriatric Anxiety Scale-10 Item (GAS-10); Geriatric  Depression Scale Short Form (GDS-SF); Hopkins Verbal Learning Test Revised (HVLT-R) - From 1; Judgment of Line Orientation (JLO) - Form H; Neuropsychological Assessment Battery (NAB) - Subtest(s): Naming Form 1; Standalone performance validity test (PVT); Test of Premorbid Functioning (TOPF); Trail Making Test (TMT); Wechsler Adult Intelligence Scale Fifth Edition (WAIS-5) - Subtest(s): Similarities, Clinical cytogeneticist, Matrix Reasoning, Digit Sequencing, Coding, Running Digits, Symbol Search, Symbol Span, Naming Speed Quantity; Wechsler Memory Scale Fourth Edition (WMS-IV) - Subtest(s): Logical Memory (LM); and [place]  Card Sorting Test 64 Card Version (WCST-64).  Test results are provided in the table below. Whenever possible, the patient's scores were compared against age-, sex-, and education-corrected normative samples. Interpretive descriptions are based on the AACN consensus conference statement on uniform labeling (Guilmette et al., 2020).  PREMORBID FUNCTIONING RAW  RANGE  TOPF 42 StdS=100 Average  ATTENTION & WORKING MEMORY RAW  RANGE  WAIS-5 Digit Sequencing -- ss=11 Average  WAIS-5 Running Digits -- ss=2 Exceptionally Low  WAIS-5 Symbol Span -- ss=7 Low Average  PROCESSING SPEED RAW  RANGE  Trails A 32''0e T=52 Average  WAIS-5 Coding  -- ss=8 Average  WAIS-5 Symbol Search -- ss=7 Low Average  WAIS-5 Naming Speed Quantity -- ss=13 High Average  EXECUTIVE FUNCTION RAW  RANGE  Trails B 69''1e T=55 Average  WAIS-5 Matrix Reasoning -- ss=9 Average  WAIS-5 Similarities -- ss=10 Average  COWAT Letter Fluency 15+6+9 T=41 Low Average  WCST-64 Total Errors 19 T=51 Average  WCST-64 Perseverative Errors 13 T=46 Average  WCST-64 Nonperseverative Errors 6 T=56 Average  WCST-64 Categories Completed 3 >16%ile WNL  WCSR-64 FMS 0 -- --  LANGUAGE RAW  RANGE  COWAT Letter Fluency 15+6+9 T=41 Low Average  Animal Naming Test 16 T=43 Average  NAB Naming Test 30/31 T=56 WNL  VISUOSPATIAL RAW  RANGE   WAIS-5 Block Design -- ss=10 Average  JLO C/S=28/30 72%ile Average  BVMT-R Copy Trial 12/12 -- WNL  VERBAL LEARNING & MEMORY RAW  RANGE  HVLT Learning Trials (4+7+6)/36 T=37 Low Average  HVLT Delayed Recall 6/12 T=41 Low Average  HVLT Recognition Hits 10 -- --  HVLT Recognition False Positives 0 -- --  HVLT Discrimination Index 10 T=47 Average  WMS-IV LM-I  (11+13+9)/53 ss=11 Average  WMS-IV LM-II  (10+9)/39 ss=11 Average  WMS-IV LM Recognition  (7+13)/23 >75%ile High Average to Exceptionally High  VISUAL LEARNING & MEMORY RAW  RANGE  BVMT-R Total Recall (1+6+8)/36 T=40 Low Average  BVMT-R Delayed Recall 7/12 T=46 Average  BVMT-R Percent Retained 87.5 >16%ile WNL  BVMT-R  Recognition Hits 6 >16%ile WNL  BVMT-R Recognition False Alarms 0 >16%ile WNL  BVMT-R Recognition Discrimination Index 6 >16%ile WNL  QUESTIONNAIRES RAW  RANGE  GDS-SF 4 -- Minimal  GAS-10 7 -- Mild  ESS 13 -- Elevated  *Note: ss = scaled score; StdS = standard score; T = t-score; C/S = corrected raw score; WNL = within normal limits; BNL= below normal limits; D/C = discontinued. Scores from skewed distributions are typically interpreted as WNL (>=16th %ile) or BNL (<16th %ile).   INFORMED CONSENT   Patient was provided with a verbal description of the nature and purpose of the neuropsychological evaluation. Also reviewed were the foreseeable risks and/or discomforts and benefits of the procedure, limits of confidentiality, and mandatory reporting requirements of this provider. Patient was given the opportunity to have their questions answered. Oral consent to participate was provided by the patient.   This report was prepared as part of a clinical evaluation and is not intended for forensic use.  SERVICE   This evaluation was conducted by Renda Beckwith, Psy.D. In addition to time spent directly with the patient, total professional time (120 minutes) includes record review, integration of relevant medical history,  test selection, interpretation of findings, and report preparation. A technician, Lonell Jude, B.S., provided testing and scoring assistance (125 minutes).  Psychiatric Diagnostic Evaluation Services (Professional): 09208 x 1 Neuropsychological Testing Evaluation Services (Professional): 03867 x 1 Neuropsychological Testing Evaluation Services (Professional): 03866 x 1 Neuropsychological Test Administration and Scoring Radiographer, therapeutic): [phone] x 1 Neuropsychological Test Administration and Scoring (Technician): [phone] x 3  This report was generated using voice recognition software. While this document has been carefully reviewed, transcription errors may be present. I apologize in advance for any inconvenience. Please contact me if further clarification is needed.            Renda Beckwith, Psy.D.             Neuropsychologist

## 2024-07-10 DIAGNOSIS — G4761 Periodic limb movement disorder: Secondary | ICD-10-CM | POA: Diagnosis not present

## 2024-07-10 DIAGNOSIS — G4733 Obstructive sleep apnea (adult) (pediatric): Secondary | ICD-10-CM | POA: Diagnosis not present

## 2024-07-15 DIAGNOSIS — R400 Somnolence: Secondary | ICD-10-CM | POA: Diagnosis not present

## 2024-07-15 DIAGNOSIS — G4733 Obstructive sleep apnea (adult) (pediatric): Secondary | ICD-10-CM | POA: Diagnosis not present

## 2024-07-15 NOTE — Progress Notes (Signed)
 Electrophysiology Office Note:    Date:  07/16/2024   ID:  Richard Lynch, DOB Oct 15, 1951, MRN [id number]  PCP:  Dayna Motto, DO    [facility] Providers Cardiologist:  Newman JINNY Lawrence, MD     Referring MD: Lawrence Newman JINNY, MD   History of Present Illness:    Richard Lynch is a 73 y.o. male with a medical history significant for paroxysmal atrial fibrillation, obstructive sleep apnea on CPAP, hypertension, hyperlipidemia, PMR and chronic inflammatory demyelinating polyneuropathy referred for management of atrial fibrillation.     He experienced decreased exercise tolerance dating back to at least 2023 the evaluation included coronary angiogram that showed proximal RCA disease, but he was also noticed to have atrial fibrillation and was managed with Multaq .  He wore a ZIO monitor for 11 days that showed 2% burden of atrial fibrillation or flutter as well as episodes of wide-complex tachycardia.    Discussed the use of AI scribe software for clinical note transcription with the patient, who gave verbal consent to proceed.  History of Present Illness Richard Lynch is a 73 year old male with paroxysmal atrial fibrillation who presents for management of atrial fibrillation. He was referred by Dr. Lawrence for management of atrial fibrillation.  He experiences symptomatic paroxysmal atrial fibrillation with fatigue and occasional chest discomfort. Episodes occur approximately once a week despite Multaq  therapy.  ZIO monitor showed a 2% burden of atrial fibrillation or flutter.  When he first experienced the symptoms in 2023, he underwent a coronary angiogram revealing RCA stenosis. He was started on Multaq  which controlled his symptoms for some time.  His symptoms have began to recur within the past few months           Today, he reports that he is at baseline.  No palpitations today.  EKGs/Labs/Other Studies Reviewed Today:     Echocardiogram:  TTE  June 17, 2024 LVEF 60 to 65%.  No regional wall motion abnormalities.   Monitors:  11 day monitor June 2025-- my interpretation Sinus rhythm 56 to 123 bpm, average 81 bpm 2% burden of atrial fibrillation, rate is 96-234 beats minute, average 161 bpm.  There was a single episode of long RP tachycardia.  Stress testing:  SPECT stress test August 2025 No evidence of ischemia, there is a medium, mild apical to basal inferior fixed defect.  Coronary angiogram:  Coronary angiogram April 11, 2022 80% proximal stenosis of RCA with adjacent aneurysmal areas  EKG:   EKG Interpretation Date/Time:  Wednesday July 16 2024 10:20:22 EDT Ventricular Rate:  65 PR Interval:  146 QRS Duration:  82 QT Interval:  402 QTC Calculation: 418 R Axis:   59  Text Interpretation: Normal sinus rhythm Normal ECG When compared with ECG of 27-Jun-2024 09:17, No significant change was found Confirmed by Nancey Scotts [phone]) on 07/16/2024 10:40:46 AM     Physical Exam:    VS:  BP (!) 142/73 (BP Location: Right Arm, Patient Position: Sitting, Cuff Size: Large)   Pulse 65   Ht 5' 10 (1.778 m)   Wt 192 lb (87.1 kg)   SpO2 97%   BMI 27.55 kg/m     Wt Readings from Last 3 Encounters:  07/16/24 192 lb (87.1 kg)  06/27/24 194 lb 12.8 oz (88.4 kg)  05/13/24 195 lb (88.5 kg)     GEN: Well nourished, well developed in no acute distress CARDIAC: RRR, no murmurs, rubs, gallops RESPIRATORY:  Normal work of breathing MUSCULOSKELETAL: no edema  ASSESSMENT & PLAN:     Atrial fibrillation Rates poorly controlled on ZIO monitor June 2025 Symptomatic with fatigue, chest discomfort Failed Multaq  We discussed management options.  I recommended ablation.  He would like to discuss with family and give us  a callback  We discussed the indication, rationale, logistics, anticipated benefits, and potential risks of the ablation procedure including but not limited to -- bleed at the groin access site,  chest pain, damage to nearby organs such as the diaphragm, lungs, or esophagus, need for a drainage tube, or prolonged hospitalization. I explained that the risk for stroke, heart attack, need for open chest surgery, or even death is very low but not zero. he  expressed understanding and wishes to proceed.   Secondary hypercoagulable state Continue apixaban  5 mg p.o. twice daily  Coronary disease Possible infarct in RCA territory No symptoms of ischemia today Continue follow-up with Dr. [place]      Signed, Eulas FORBES Furbish, MD  07/16/2024 10:55 AM     [facility]

## 2024-07-16 ENCOUNTER — Encounter: Payer: Self-pay | Admitting: Cardiovascular Disease

## 2024-07-16 ENCOUNTER — Ambulatory Visit: Attending: Cardiology | Admitting: Cardiovascular Disease

## 2024-07-16 VITALS — BP 142/73 | HR 65 | Ht 70.0 in | Wt 192.0 lb

## 2024-07-16 DIAGNOSIS — I480 Paroxysmal atrial fibrillation: Secondary | ICD-10-CM | POA: Diagnosis not present

## 2024-07-16 NOTE — Patient Instructions (Signed)
 Medication Instructions:  Your physician recommends that you continue on your current medications as directed. Please refer to the Current Medication list given to you today.  *If you need a refill on your cardiac medications before your next appointment, please call your pharmacy*  Lab Work: None ordered.]  If you have labs (blood work) drawn today and your tests are completely normal, you will receive your results only by: MyChart Message (if you have MyChart) OR A paper copy in the mail If you have any lab test that is abnormal or we need to change your treatment, we will call you to review the results.  Testing/Procedures: None ordered.   Follow-Up: At [facility], you and your health needs are our priority.  As part of our continuing mission to provide you with exceptional heart care, our providers are all part of one team.  This team includes your primary Cardiologist (physician) and Advanced Practice Providers or APPs (Physician Assistants and Nurse Practitioners) who all work together to provide you with the care you need, when you need it.  Your next appointment:   Please let us  know if and when you decide to move forward with the Afib ablation.

## 2024-07-22 ENCOUNTER — Ambulatory Visit (INDEPENDENT_AMBULATORY_CARE_PROVIDER_SITE_OTHER): Admitting: Psychology

## 2024-07-22 DIAGNOSIS — R419 Unspecified symptoms and signs involving cognitive functions and awareness: Secondary | ICD-10-CM

## 2024-07-22 NOTE — Progress Notes (Signed)
   NEUROPSYCHOLOGY FEEDBACK SESSION . [facility]  [place] Department of Neurology  Date of Feedback Session: 07/22/2024  REASON FOR REFERRAL   Richard Lynch is a 73 year old, left-handed, White male with 16 years of formal education. He was referred for neuropsychological evaluation by his neurologist, Tonita Blanch, D.O., to assess current neurocognitive functioning, document potential cognitive deficits, and assist with treatment planning. This is his first neuropsychological evaluation.  FEEDBACK   Patient completed a comprehensive neuropsychological evaluation on 07/03/2024. Please refer to that encounter for the full report and recommendations. Briefly, results indicated normal cognitive functioning. His overall profile reflects well-preserved cognitive and functional abilities. Subjective cognitive concerns may be related to normal aging, sleep disturbance, and mild mood symptoms. He also has multiple chronic vascular risk factors that may be contributory; however, the extent of possible cerebrovascular pathology remains uncertain in the absence of updated neuroimaging.   Today, the patient was unaccompanied. He was provided verbal feedback regarding the findings and impression during this visit, and his questions were answered. A copy of the report was provided at the conclusion of the visit.  DISPOSITION   Patient will follow up with the referring provider, Dr. Blanch. No follow-up neuropsychological testing was scheduled at this time. Please feel free to refer the patient for repeated evaluation if he shows a significant change in neurocognitive status.  SERVICE   This feedback session was conducted by Renda Beckwith, Psy.D. [place] unit of 03867 (35 minutes) was billed for Dr. Beckwith' time spent in preparing, conducting, and documenting the current feedback session.  This report was generated using voice recognition software. While this document has been carefully  reviewed, transcription errors may be present. I apologize in advance for any inconvenience. Please contact me if further clarification is needed.

## 2024-07-28 ENCOUNTER — Other Ambulatory Visit: Payer: Medicare Other

## 2024-07-28 ENCOUNTER — Ambulatory Visit (HOSPITAL_BASED_OUTPATIENT_CLINIC_OR_DEPARTMENT_OTHER)
Admission: RE | Admit: 2024-07-28 | Discharge: 2024-07-28 | Disposition: A | Discharge status: Home / Self Care | Source: Ambulatory Visit | Attending: Rheumatology | Admitting: Rheumatology

## 2024-07-28 DIAGNOSIS — M1120 Other chondrocalcinosis, unspecified site: Secondary | ICD-10-CM | POA: Insufficient documentation

## 2024-07-28 DIAGNOSIS — Z13820 Encounter for screening for osteoporosis: Secondary | ICD-10-CM | POA: Insufficient documentation

## 2024-07-28 DIAGNOSIS — Z7952 Long term (current) use of systemic steroids: Secondary | ICD-10-CM | POA: Diagnosis not present

## 2024-07-28 DIAGNOSIS — M8589 Other specified disorders of bone density and structure, multiple sites: Secondary | ICD-10-CM | POA: Diagnosis not present

## 2024-08-04 DIAGNOSIS — D0472 Carcinoma in situ of skin of left lower limb, including hip: Secondary | ICD-10-CM | POA: Diagnosis not present

## 2024-08-04 DIAGNOSIS — L565 Disseminated superficial actinic porokeratosis (DSAP): Secondary | ICD-10-CM | POA: Diagnosis not present

## 2024-08-04 DIAGNOSIS — D485 Neoplasm of uncertain behavior of skin: Secondary | ICD-10-CM | POA: Diagnosis not present

## 2024-08-04 DIAGNOSIS — L570 Actinic keratosis: Secondary | ICD-10-CM | POA: Diagnosis not present

## 2024-08-25 ENCOUNTER — Ambulatory Visit
Admission: RE | Admit: 2024-08-25 | Discharge: 2024-08-25 | Disposition: A | Discharge status: Home / Self Care | Source: Ambulatory Visit | Attending: Acute Care | Admitting: Acute Care

## 2024-08-25 DIAGNOSIS — R911 Solitary pulmonary nodule: Secondary | ICD-10-CM

## 2024-08-25 DIAGNOSIS — Z87891 Personal history of nicotine dependence: Secondary | ICD-10-CM

## 2024-08-25 DIAGNOSIS — J439 Emphysema, unspecified: Secondary | ICD-10-CM | POA: Diagnosis not present

## 2024-08-25 DIAGNOSIS — J479 Bronchiectasis, uncomplicated: Secondary | ICD-10-CM | POA: Diagnosis not present

## 2024-09-01 ENCOUNTER — Other Ambulatory Visit: Payer: Self-pay

## 2024-09-01 DIAGNOSIS — Z87891 Personal history of nicotine dependence: Secondary | ICD-10-CM

## 2024-09-01 DIAGNOSIS — Z122 Encounter for screening for malignant neoplasm of respiratory organs: Secondary | ICD-10-CM

## 2024-09-01 DIAGNOSIS — F17210 Nicotine dependence, cigarettes, uncomplicated: Secondary | ICD-10-CM

## 2024-09-16 DIAGNOSIS — M8589 Other specified disorders of bone density and structure, multiple sites: Secondary | ICD-10-CM | POA: Diagnosis not present

## 2024-09-16 DIAGNOSIS — M25562 Pain in left knee: Secondary | ICD-10-CM | POA: Diagnosis not present

## 2024-09-16 DIAGNOSIS — E663 Overweight: Secondary | ICD-10-CM | POA: Diagnosis not present

## 2024-09-16 DIAGNOSIS — M1120 Other chondrocalcinosis, unspecified site: Secondary | ICD-10-CM | POA: Diagnosis not present

## 2024-09-16 DIAGNOSIS — Z6827 Body mass index (BMI) 27.0-27.9, adult: Secondary | ICD-10-CM | POA: Diagnosis not present

## 2024-09-16 DIAGNOSIS — M109 Gout, unspecified: Secondary | ICD-10-CM | POA: Diagnosis not present

## 2024-09-16 DIAGNOSIS — M353 Polymyalgia rheumatica: Secondary | ICD-10-CM | POA: Diagnosis not present

## 2024-09-24 ENCOUNTER — Encounter: Payer: Self-pay | Admitting: Cardiology

## 2024-09-24 ENCOUNTER — Ambulatory Visit: Attending: Cardiology | Admitting: Cardiology

## 2024-09-24 VITALS — BP 140/76 | HR 70 | Ht 70.0 in | Wt 192.4 lb

## 2024-09-24 DIAGNOSIS — I2510 Atherosclerotic heart disease of native coronary artery without angina pectoris: Secondary | ICD-10-CM | POA: Insufficient documentation

## 2024-09-24 DIAGNOSIS — F17210 Nicotine dependence, cigarettes, uncomplicated: Secondary | ICD-10-CM | POA: Diagnosis not present

## 2024-09-24 DIAGNOSIS — I480 Paroxysmal atrial fibrillation: Secondary | ICD-10-CM | POA: Diagnosis not present

## 2024-09-24 NOTE — Patient Instructions (Signed)

## 2024-09-24 NOTE — Progress Notes (Addendum)
 Cardiology Office Note:  .   Date:  09/24/2024  ID:  Richard Lynch, DOB 1951-07-12, MRN [id number] PCP: Dayna Motto, DO  [facility] Providers Cardiologist:  Newman Lawrence, MD PCP: Dayna Motto, DO  Chief Complaint  Patient presents with   PAF      History of Present Illness: .    Richard Lynch is a 73 y.o. male with hypertension, hyperlipidemia, prediabetes, CAD, PAD, paroxysmal A-fib, OSA on CPAP, CIDP, polymyalgia rheumatica  In mid 2025, patient noted episodes of tachycardia in the 180s lasting for 2 to 4 hours.  He wore Zio monitor for 11 days, that showed 2% A-fib/flutter burden, as well as episodes of wide-complex tachycardia possibly VT versus A-fib with aberrant conduction.  He denies any chest pain decreased exercise tolerance symptoms, but that said he has not been doing much in terms of physical activity.  He plays golf, but rides a cart rather than walking on the golf course.   Historically, patient recently underwent workup due to decreased exercise tolerance that showed A-fib, abnormal stress test, with proximal/mid RCA severe stenosis.  However, his symptoms of decreased exercise tolerance had completely resolved after rhythm control of A-fib with Multaq .  He has never really had true chest pain symptoms.  A-fib was well-controlled until recent spike in his A-fib RVR episodes.  He was on metoprolol  before, however stopped due to fatigue symptoms.  However, has been tolerating metoprolol  fairly well since his resumption.  Recent stress test showed possible infarct in RCA territory, without any ischemia.  He had fairly good exercise capacity on the treadmill.  Patient was seen by electrophysiologist Dr. Nancey for A-fib.  Given poorly controlled A-fib, previously failed Multaq , he offered ablation.  He continues to have symptoms of A-fib once a month, associated symptoms of lightheadedness, lasting for 2-3 hours.  There were no vitals filed for this  visit.     ROS:  Review of Systems  Cardiovascular:  Positive for palpitations. Negative for chest pain, dyspnea on exertion, leg swelling and syncope.  Neurological:  Positive for light-headedness.     Studies Reviewed: SABRA        EKG 06/27/2024: Normal sinus rhythm Normal ECG When compared with ECG of 25-Sep-2023 13:23, No significant change was found   Echocardiogram 05/2024:  1. Left ventricular ejection fraction, by estimation, is 60 to 65%. Left  ventricular ejection fraction by 3D volume is 68 %. The left ventricle has  normal function. The left ventricle has no regional wall motion  abnormalities. Left ventricular diastolic   parameters were normal. The average left ventricular global longitudinal  strain is -23.4 %. The global longitudinal strain is normal.   2. Right ventricular systolic function is normal. The right ventricular  size is normal. Tricuspid regurgitation signal is inadequate for assessing  PA pressure.   3. The mitral valve is normal in structure. No evidence of mitral valve  regurgitation. No evidence of mitral stenosis.   4. The aortic valve is tricuspid. Aortic valve regurgitation is not  visualized. Aortic valve sclerosis is present, with no evidence of aortic  valve stenosis.   5. The inferior vena cava is normal in size with greater than 50%  respiratory variability, suggesting right atrial pressure of 3 mmHg.    Stress test 05/2024:   Findings are consistent with no ischemia. The study is low risk.   Exercise capacity was normal. Patient exercised for 6 min. Maximum HR of 137 bpm. MPHR 91.0%. Peak METS  7.0.   Up sloping ST depression in the inferolateral leads (II, III, aVF, V5 and V6) was noted. Arrhythmias during stress: frequent PVCs.   LV perfusion is abnormal. Defect 1: There is a medium defect with mild reduction in uptake present in the apical to basal inferior location(s) that is fixed. There is normal wall motion in the defect area.  Consistent with infarction.   Left ventricular function is normal. Nuclear stress EF: 70%. The left ventricular ejection fraction is hyperdynamic (>65%). End diastolic cavity size is normal. End systolic cavity size is normal.   CT images were obtained for attenuation correction and were examined for the presence of coronary calcium  when appropriate.   Coronary calcium  was present on the attenuation correction CT images. Severe coronary calcifications were present. Coronary calcifications were present in the left anterior descending artery distribution(s).   Prior study available for comparison from 04/11/2022. No changes compared to prior study.   Area of inferior infarct noted but no significant ischemia. Frequent PVCs during stress portion of testing.  Zio patch monitor 11 days 04/05/2024 - 04/16/2024: Dominant rhythm: Sinus. HR 56-123 bpm. Avg HR 81 bpm in sinus rhythm. Atrial Fibrillation/Flutter occurred (2% burden), ranging from 96-234 bpm (avg of 161 bpm), the longest lasting 1 hour 26 mins with an avg rate of 158 bpm. Ventricular Tachycardia and Atrial Fibrillation/Flutter were detected within +/- 45 seconds of symptomatic patient event(s). 16 episodes of VT or Afib w/aberrancy, fastest and longest at 240 bpm for 6 beats. <1% isolated VE, couplet/triplets. 1 episodes of atrial tachycardia fastest at 135 bpm for 14 beats. <1% isolated SVE, couplet/triplets. No high grade AV block, sinus pause >3sec noted. 13 patient triggered events, correlated with Afib/flutter, SVE, VE.  Echocardiogram 12/2021: Normal LV systolic function with visual EF 60-65%. Left ventricle cavity is normal in size. Normal left ventricular wall thickness. Normal global wall motion. Normal diastolic filling pattern, normal LAP. Mild pulmonic regurgitation. Compared to study 12/31/2019 no significant change.  Coronary angiogram 03/2022: LM: Normal LAD: Tortious vessel          Ostial 20% stenosis with mild  calcification Lcx: Tortuous vessel. No significant disease RCA: Large, dominant, tortuous vessel          80% proximal stenosis with adjacent aneurysmal areas           60% distal stenosis   Normal LVEDP   Single vessel obstructive disease Symptoms of decreased exercise tolerance could be due to combination of CAD as well as PAF Recommend aggressive medical therapy for both Added Imdur  30 mg and Multaq  400 mg bid  If no symptom improvement, recommend PCI to prox-mid RCA, followed by triple therapy for 1 month (ASA, plavix, eliquis ), then Aspirin /plavix + eliquis   Labs 06/2024: Chol 134, TG 169, HDL 60, LDL 46 HbA1C 6.4% Hb 12.6 Cr 0.9 TSH 0.7   Labs 06/2023: Chol 114, TG 106, HDL 44, LDL 50 HbA1C 6.2% TSH 1.2 Cr 1.3     Risk Assessment/Calculations:    CHA2DS2-VASc Score = 33  This indicates a 3.23.2% annual risk of stroke. The patient's score is based upon: CHF History: 0 HTN History: 1 Diabetes History: 0 Stroke History: 0 Vascular Disease History: 1 Age Score: 1 Gender Score: 0    Physical Exam:   Physical Exam Vitals and nursing note reviewed.  Constitutional:      General: He is not in acute distress. Neck:     Vascular: No JVD.  Cardiovascular:     Rate and  Rhythm: Normal rate and regular rhythm.     Heart sounds: Normal heart sounds. No murmur heard. Pulmonary:     Effort: Pulmonary effort is normal.     Breath sounds: Normal breath sounds. No wheezing or rales.  Musculoskeletal:     Right lower leg: No edema.     Left lower leg: No edema.      VISIT DIAGNOSES:   ICD-10-CM   1. Coronary artery disease involving native coronary artery of native heart without angina pectoris  I25.10     2. Paroxysmal atrial fibrillation (HCC)  I48.0     3. Cigarette nicotine  dependence without complication  F17.210           ASSESSMENT AND PLAN: .    Baraka C Fredlund is a 73 y.o. male with  hypertension, hyperlipidemia, prediabetes, CAD, PAD,   paroxysmal A-fib, OSA on CPAP, CIDP, polymyalgia rheumatica   PAF: Uncontrolled on Multaq . Okay to stop Multaq . Continue metoprolol . He has seen Dr. Nancey, and would like to proceed with ablation.  I will let Dr. Marko office know.   Continue Eliquis  5 mg twice daily.  Coronary artery disease: Single-vessel disease with severe mid RCA stenosis with adjacent ectasia. RCA territory infarct without ischemia, good exercise capacity on treadmill (stress test 05/2024). Continue medical management, I do not think he needs coronary revascularization. Given ongoing use of Eliquis  and no anginal symptoms, not on aspirin .   Continue statin.  Lipids historically well-controlled.  Hypertension: Blood pressure seems to be elevated middle of the day.  I encouraged him to take his candesartan at night and see if it improves his blood pressure readings during the day.  If it does not, could consider adding amlodipine.  Prediabetes: A1c 6.4%.  Recommend low carbohydrate diet and regular exercise.  Follow-up with PCP, could consider adding metformin in future.  Nicotine  dependence: Unfortunately, he continues to smoke and is not ready to quit yet.  After further discussion, he is willing to give nicotine  patch or try.     F/u in 6 months  Signed, Newman JINNY Lawrence, MD

## 2024-10-06 DIAGNOSIS — D692 Other nonthrombocytopenic purpura: Secondary | ICD-10-CM | POA: Diagnosis not present

## 2024-10-06 DIAGNOSIS — Z85828 Personal history of other malignant neoplasm of skin: Secondary | ICD-10-CM | POA: Diagnosis not present

## 2024-10-06 DIAGNOSIS — L821 Other seborrheic keratosis: Secondary | ICD-10-CM | POA: Diagnosis not present

## 2024-10-06 DIAGNOSIS — L570 Actinic keratosis: Secondary | ICD-10-CM | POA: Diagnosis not present

## 2024-10-06 DIAGNOSIS — L905 Scar conditions and fibrosis of skin: Secondary | ICD-10-CM | POA: Diagnosis not present

## 2024-10-07 DIAGNOSIS — G4733 Obstructive sleep apnea (adult) (pediatric): Secondary | ICD-10-CM | POA: Diagnosis not present

## 2024-10-07 DIAGNOSIS — I480 Paroxysmal atrial fibrillation: Secondary | ICD-10-CM | POA: Diagnosis not present

## 2024-10-07 DIAGNOSIS — I10 Essential (primary) hypertension: Secondary | ICD-10-CM | POA: Diagnosis not present

## 2024-10-08 NOTE — Progress Notes (Signed)
 New Patient Pulmonology Office Visit   Subjective:  Patient ID: Richard Lynch, male    DOB: 03-04-1951  MRN: [id number]  Referred by: Dayna Motto, DO  CC: No chief complaint on file.   HPI Richard Lynch is a 73 y.o. male with ***  COPD? Smoking LDLung cancer screening   {PULM QUESTIONNAIRES (Optional):33196}  ROS  Allergies: Patient has no known allergies. Current Medications[1] Past Medical History:  Diagnosis Date   Atrial fibrillation (HCC)    Constipation due to opioid therapy 12/22/2015   Emphysema lung (HCC)    Gait abnormality 02/20/2017   GERD (gastroesophageal reflux disease)    High cholesterol    Hypertension    OSA (obstructive sleep apnea)    on CPAP   Primary localized osteoarthritis of right knee 12/08/2015   Septic olecranon bursitis 06/2015   MSSA   Past Surgical History:  Procedure Laterality Date   COLONOSCOPY W/ BIOPSIES AND POLYPECTOMY     ELBOW SURGERY  08/2015   bilateral   EYE SURGERY     'repair of tear to left eye from pliers'   HAND SURGERY Left 08/2018   Carpal Tunnel Surgery- [facility]   HERNIA REPAIR  07/2010   KNEE SURGERY     3 times on both knee's each   LEFT HEART CATH AND CORONARY ANGIOGRAPHY N/A 04/11/2022   Procedure: LEFT HEART CATH AND CORONARY ANGIOGRAPHY;  Surgeon: Elmira Newman PARAS, MD;  Location: [facility] INVASIVE CV LAB;  Service: Cardiovascular;  Laterality: N/A;   MULTIPLE TOOTH EXTRACTIONS     SHOULDER ARTHROSCOPY     TOTAL HIP ARTHROPLASTY  08/2010   TOTAL KNEE ARTHROPLASTY Right 12/20/2015   TOTAL KNEE ARTHROPLASTY Right 12/20/2015   Procedure: TOTAL KNEE ARTHROPLASTY;  Surgeon: Lamar Millman, MD;  Location: [facility] OR;  Service: Orthopedics;  Laterality: Right;   Family History  Problem Relation Age of Onset   Heart disease Mother    Colon cancer Mother    Breast cancer Mother    Emphysema Father    Heart disease Father    Social History   Socioeconomic History   Marital status: Significant Other    Spouse  name: Not on file   Number of children: 3   Years of education: College   Highest education level: Not on file  Occupational History   Occupation: Owner of floor store  Tobacco Use   Smoking status: Every Day    Current packs/day: 1.00    Average packs/day: 1 pack/day for 51.7 years (51.7 ttl pk-yrs)    Types: Cigarettes    Start date: 01/24/1973   Smokeless tobacco: Never   Tobacco comments:    1 pack daily  Vaping Use   Vaping status: Former   Substances: Nicotine   Substance and Sexual Activity   Alcohol use: Yes    Alcohol/week: 4.0 standard drinks of alcohol    Types: 2 Cans of beer, 2 Standard drinks or equivalent per week    Comment: daily   Drug use: No   Sexual activity: Not on file  Other Topics Concern   Not on file  Social History Narrative   Originally from [place] . He has lived in [place]  as well. He moved to [facility] in [place].    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    He has bachelors degree in Business.    Has a dog currently. No bird or mold exposure.  Caffeine ldz:Rnqqzz- daily   Soda- sometimes   Left-handed   Social Drivers of Health   Tobacco Use: High Risk (09/24/2024)   Patient History    Smoking Tobacco Use: Every Day    Smokeless Tobacco Use: Never    Passive Exposure: Not on file  Financial Resource Strain: Not on file  Food Insecurity: Not on file  Transportation Needs: Not on file  Physical Activity: Not on file  Stress: Not on file  Social Connections: Not on file  Intimate Partner Violence: Not on file  Depression (EYV7-0): Not on file  Alcohol Screen: Not on file  Housing: Not on file  Utilities: Not on file  Health Literacy: Not on file       Objective:  There were no vitals taken for this visit. {Pulm Vitals (Optional):32837}  Physical Exam  Diagnostic Review:  {Labs (Optional):32838}  CT chest 08/30/24 LDCT 1. Lung-RADS 2, benign appearance or behavior.  Continue annual screening with low-dose chest CT without contrast in 12 months. 2. Mild cylindrical bronchiectasis. 3. Aortic atherosclerosis (ICD10-I70.0). Coronary artery calcification. 4.  Emphysema (ICD10-J43.9).    Assessment & Plan:   Assessment & Plan    No follow-ups on file.    Marny Patch, MD Pulmonary and Critical Care Medicine [facility]    [1]  Current Outpatient Medications:    Acetaminophen  500 MG capsule, 1 capsule as needed Orally every 6 hrs, Disp: , Rfl:    allopurinol (ZYLOPRIM) 100 MG tablet, Take 200 mg by mouth daily., Disp: , Rfl:    amphetamine-dextroamphetamine (ADDERALL) 20 MG tablet, Take 20-30 mg by mouth daily as needed (sleepiness)., Disp: , Rfl:    apixaban  (ELIQUIS ) 5 MG TABS tablet, Take 1 tablet (5 mg total) by mouth 2 (two) times daily., Disp: 60 tablet, Rfl: 5   Ascorbic Acid  500 MG CAPS, 1 tablet Orally Once a day, Disp: , Rfl:    B Complex-C (SUPER B COMPLEX PO), Take 1 tablet by mouth daily., Disp: , Rfl:    calcium  carbonate (OSCAL) 1500 (600 Ca) MG TABS tablet, Take by mouth daily with breakfast., Disp: , Rfl:    candesartan (ATACAND) 32 MG tablet, Take 32 mg by mouth daily. , Disp: , Rfl:    Cholecalciferol  (VITAMIN D3) 50 MCG (2000 UT) capsule, Take 2,000 Units by mouth daily., Disp: , Rfl:    Coenzyme Q10 (COQ-10 PO), Take 100-300 mg by mouth daily., Disp: , Rfl:    diltiazem  (CARDIZEM ) 30 MG tablet, TAKE 1 TABLET EVERY 4 HOURS AS NEEDED FOR AFIB HEART RATE >100, Disp: 360 tablet, Rfl: 1   dronedarone  (MULTAQ ) 400 MG tablet, Take 1 tablet (400 mg total) by mouth 2 (two) times daily with a meal., Disp: 180 tablet, Rfl: 2   folic acid  (FOLVITE ) 1 MG tablet, Take 1 mg by mouth daily., Disp: , Rfl:    gabapentin  (NEURONTIN ) 300 MG capsule, Take 4 capsules (1,200 mg total) by mouth See admin instructions., Disp: 360 capsule, Rfl: 3   isosorbide  mononitrate (IMDUR ) 30 MG 24 hr tablet, TAKE 1 TABLET BY MOUTH EVERY  DAY, Disp: 90 tablet, Rfl: 3   methotrexate (RHEUMATREX) 2.5 MG tablet, Take 2.5 mg by mouth once a week. Caution:Chemotherapy. Protect from light.  6 TABLETS WEEKLY, Disp: , Rfl:    metoprolol  succinate (TOPROL  XL) 50 MG 24 hr tablet, Take 1 tablet (50 mg total) by mouth at bedtime., Disp: 90 tablet, Rfl: 3   Multiple Vitamin (MULTIVITAMIN WITH MINERALS) TABS tablet,  Take 1 tablet by mouth daily. Centrum silver, Disp: , Rfl:    nicotine  (NICODERM CQ  - DOSED IN MG/24 HOURS) 21 mg/24hr patch, Place 1 patch (21 mg total) onto the skin daily., Disp: 60 patch, Rfl: 1   nicotine  polacrilex (NICORETTE ) 2 MG gum, Take 1 each (2 mg total) by mouth as needed for smoking cessation., Disp: 100 tablet, Rfl: 0   Omega-3 Fatty Acids (FISH OIL) 1200 MG CAPS, 1 capsule Orally Once a day, Disp: , Rfl:    predniSONE  (DELTASONE ) 1 MG tablet, Take 1 mg by mouth daily with breakfast., Disp: , Rfl:    rosuvastatin (CRESTOR) 20 MG tablet, Take 20 mg by mouth daily., Disp: , Rfl:    SYMBICORT 160-4.5 MCG/ACT inhaler, Inhale 2 puffs into the lungs daily as needed (lungs)., Disp: , Rfl:    Turmeric (QC TUMERIC COMPLEX PO), Take 1,500 mg by mouth daily., Disp: , Rfl:    valACYclovir  (VALTREX ) 500 MG tablet, Take 500 mg by mouth daily., Disp: , Rfl:    Zinc Sulfate (ZINC 15 PO), Take 50 mg by mouth daily., Disp: , Rfl:

## 2024-10-09 ENCOUNTER — Ambulatory Visit

## 2024-10-09 VITALS — BP 158/77 | HR 74 | Temp 98.2°F | Resp 18 | Ht 70.0 in | Wt 190.6 lb

## 2024-10-09 DIAGNOSIS — J439 Emphysema, unspecified: Secondary | ICD-10-CM

## 2024-10-09 DIAGNOSIS — F17210 Nicotine dependence, cigarettes, uncomplicated: Secondary | ICD-10-CM

## 2024-10-09 MED ORDER — NICOTINE 21 MG/24HR TD PT24: 21.0000 mg | MEDICATED_PATCH | Freq: Every day | TRANSDERMAL | 1 refills | Status: AC

## 2024-10-09 NOTE — Patient Instructions (Signed)
 Dear Mr. Remmers;   I will do a pulmonary function test in 3 months for evaluation of COPD.  You don't have significant symptoms at this time, however you may need some inhalers in the future.  I highly recommend to stop smoking, by decreasing the number of cigarettes a day over time. Use the nicotine  patches for cravings.  Exercise is very important for you, so continue walking.    I will see you in 3 months

## 2024-10-13 ENCOUNTER — Other Ambulatory Visit: Payer: Self-pay | Admitting: Cardiology

## 2024-10-13 DIAGNOSIS — I2510 Atherosclerotic heart disease of native coronary artery without angina pectoris: Secondary | ICD-10-CM

## 2024-10-14 MED ORDER — ISOSORBIDE MONONITRATE ER 30 MG PO TB24: 30.0000 mg | ORAL_TABLET | Freq: Every day | ORAL | 3 refills | Status: AC

## 2024-10-31 ENCOUNTER — Encounter: Payer: Self-pay | Admitting: Cardiology

## 2024-11-11 ENCOUNTER — Telehealth: Payer: Self-pay

## 2024-11-11 DIAGNOSIS — Z01812 Encounter for preprocedural laboratory examination: Secondary | ICD-10-CM

## 2024-11-11 DIAGNOSIS — I480 Paroxysmal atrial fibrillation: Secondary | ICD-10-CM

## 2024-11-11 NOTE — Telephone Encounter (Signed)
 Spoke with pt and scheduled ablation for 12/18/2024 with Dr Nancey.  Pt advised will schedule, instructions will be placed on MyChart.  Pt verbalizes understanding and agrees with current plan.

## 2024-11-11 NOTE — Telephone Encounter (Signed)
Attempted phone call to pt and left voicemail message to contact office at [phone]. 

## 2024-11-11 NOTE — Telephone Encounter (Signed)
 Patient returning call to nurse

## 2024-11-11 NOTE — Addendum Note (Signed)
 Addended by: CASIMIR ALDONA BRAVO on: 11/11/2024 05:38 PM   Modules accepted: Orders

## 2024-11-12 ENCOUNTER — Other Ambulatory Visit: Payer: Self-pay | Admitting: Cardiology

## 2024-11-12 DIAGNOSIS — I480 Paroxysmal atrial fibrillation: Secondary | ICD-10-CM

## 2024-11-12 NOTE — Addendum Note (Signed)
 Addended by: CASIMIR ALDONA BRAVO on: 11/12/2024 03:21 PM   Modules accepted: Orders

## 2024-11-18 ENCOUNTER — Telehealth (HOSPITAL_COMMUNITY): Payer: Self-pay

## 2024-11-18 ENCOUNTER — Telehealth: Payer: Self-pay

## 2024-11-18 ENCOUNTER — Encounter (HOSPITAL_COMMUNITY): Payer: Self-pay

## 2024-11-18 NOTE — Telephone Encounter (Signed)
-----   Message from Nurse Aldona SAUNDERS, RN sent at 11/12/2024  5:30 PM EST ----- Regarding: FW: 2/26 Afib [facility]al[facility] - UPDATED Important: list procedure date as first item in subject line, followed by procedure type (e.g., 07/04/24 AFib ablation)  Precert:  MD: Mealor Type of ablation: A-fib Diagnosis: Afib CPT code: A-fib (06343) Ablation scheduled (date/time): 02/26 11am  Procedure:  Added to calendar? Yes Orders entered? No, >30 days before procedure Letter complete? No, >30 days before procedure Scheduled with cath lab? Yes Any medications to hold? Yes (please list hold instructions): Routine Labs ordered (CBC, BMET, PT/INR if on warfarin): Yes Mapping system: Doesn't matter CARTO/OPAL rep notified? No Cardiac CT needed? Yes, ordered Dye allergy? No Pre-meds ordered and instructions given? N/A Letter method: MyChart H&P:Day of Device: No  Follow-up:  Cassie/Angel, please schedule Routine.  Covering RN - please send this message to [employer], EP scheduler, EP Scheduling pool, EP [employer], and CT scheduler (Brittany Lynch/Stephanie Mogg), if indicated. ----- Message ----- From: Casimir Aldona BRAVO, RN Sent: 11/12/2024   7:46 AM EST To: Stanislav Gervase, CMA; [place] MALVA Sever, RN;# Subject: 2/26 Afib Mealor                               Important: list procedure date as first item in subject line, followed by procedure type (e.g., 07/04/24 AFib ablation)  Precert:  MD: [place] Type of ablation: A-fib Diagnosis: AFib CPT code: A-fib (06343) Ablation scheduled (date/time): 2/26 11am  Procedure:  Added to calendar? Yes Orders entered? No, >30 days before procedure Letter complete? No, >30 days before procedure Scheduled with cath lab? Yes Any medications to hold? Routine Labs ordered (CBC, BMET, PT/INR if on warfarin): Yes Mapping system: Doesn't matter CARTO/OPAL rep notified? No Cardiac CT needed? No response from [facility] as of yet Dye allergy? No Pre-meds  ordered and instructions given? N/a Letter method: MyChart H&P: morning of Device: No  Follow-up:  Cassie/Angel, please schedule Routine.  Covering RN - please send this message to [employer], EP scheduler, EP Scheduling pool, EP [employer], and CT scheduler (Brittany Lynch/Stephanie Mogg), if indicated.

## 2024-11-18 NOTE — Telephone Encounter (Signed)
 Spoke with patient to complete pre-procedure call.     Health status review:  Any new medical conditions, recent signs of acute illness or been started on antibiotics? No. Patient reports concerns with elevated BP and increased episodes of dizziness. Encouraged patient to contact his cardiologist, who manages his hypertension.  Any recent hospitalizations or surgeries? No Any new medications started since pre-op visit? No  Follow all medication instructions prior to procedure or the procedure may be rescheduled:    Continue taking Eliquis  (Apixaban ) twice daily without missing any doses before procedure. Essential chronic medications:  No medication should be continued, unless told otherwise. On the morning of your procedure DO NOT take any medication., including Eliquis  (Apixaban ).  Nothing to eat or drink after midnight prior to your procedure.  Pre-procedure testing scheduled: CT and lab work February 5.  Confirmed patient is scheduled for Atrial Fibrillation Ablation on Thursday, February 26 with Dr. Nancey. Instructed patient to arrive at the Main Entrance A at [facility]: [address] [place], [place] [address] and check in at Admitting at 9:00 AM.  Plan to go home the same day, you will only stay overnight if medically necessary. You MUST have a responsible adult to drive you home and MUST be with you the first 24 hours after you arrive home or your procedure could be cancelled.  Informed a nurse may call a day before the procedure to confirm arrival time and ensure instructions are followed.  Patient verbalized understanding to information provided and is agreeable to proceed with procedure.   Advised to contact RN Navigator at [phone], to inform of any new medications started after call or concerns prior to procedure.

## 2024-11-19 ENCOUNTER — Encounter: Payer: Self-pay | Admitting: Cardiology

## 2024-11-27 ENCOUNTER — Ambulatory Visit (HOSPITAL_COMMUNITY)
Admission: RE | Admit: 2024-11-27 | Discharge: 2024-11-27 | Disposition: A | Source: Ambulatory Visit | Attending: Cardiovascular Disease | Admitting: Cardiovascular Disease

## 2024-11-27 DIAGNOSIS — I480 Paroxysmal atrial fibrillation: Secondary | ICD-10-CM

## 2024-11-27 MED ORDER — IOHEXOL 350 MG/ML SOLN
80.0000 mL | Freq: Once | INTRAVENOUS | Status: AC | PRN
  Administered 2024-11-27: 80 mL via INTRAVENOUS

## 2024-11-28 LAB — BASIC METABOLIC PANEL WITH GFR
BUN/Creatinine Ratio: 17 (ref 10–24)
BUN: 15 mg/dL (ref 8–27)
CO2: 23 mmol/L (ref 20–29)
Calcium: 9.1 mg/dL (ref 8.6–10.2)
Chloride: 103 mmol/L (ref 96–106)
Creatinine, Ser: 0.87 mg/dL (ref 0.76–1.27)
Glucose: 82 mg/dL (ref 70–99)
Potassium: 4.2 mmol/L (ref 3.5–5.2)
Sodium: 141 mmol/L (ref 134–144)
eGFR: 92 mL/min/{1.73_m2}

## 2024-11-28 LAB — CBC
Hematocrit: 43.6 % (ref 37.5–51.0)
Hemoglobin: 14.7 g/dL (ref 13.0–17.7)
MCH: 34.1 pg — ABNORMAL HIGH (ref 26.6–33.0)
MCHC: 33.7 g/dL (ref 31.5–35.7)
MCV: 101 fL — ABNORMAL HIGH (ref 79–97)
Platelets: 286 10*3/uL (ref 150–450)
RBC: 4.31 x10E6/uL (ref 4.14–5.80)
RDW: 12.7 % (ref 11.6–15.4)
WBC: 8.1 10*3/uL (ref 3.4–10.8)

## 2024-12-18 ENCOUNTER — Encounter (HOSPITAL_COMMUNITY): Payer: Self-pay

## 2024-12-18 ENCOUNTER — Ambulatory Visit (HOSPITAL_COMMUNITY): Admit: 2024-12-18 | Admitting: Cardiovascular Disease

## 2024-12-18 SURGERY — ATRIAL FIBRILLATION ABLATION
Anesthesia: General

## 2025-01-14 ENCOUNTER — Encounter

## 2025-01-14 ENCOUNTER — Ambulatory Visit

## 2025-05-11 ENCOUNTER — Ambulatory Visit: Admitting: Neurology
